# Patient Record
Sex: Female | Born: 1935
Health system: Southern US, Community
[De-identification: ages and names within clinical notes are randomized; demographics above are authoritative.]

## PROBLEM LIST (undated history)

## (undated) DIAGNOSIS — I10 Essential (primary) hypertension: Secondary | ICD-10-CM

## (undated) DIAGNOSIS — E039 Hypothyroidism, unspecified: Secondary | ICD-10-CM

## (undated) DIAGNOSIS — I639 Cerebral infarction, unspecified: Secondary | ICD-10-CM

## (undated) DIAGNOSIS — E785 Hyperlipidemia, unspecified: Secondary | ICD-10-CM

## (undated) DIAGNOSIS — F0390 Unspecified dementia without behavioral disturbance: Secondary | ICD-10-CM

## (undated) DIAGNOSIS — I4891 Unspecified atrial fibrillation: Secondary | ICD-10-CM

## (undated) DIAGNOSIS — H547 Unspecified visual loss: Secondary | ICD-10-CM

## (undated) DIAGNOSIS — S8990XA Unspecified injury of unspecified lower leg, initial encounter: Secondary | ICD-10-CM

## (undated) DIAGNOSIS — I6529 Occlusion and stenosis of unspecified carotid artery: Secondary | ICD-10-CM

## (undated) DIAGNOSIS — C801 Malignant (primary) neoplasm, unspecified: Secondary | ICD-10-CM

## (undated) DIAGNOSIS — K219 Gastro-esophageal reflux disease without esophagitis: Secondary | ICD-10-CM

## (undated) DIAGNOSIS — I4821 Permanent atrial fibrillation: Secondary | ICD-10-CM

## (undated) HISTORY — DX: Malignant (primary) neoplasm, unspecified: C80.1

## (undated) HISTORY — DX: Occlusion and stenosis of unspecified carotid artery: I65.29

## (undated) HISTORY — DX: Essential (primary) hypertension: I10

## (undated) HISTORY — DX: Unspecified visual loss: H54.7

## (undated) HISTORY — PX: ABDOMINAL HYSTERECTOMY: SHX81

## (undated) HISTORY — DX: Unspecified injury of unspecified lower leg, initial encounter: S89.90XA

## (undated) HISTORY — DX: Cerebral infarction, unspecified: I63.9

## (undated) HISTORY — DX: Hypothyroidism, unspecified: E03.9

## (undated) HISTORY — PX: MASTECTOMY, PARTIAL: SHX709

## (undated) HISTORY — DX: Hyperlipidemia, unspecified: E78.5

## (undated) HISTORY — DX: Gastro-esophageal reflux disease without esophagitis: K21.9

---

## 1979-08-16 HISTORY — PX: ROTATOR CUFF REPAIR: SHX139

## 2005-01-28 ENCOUNTER — Ambulatory Visit: Payer: Self-pay | Admitting: Internal Medicine

## 2005-02-04 ENCOUNTER — Ambulatory Visit: Payer: Self-pay | Admitting: Internal Medicine

## 2005-02-04 ENCOUNTER — Ambulatory Visit: Payer: Self-pay | Admitting: Pulmonary Disease

## 2005-02-13 ENCOUNTER — Emergency Department (HOSPITAL_COMMUNITY): Admission: EM | Admit: 2005-02-13 | Discharge: 2005-02-13 | Payer: Self-pay | Admitting: Emergency Medicine

## 2005-02-18 ENCOUNTER — Ambulatory Visit: Payer: Self-pay | Admitting: Cardiology

## 2005-02-18 ENCOUNTER — Ambulatory Visit: Payer: Self-pay | Admitting: Internal Medicine

## 2005-02-23 ENCOUNTER — Ambulatory Visit: Payer: Self-pay | Admitting: Cardiology

## 2005-02-23 ENCOUNTER — Ambulatory Visit: Payer: Self-pay

## 2005-03-02 ENCOUNTER — Ambulatory Visit: Payer: Self-pay | Admitting: Pulmonary Disease

## 2005-03-08 ENCOUNTER — Ambulatory Visit: Payer: Self-pay | Admitting: Pulmonary Disease

## 2005-04-07 ENCOUNTER — Ambulatory Visit: Payer: Self-pay | Admitting: Cardiology

## 2005-04-08 ENCOUNTER — Ambulatory Visit: Payer: Self-pay | Admitting: Cardiology

## 2005-04-08 ENCOUNTER — Ambulatory Visit: Payer: Self-pay | Admitting: Internal Medicine

## 2005-05-20 ENCOUNTER — Ambulatory Visit: Payer: Self-pay | Admitting: Internal Medicine

## 2005-06-03 ENCOUNTER — Ambulatory Visit: Payer: Self-pay | Admitting: Internal Medicine

## 2005-08-09 ENCOUNTER — Ambulatory Visit: Payer: Self-pay | Admitting: Internal Medicine

## 2005-09-02 ENCOUNTER — Ambulatory Visit: Payer: Self-pay | Admitting: Internal Medicine

## 2005-12-14 ENCOUNTER — Ambulatory Visit: Payer: Self-pay | Admitting: Internal Medicine

## 2006-03-01 ENCOUNTER — Ambulatory Visit: Payer: Self-pay | Admitting: Internal Medicine

## 2006-03-24 ENCOUNTER — Ambulatory Visit: Payer: Self-pay | Admitting: Cardiology

## 2006-03-24 ENCOUNTER — Ambulatory Visit: Payer: Self-pay

## 2007-09-25 DIAGNOSIS — E785 Hyperlipidemia, unspecified: Secondary | ICD-10-CM | POA: Insufficient documentation

## 2007-09-25 DIAGNOSIS — I10 Essential (primary) hypertension: Secondary | ICD-10-CM

## 2007-09-25 DIAGNOSIS — I2510 Atherosclerotic heart disease of native coronary artery without angina pectoris: Secondary | ICD-10-CM | POA: Insufficient documentation

## 2008-08-01 DIAGNOSIS — R4182 Altered mental status, unspecified: Secondary | ICD-10-CM

## 2008-08-04 ENCOUNTER — Telehealth: Payer: Self-pay | Admitting: Gastroenterology

## 2008-08-05 ENCOUNTER — Ambulatory Visit: Payer: Self-pay | Admitting: Gastroenterology

## 2008-08-05 ENCOUNTER — Inpatient Hospital Stay (HOSPITAL_COMMUNITY): Admission: EM | Admit: 2008-08-05 | Discharge: 2008-08-09 | Payer: Self-pay | Admitting: Emergency Medicine

## 2008-08-05 DIAGNOSIS — I959 Hypotension, unspecified: Secondary | ICD-10-CM | POA: Insufficient documentation

## 2008-08-05 DIAGNOSIS — R1319 Other dysphagia: Secondary | ICD-10-CM

## 2008-08-06 ENCOUNTER — Ambulatory Visit: Payer: Self-pay | Admitting: Gastroenterology

## 2008-08-09 ENCOUNTER — Encounter (INDEPENDENT_AMBULATORY_CARE_PROVIDER_SITE_OTHER): Payer: Self-pay | Admitting: *Deleted

## 2008-08-11 ENCOUNTER — Telehealth: Payer: Self-pay | Admitting: Gastroenterology

## 2008-08-20 ENCOUNTER — Ambulatory Visit: Payer: Self-pay | Admitting: Gastroenterology

## 2008-10-29 ENCOUNTER — Encounter: Payer: Self-pay | Admitting: Adult Health

## 2009-09-28 ENCOUNTER — Encounter: Admission: RE | Admit: 2009-09-28 | Discharge: 2009-09-28 | Payer: Self-pay | Admitting: Endocrinology

## 2009-10-23 ENCOUNTER — Encounter: Admission: RE | Admit: 2009-10-23 | Discharge: 2009-10-23 | Payer: Self-pay | Admitting: Endocrinology

## 2010-03-01 ENCOUNTER — Emergency Department (HOSPITAL_COMMUNITY): Admission: EM | Admit: 2010-03-01 | Discharge: 2010-03-01 | Payer: Self-pay | Admitting: Emergency Medicine

## 2010-03-09 ENCOUNTER — Ambulatory Visit: Payer: Self-pay | Admitting: Vascular Surgery

## 2010-03-11 ENCOUNTER — Ambulatory Visit: Payer: Self-pay | Admitting: Vascular Surgery

## 2010-03-11 ENCOUNTER — Encounter: Admission: RE | Admit: 2010-03-11 | Discharge: 2010-03-11 | Payer: Self-pay | Admitting: Vascular Surgery

## 2010-03-15 ENCOUNTER — Ambulatory Visit (HOSPITAL_COMMUNITY): Admission: RE | Admit: 2010-03-15 | Discharge: 2010-03-15 | Payer: Self-pay | Admitting: Vascular Surgery

## 2010-03-15 ENCOUNTER — Ambulatory Visit: Payer: Self-pay | Admitting: Vascular Surgery

## 2010-03-15 HISTORY — PX: OTHER SURGICAL HISTORY: SHX169

## 2010-04-08 ENCOUNTER — Encounter: Admission: RE | Admit: 2010-04-08 | Discharge: 2010-04-08 | Payer: Self-pay | Admitting: Endocrinology

## 2010-04-14 ENCOUNTER — Ambulatory Visit: Payer: Self-pay | Admitting: Vascular Surgery

## 2010-09-05 ENCOUNTER — Encounter: Payer: Self-pay | Admitting: Endocrinology

## 2010-09-05 ENCOUNTER — Encounter: Payer: Self-pay | Admitting: Vascular Surgery

## 2010-09-13 ENCOUNTER — Other Ambulatory Visit: Payer: Self-pay | Admitting: Endocrinology

## 2010-09-13 DIAGNOSIS — Z1239 Encounter for other screening for malignant neoplasm of breast: Secondary | ICD-10-CM

## 2010-09-22 ENCOUNTER — Other Ambulatory Visit: Payer: Self-pay

## 2010-09-22 ENCOUNTER — Ambulatory Visit (INDEPENDENT_AMBULATORY_CARE_PROVIDER_SITE_OTHER): Payer: Medicaid Other | Admitting: Vascular Surgery

## 2010-09-22 DIAGNOSIS — I6529 Occlusion and stenosis of unspecified carotid artery: Secondary | ICD-10-CM

## 2010-09-24 NOTE — Assessment & Plan Note (Signed)
OFFICE VISIT  Hill, Joanna Hill DOB:  08/18/1931                                       09/22/2010 [id number]#:[id number]  I saw the patient in the office today for continued follow-up of her carotid disease.  This is a pleasant 77 year old woman who I had seen with a moderate right carotid stenosis in July 2011.  She had a previous left hemispheric stroke in 2009 associated with right-sided weakness. She underwent a cerebral arteriogram on 03/15/2010 which showed only 30% fairly smooth right proximal internal carotid artery stenosis overlying a length of approximately 2-3 cm.  There was no significant left internal carotid artery disease and both vertebral arteries were patent. I did not think carotid endarterectomy was recommended given that this was fairly mild stenosis.  Since I saw her last she has had no new neurologic symptoms.  She has persistent expressive aphasia which was her presenting symptom in 02/2010.  She is ambulatory with assistance. She had no new weakness or paresthesias.  She does complain of some occasional headaches.  She had tried what sounds like acupuncture for this which helped temporarily.  SOCIAL HISTORY:  She is widowed.  She has three children.  She does not use tobacco.  REVIEW OF SYSTEMS:  CARDIOVASCULAR:  She had no chest pain, chest pressure, palpitations or arrhythmias. PULMONARY:  She had no productive cough bronchitis, asthma or wheezing.  PHYSICAL EXAMINATION:  This is a pleasant 77 year old woman who appears her stated age.  Heart rate is 78.  Blood pressure 102/66, saturation 95%.  Lungs:  Clear bilaterally to auscultation without rales, rhonchi or wheezing.  Cardiovascular:  I do not detect any carotid bruits.  She has a regular rate and rhythm.  Both feet are warm and well-perfused. She has mild bilateral lower extremity swelling.  Abdomen:  Soft and nontender with normal pitched bowel sounds.  Neurologic:  She has  mild right upper extremity and right lower extremity weakness which has been stable.  She has persistent expressive aphasia.  Skin:  There are no ulcers or rashes.  I have independently interpreted her carotid duplex scan which shows a 40%-59% right carotid stenosis with no significant stenosis on the left. Both vertebral arteries are patent with normally directed flow.  I have compared her velocities today to the velocities from 02/2010 on duplex and there has been no significant change on the right.  I have explained I would not consider carotid endarterectomy unless the stenosis progressed to greater than 80% and will simply continue to follow her with routine duplex scans.  I will plan on seeing her back in 1 year.  She knows to call sooner if she has problems.    Di Kindle. Edilia Bo, M.D. Electronically Signed  CSD/MEDQ  D:  09/22/2010  T:  09/23/2010  Job:  [id number]  cc:   Melvyn Novas, M.D. Tera Mater. Evlyn Kanner, M.D.

## 2010-09-29 ENCOUNTER — Ambulatory Visit
Admission: RE | Admit: 2010-09-29 | Discharge: 2010-09-29 | Disposition: A | Discharge status: Home / Self Care | Payer: Medicare Other | Source: Ambulatory Visit | Attending: Endocrinology | Admitting: Endocrinology

## 2010-09-29 DIAGNOSIS — Z1239 Encounter for other screening for malignant neoplasm of breast: Secondary | ICD-10-CM

## 2010-09-29 NOTE — Procedures (Unsigned)
CAROTID DUPLEX EXAM  INDICATION:  Carotid stenosis.  HISTORY: Diabetes:  Yes. Cardiac:  Murmur, atrial fibrillation. Hypertension:  Yes. Smoking:  No. Previous Surgery:  No. CV History:  CVA 02/2008, possible CVA 02/2010. Amaurosis Fugax No, Paresthesias No, Hemiparesis No.                                      RIGHT             LEFT Brachial systolic pressure:         Not taken, per patient              107 Brachial Doppler waveforms:                           Normal Vertebral direction of flow:        Antegrade         Antegrade DUPLEX VELOCITIES (cm/sec) CCA peak systolic                   43                50 ECA peak systolic                   66                47 ICA peak systolic                   180               45 ICA end diastolic                   47                20 PLAQUE MORPHOLOGY:                  Heterogenous      Heterogenous PLAQUE AMOUNT:                      Moderate          Mild PLAQUE LOCATION:                    ICA               Bulb  IMPRESSION: 1. Right internal carotid artery velocities suggest 40% to 59%     stenosis. 2. Left internal carotid artery velocities suggest 1% to 39% stenosis. 3. Antegrade flow in bilateral vertebral arteries.  ___________________________________________ Di Kindle. Edilia Bo, M.D.  EM/MEDQ  D:  09/22/2010  T:  09/22/2010  Job:  [id number]

## 2010-10-13 ENCOUNTER — Other Ambulatory Visit: Payer: Self-pay

## 2010-10-13 ENCOUNTER — Ambulatory Visit: Payer: Self-pay | Admitting: Vascular Surgery

## 2010-10-29 LAB — POCT I-STAT, CHEM 8
Calcium, Ion: 1.19 mmol/L (ref 1.12–1.32)
HCT: 40 % (ref 36.0–46.0)
Hemoglobin: 13.6 g/dL (ref 12.0–15.0)
Sodium: 143 mEq/L (ref 135–145)
TCO2: 26 mmol/L (ref 0–100)

## 2010-10-29 LAB — GLUCOSE, CAPILLARY
Glucose-Capillary: 145 mg/dL — ABNORMAL HIGH (ref 70–99)
Glucose-Capillary: 94 mg/dL (ref 70–99)

## 2010-10-29 LAB — PROTIME-INR
INR: 1.43 (ref 0.00–1.49)
Prothrombin Time: 17.3 seconds — ABNORMAL HIGH (ref 11.6–15.2)

## 2010-10-29 LAB — APTT: aPTT: 29 seconds (ref 24–37)

## 2010-10-30 LAB — COMPREHENSIVE METABOLIC PANEL
AST: 19 U/L (ref 0–37)
Albumin: 4.2 g/dL (ref 3.5–5.2)
BUN: 21 mg/dL (ref 6–23)
CO2: 28 mEq/L (ref 19–32)
Calcium: 9.5 mg/dL (ref 8.4–10.5)
Creatinine, Ser: 0.94 mg/dL (ref 0.4–1.2)
GFR calc Af Amer: >60 mL/min (ref >60)
GFR calc non Af Amer: 58 mL/min — ABNORMAL LOW (ref >60)
Total Bilirubin: 0.8 mg/dL (ref 0.3–1.2)

## 2010-10-30 LAB — DIFFERENTIAL
Basophils Absolute: 0 10*3/uL (ref 0.0–0.1)
Eosinophils Relative: 3 % (ref 0–5)
Lymphocytes Relative: 19 % (ref 12–46)
Lymphs Abs: 1.4 10*3/uL (ref 0.7–4.0)
Neutro Abs: 5 10*3/uL (ref 1.7–7.7)

## 2010-10-30 LAB — APTT: aPTT: 32 seconds (ref 24–37)

## 2010-10-30 LAB — URINALYSIS, ROUTINE W REFLEX MICROSCOPIC
Bilirubin Urine: NEGATIVE
Ketones, ur: NEGATIVE mg/dL
Leukocytes, UA: NEGATIVE
Nitrite: NEGATIVE
Protein, ur: NEGATIVE mg/dL
Urobilinogen, UA: 0.2 mg/dL (ref 0.0–1.0)
pH: 6 (ref 5.0–8.0)

## 2010-10-30 LAB — URINE CULTURE
Colony Count: NO GROWTH
Culture: NO GROWTH

## 2010-10-30 LAB — CBC
Platelets: 183 10*3/uL (ref 150–400)
RBC: 4.3 MIL/uL (ref 3.87–5.11)
RDW: 16.7 % — ABNORMAL HIGH (ref 11.5–15.5)
WBC: 7.2 10*3/uL (ref 4.0–10.5)

## 2010-10-30 LAB — TROPONIN I: Troponin I: 0.02 ng/mL (ref 0.00–0.06)

## 2010-10-30 LAB — PROTIME-INR
INR: 1.87 — ABNORMAL HIGH (ref 0.00–1.49)
Prothrombin Time: 21.4 seconds — ABNORMAL HIGH (ref 11.6–15.2)

## 2010-10-30 LAB — URINE MICROSCOPIC-ADD ON

## 2010-10-30 LAB — CK TOTAL AND CKMB (NOT AT ARMC)
CK, MB: 0.8 ng/mL (ref 0.3–4.0)
Relative Index: INVALID (ref 0.0–2.5)

## 2010-12-28 NOTE — Procedures (Signed)
CAROTID DUPLEX EXAM   INDICATION:  Droop in left face, slurred speech.   HISTORY:  Diabetes:  Yes.  Cardiac:  Murmur, A fib.  Hypertension:  Yes.  Smoking:  No.  Previous Surgery:  No.  CV History:  CVA in July 2009, questionable CVA present.  Amaurosis Fugax No, Paresthesias No, Hemiparesis Yes.                                       RIGHT             LEFT  Brachial systolic pressure:         Not taken, per patient              112  Brachial Doppler waveforms:                           WNL  Vertebral direction of flow:        Antegrade         Antegrade  DUPLEX VELOCITIES (cm/sec)  CCA peak systolic                   57                60  ECA peak systolic                   71                91  ICA peak systolic                   168               54  ICA end diastolic                   56                18  PLAQUE MORPHOLOGY:                  Heterogenous      Heterogenous  PLAQUE AMOUNT:                      Moderate          Mild  PLAQUE LOCATION:                    ICA               Bulb   IMPRESSION:  1. Right internal carotid artery suggests 40% to 59% stenosis.  2. Left internal carotid artery suggests 1% to 39% stenosis.  3. Antegrade flow in bilateral vertebrals.   ___________________________________________  Di Kindle. Edilia Bo, M.D.   CB/MEDQ  D:  03/09/2010  T:  03/09/2010  Job:  [id number]

## 2010-12-28 NOTE — Assessment & Plan Note (Signed)
OFFICE VISIT   Hill, Joanna N  DOB:  1931/10/04                                       04/14/2010  EAVWU#:[id number]   I saw the patient in the office today for followup after her recent  cerebral arteriogram.  This is a pleasant 77 year old woman who I had  seen in consultation on 03/11/2010 with a moderate right carotid  stenosis.  She had had a previous left hemispheric stroke 2 years ago  associated with right-sided weakness.  She was having problems with  expressive aphasia and some left facial droop.  She had been at [facility] in July and was evaluated by Dr. Vickey Huger at that time.  CT  scan at that time showed an old left middle cerebral artery stroke but  no acute changes.  Carotid duplex scan suggested a moderate 40%-59%  right carotid stenosis.  I had recommended cerebral arteriography to  further evaluate the right carotid stenosis and to consider right  carotid endarterectomy.   She had her cerebral arteriogram on 03/15/2010.  I found a mild 30%  right proximal internal carotid artery stenosis over a length of 2-3 cm.  There was no left internal carotid artery stenosis and the vertebral  arteries were both patent.  I did not think that the stenosis on the  right warranted consideration for carotid endarterectomy.  She comes in  for a followup visit.  She states that she has been having persistent  left-sided headaches now for several weeks.  She has had no associated  nausea and vomiting.  The headache symptoms have not changed in  character over the last 2 weeks.   REVIEW OF SYSTEMS:  She has had no chest pain, chest pressure,  palpitations or arrhythmias.   PHYSICAL EXAMINATION:  General:  This is a pleasant 77 year old woman  who appears her stated age.  Vital signs:  Blood pressure is 94/63,  heart rate is 96, temperature is 98.4.  Lungs:  Are clear bilaterally to  auscultation.  Cardiovascular:  I do not detect any carotid bruits.   She  has an irregular rhythm.  Neurologic:  She has some expressive aphasia.  She is nonambulatory.  Her cath site on the right looks fine with no  hematoma.   I have reviewed results of her cerebral arteriogram with her.  I  explained that I did not think the mild stenosis on the right is  significant at this point.  I have recommended a followup carotid duplex  scan in 6 months and I have ordered this test.  Given her persistent  headaches we will try to get her an appointment to see Dr. Vickey Huger to  consider further workup for this.  I will plan on seeing her back in 6  months.     Di Kindle. Edilia Bo, M.D.  Electronically Signed   CSD/MEDQ  D:  04/14/2010  T:  04/15/2010  Job:  [id number]   cc:   Melvyn Novas, M.D.  Tera Mater. Evlyn Kanner, M.D.

## 2010-12-28 NOTE — H&P (Signed)
NAME:  Joanna Hill, Joanna Hill NO.:  [id number]   MEDICAL RECORD NO.:  [id number]          PATIENT TYPE:  INP   LOCATION:  1402                         FACILITY:  [facility]   PHYSICIAN:  Geoffry Paradise, M.D.  DATE OF BIRTH:  [birth year]   DATE OF ADMISSION:  08/05/2008  DATE OF DISCHARGE:                              HISTORY & PHYSICAL   CHIEF COMPLAINT:  Sent over from [facility]; with low blood pressure and  mental status changes.   HISTORY OF PRESENT ILLNESS:  Joanna Hill is a 77 year old white female who  has been a resident at [facility] since  the summer, after having sustained a left MCA infarction, for rehab.  She has multiple medical illnesses to include: chronic atrial  fibrillation and DVT on chronic Coumadin, hypertension, diabetes  mellitus type 2, hypothyroidism, hyperlipidemia, osteoporosis and  depression.  She has had outstanding progress at [facility], to the  extent that she is semi-ambulatory, quite happy there and very active in  all rehab restorative and activity modalities.  She evidently has been  in baseline excellent health, seen daily by her 3 children.  Some time 7-  10 days ago she began having more pain, particularly her mid back; which  has been present since her stroke and fall, as well as some left neck  and head -- resulting in the initiation of some Duragesic 25 mcg.  Her  children said she tolerated this quite well, but this was discontinued 1  day prior because of questionable mental status changes.  Additionally,  over the weekend she began having progressive dysphagia and odynophagia,  both with solids and liquids; but able to take in liquids and some fluid  well and control her own saliva.  She was expeditiously seen by   GI today, but upon presenting to see Dr. Christella Hartigan she was found to have a  systolic pressure in the 80s transiently; and with this in conjunction  with the history of possible mental  status changes was sent to the  emergency room.  Upon arrival in the emergency room her blood pressures  ranged at 105/54 to 140s/90s with no hypotension.  Upon arrival in the  emergency room as well she has mentally totally clear; but the GI  symptoms continue with some questionable poor p.o. intake.  She is  admitted at this time for further GI evaluation, IV fluids and further  evaluation of possible hypotension/mental status changes.   PAST MEDICAL HISTORY:   ALLERGIES:  NO KNOWN DRUG ALLERGIES.   MEDICATIONS:  1. NovoLog sliding scale a.c. only at the facility; 2 units for 120-      150, 4 units anything over 151.  2. Levemir 20 units subcutaneous at bedtime.  3. Tylenol 650 p.o. q.4 h. p.r.n. pain.  4. Duragesic she was on has been discontinued.  She had only received      2 patches total.  5. Fosamax 70 mg weekly on Sunday.  6. Norvasc 10 mg daily.  7. Atacand 32 mg daily.  8. Lasix 20 p.o. daily.  9. Lexapro 20 p.o. daily.  10.Lopressor 25 mg three p.o. in the morning and four p.o. at night.  11.Os-Cal D one daily.  12.Provigil 100 mg daily.  This was discontinued by Dr. Evlyn Kanner several      weeks ago.  13.Synthroid 50 mcg daily.  14.Coumadin per pharmacy protocol, most recently 2.5 mg daily.  15.Peridex oral rinse twice daily.  16.Lipitor 20 mg daily.   DIET:  Is a regular.  Thin liquids; no concentrated sweets.   MEDICAL ILLNESSES:  Include:  1. Hypothyroidism.  2. Essential hypertension.  3. Diabetes mellitus type 2.  4. Atrial fibrillation, on chronic Coumadin.  5. Hyperlipidemia.  6. Osteoporosis.  7. Chronic depression.  8. DVT.  9. She additionally has carotid disease; right 60-79% , left was      clear.   She has had an echocardiogram revealing pulmonary hypertension with EF  50%.   PHYSICAL EXAMINATION:  Temperature 98, blood pressure at this time is  143/96, pulse 80 irregular, respiratory rate 20, O2 saturation 98% on  room air.  The patient is  delightful, awake, alert, no distress,  smiling.  HEENT:  Anicteric.  Mild facial asymmetry right droop, lower face only.  Oropharynx benign.  NECK:  No JVD or bruits.  LUNGS:  Clear to auscultation and percussion.  CARDIOVASCULAR:  Irregular rate and rhythm.  No murmur, gallop, rub or  heave.  ABDOMEN:  Soft, nontender.  No hepatosplenomegaly.  EXTREMITIES:  No cyanosis, clubbing.  Trace edema of right upper  extremity and right lower extremity.  Intact distal pulses.  Joints  normal.  NEUROLOGICAL:  She is awake, alert, oriented x4 tonight; bright  and interactive.  She does have a mild right hemiparesis, 4/5; left  intact.  Gait not assessed.   DATA:  Chest x-ray, no acute disease.  CT brain:  Atrophy, no bleed.  Chemistries, CBC and urine baseline.   ASSESSMENT:  1. Subacute/acute GI symptoms, dysphagia and odynophagia:  Primary      considerations would be a candidal esophagitis or acid peptic      esophagitis/gastritis.  Remote possibility of malignancy exists  2. Transient hypotension/mental status changes:  No evidence of      infection or new CNS event.  I believe this may indeed be more      vasovagal and/or narcotic related.  3. Chronic atrial fibrillation:  Controlled response on chronic      Coumadin.  4. Hypertension.  5. Diabetes mellitus type 2.  6. Chronic depression.   PLAN:  The patient be admitted and hydrated.  GI will be consulted in  the morning.  I will order a barium swallow with upper GI series, and  will empirically treat with Magic mouthwash and proton pump inhibitors.  I am transiently holding Coumadin, pending the need for endoscopy and or  dilatation further pending her course.           ______________________________  Geoffry Paradise, M.D.     RA/MEDQ  D:  08/05/2008  T:  08/05/2008  Job:  [id number]   cc:   Rachael Fee, MD  [address]  [place], [place] [address]

## 2010-12-28 NOTE — Discharge Summary (Signed)
NAME:  Joanna Hill, Joanna Hill NO.:  [id number]   MEDICAL RECORD NO.:  [id number]          PATIENT TYPE:  INP   LOCATION:  1402                         FACILITY:  [facility]   PHYSICIAN:  Geoffry Paradise, M.D.  DATE OF BIRTH:  1932-01-17   DATE OF ADMISSION:  08/05/2008  DATE OF DISCHARGE:  08/09/2008                               DISCHARGE SUMMARY   DISCHARGE DIAGNOSES:  1. Esophagitis distal esophagus, acid peptic versus Candida.  2. Poor p.o. intake secondary to #1.  3. Transient hypotension secondary to vasovagal spell with      transportation.  4. Mental status changes secondary to narcotic, cleared.  5. Diabetes mellitus type 2, insulin-dependent.  6. Chronic atrial fibrillation on Coumadin.  7. Primary hypothyroidism.  8. Hyperlipidemia.  9. Osteoporosis with lumbar compression fractures, most likely dating      back to her stroke this past summer.   HISTORY OF PRESENT ILLNESS:  Joanna Hill is a 77 year old white female,  resident of [facility], who has chronic A  fib on Coumadin, left MCA infarct with right hemiparesis dating back to  summer, chronic back pain since summer, diabetes mellitus type 2,  hypothyroidism, hyperlipidemia presenting at this time sent over from  [facility] with low blood pressure and mental status changes.  Her main  acute event was several days of dysphasia leading up to poor p.o. intake  and a resultant [facility] consultation which was really fairly timely  after only several days of symptoms.  Concomitant with this was some  mental status changes which seemed to correspond to the institution of a  low dose Duragesic patch because of some left neck and facial pain, as  well as back pain which dates back several months.  Patch had already  been removed and upon presentation to the  GI, she had some  transient hypotension with systolic blood pressure in the 80s.  Blood  pressure at the facility prior to  transportation was in the low 100  range, which is her baseline and blood pressure in the emergency room  was normal.  Upon my evaluation in the emergency room she was clear and  appropriate.  She clearly has significant odynophagia and dysphagia with  poor p.o. intake and is admitted for further management.  For details,  see the dictated summary in the chart.   DATA:  Lumbar spine series reveals, as well as thoracic spine series  reveals compression fractures L1-L3.  CT scan of the brain, posterior  left frontal, left parietal encephalomalacia.  No acute features.  Atrophy and chronic ischemic changes.  Chest x-ray:  No acute  cardiopulmonary disease.  Her INR was 1.9 prior to discharge.  CBCs have  been ranging mid to upper 100s.  Chemistry:  Sodium 141, potassium 4.2,  chloride 103, CO2 of 31, glucose 137, BUN 24, creatinine 0.97.  Liver  function testing was completely normal.  Ammonia was normal at 16.  Urinalysis was negative.  Brushings of the esophagus revealed no yeast  or fungus.  CBC:  Hemoglobin 12.7, hematocrit 39,  white blood cell count  8.6, platelet count 184,000.   PLAN:  The patient was admitted, hydrated.  From a mental status  standpoint and cardiopulmonary standpoint, all hemodynamics remained  stable and were stable upon presentation.  Duragesic patch was obviously  not continued and Tylenol used only for pain.  GI was consulted and she  did undergo upper GI endoscopy which was performed without difficulty on  August 06, 2008.  This revealed esophagitis, distal esophagus.  Brushings and washes were negative.  She was treated empirically with  proton pump inhibitors, Magic mouth wash and Diflucan and has been  gradually improving, thus she has been eating 50-70% of all p.o. intake.  She is bright, alert, stable blood pressure, INR therapeutic.  She and  family wish transportation back to the facility.   DIET:  Regular no concentrated sweet diet.    MEDICATIONS:  1. NovoLog insulin sliding scale as before at the facility, 2 units      a.c. 120-150, four units anything over 151.  Levemir has been      lowered to 15 units nightly.  2. Tylenol 650 p.o. q.4 h., p.r.n. pain.  3. Norvasc 10 mg daily.  4. Atacand 32 mg daily.  5. Lasix 20 mg daily.  6. Lexapro 20 mg daily.  7. Os-Cal D 1 daily.  8. Synthroid 50 mcg daily.  9. Coumadin per pharmacy protocol 2.5 daily.  10.Lipitor 20 mg daily.  11.Bystolic 10 mg daily is new.  This has been substituted for the      Lopressor because of some drowsiness in hopes of avoiding further      Provigil.  She is not on Provigil as well and the Fosamax has been      held and discontinued as well.  12.Magic mouth wash 10 mL q.i.d. x5 days.  13.Diflucan 100 daily x7 days.  14.Omeprazole 20 mg b.i.d. x1 month and daily thereafter.   She will require OT and PT consults.  She is discharged in improved and  stable condition.  The only acute finding was the esophagitis.  I  believe that the transient drop in blood pressure was related to  transportation and a vasovagal episode.  No underlying cardiac or  infectious disease etiology was found.  Mental status changes stated  above were probably related to the Duragesic patch and family I have  agreed on trying Tylenol alone at this point as she is overly sensitive  to narcotics.  She is not a candidate for vertebroplasty as symptoms are  mild at best.           ______________________________  Geoffry Paradise, M.D.     RA/MEDQ  D:  08/09/2008  T:  08/09/2008  Job:  [id number]

## 2010-12-28 NOTE — Consult Note (Signed)
NEW PATIENT CONSULTATION   Joanna Hill, Joanna Hill  DOB:  10-Aug-1932                                       03/11/2010  [id number]#:[id number]   I saw the patient in the office today in consultation concerning a  moderate right carotid stenosis.  She was referred by Dr. Evlyn Kanner.  This  is a pleasant 76 year old right-handed woman who sustained a left  hemispheric stroke 2 years ago which was associated with significant  right-sided weakness.  This weakness has gradually improved over the  last 2 years but has not completely resolved.  She states that she has  been nonambulatory since her stroke 2 years ago because of poor  coordination with her right leg.  She had been doing well otherwise  until approximately 2 weeks ago when she noted the gradual onset of a  left occipital headache.  The pain is of moderate severity and comes and  goes.  There are no aggravating factors and no alleviating factors.  Over the last 2 weeks the pain has not changed significantly in  character.  She has no associated symptoms.  Specifically she has had no  nausea or vomiting or change in vision.  She denies any history of  amaurosis fugax.  The patient had actually been seen at [facility] on  July 18 and was evaluated by Dr. Vickey Huger at that time.  The CT scan of  the head at that time showed an old left middle cerebral artery stroke  but no acute changes.  There was possibly, however, a ring enhancing  lesion noted by Dr. Vickey Huger in the central occipital lobe and on the  right posterior temporal lobe.  This prompted an MRI of the head which  showed no acute abnormality.  There was some atrophy and chronic  microvascular ischemia.  She also underwent an EEG.  This study showed  asymmetry in the occipital dominant rhythm, left versus right as noted  by Dr. Vickey Huger.  This asymmetry would be considered abnormal and could  result from an occipital hematoma or stroke.   She was sent to our office 2  days ago and had a carotid duplex scan  which showed a moderate right carotid stenosis and we were asked to  evaluate her concerning these results.   This patient in addition to the headache on the left has noted slurred  speech which began 2 weeks ago at the same time.  This has been  constant.  In addition she has noted some left facial drooping which has  been constant.   Her past medical history is significant for previous stroke as described  above.  In addition she has a history of diabetes, hypertension,  hypercholesterolemia and chronic atrial fibrillation.  She denies any  history of previous myocardial infarction or history of congestive heart  failure or COPD.   SOCIAL HISTORY:  She is widowed.  She has three children.  She does not  use tobacco.   REVIEW OF SYSTEMS:  CARDIOVASCULAR:  She has had no chest pain or chest  pressure.  She does admit to orthopnea and dyspnea on exertion.  She has  atrial fibrillation.  She is nonambulatory.  She has had no history of  nonhealing wounds in her feet.  GI:  She has had problems swallowing since her stroke 2 years ago.  NEUROLOGIC:  She has had headaches in the left occipital region as  described above.  She has had no dizziness, blackouts or seizures.  General, pulmonary, hematologic, urinary, ENT, musculoskeletal,  psychiatric, integumentary review of systems is unremarkable and is  documented on the medical history form in the chart.   CURRENT MEDICATIONS:  Include Norvasc, Bystolic, Diovan, Lasix,  Synthroid, Lexapro, Lidoderm patch, Prilosec, Singulair, K-Dur, Os-Cal  and Coumadin.   PHYSICAL EXAMINATION:  General:  This is a pleasant 77 year old woman  who appears her stated age.  Vital signs:  Her blood pressure is 116/68,  heart rate is 86, respiratory rate 16.  HEENT:  Pupils are equal, round  and reactive to light and accommodation.  Extraocular motions are  intact.  She has some facial drooping on the left side.   Conjunctivae  are normal.  Lungs:  Clear bilaterally to auscultation without rales,  rhonchi or wheezing.  Cardiovascular:  I do not detect any carotid  bruits.  She has had an irregular rhythm.  She has palpable femoral  pulses although the right femoral pulse is slightly more difficult to  palpate.  She has a palpable right dorsalis pedis pulse.  I cannot  palpate pedal pulses on the left.  However, both feet are warm and well-  perfused without ischemic ulcers.  She has a left popliteal pulse.  She  has mild bilateral lower extremity swelling and moderate swelling in the  right arm.  She has had a previous right mastectomy.  Abdomen:  Soft and  nontender with normal pitched bowel sounds.  No masses are appreciated.  I cannot appreciate an aneurysm although it is difficult to assess  because of her size.  Musculoskeletal:  There are no major deformities  or cyanosis.  Neurological:  She has some mild weakness to plantar and  dorsiflexion on the right foot and mild weakness to the quadriceps and  hamstrings on the right.  She has mild grip weakness on the right.  She  has some expressive aphasia and some facial drooping on the left side.  Skin:  There are no ulcers or rashes.  Lymphatic:  She has no  significant cervical, axillary or inguinal lymphadenopathy.  She has  moderate lymphedema of the right upper extremity.   I have independently interpreted her carotid duplex scan which shows a  moderate right carotid stenosis in the 40% to 59% range.  There is no  significant left carotid stenosis.  Both vertebral arteries are patent  with normally directed flow.  I have also reviewed her CT scan and MRI  as described above.   This patient presents with a moderate 40% to 59% right carotid stenosis  and given the left facial droop what sounds like potentially a right  hemispheric stroke although this would not necessarily explain her  expressive aphasia.  She has had persistent headaches  which have not  improved over the last 2 weeks.  I have recommended that she have a  followup CT scan today to see if there has been any change compared to  her previous scan, especially given the EEG findings.  I also  recommended we proceed with cerebral arteriography to further evaluate  this moderate right carotid stenosis.  We will have to hold her Coumadin  prior to the procedure.  I have explained that if we found a smooth 40%  right carotid stenosis then I do not think there would be any role for  carotid endarterectomy.  If she had  a tight stenosis on the right or  significant irregularity she could be considered for right carotid  endarterectomy although her symptoms are somewhat puzzling given that  she has this expressive aphasia and left facial droop.  Pending the  results of her cerebral arteriogram I will likely have her see Dr.  Vickey Huger before making a final decision about the need for a right  carotid endarterectomy.  Will hold her Coumadin prior to the procedure  and start it after.  We will try to get her CT scan done today if  possible.     Di Kindle. Edilia Bo, M.D.  Electronically Signed   CSD/MEDQ  D:  03/11/2010  T:  03/11/2010  Job:  4   cc:   Jeannett Senior A. Evlyn Kanner, M.D.  Melvyn Novas, M.D.

## 2010-12-31 NOTE — Assessment & Plan Note (Signed)
[facility]                              CARDIOLOGY OFFICE NOTE   NAME:DAVISRobie, Oats                         MRN:          [id number]  DATE:03/24/2006                            DOB:          03/24/1932    Ms. Brinker comes in today for stress Myoview.  Preliminary results show an EF  of 73% with good perfusion.   She has had some palpitations and she had a severe episode of palpitations  recently with some chest tightness.   She is on an excellent medical program.  We know she has had palpitations in  the past and PACs, PVCs were even noted on her stress test today.   Her blood pressure is 118/68.  Her pulse is 68 and regular.  She is in no  acute distress.  She says she has lost about 10 pounds but we have no weight  today.  The rest of her exam is unchanged.   ASSESSMENT/PLAN:  1. Continue with her current medications.  Hopefully the Beta-blocker will      alleviate as much as the palpitations as possible.  If she begins to      have more problems in the future we need to re-investigate whether or      not she has atrial fibrillation.                               Thomas C. Daleen Squibb, MD, [facility]    TCW/MedQ  DD:  03/24/2006  DT:  03/24/2006  Job #:  [id number]   cc:   Barbette Hair. Artist Pais, DO

## 2011-01-05 ENCOUNTER — Other Ambulatory Visit: Payer: Self-pay

## 2011-01-05 ENCOUNTER — Ambulatory Visit: Payer: Self-pay | Admitting: Vascular Surgery

## 2011-05-20 LAB — GLUCOSE, CAPILLARY
Glucose-Capillary: 138 mg/dL — ABNORMAL HIGH (ref 70–99)
Glucose-Capillary: 148 mg/dL — ABNORMAL HIGH (ref 70–99)
Glucose-Capillary: 158 mg/dL — ABNORMAL HIGH (ref 70–99)
Glucose-Capillary: 168 mg/dL — ABNORMAL HIGH (ref 70–99)
Glucose-Capillary: 191 mg/dL — ABNORMAL HIGH (ref 70–99)
Glucose-Capillary: 191 mg/dL — ABNORMAL HIGH (ref 70–99)
Glucose-Capillary: 262 mg/dL — ABNORMAL HIGH (ref 70–99)

## 2011-05-20 LAB — BASIC METABOLIC PANEL
CO2: 31 mEq/L (ref 19–32)
Calcium: 10.1 mg/dL (ref 8.4–10.5)
Creatinine, Ser: 0.97 mg/dL (ref 0.4–1.2)
GFR calc Af Amer: >60 mL/min (ref >60)
GFR calc non Af Amer: 56 mL/min — ABNORMAL LOW (ref >60)
Sodium: 141 mEq/L (ref 135–145)

## 2011-05-20 LAB — PROTIME-INR
INR: 1.5 (ref 0.00–1.49)
INR: 1.7 — ABNORMAL HIGH (ref 0.00–1.49)
INR: 1.9 — ABNORMAL HIGH (ref 0.00–1.49)
Prothrombin Time: 18.8 seconds — ABNORMAL HIGH (ref 11.6–15.2)

## 2011-05-20 LAB — DIFFERENTIAL
Basophils Absolute: 0 10*3/uL (ref 0.0–0.1)
Basophils Relative: 0 % (ref 0–1)
Lymphocytes Relative: 23 % (ref 12–46)
Monocytes Absolute: 0.8 10*3/uL (ref 0.1–1.0)
Monocytes Relative: 9 % (ref 3–12)
Neutro Abs: 5.6 10*3/uL (ref 1.7–7.7)
Neutrophils Relative %: 65 % (ref 43–77)

## 2011-05-20 LAB — CBC
Hemoglobin: 12.7 g/dL (ref 12.0–15.0)
MCHC: 32.5 g/dL (ref 30.0–36.0)
RBC: 4.35 MIL/uL (ref 3.87–5.11)

## 2011-05-20 LAB — HEPATIC FUNCTION PANEL
AST: 15 U/L (ref 0–37)
Albumin: 3.5 g/dL (ref 3.5–5.2)
Total Protein: 6.1 g/dL (ref 6.0–8.3)

## 2011-05-20 LAB — URINALYSIS, ROUTINE W REFLEX MICROSCOPIC
Hgb urine dipstick: NEGATIVE
Nitrite: NEGATIVE
Specific Gravity, Urine: 1.012 (ref 1.005–1.030)
Urobilinogen, UA: 0.2 mg/dL (ref 0.0–1.0)
pH: 5.5 (ref 5.0–8.0)

## 2011-05-20 LAB — AMMONIA: Ammonia: 16 umol/L (ref 11–35)

## 2011-05-20 LAB — FUNGUS CULTURE W SMEAR

## 2011-09-05 ENCOUNTER — Other Ambulatory Visit: Payer: Self-pay | Admitting: Endocrinology

## 2011-09-05 DIAGNOSIS — Z1231 Encounter for screening mammogram for malignant neoplasm of breast: Secondary | ICD-10-CM

## 2011-09-05 DIAGNOSIS — Z9011 Acquired absence of right breast and nipple: Secondary | ICD-10-CM

## 2011-09-23 ENCOUNTER — Encounter: Payer: Self-pay | Admitting: Vascular Surgery

## 2011-09-27 ENCOUNTER — Encounter: Payer: Self-pay | Admitting: Vascular Surgery

## 2011-09-28 ENCOUNTER — Ambulatory Visit (INDEPENDENT_AMBULATORY_CARE_PROVIDER_SITE_OTHER): Payer: Medicare Other | Admitting: Vascular Surgery

## 2011-09-28 ENCOUNTER — Other Ambulatory Visit (INDEPENDENT_AMBULATORY_CARE_PROVIDER_SITE_OTHER): Payer: Medicare Other | Admitting: *Deleted

## 2011-09-28 ENCOUNTER — Encounter: Payer: Self-pay | Admitting: Vascular Surgery

## 2011-09-28 VITALS — BP 94/58 | HR 73 | Resp 16 | Ht 64.0 in | Wt 178.0 lb

## 2011-09-28 DIAGNOSIS — I6529 Occlusion and stenosis of unspecified carotid artery: Secondary | ICD-10-CM

## 2011-09-28 NOTE — Assessment & Plan Note (Signed)
This patient has a moderate 40-59% right carotid stenosis. She has no stenosis on the left. We would not consider right carotid endarterectomy unless the stenosis progressed to greater than 80% or she became symptomatic. She has been on Coumadin since her stroke in 2009 and is not on aspirin. I've ordered a follow up carotid duplex scan in 1 year and I'll see her back at that time. She knows to call sooner if she has problems.

## 2011-09-28 NOTE — Progress Notes (Signed)
[facility]  Patient name: [name] MRN: [id number] DOB: [birth year] Sex: female  REASON FOR VISIT: follow up of right carotid stenosis  HPI: Joanna Hill is a 78 y.o. female who I been following with a moderate right carotid stenosis. She had a left hemispheric stroke 3 years ago. She has no significant carotid stenosis on the left. I last saw her in February 2012. Since I saw her last she's had no new onset weakness or paresthesias. She's had some persistent expressive aphasia. She's had no amaurosis fugax.  Of note she did undergo a cerebral arteriogram 2011 which showed a smooth 30% right proximal internal carotid artery stenosis with no significant stenosis on the left which is the side she had a stroke on.  Past Medical History  Diagnosis Date  . Carotid artery occlusion   . Diabetes mellitus   . Hypertension   . Hyperlipidemia   . Atrial fibrillation   . Blindness   . Stroke 2009    difficulty with swallowing  . Cancer     breast    History reviewed. No pertinent family history.  SOCIAL HISTORY: History  Substance Use Topics  . Smoking status: Never Smoker   . Smokeless tobacco: Not on file  . Alcohol Use: No    No Known Allergies  Current Outpatient Prescriptions  Medication Sig Dispense Refill  . acetaminophen (TYLENOL) 325 MG tablet Take 650 mg by mouth every 6 (six) hours as needed.      Marland Kitchen albuterol (PROVENTIL) (2.5 MG/3ML) 0.083% nebulizer solution Take 2.5 mg by nebulization every 6 (six) hours as needed.      Marland Kitchen amLODipine (NORVASC) 10 MG tablet Take 10 mg by mouth daily.      . calcium carbonate (TUMS - DOSED IN MG ELEMENTAL CALCIUM) 500 MG chewable tablet Chew 1 tablet by mouth daily.      . citalopram (CELEXA) 40 MG tablet Take 40 mg by mouth daily.      Marland Kitchen docusate sodium (COLACE) 100 MG capsule Take 100 mg by mouth 2 (two) times daily.      . fluticasone (FLONASE) 50 MCG/ACT nasal spray Place 2 sprays into the nose daily.       . furosemide (LASIX) 40 MG tablet Take 40 mg by mouth daily.      Marland Kitchen gabapentin (NEURONTIN) 100 MG capsule Take 300 mg by mouth 3 (three) times daily.      . insulin glargine (LANTUS) 100 UNIT/ML injection Inject into the skin at bedtime.      . insulin lispro (HUMALOG) 100 UNIT/ML injection Inject into the skin 3 (three) times daily before meals.      . latanoprost (XALATAN) 0.005 % ophthalmic solution 1 drop at bedtime.      Marland Kitchen levothyroxine (SYNTHROID, LEVOTHROID) 50 MCG tablet Take 50 mcg by mouth daily.      . montelukast (SINGULAIR) 10 MG tablet Take 10 mg by mouth at bedtime.      . nebivolol (BYSTOLIC) 10 MG tablet Take 10 mg by mouth daily.      . potassium chloride SA (K-DUR,KLOR-CON) 20 MEQ tablet Take 20 mEq by mouth 2 (two) times daily.      Marland Kitchen Propylene Glycol (SYSTANE BALANCE) 0.6 % SOLN Apply to eye.      . pyridoxine (B-6) 250 MG tablet Take 250 mg by mouth daily.      . valsartan (DIOVAN) 160 MG tablet Take 160 mg by mouth daily.      Marland Kitchen  Vitamin D, Ergocalciferol, (DRISDOL) 50000 UNITS CAPS Take 50,000 Units by mouth.      . Warfarin Sodium (COUMADIN PO) Take by mouth.      . Calcium Carbonate (OS-CAL PO) Take by mouth.      . escitalopram (LEXAPRO) 20 MG tablet Take 20 mg by mouth daily.      Marland Kitchen lidocaine (LIDODERM) 5 % Place 1 patch onto the skin daily. Remove & Discard patch within 12 hours or as directed by MD      . omeprazole (PRILOSEC) 20 MG capsule Take 20 mg by mouth daily.        REVIEW OF SYSTEMS: Arly.Keller ] denotes positive finding; [  ] denotes negative finding  CARDIOVASCULAR:  [ ]  chest pain   [ ]  chest pressure   [ ]  palpitations   Arly.Keller ] orthopnea   Arly.Keller ] dyspnea on exertion   [ ]  claudication   [ ]  rest pain   [ ]  DVT   [ ]  phlebitis PULMONARY:   [ ]  productive cough   [ ]  asthma   [ ]  wheezing NEUROLOGIC:   [ ]  weakness  [ ]  paresthesias  [ ]  aphasia  [ ]  amaurosis  [ ]  dizziness HEMATOLOGIC:   [ ]  bleeding problems   [ ]  clotting disorders MUSCULOSKELETAL:  [ ]   joint pain   [ ]  joint swelling [ ]  leg swelling GASTROINTESTINAL: [ ]   blood in stool  [ ]   hematemesis GENITOURINARY:  [ ]   dysuria  [ ]   hematuria PSYCHIATRIC:  [ ]  history of major depression INTEGUMENTARY:  [ ]  rashes  [ ]  ulcers CONSTITUTIONAL:  [ ]  fever   [ ]  chills  PHYSICAL EXAM: Filed Vitals:   09/28/11 1004 09/28/11 1007  BP: 98/79 94/58  Pulse: 75 73  Resp: 16   Height: 5\' 4"  (1.626 m)   Weight: 178 lb (80.74 kg)   SpO2: 96% 99%   Body mass index is 30.55 kg/(m^2). GENERAL: The patient is a well-nourished female, in no acute distress. The vital signs are documented above. CARDIOVASCULAR: There is a regular rate and rhythm without significant murmur appreciated. I do not detect carotid bruits. Both feet are warm and well-perfused. She has generalized edema with more significant edema in her right arm. (She said previous right mastectomy). PULMONARY: There is good air exchange bilaterally. She does have crackles in both bases. ABDOMEN: Soft and non-tender with normal pitched bowel sounds.  MUSCULOSKELETAL: There are no major deformities or cyanosis. NEUROLOGIC: No focal weakness or paresthesias are detected. She has some expressive aphasia. SKIN: There are no ulcers or rashes noted. PSYCHIATRIC: The patient has a normal affect.  DATA:  I. Independently interpreted her carotid duplex scan which shows a 40-59% right carotid stenosis with no significant stenosis on the left. Both vertebral arteries are patent with normally directed flow. In the perineum this study to her study year ago the velocities on the right and actually improved slightly.  MEDICAL ISSUES:  Occlusion and stenosis of carotid artery without mention of cerebral infarction This patient has a moderate 40-59% right carotid stenosis. She has no stenosis on the left. We would not consider right carotid endarterectomy unless the stenosis progressed to greater than 80% or she became symptomatic. She has been on  Coumadin since her stroke in 2009 and is not on aspirin. I've ordered a follow up carotid duplex scan in 1 year and I'll see her back at that time. She knows to call  sooner if she has problems.    Crystel Demarco S [facility]: [phone]

## 2011-10-04 ENCOUNTER — Ambulatory Visit
Admission: RE | Admit: 2011-10-04 | Discharge: 2011-10-04 | Disposition: A | Discharge status: Home / Self Care | Payer: Medicare Other | Source: Ambulatory Visit | Attending: Endocrinology | Admitting: Endocrinology

## 2011-10-04 DIAGNOSIS — Z9011 Acquired absence of right breast and nipple: Secondary | ICD-10-CM

## 2011-10-04 DIAGNOSIS — Z1231 Encounter for screening mammogram for malignant neoplasm of breast: Secondary | ICD-10-CM

## 2011-10-05 NOTE — Procedures (Unsigned)
CAROTID DUPLEX EXAM  INDICATION:  Carotid stenosis.  HISTORY: Diabetes:  Yes. Cardiac:  Atrial fibrillation. Hypertension:  Yes. Smoking:  No. Previous Surgery:  No. CV History:  CVA in 2009, possible CVA in 2011. Amaurosis Fugax No, Paresthesias No, Hemiparesis No.                                      RIGHT             LEFT Brachial systolic pressure: Brachial Doppler waveforms: Vertebral direction of flow:        Antegrade         Antegrade DUPLEX VELOCITIES (cm/sec) CCA peak systolic                   49                54 ECA peak systolic                   79                73 ICA peak systolic                   168               48 ICA end diastolic                   44                15 PLAQUE MORPHOLOGY:                  Heterogeneous     Heterogeneous PLAQUE AMOUNT:                      Moderate          Mild PLAQUE LOCATION:                    ICA               ICA/ECA.  IMPRESSION:  Doppler velocities suggest 40% to 59% stenosis of the right proximal internal carotid artery and no hemodynamically significant stenosis of the left internal carotid artery.  Plaque formations are noted in the bilateral carotid arteries as described above.  No significant change in the velocities when compared to the previous exam on 09/22/2010.  ___________________________________________ Di Kindle. Edilia Bo, M.D.  CH/MEDQ  D:  09/28/2011  T:  09/28/2011  Job:  [id number]

## 2012-09-12 ENCOUNTER — Other Ambulatory Visit: Payer: Self-pay | Admitting: Endocrinology

## 2012-09-12 DIAGNOSIS — Z1231 Encounter for screening mammogram for malignant neoplasm of breast: Secondary | ICD-10-CM

## 2012-09-26 ENCOUNTER — Ambulatory Visit: Payer: Medicare Other | Admitting: Vascular Surgery

## 2012-10-08 ENCOUNTER — Ambulatory Visit
Admission: RE | Admit: 2012-10-08 | Discharge: 2012-10-08 | Disposition: A | Discharge status: Home / Self Care | Payer: Medicare Other | Source: Ambulatory Visit | Attending: Endocrinology | Admitting: Endocrinology

## 2012-10-08 ENCOUNTER — Other Ambulatory Visit: Payer: Self-pay | Admitting: Endocrinology

## 2012-10-08 DIAGNOSIS — Z1231 Encounter for screening mammogram for malignant neoplasm of breast: Secondary | ICD-10-CM

## 2012-10-09 ENCOUNTER — Encounter: Payer: Self-pay | Admitting: Vascular Surgery

## 2012-10-10 ENCOUNTER — Ambulatory Visit (INDEPENDENT_AMBULATORY_CARE_PROVIDER_SITE_OTHER): Payer: Medicare Other | Admitting: Neurosurgery

## 2012-10-10 ENCOUNTER — Encounter: Payer: Self-pay | Admitting: Neurosurgery

## 2012-10-10 ENCOUNTER — Other Ambulatory Visit (INDEPENDENT_AMBULATORY_CARE_PROVIDER_SITE_OTHER): Payer: Medicare Other | Admitting: *Deleted

## 2012-10-10 DIAGNOSIS — I6529 Occlusion and stenosis of unspecified carotid artery: Secondary | ICD-10-CM

## 2012-10-10 NOTE — Progress Notes (Signed)
[facility] [place] Carotid Office Note  CC: Carotid surveillance Referring Physician: Edilia Bo  History of Present Illness: 79 year old female patient of Dr. Edilia Bo followed for known carotid stenosis. The patient does reside [employer] home but has no acute signs or symptoms of CVA, TIA, amaurosis fugax. The patient does have multiple medical comorbidities.  Past Medical History  Diagnosis Date  . Carotid artery occlusion   . Diabetes mellitus   . Hypertension   . Hyperlipidemia   . Atrial fibrillation   . Blindness   . Cancer     breast  . Stroke 2009 and  2011    difficulty with swallowing    ROS: [x]  Positive   [ ]  Denies    General: [ ]  Weight loss, [ ]  Fever, [ ]  chills Neurologic: [ ]  Dizziness, [ ]  Blackouts, [ ]  Seizure [ ]  Stroke, [ ]  "Mini stroke", [ ]  Slurred speech, [ ]  Temporary blindness; [ ]  weakness in arms or legs, [ ]  Hoarseness Cardiac: [ ]  Chest pain/pressure, [ ]  Shortness of breath at rest [ ]  Shortness of breath with exertion, [ ]  Atrial fibrillation or irregular heartbeat Vascular: [ ]  Pain in legs with walking, [ ]  Pain in legs at rest, [ ]  Pain in legs at night,  [ ]  Non-healing ulcer, [ ]  Blood clot in vein/DVT,   Pulmonary: [ ]  Home oxygen, [ ]  Productive cough, [ ]  Coughing up blood, [ ]  Asthma,  [ ]  Wheezing Musculoskeletal:  [ ]  Arthritis, [ ]  Low back pain, [ ]  Joint pain Hematologic: [ ]  Easy Bruising, [ ]  Anemia; [ ]  Hepatitis Gastrointestinal: [ ]  Blood in stool, [ ]  Gastroesophageal Reflux/heartburn, [ ]  Trouble swallowing Urinary: [ ]  chronic Kidney disease, [ ]  on HD - [ ]  MWF or [ ]  TTHS, [ ]  Burning with urination, [ ]  Difficulty urinating Skin: [ ]  Rashes, [ ]  Wounds Psychological: [ ]  Anxiety, [ ]  Depression   Social History History  Substance Use Topics  . Smoking status: Never Smoker   . Smokeless tobacco: Never Used  . Alcohol Use: No    Family History Family History  Problem Relation Age of  Onset  . Cancer Mother   . Heart disease Father     Heart disease before age 43    No Known Allergies  Current Outpatient Prescriptions  Medication Sig Dispense Refill  . acetaminophen (TYLENOL) 325 MG tablet Take 650 mg by mouth every 6 (six) hours as needed.      Marland Kitchen albuterol (PROVENTIL) (2.5 MG/3ML) 0.083% nebulizer solution Take 2.5 mg by nebulization every 6 (six) hours as needed.      Marland Kitchen amLODipine (NORVASC) 10 MG tablet Take 10 mg by mouth daily.      . Calcium Carbonate (OS-CAL PO) Take by mouth.      . calcium carbonate (TUMS - DOSED IN MG ELEMENTAL CALCIUM) 500 MG chewable tablet Chew 1 tablet by mouth daily.      . citalopram (CELEXA) 40 MG tablet Take 40 mg by mouth daily.      Marland Kitchen docusate sodium (COLACE) 100 MG capsule Take 100 mg by mouth 2 (two) times daily.      Marland Kitchen escitalopram (LEXAPRO) 20 MG tablet Take 20 mg by mouth daily.      . fluticasone (FLONASE) 50 MCG/ACT nasal spray Place 2 sprays into the nose daily.      . furosemide (LASIX) 40 MG tablet Take 40 mg by mouth daily.      Marland Kitchen  gabapentin (NEURONTIN) 100 MG capsule Take 300 mg by mouth 3 (three) times daily.      . insulin glargine (LANTUS) 100 UNIT/ML injection Inject into the skin at bedtime.      . insulin lispro (HUMALOG) 100 UNIT/ML injection Inject into the skin 3 (three) times daily before meals.      . latanoprost (XALATAN) 0.005 % ophthalmic solution 1 drop at bedtime.      Marland Kitchen levothyroxine (SYNTHROID, LEVOTHROID) 50 MCG tablet Take 50 mcg by mouth daily.      Marland Kitchen lidocaine (LIDODERM) 5 % Place 1 patch onto the skin daily. Remove & Discard patch within 12 hours or as directed by MD      . montelukast (SINGULAIR) 10 MG tablet Take 10 mg by mouth at bedtime.      . nebivolol (BYSTOLIC) 10 MG tablet Take 10 mg by mouth daily.      Marland Kitchen omeprazole (PRILOSEC) 20 MG capsule Take 20 mg by mouth daily.      . potassium chloride SA (K-DUR,KLOR-CON) 20 MEQ tablet Take 20 mEq by mouth 2 (two) times daily.      Marland Kitchen Propylene  Glycol (SYSTANE BALANCE) 0.6 % SOLN Apply to eye.      . pyridoxine (B-6) 250 MG tablet Take 250 mg by mouth daily.      . valsartan (DIOVAN) 160 MG tablet Take 160 mg by mouth daily.      . Vitamin D, Ergocalciferol, (DRISDOL) 50000 UNITS CAPS Take 50,000 Units by mouth.      . Warfarin Sodium (COUMADIN PO) Take by mouth.       No current facility-administered medications for this visit.    Physical Examination  Filed Vitals:   10/10/12 1332  BP: 110/70  Pulse: 90  Resp: 16    Body mass index is 35.93 kg/(m^2).  General:  WDWN in NAD Gait: Normal HEENT: WNL Eyes: Pupils equal Pulmonary: normal non-labored breathing , without Rales, rhonchi,  wheezing Cardiac: RRR, without  Murmurs, rubs or gallops; Abdomen: soft, NT, no masses Skin: no rashes, ulcers noted  Vascular Exam Pulses: 3+ radial pulses bilaterally Carotid bruits: Dampened carotid pulses bilaterally no bruits are heard Extremities without ischemic changes, no Gangrene , no cellulitis; no open wounds;  Musculoskeletal: no muscle wasting or atrophy   Neurologic: A&O X 3; Appropriate Affect ; SENSATION: normal; MOTOR FUNCTION:  moving all extremities equally. Speech is fluent/normal  Non-Invasive Vascular Imaging CAROTID DUPLEX 10/10/2012  Right ICA 40 - 59 % stenosis Left ICA 20 - 39 % stenosis   ASSESSMENT/PLAN: Asymptomatic patient with stable carotid duplex from previous exam with slight increase in right ICA velocities, the patient will followup in one year with repeat carotid duplex. The patient's questions were encouraged and answered, she is in agreement with this plan.  Lauree Chandler ANP   Clinic MD: Edilia Bo

## 2012-10-11 NOTE — Addendum Note (Signed)
Addended by: Sharee Pimple on: 10/11/2012 08:34 AM   Modules accepted: Orders

## 2012-10-17 ENCOUNTER — Ambulatory Visit: Payer: Medicare Other | Admitting: Vascular Surgery

## 2012-10-17 ENCOUNTER — Other Ambulatory Visit: Payer: Medicare Other

## 2013-07-11 ENCOUNTER — Encounter (HOSPITAL_COMMUNITY): Payer: Self-pay | Admitting: Emergency Medicine

## 2013-07-11 ENCOUNTER — Emergency Department (HOSPITAL_COMMUNITY): Payer: Medicare Other

## 2013-07-11 ENCOUNTER — Emergency Department (HOSPITAL_COMMUNITY)
Admission: EM | Admit: 2013-07-11 | Discharge: 2013-07-11 | Disposition: A | Discharge status: Home / Self Care | Payer: Medicare Other | Attending: Emergency Medicine | Admitting: Emergency Medicine

## 2013-07-11 DIAGNOSIS — W1809XA Striking against other object with subsequent fall, initial encounter: Secondary | ICD-10-CM | POA: Insufficient documentation

## 2013-07-11 DIAGNOSIS — I4891 Unspecified atrial fibrillation: Secondary | ICD-10-CM | POA: Insufficient documentation

## 2013-07-11 DIAGNOSIS — Z853 Personal history of malignant neoplasm of breast: Secondary | ICD-10-CM | POA: Insufficient documentation

## 2013-07-11 DIAGNOSIS — S0003XA Contusion of scalp, initial encounter: Secondary | ICD-10-CM | POA: Insufficient documentation

## 2013-07-11 DIAGNOSIS — E785 Hyperlipidemia, unspecified: Secondary | ICD-10-CM | POA: Insufficient documentation

## 2013-07-11 DIAGNOSIS — E119 Type 2 diabetes mellitus without complications: Secondary | ICD-10-CM | POA: Insufficient documentation

## 2013-07-11 DIAGNOSIS — S0093XA Contusion of unspecified part of head, initial encounter: Secondary | ICD-10-CM

## 2013-07-11 DIAGNOSIS — N390 Urinary tract infection, site not specified: Secondary | ICD-10-CM

## 2013-07-11 DIAGNOSIS — I69992 Facial weakness following unspecified cerebrovascular disease: Secondary | ICD-10-CM | POA: Insufficient documentation

## 2013-07-11 DIAGNOSIS — R739 Hyperglycemia, unspecified: Secondary | ICD-10-CM

## 2013-07-11 DIAGNOSIS — M6281 Muscle weakness (generalized): Secondary | ICD-10-CM | POA: Insufficient documentation

## 2013-07-11 DIAGNOSIS — Z7901 Long term (current) use of anticoagulants: Secondary | ICD-10-CM | POA: Insufficient documentation

## 2013-07-11 DIAGNOSIS — Y9389 Activity, other specified: Secondary | ICD-10-CM | POA: Insufficient documentation

## 2013-07-11 DIAGNOSIS — S8000XA Contusion of unspecified knee, initial encounter: Secondary | ICD-10-CM | POA: Insufficient documentation

## 2013-07-11 DIAGNOSIS — W19XXXA Unspecified fall, initial encounter: Secondary | ICD-10-CM

## 2013-07-11 DIAGNOSIS — Z794 Long term (current) use of insulin: Secondary | ICD-10-CM | POA: Insufficient documentation

## 2013-07-11 DIAGNOSIS — Y9210 Unspecified residential institution as the place of occurrence of the external cause: Secondary | ICD-10-CM | POA: Insufficient documentation

## 2013-07-11 DIAGNOSIS — Z79899 Other long term (current) drug therapy: Secondary | ICD-10-CM | POA: Insufficient documentation

## 2013-07-11 DIAGNOSIS — I69998 Other sequelae following unspecified cerebrovascular disease: Secondary | ICD-10-CM | POA: Insufficient documentation

## 2013-07-11 DIAGNOSIS — I10 Essential (primary) hypertension: Secondary | ICD-10-CM | POA: Insufficient documentation

## 2013-07-11 DIAGNOSIS — S8002XA Contusion of left knee, initial encounter: Secondary | ICD-10-CM

## 2013-07-11 LAB — BASIC METABOLIC PANEL
BUN: 27 mg/dL — ABNORMAL HIGH (ref 6–23)
Chloride: 101 mEq/L (ref 96–112)
GFR calc Af Amer: 61 mL/min — ABNORMAL LOW (ref >90)
GFR calc non Af Amer: 53 mL/min — ABNORMAL LOW (ref >90)
Potassium: 4.5 mEq/L (ref 3.5–5.1)
Sodium: 141 mEq/L (ref 135–145)

## 2013-07-11 LAB — URINALYSIS, ROUTINE W REFLEX MICROSCOPIC
Bilirubin Urine: NEGATIVE
Glucose, UA: >1000 mg/dL — AB
Ketones, ur: NEGATIVE mg/dL
Protein, ur: NEGATIVE mg/dL
pH: 5.5 (ref 5.0–8.0)

## 2013-07-11 LAB — URINE MICROSCOPIC-ADD ON

## 2013-07-11 LAB — POCT I-STAT, CHEM 8
BUN: 29 mg/dL — ABNORMAL HIGH (ref 6–23)
Chloride: 104 mEq/L (ref 96–112)
Creatinine, Ser: 1.1 mg/dL (ref 0.50–1.10)
Glucose, Bld: 321 mg/dL — ABNORMAL HIGH (ref 70–99)
Hemoglobin: 13.6 g/dL (ref 12.0–15.0)
Potassium: 5.2 mEq/L — ABNORMAL HIGH (ref 3.5–5.1)
Sodium: 141 mEq/L (ref 135–145)

## 2013-07-11 MED ORDER — CEPHALEXIN 500 MG PO CAPS: 500.0000 mg | ORAL_CAPSULE | Freq: Two times a day (BID) | ORAL | Status: DC

## 2013-07-11 MED ORDER — SODIUM CHLORIDE 0.9 % IV BOLUS (SEPSIS)
500.0000 mL | Freq: Once | INTRAVENOUS | Status: AC
  Administered 2013-07-11: 500 mL via INTRAVENOUS

## 2013-07-11 NOTE — ED Notes (Signed)
Joanna Hill [phone] contact

## 2013-07-11 NOTE — ED Provider Notes (Signed)
CSN: [id number]     Arrival date & time 07/11/13  1136 History   First MD Initiated Contact with Patient 07/11/13 1139     Chief Complaint  Patient presents with  . Hyperglycemia  . Fall   (Consider location/radiation/quality/duration/timing/severity/associated sxs/prior Treatment) Patient is a 79 y.o. female presenting with hyperglycemia and fall. The history is provided by the patient.  Hyperglycemia Associated symptoms: no abdominal pain, no chest pain, no nausea, no shortness of breath and no vomiting   Fall Pertinent negatives include no chest pain, no abdominal pain, no headaches and no shortness of breath.   patient was unsteady getting out of the bathroom and bumped into the door frame. She fell and hit her right knee. No loss of consciousness. No headache. No confusion. She has some right-sided weakness from previous stroke. No nausea vomiting. She reportedly was hypoglycemic earlier today. Her sugar was down to 21. She was treated and she's been above 300 here. No chest pain. Abdominal pain. No confusion. No numbness or new weakness.   Past Medical History  Diagnosis Date  . Carotid artery occlusion   . Diabetes mellitus   . Hypertension   . Hyperlipidemia   . Atrial fibrillation   . Blindness   . Cancer     breast  . Stroke 2009 and  2011    difficulty with swallowing   Past Surgical History  Procedure Laterality Date  . Arch aortogram  03-15-2010   Family History  Problem Relation Age of Onset  . Cancer Mother   . Heart disease Father     Heart disease before age 31   History  Substance Use Topics  . Smoking status: Never Smoker   . Smokeless tobacco: Never Used  . Alcohol Use: No   OB History   Grav Para Term Preterm Abortions TAB SAB Ect Mult Living                 Review of Systems  Constitutional: Negative for activity change and appetite change.  Eyes: Negative for pain.  Respiratory: Negative for chest tightness and shortness of breath.    Cardiovascular: Negative for chest pain and leg swelling.  Gastrointestinal: Negative for nausea, vomiting, abdominal pain and diarrhea.  Genitourinary: Negative for flank pain.  Musculoskeletal: Negative for back pain and neck stiffness.  Skin: Negative for rash.  Neurological: Negative for weakness, numbness and headaches.  Psychiatric/Behavioral: Negative for behavioral problems.    Allergies  Review of patient's allergies indicates no known allergies.  Home Medications   Current Outpatient Rx  Name  Route  Sig  Dispense  Refill  . acetaminophen (TYLENOL) 325 MG tablet   Oral   Take 650 mg by mouth every 6 (six) hours as needed.         Marland Kitchen albuterol (PROVENTIL) (2.5 MG/3ML) 0.083% nebulizer solution   Nebulization   Take 2.5 mg by nebulization every 6 (six) hours as needed.         Marland Kitchen amLODipine (NORVASC) 10 MG tablet   Oral   Take 10 mg by mouth daily.         . calcium-vitamin D (OSCAL WITH D) 500-200 MG-UNIT per tablet   Oral   Take 1 tablet by mouth daily.         . cholecalciferol (VITAMIN D) 1000 UNITS tablet   Oral   Take 1,000 Units by mouth daily.         . citalopram (CELEXA) 40 MG tablet   Oral  Take 40 mg by mouth daily.         Marland Kitchen docusate sodium (COLACE) 100 MG capsule   Oral   Take 100 mg by mouth 2 (two) times daily.         . DULoxetine (CYMBALTA) 30 MG capsule   Oral   Take 30-60 mg by mouth 2 (two) times daily. 30 mg at 9am & 60mg  at 6pm         . escitalopram (LEXAPRO) 20 MG tablet   Oral   Take 20 mg by mouth daily.         . fluticasone (FLONASE) 50 MCG/ACT nasal spray   Nasal   Place 2 sprays into the nose daily.         . furosemide (LASIX) 40 MG tablet   Oral   Take 20 mg by mouth 2 (two) times daily.          Marland Kitchen gabapentin (NEURONTIN) 100 MG capsule   Oral   Take 200 mg by mouth 2 (two) times daily.         . insulin glargine (LANTUS) 100 UNIT/ML injection   Subcutaneous   Inject into the skin at  bedtime.         . insulin lispro (HUMALOG) 100 UNIT/ML injection   Subcutaneous   Inject into the skin 3 (three) times daily before meals.         Marland Kitchen ketotifen (ZADITOR) 0.025 % ophthalmic solution   Both Eyes   Place into both eyes 2 (two) times daily.         Marland Kitchen latanoprost (XALATAN) 0.005 % ophthalmic solution      1 drop at bedtime.         Marland Kitchen levothyroxine (SYNTHROID, LEVOTHROID) 50 MCG tablet   Oral   Take 50 mcg by mouth daily.         Marland Kitchen loratadine (CLARITIN) 10 MG tablet   Oral   Take 10 mg by mouth daily.         Marland Kitchen LORazepam (ATIVAN) 0.5 MG tablet   Oral   Take 0.5 mg by mouth 2 (two) times daily.         . metoprolol tartrate (LOPRESSOR) 25 MG tablet   Oral   Take 25 mg by mouth 2 (two) times daily.         . montelukast (SINGULAIR) 10 MG tablet   Oral   Take 10 mg by mouth daily.          . potassium chloride SA (K-DUR,KLOR-CON) 20 MEQ tablet   Oral   Take 20 mEq by mouth 2 (two) times daily.         . pravastatin (PRAVACHOL) 20 MG tablet   Oral   Take 20 mg by mouth at bedtime.         Marland Kitchen Propylene Glycol (SYSTANE BALANCE) 0.6 % SOLN   Ophthalmic   Apply to eye.         . ranitidine (ZANTAC) 75 MG tablet   Oral   Take 75 mg by mouth 2 (two) times daily.         . Rivaroxaban (XARELTO) 15 MG TABS tablet   Oral   Take 15 mg by mouth daily with supper.         . traMADol (ULTRAM) 50 MG tablet   Oral   Take 50-100 mg by mouth every 6 (six) hours as needed for moderate pain.         Marland Kitchen  valsartan (DIOVAN) 160 MG tablet   Oral   Take 160 mg by mouth daily.         . cephALEXin (KEFLEX) 500 MG capsule   Oral   Take 1 capsule (500 mg total) by mouth 2 (two) times daily.   14 capsule   0    BP 117/61  Pulse 95  Temp(Src) 98.2 F (36.8 C)  Resp 18  SpO2 100% Physical Exam  Constitutional: She is oriented to person, place, and time. She appears well-developed and well-nourished.  HENT:  Head: Normocephalic.   Small hematoma to left parietal occipital area. No crepitance or deformity.  Cardiovascular: Normal rate and regular rhythm.   Pulmonary/Chest: Effort normal.  Abdominal: Soft. There is no tenderness.  Musculoskeletal: She exhibits tenderness.  Mild ecchymosis and tenderness to left proximal tibia medially. No crepitance or deformity. Range of motion intact at knee and at hip.  Neurological: She is alert and oriented to person, place, and time.  Mild left-sided facial droop. Mild weakness on the right compared to left. Both chronic per patient.    ED Course  Procedures (including critical care time) Labs Review Labs Reviewed  GLUCOSE, CAPILLARY - Abnormal; Notable for the following:    Glucose-Capillary 332 (*)    All other components within normal limits  URINALYSIS, ROUTINE W REFLEX MICROSCOPIC - Abnormal; Notable for the following:    APPearance CLOUDY (*)    Glucose, UA >1000 (*)    Hgb urine dipstick TRACE (*)    Leukocytes, UA MODERATE (*)    All other components within normal limits  PROTIME-INR - Abnormal; Notable for the following:    Prothrombin Time 18.6 (*)    INR 1.60 (*)    All other components within normal limits  URINE MICROSCOPIC-ADD ON - Abnormal; Notable for the following:    Bacteria, UA FEW (*)    All other components within normal limits  BASIC METABOLIC PANEL - Abnormal; Notable for the following:    Glucose, Bld 310 (*)    BUN 27 (*)    GFR calc non Af Amer 53 (*)    GFR calc Af Amer 61 (*)    All other components within normal limits  POCT I-STAT, CHEM 8 - Abnormal; Notable for the following:    Potassium 5.2 (*)    BUN 29 (*)    Glucose, Bld 321 (*)    All other components within normal limits  URINE CULTURE   Imaging Review Ct Head Wo Contrast  07/11/2013   CLINICAL DATA:  Status post fall.  EXAM: CT HEAD WITHOUT CONTRAST  TECHNIQUE: Contiguous axial images were obtained from the base of the skull through the vertex without intravenous  contrast.  COMPARISON:  April 08, 2010.  FINDINGS: There is mild diffuse cerebral and cerebellar atrophy with mild compensatory ventriculomegaly. There are stable changes of encephalomalacia in the left posterior parietal lobe. There is decreased density in the deep white matter of both cerebral hemispheres consistent with chronic small vessel ischemic type change. There are no findings to suggest an evolving ischemic infarction. There is no evidence of an acute intracranial hemorrhage. There is no shift of the midline. Calcification of the falx cerebral read is present anteriorly.  At bone window settings the observed portions of the paranasal sinuses and mastoid air cells are clear. There is no evidence of an acute skull fracture. No cephalohematoma is evident.  IMPRESSION: There are chronic changes present as described. There is no evidence of an  acute intracranial hemorrhage nor other acute intracranial abnormality.   Electronically Signed   By: David  [place]   On: 07/11/2013 12:48   Dg Knee Complete 4 Views Left  07/11/2013   CLINICAL DATA:  Hyperglycemia/fall.  EXAM: LEFT KNEE - COMPLETE 4+ VIEW  COMPARISON:  None.  FINDINGS: Mild diffuse decreased bone density. There is no evidence of fracture or dislocation. There are no significant degenerative change is present. There is mild arterial at the atherosclerotic disease.  IMPRESSION: No acute findings.   Electronically Signed   By: Elberta Fortis M.D.   On: 07/11/2013 12:32    EKG Interpretation   None       MDM   1. UTI (lower urinary tract infection)   2. Fall, initial encounter   3. Head contusion, initial encounter   4. Knee contusion, left, initial encounter   5. Hyperglycemia    Patient with fall on Coumadin. Negative head CT. Patient is at her baseline. Also has possible UTI. We'll treat with antibiotics. Patient can have neurologic checks at her facility to watch for delayed bleed.    Juliet Rude. Rubin Payor, MD 07/11/13 1445

## 2013-07-11 NOTE — ED Notes (Signed)
Report given to Lurena Joiner, LPN at [facility]; dischrg instruction read and understood by Lurena Joiner, LPN.  [facility] called and en route

## 2013-07-11 NOTE — ED Notes (Signed)
[facility] presents with a 79 yo female from [facility] with hyperglycemia and an unwitnessed fall.  Pt per nursing staff had a low blood sugar this morning around 8 am of 21 mg/dl.  Meds withheld and appropriate nursing care given to raise blood sugar.  Later on pt wasn't acting her normal self per nursing staff. Blood sugar was checked at 311 mg/dl.  Pt subsequently had an unwitnessed fall.  No obvious deformities, small bump on left side of head and some slight brusing and swelling on left knee.  No LOC per pt.

## 2013-07-12 LAB — URINE CULTURE: Culture: NO GROWTH

## 2013-07-23 ENCOUNTER — Emergency Department (HOSPITAL_COMMUNITY): Payer: Medicare Other

## 2013-07-23 ENCOUNTER — Encounter (HOSPITAL_COMMUNITY): Payer: Self-pay | Admitting: Emergency Medicine

## 2013-07-23 ENCOUNTER — Emergency Department (HOSPITAL_COMMUNITY)
Admission: EM | Admit: 2013-07-23 | Discharge: 2013-07-23 | Disposition: A | Discharge status: Skilled Nursing Facility | Payer: Medicare Other | Attending: Emergency Medicine | Admitting: Emergency Medicine

## 2013-07-23 DIAGNOSIS — R29810 Facial weakness: Secondary | ICD-10-CM | POA: Insufficient documentation

## 2013-07-23 DIAGNOSIS — N289 Disorder of kidney and ureter, unspecified: Secondary | ICD-10-CM

## 2013-07-23 DIAGNOSIS — Z792 Long term (current) use of antibiotics: Secondary | ICD-10-CM | POA: Insufficient documentation

## 2013-07-23 DIAGNOSIS — I4891 Unspecified atrial fibrillation: Secondary | ICD-10-CM | POA: Insufficient documentation

## 2013-07-23 DIAGNOSIS — E785 Hyperlipidemia, unspecified: Secondary | ICD-10-CM | POA: Insufficient documentation

## 2013-07-23 DIAGNOSIS — IMO0002 Reserved for concepts with insufficient information to code with codable children: Secondary | ICD-10-CM | POA: Insufficient documentation

## 2013-07-23 DIAGNOSIS — E119 Type 2 diabetes mellitus without complications: Secondary | ICD-10-CM | POA: Insufficient documentation

## 2013-07-23 DIAGNOSIS — H543 Unqualified visual loss, both eyes: Secondary | ICD-10-CM | POA: Insufficient documentation

## 2013-07-23 DIAGNOSIS — Z853 Personal history of malignant neoplasm of breast: Secondary | ICD-10-CM | POA: Insufficient documentation

## 2013-07-23 DIAGNOSIS — Z79899 Other long term (current) drug therapy: Secondary | ICD-10-CM | POA: Insufficient documentation

## 2013-07-23 DIAGNOSIS — Z794 Long term (current) use of insulin: Secondary | ICD-10-CM | POA: Insufficient documentation

## 2013-07-23 DIAGNOSIS — Z8673 Personal history of transient ischemic attack (TIA), and cerebral infarction without residual deficits: Secondary | ICD-10-CM | POA: Insufficient documentation

## 2013-07-23 DIAGNOSIS — N390 Urinary tract infection, site not specified: Secondary | ICD-10-CM

## 2013-07-23 DIAGNOSIS — E860 Dehydration: Secondary | ICD-10-CM

## 2013-07-23 DIAGNOSIS — I10 Essential (primary) hypertension: Secondary | ICD-10-CM | POA: Insufficient documentation

## 2013-07-23 DIAGNOSIS — Z7901 Long term (current) use of anticoagulants: Secondary | ICD-10-CM | POA: Insufficient documentation

## 2013-07-23 LAB — URINALYSIS, ROUTINE W REFLEX MICROSCOPIC
Bilirubin Urine: NEGATIVE
Glucose, UA: NEGATIVE mg/dL
Ketones, ur: NEGATIVE mg/dL
Nitrite: NEGATIVE
Specific Gravity, Urine: 1.018 (ref 1.005–1.030)
pH: 5 (ref 5.0–8.0)

## 2013-07-23 LAB — CBC
HCT: 38.4 % (ref 36.0–46.0)
Hemoglobin: 12.8 g/dL (ref 12.0–15.0)
MCH: 29.6 pg (ref 26.0–34.0)
MCHC: 33.3 g/dL (ref 30.0–36.0)
MCV: 88.9 fL (ref 78.0–100.0)
RDW: 14.1 % (ref 11.5–15.5)

## 2013-07-23 LAB — URINE MICROSCOPIC-ADD ON

## 2013-07-23 LAB — COMPREHENSIVE METABOLIC PANEL
Albumin: 3.8 g/dL (ref 3.5–5.2)
Alkaline Phosphatase: 89 U/L (ref 39–117)
BUN: 31 mg/dL — ABNORMAL HIGH (ref 6–23)
Calcium: 10 mg/dL (ref 8.4–10.5)
Creatinine, Ser: 1.2 mg/dL — ABNORMAL HIGH (ref 0.50–1.10)
GFR calc Af Amer: 48 mL/min — ABNORMAL LOW (ref >90)
Glucose, Bld: 90 mg/dL (ref 70–99)
Potassium: 4 mEq/L (ref 3.5–5.1)
Sodium: 139 mEq/L (ref 135–145)
Total Protein: 7.5 g/dL (ref 6.0–8.3)

## 2013-07-23 LAB — GLUCOSE, CAPILLARY: Glucose-Capillary: 88 mg/dL (ref 70–99)

## 2013-07-23 MED ORDER — DEXTROSE 5 % IV SOLN
1.0000 g | Freq: Once | INTRAVENOUS | Status: AC
  Administered 2013-07-23: 1 g via INTRAVENOUS
  Filled 2013-07-23: qty 10

## 2013-07-23 NOTE — ED Notes (Signed)
DIL states she is able to answer all questions appropriately for her but was talking to her about people being in her room acting like they were taking pictures but they didn't have a real camera.

## 2013-07-23 NOTE — ED Notes (Signed)
Report called to Kathlene November at [facility].  [facility] called to transport pt back.

## 2013-07-23 NOTE — ED Notes (Signed)
Pt is from [facility] and per staff  Pt has not been her self this am.  Was ok yesterday per staff but today her facial droop seems  More pronounced than usual. Pt is being tx for UTI pts cbg was 190 prior to ems arrival and she was given insulin bp was 120/ 72 64 heart rate pt has aspasia anyway and that also seems more pronounced today

## 2013-07-23 NOTE — ED Provider Notes (Signed)
Medical screening examination/treatment/procedure(s) were conducted as a shared visit with non-physician practitioner(s) and myself.  I personally evaluated the patient during the encounter.  EKG Interpretation    Date/Time:  Tuesday July 23 2013 12:05:03 EST Ventricular Rate:  106 PR Interval:    QRS Duration: 82 QT Interval:  376 QTC Calculation: 499 R Axis:   31 Text Interpretation:  Atrial fibrillation Low voltage, precordial leads Nonspecific T abnormalities, anterior leads Borderline prolonged QT interval Confirmed by Freida Busman  MD, Carrol Hougland ([id number]) on 07/23/2013 12:14:22 PM           Patient seen and examined. She is mentating appropriately at this time. Given Rocephin for her UTI. We'll speak with her physician about doing IV antibiotics at the nursing home versus oral antibiotics. She does not appear septic at this time and is stable for transferred back to the nursing home  Toy Baker, MD 07/23/13 1453

## 2013-07-23 NOTE — ED Provider Notes (Signed)
CSN: [id number]     Arrival date & time 07/23/13  1057 History   First MD Initiated Contact with Patient 07/23/13 1131     Chief Complaint  Patient presents with  . Altered Mental Status   (Consider location/radiation/quality/duration/timing/severity/associated sxs/prior Treatment) HPI  79 year old female with history of atrial fibrillation currently on Xarelto, history of prior stroke, history of diabetes presents for evaluations of altered mental status. Patient lives at [facility] nursing [facility].  According to nursing staff, pt appears different from baseline with more pronounce facial droop (pt has prior stroke affecting L side).  Pt also was diagnosed with a urinary tract infection on November 28 has been on antibiotic. History obtained through patient and through daughter in law who was at bedside. Patient really has no specific complaints except dysuria including burning on urination which has been ongoing for the past 2 weeks. Symptom has not improved. She currently denies headache, difficulty thinking, trouble speaking, having chest pain, shortness of breath, abdominal pain, nausea vomiting or diarrhea. Daughter-in-law did not notice any worsening facial droop. Her daughter does notice that patient has decrease in daily functions since diagnosed with a urinary tract infection. States prior to that she was active, able to perform daily activities and usually moves around in a wheelchair. Now she has decrease in appetite and hasn't been eating and drinking as normal.  Past Medical History  Diagnosis Date  . Carotid artery occlusion   . Diabetes mellitus   . Hypertension   . Hyperlipidemia   . Atrial fibrillation   . Blindness   . Cancer     breast  . Stroke 2009 and  2011    difficulty with swallowing   Past Surgical History  Procedure Laterality Date  . Arch aortogram  03-15-2010  . Mastectomy, partial Right    Family History  Problem Relation Age of Onset  . Cancer Mother   .  Heart disease Father     Heart disease before age 31   History  Substance Use Topics  . Smoking status: Never Smoker   . Smokeless tobacco: Never Used  . Alcohol Use: No   OB History   Grav Para Term Preterm Abortions TAB SAB Ect Mult Living                 Review of Systems  All other systems reviewed and are negative.    Allergies  Review of patient's allergies indicates no known allergies.  Home Medications   Current Outpatient Rx  Name  Route  Sig  Dispense  Refill  . acetaminophen (TYLENOL) 325 MG tablet   Oral   Take 650 mg by mouth every 6 (six) hours as needed.         Marland Kitchen albuterol (PROVENTIL) (2.5 MG/3ML) 0.083% nebulizer solution   Nebulization   Take 2.5 mg by nebulization every 6 (six) hours as needed.         Marland Kitchen amLODipine (NORVASC) 10 MG tablet   Oral   Take 10 mg by mouth daily.         . calcium-vitamin D (OSCAL WITH D) 500-200 MG-UNIT per tablet   Oral   Take 1 tablet by mouth daily.         . cephALEXin (KEFLEX) 500 MG capsule   Oral   Take 1 capsule (500 mg total) by mouth 2 (two) times daily.   14 capsule   0   . cholecalciferol (VITAMIN D) 1000 UNITS tablet   Oral  Take 1,000 Units by mouth daily.         . citalopram (CELEXA) 40 MG tablet   Oral   Take 40 mg by mouth daily.         Marland Kitchen docusate sodium (COLACE) 100 MG capsule   Oral   Take 100 mg by mouth 2 (two) times daily.         . DULoxetine (CYMBALTA) 30 MG capsule   Oral   Take 30-60 mg by mouth 2 (two) times daily. 30 mg at 9am & 60mg  at 6pm         . escitalopram (LEXAPRO) 20 MG tablet   Oral   Take 20 mg by mouth daily.         . fluticasone (FLONASE) 50 MCG/ACT nasal spray   Nasal   Place 2 sprays into the nose daily.         . furosemide (LASIX) 40 MG tablet   Oral   Take 20 mg by mouth 2 (two) times daily.          Marland Kitchen gabapentin (NEURONTIN) 100 MG capsule   Oral   Take 200 mg by mouth 2 (two) times daily.         . insulin glargine  (LANTUS) 100 UNIT/ML injection   Subcutaneous   Inject into the skin at bedtime.         . insulin lispro (HUMALOG) 100 UNIT/ML injection   Subcutaneous   Inject into the skin 3 (three) times daily before meals.         Marland Kitchen ketotifen (ZADITOR) 0.025 % ophthalmic solution   Both Eyes   Place into both eyes 2 (two) times daily.         Marland Kitchen latanoprost (XALATAN) 0.005 % ophthalmic solution      1 drop at bedtime.         Marland Kitchen levothyroxine (SYNTHROID, LEVOTHROID) 50 MCG tablet   Oral   Take 50 mcg by mouth daily.         Marland Kitchen loratadine (CLARITIN) 10 MG tablet   Oral   Take 10 mg by mouth daily.         Marland Kitchen LORazepam (ATIVAN) 0.5 MG tablet   Oral   Take 0.5 mg by mouth 2 (two) times daily.         . metoprolol tartrate (LOPRESSOR) 25 MG tablet   Oral   Take 25 mg by mouth 2 (two) times daily.         . montelukast (SINGULAIR) 10 MG tablet   Oral   Take 10 mg by mouth daily.          . potassium chloride SA (K-DUR,KLOR-CON) 20 MEQ tablet   Oral   Take 20 mEq by mouth 2 (two) times daily.         . pravastatin (PRAVACHOL) 20 MG tablet   Oral   Take 20 mg by mouth at bedtime.         Marland Kitchen Propylene Glycol (SYSTANE BALANCE) 0.6 % SOLN   Ophthalmic   Apply to eye.         . ranitidine (ZANTAC) 75 MG tablet   Oral   Take 75 mg by mouth 2 (two) times daily.         . Rivaroxaban (XARELTO) 15 MG TABS tablet   Oral   Take 15 mg by mouth daily with supper.         . traMADol (ULTRAM) 50 MG tablet  Oral   Take 50-100 mg by mouth every 6 (six) hours as needed for moderate pain.         . valsartan (DIOVAN) 160 MG tablet   Oral   Take 160 mg by mouth daily.          BP 107/70  Pulse 99  Temp(Src) 98.3 F (36.8 C) (Oral)  Resp 18  SpO2 97% Physical Exam  Nursing note and vitals reviewed. Constitutional: She is oriented to person, place, and time. She appears well-developed and well-nourished. No distress.  Awake, alert, nontoxic appearance   HENT:  Head: Atraumatic.  Eyes: Conjunctivae are normal. Right eye exhibits no discharge. Left eye exhibits no discharge.  Neck: Neck supple.  Cardiovascular:  Irregularly irregular rhythm  Pulmonary/Chest: Effort normal. No respiratory distress. She exhibits no tenderness.  Abdominal: Soft. There is no tenderness. There is no rebound.  Musculoskeletal: She exhibits no tenderness.  ROM appears intact, no obvious focal weakness  Neurological: She is alert and oriented to person, place, and time.  Speech is delay with mild dysarthria, pupils equal round reactive to light, difficult to access extra ocular movement as pt is nearly blind L side facial droop (baseline according to daughter) Follows commands, moves all extremities x4, normal strength to bilateral upper and lower extremities at all major muscle groups including grip Sensation normal to light touch  Coordination intact, no limb ataxia Did not assess gait.     Skin: No rash noted.  Psychiatric: She has a normal mood and affect.    ED Course  Procedures (including critical care time)  12:26 PM Patient was sent here from nursing home because she has more pronounced facial droops and appears more lethargic than her baseline according to nursing staff. Patient however is alert and oriented x3. Her only complaint is dysuria but she has no other complaints. Workup initiated. Care discussed with attending.  1:41 PM Patient has fell outpatient treatment for UTI with Cipro. UA shows evidence of urinary tract infection. She does have evidence of mild renal insufficiency as compared to baseline. She is afebrile with normal white blood cell.  Will start Rocephin here in the ER. We'll consider admitting for further management of her condition.  2:52 PM Will consult PCP Dr. Evlyn Kanner to notify pt and request further management of her UTI.   2:57 PM I have consult with Dr. Evlyn Kanner who acknowledge that he knows the patient well and agrees to  treat her urinary tract infection with IV antibiotics and with fluid at the nursing facility. Patient will be discharge with close followup. Patient otherwise does not appear toxic. She is stable for discharge.  Labs Review Labs Reviewed  COMPREHENSIVE METABOLIC PANEL - Abnormal; Notable for the following:    BUN 31 (*)    Creatinine, Ser 1.20 (*)    GFR calc non Af Amer 42 (*)    GFR calc Af Amer 48 (*)    All other components within normal limits  URINALYSIS, ROUTINE W REFLEX MICROSCOPIC - Abnormal; Notable for the following:    APPearance TURBID (*)    Hgb urine dipstick SMALL (*)    Leukocytes, UA LARGE (*)    All other components within normal limits  URINE MICROSCOPIC-ADD ON - Abnormal; Notable for the following:    Squamous Epithelial / LPF MANY (*)    Bacteria, UA FEW (*)    Casts GRANULAR CAST (*)    All other components within normal limits  URINE CULTURE  CBC  GLUCOSE, CAPILLARY  CG4 I-STAT (LACTIC ACID)   Imaging Review Ct Head Wo Contrast  07/23/2013   CLINICAL DATA:  Altered mental status  EXAM: CT HEAD WITHOUT CONTRAST  TECHNIQUE: Contiguous axial images were obtained from the base of the skull through the vertex without intravenous contrast.  COMPARISON:  07/11/2013  FINDINGS: No skull fracture is noted. Paranasal sinuses and mastoid air cells are unremarkable. Atherosclerotic calcifications of carotid siphon. Stable cerebral atrophy and chronic white matter disease. No acute cortical infarction. Stable encephalomalacia in left posterior parietal lobe consistent with prior infarction No mass lesion is noted on this unenhanced scan.  IMPRESSION: No acute intracranial abnormality. Stable atrophy and chronic white matter disease. Stable encephalomalacia in left posterior parietal lobe.   Electronically Signed   By: Natasha Mead M.D.   On: 07/23/2013 13:06    EKG Interpretation    Date/Time:  Tuesday July 23 2013 12:05:03 EST Ventricular Rate:  106 PR Interval:    QRS  Duration: 82 QT Interval:  376 QTC Calculation: 499 R Axis:   31 Text Interpretation:  Atrial fibrillation Low voltage, precordial leads Nonspecific T abnormalities, anterior leads Borderline prolonged QT interval Confirmed by ALLEN  MD, ANTHONY ([id number]) on 07/23/2013 12:14:22 PM            MDM   1. UTI (lower urinary tract infection)   2. Renal insufficiency   3. Dehydration    BP 94/61  Pulse 97  Temp(Src) 97.5 F (36.4 C) (Oral)  Resp 18  SpO2 100%  I have reviewed nursing notes and vital signs. I personally reviewed the imaging tests through PACS system  I reviewed available ER/hospitalization records thought the EMR     Fayrene Helper, [place] 07/23/13 1503

## 2013-07-23 NOTE — ED Notes (Signed)
NOTIFIED DR. A. ALLEN OF PATIENTS LAB RESULTS OF CG4 LACTIC ACID = 1.41 mmoI/L ,@13 :00PM ,07/23/2013.

## 2013-07-23 NOTE — ED Notes (Signed)
DIL [name] Bruun cell phone [phone]

## 2013-07-23 NOTE — ED Notes (Signed)
Transported to CT by CT tech.  

## 2013-07-23 NOTE — ED Notes (Signed)
[place], PA at the bedside 

## 2013-07-23 NOTE — ED Notes (Signed)
Called DIL to inform her she would be going home. Informed pt I spoke with the DIL.

## 2013-07-24 LAB — URINE CULTURE: Culture: NO GROWTH

## 2013-07-24 NOTE — ED Provider Notes (Signed)
Medical screening examination/treatment/procedure(s) were conducted as a shared visit with non-physician practitioner(s) and myself.  I personally evaluated the patient during the encounter.  EKG Interpretation    Date/Time:  Tuesday July 23 2013 12:05:03 EST Ventricular Rate:  106 PR Interval:    QRS Duration: 82 QT Interval:  376 QTC Calculation: 499 R Axis:   31 Text Interpretation:  Atrial fibrillation Low voltage, precordial leads Nonspecific T abnormalities, anterior leads Borderline prolonged QT interval Confirmed by Freida Busman  MD, Taron Mondor ([id number]) on 07/23/2013 12:14:22 PM             Toy Baker, MD 07/24/13 [phone]

## 2013-08-28 ENCOUNTER — Encounter: Payer: Self-pay | Admitting: Neurology

## 2013-08-30 ENCOUNTER — Encounter: Payer: Self-pay | Admitting: Neurology

## 2013-08-30 ENCOUNTER — Ambulatory Visit (INDEPENDENT_AMBULATORY_CARE_PROVIDER_SITE_OTHER): Payer: Medicare Other | Admitting: Neurology

## 2013-08-30 ENCOUNTER — Encounter (INDEPENDENT_AMBULATORY_CARE_PROVIDER_SITE_OTHER): Payer: Self-pay

## 2013-08-30 VITALS — BP 86/63 | HR 102

## 2013-08-30 DIAGNOSIS — I6350 Cerebral infarction due to unspecified occlusion or stenosis of unspecified cerebral artery: Secondary | ICD-10-CM

## 2013-08-30 DIAGNOSIS — I4891 Unspecified atrial fibrillation: Secondary | ICD-10-CM

## 2013-08-30 DIAGNOSIS — I639 Cerebral infarction, unspecified: Secondary | ICD-10-CM

## 2013-08-30 DIAGNOSIS — R4182 Altered mental status, unspecified: Secondary | ICD-10-CM

## 2013-08-30 NOTE — Patient Instructions (Signed)
Instruction given on [facility] paperwork

## 2013-08-30 NOTE — Progress Notes (Signed)
[facility]    Provider:  Dr Janann Colonel Referring Provider: Rosine Abe, DO Primary Care Physician:  Drema Pry, DO  CC:  AMS and weakness  HPI:  Joanna Hill is a 80 y.o. female here as a referral from Dr. Shawna Orleans for evaluation of weakness and AMS  Patient is unable to provide any history as to what has changed and no one is with here at this time. Repeatedly states, "they are kicking me out" but is unable to elaborate furhter. She denies any acute issues at this time. Reports pain when urinating. Reports having a stroke in the past, unsure when stroke was but states she still has weakness on her right leg.   Per ED notes from 12/09 visit: 80y/o with hx of A fib, prior stroke, currently on Xarelto presented to ED on 12/09 with AMS and per NH staff an increased facial droop. At that time it was also noted that she was having a decline in overall function. She was diagnosed with a UTI the week prior, given oral abx. At this ED visit was diagnosed with continued UTI and discharged with plans for IV Abx at her NH. Of note, per her lab notes she has had an increasing BUN/Cr.   Review of head CT from December 9, shows chronic small vessel disease and left-sided encephalomalacia, no acute process  Review of Systems: Out of a complete 14 system review, the patient complains of only the following symptoms, and all other reviewed systems are negative. Positive for headache fatigue  History   Social History  . Marital Status: Widowed    Spouse Name: N/A    Number of Children: 3  . Years of Education: 1st   Occupational History  . Not on file.   Social History Main Topics  . Smoking status: Never Smoker   . Smokeless tobacco: Never Used  . Alcohol Use: No  . Drug Use: No  . Sexual Activity: Not on file   Other Topics Concern  . Not on file   Social History Narrative   Patient is a resident at [facility], has 3 children   Patient is left  handed   Educational level 1st grade   Caffeine consumption is 1-2 daily    Family History  Problem Relation Age of Onset  . Cancer Mother   . Heart disease Father     Heart disease before age 55    Past Medical History  Diagnosis Date  . Carotid artery occlusion   . Diabetes mellitus   . Hypertension   . Hyperlipidemia   . Atrial fibrillation   . Blindness   . Cancer     breast  . Stroke 2009 and  2011    difficulty with swallowing    Past Surgical History  Procedure Laterality Date  . Arch aortogram  03-15-2010  . Mastectomy, partial Right     Current Outpatient Prescriptions  Medication Sig Dispense Refill  . acetaminophen (TYLENOL) 325 MG tablet Take 650 mg by mouth every 6 (six) hours as needed for mild pain, fever or headache.       . albuterol (PROVENTIL) (2.5 MG/3ML) 0.083% nebulizer solution Take 2.5 mg by nebulization every 6 (six) hours as needed for wheezing or shortness of breath.      Marland Kitchen amLODipine (NORVASC) 10 MG tablet Take 10 mg by mouth daily.      . calcium-vitamin D (OSCAL WITH D) 500-200 MG-UNIT per tablet Take 1 tablet by  mouth daily.      . cephALEXin (KEFLEX) 500 MG capsule Take 500 mg by mouth 4 (four) times daily.      . Cholecalciferol (VITAMIN D-3) 1000 UNITS CAPS Take 1,000 Units by mouth daily.      . ciprofloxacin (CIPRO) 500 MG tablet Take 500 mg by mouth 2 (two) times daily.      . citalopram (CELEXA) 40 MG tablet Take 40 mg by mouth daily.      Marland Kitchen docusate sodium (COLACE) 100 MG capsule Take 100 mg by mouth 2 (two) times daily.      . DULoxetine (CYMBALTA) 30 MG capsule Take 30-60 mg by mouth 2 (two) times daily. 30 mg at 9am & 60mg  at 6pm      . escitalopram (LEXAPRO) 20 MG tablet Take 20 mg by mouth daily.      . fluticasone (FLONASE) 50 MCG/ACT nasal spray Place into both nostrils daily.      . furosemide (LASIX) 20 MG tablet Take 20 mg by mouth 2 (two) times daily.      . furosemide (LASIX) 40 MG tablet Take 40 mg by mouth.      .  gabapentin (NEURONTIN) 100 MG capsule Take 200 mg by mouth 2 (two) times daily.      . insulin glargine (LANTUS) 100 UNIT/ML injection Inject 20-22 Units into the skin 2 (two) times daily. 20 units at 8 am in the morning and 22 units at 5pm.      . insulin lispro (HUMALOG) 100 UNIT/ML injection Inject 2-4 Units into the skin 3 (three) times daily before meals. <120=0  120-150=2 units  >151=4 units      . insulin lispro (HUMALOG) 100 UNIT/ML injection Inject 5 Units into the skin daily with breakfast.      . ketotifen (ZADITOR) 0.025 % ophthalmic solution Place 1 drop into both eyes 2 (two) times daily.       Marland Kitchen latanoprost (XALATAN) 0.005 % ophthalmic solution Place 1 drop into both eyes at bedtime.       Marland Kitchen levothyroxine (SYNTHROID, LEVOTHROID) 50 MCG tablet Take 50 mcg by mouth daily.      Marland Kitchen loratadine (CLARITIN) 10 MG tablet Take 10 mg by mouth daily.      Marland Kitchen LORazepam (ATIVAN) 0.5 MG tablet Take 0.5 mg by mouth 2 (two) times daily.      . metoprolol tartrate (LOPRESSOR) 25 MG tablet Take 25 mg by mouth 2 (two) times daily.      . montelukast (SINGULAIR) 10 MG tablet Take 10 mg by mouth daily.       Vladimir Faster Glycol-Propyl Glycol (SYSTANE PRESERVATIVE FREE OP) Place 1 drop into both eyes 4 (four) times daily.      Vladimir Faster Glycol-Propyl Glycol (SYSTANE) 0.4-0.3 % GEL Place 1 application into both eyes at bedtime.      . potassium chloride SA (K-DUR,KLOR-CON) 20 MEQ tablet Take 20 mEq by mouth 2 (two) times daily.      . pravastatin (PRAVACHOL) 20 MG tablet Take 20 mg by mouth at bedtime.      . ranitidine (ZANTAC) 75 MG tablet Take 75 mg by mouth 2 (two) times daily.      . Rivaroxaban (XARELTO) 15 MG TABS tablet Take 15 mg by mouth daily with supper.      . traMADol (ULTRAM) 50 MG tablet Take 50 mg by mouth every 6 (six) hours as needed.      . valsartan (DIOVAN) 160 MG tablet Take  160 mg by mouth daily.       No current facility-administered medications for this visit.    Allergies as  of 08/30/2013  . (No Known Allergies)    Vitals: BP 86/63  Pulse 102 Last Weight:  Wt Readings from Last 1 Encounters:  10/10/12 178 lb (80.74 kg)   Last Height:   Ht Readings from Last 1 Encounters:  10/10/12 4\' 11"  (1.499 m)     Physical exam: Exam: Gen: NAD, conversant Eyes: anicteric sclerae, moist conjunctivae HENT: Atraumatic, oropharynx clear Neck: Trachea midline; supple,  Lungs: CTA, no wheezing, rales, rhonic                          CV: RRR, no MRG Abdomen: Soft, non-tender;  Extremities: No peripheral edema  Skin: Normal temperature, no rash,  Psych: Appropriate affect, pleasant  Neuro: MS: Alert oriented to name, follows simple one commands  CN: PERRL, EOMI no nystagmus, no ptosis, sensation intact to LT V1-V3 bilat, face symmetric, no weakness, hearing grossly intact, palate elevates symmetrically, shoulder shrug 5/5 bilat,  tongue protrudes midline, no fasiculations noted.  Motor: normal bulk and tone Unable to fully assess due to mental status, appears to move right eye left than left but able to move both sides against light resistance  Coord: Unable to assess   Reflexes: Brisk right-sided reflexes   Sens: LT intact in all extremities  Gait: Patient and wheelchair, unsteady did not check day at this time   Assessment:  After physical and neurologic examination, review of laboratory studies, imaging, neurophysiology testing and pre-existing records, assessment will be reviewed on the problem list.  Plan:  Treatment plan and additional workup will be reviewed under Problem List.  1)AMS 2)remote CVA 51)A fib  80 year old woman with history of atrial fibrillation prior stroke presenting for initial evaluation of altered mental status. Patient is currently resident of nursing home, comes today with no family or caregivers present. Difficult to get a full history. Much of information is based on the ED note from 07/23/2013. Suspect patient's altered  mental status is likely multifactorial, due to underlying infectious process, metabolic process and prior ischemic stroke. Suggest checking for metabolic abnormalities, infectious process, ammonia level, thyroid, B12. If mental status continues to fluctuate would check EEG. No indication for repeat head imaging at this time. Continue patient on Xarelto for stroke prevention.  Jim Like, DO  [facility] [address] [place], [place] [address]  Phone [phone] Fax [phone]

## 2013-09-12 ENCOUNTER — Other Ambulatory Visit: Payer: Self-pay | Admitting: Vascular Surgery

## 2013-09-12 DIAGNOSIS — I6529 Occlusion and stenosis of unspecified carotid artery: Secondary | ICD-10-CM

## 2013-10-16 ENCOUNTER — Ambulatory Visit: Payer: Medicare Other | Admitting: Vascular Surgery

## 2013-10-16 ENCOUNTER — Other Ambulatory Visit (HOSPITAL_COMMUNITY): Payer: Medicare Other

## 2013-11-12 ENCOUNTER — Encounter: Payer: Self-pay | Admitting: Vascular Surgery

## 2013-11-13 ENCOUNTER — Ambulatory Visit: Payer: Medicare Other | Admitting: Vascular Surgery

## 2013-11-13 ENCOUNTER — Ambulatory Visit (HOSPITAL_COMMUNITY)
Admission: RE | Admit: 2013-11-13 | Discharge: 2013-11-13 | Disposition: A | Discharge status: Home / Self Care | Payer: Medicare Other | Source: Ambulatory Visit | Attending: Vascular Surgery | Admitting: Vascular Surgery

## 2013-11-13 ENCOUNTER — Ambulatory Visit (INDEPENDENT_AMBULATORY_CARE_PROVIDER_SITE_OTHER): Payer: Medicare Other | Admitting: Vascular Surgery

## 2013-11-13 ENCOUNTER — Other Ambulatory Visit (HOSPITAL_COMMUNITY): Payer: Medicare Other

## 2013-11-13 ENCOUNTER — Encounter: Payer: Self-pay | Admitting: Vascular Surgery

## 2013-11-13 VITALS — BP 115/66 | HR 95 | Ht 59.0 in | Wt 168.0 lb

## 2013-11-13 DIAGNOSIS — I6529 Occlusion and stenosis of unspecified carotid artery: Secondary | ICD-10-CM

## 2013-11-13 NOTE — Progress Notes (Signed)
[facility]  Patient name: Joanna Hill MRN: [id number] DOB: [birth year] Sex: female  REASON FOR VISIT: Follow-up of carotid disease.  HPI: Joanna Hill is a 80 y.o. female who was last seen in our office in February of 2014. At that time she had an asymptomatic 40-59% right carotid stenosis. She comes in for a one year follow up visit. She's had a previous left brain stroke approximately 7 years ago which was associated with some right sided weakness. She has some expressive aphasia.  She's had no new neurologic symptoms since her last visit. She is at [employer] She does complain of some arthritic pain in her knees and right arm.   Past Medical History  Diagnosis Date  . Carotid artery occlusion   . Diabetes mellitus   . Hypertension   . Hyperlipidemia   . Atrial fibrillation   . Blindness   . Cancer     breast  . Stroke 2009 and  2011    difficulty with swallowing   Family History  Problem Relation Age of Onset  . Cancer Mother   . Heart disease Father     Heart disease before age 55   SOCIAL HISTORY: History  Substance Use Topics  . Smoking status: Never Smoker   . Smokeless tobacco: Never Used  . Alcohol Use: No   No Known Allergies Current Outpatient Prescriptions  Medication Sig Dispense Refill  . acetaminophen (TYLENOL) 325 MG tablet Take 650 mg by mouth every 6 (six) hours as needed for mild pain, fever or headache.       . albuterol (PROVENTIL) (2.5 MG/3ML) 0.083% nebulizer solution Take 2.5 mg by nebulization every 6 (six) hours as needed for wheezing or shortness of breath.      Marland Kitchen amLODipine (NORVASC) 10 MG tablet Take 10 mg by mouth daily.      . calcium-vitamin D (OSCAL WITH D) 500-200 MG-UNIT per tablet Take 1 tablet by mouth daily.      . cephALEXin (KEFLEX) 500 MG capsule Take 500 mg by mouth 4 (four) times daily.      . Cholecalciferol (VITAMIN D-3) 1000 UNITS CAPS Take 1,000 Units by mouth daily.      . ciprofloxacin  (CIPRO) 500 MG tablet Take 500 mg by mouth 2 (two) times daily.      . citalopram (CELEXA) 40 MG tablet Take 40 mg by mouth daily.      Marland Kitchen docusate sodium (COLACE) 100 MG capsule Take 100 mg by mouth 2 (two) times daily.      . DULoxetine (CYMBALTA) 30 MG capsule Take 30-60 mg by mouth 2 (two) times daily. 30 mg at 9am & 60mg  at 6pm      . escitalopram (LEXAPRO) 20 MG tablet Take 20 mg by mouth daily.      . fluticasone (FLONASE) 50 MCG/ACT nasal spray Place into both nostrils daily.      . furosemide (LASIX) 20 MG tablet Take 20 mg by mouth 2 (two) times daily.      . furosemide (LASIX) 40 MG tablet Take 40 mg by mouth.      . gabapentin (NEURONTIN) 100 MG capsule Take 200 mg by mouth 2 (two) times daily.      . insulin glargine (LANTUS) 100 UNIT/ML injection Inject 20-22 Units into the skin 2 (two) times daily. 20 units at 8 am in the morning and 22 units at 5pm.      . insulin lispro (HUMALOG) 100  UNIT/ML injection Inject 2-4 Units into the skin 3 (three) times daily before meals. <120=0  120-150=2 units  >151=4 units      . insulin lispro (HUMALOG) 100 UNIT/ML injection Inject 5 Units into the skin daily with breakfast.      . ketotifen (ZADITOR) 0.025 % ophthalmic solution Place 1 drop into both eyes 2 (two) times daily.       Marland Kitchen latanoprost (XALATAN) 0.005 % ophthalmic solution Place 1 drop into both eyes at bedtime.       Marland Kitchen levothyroxine (SYNTHROID, LEVOTHROID) 50 MCG tablet Take 50 mcg by mouth daily.      Marland Kitchen loratadine (CLARITIN) 10 MG tablet Take 10 mg by mouth daily.      Marland Kitchen LORazepam (ATIVAN) 0.5 MG tablet Take 0.5 mg by mouth 2 (two) times daily.      . metoprolol tartrate (LOPRESSOR) 25 MG tablet Take 25 mg by mouth 2 (two) times daily.      . montelukast (SINGULAIR) 10 MG tablet Take 10 mg by mouth daily.       Vladimir Faster Glycol-Propyl Glycol (SYSTANE PRESERVATIVE FREE OP) Place 1 drop into both eyes 4 (four) times daily.      Vladimir Faster Glycol-Propyl Glycol (SYSTANE) 0.4-0.3 % GEL  Place 1 application into both eyes at bedtime.      . potassium chloride SA (K-DUR,KLOR-CON) 20 MEQ tablet Take 20 mEq by mouth 2 (two) times daily.      . pravastatin (PRAVACHOL) 20 MG tablet Take 20 mg by mouth at bedtime.      . ranitidine (ZANTAC) 75 MG tablet Take 75 mg by mouth 2 (two) times daily.      . Rivaroxaban (XARELTO) 15 MG TABS tablet Take 15 mg by mouth daily with supper.      . traMADol (ULTRAM) 50 MG tablet Take 50 mg by mouth every 6 (six) hours as needed.      . valsartan (DIOVAN) 160 MG tablet Take 160 mg by mouth daily.       No current facility-administered medications for this visit.   REVIEW OF SYSTEMS: Valu.Nieves ] denotes positive finding; [  ] denotes negative finding  CARDIOVASCULAR:  [ ]  chest pain   [ ]  chest pressure   [ ]  palpitations   [ ]  orthopnea   Valu.Nieves ] dyspnea on exertion   [ ]  claudication   [ ]  rest pain   [ ]  DVT   [ ]  phlebitis PULMONARY:   [ ]  productive cough   [ ]  asthma   [ ]  wheezing NEUROLOGIC:   [ ]  weakness  [ ]  paresthesias  [ ]  aphasia  [ ]  amaurosis  [ ]  dizziness HEMATOLOGIC:   [ ]  bleeding problems   [ ]  clotting disorders MUSCULOSKELETAL:  Valu.Nieves ] joint pain   [ ]  joint swelling [ ]  leg swelling GASTROINTESTINAL: [ ]   blood in stool  [ ]   hematemesis GENITOURINARY:  [ ]   dysuria  [ ]   hematuria PSYCHIATRIC:  [ ]  history of major depression INTEGUMENTARY:  [ ]  rashes  [ ]  ulcers CONSTITUTIONAL:  [ ]  fever   [ ]  chills  PHYSICAL EXAM: Filed Vitals:   11/13/13 1142  BP: 115/66  Pulse: 95  Height: 4\' 11"  (1.499 m)  Weight: 168 lb (76.204 kg)  SpO2: 100%   Body mass index is 33.91 kg/(m^2). GENERAL: The patient is a well-nourished female, in no acute distress. The vital signs are documented above. CARDIOVASCULAR: There is  a regular rate and rhythm. I do not detect carotid bruits. PULMONARY: There is good air exchange bilaterally without wheezing or rales. ABDOMEN: Soft and non-tender with normal pitched bowel sounds.  MUSCULOSKELETAL:  There are no major deformities or cyanosis. NEUROLOGIC: No focal weakness or paresthesias are detected. She does have some expressive aphasia. SKIN: There are no ulcers or rashes noted. PSYCHIATRIC: The patient has a normal affect.  DATA:  CAROTID DUPLEX SCAN: I have independently interpreted her carotid duplex scan which is a 40-59% right carotid stenosis with no significant stenosis on the left. Both vertebral arteries are patent with normally directed flow.  MEDICAL ISSUES: Occlusion and stenosis of carotid artery without mention of cerebral infarction The patient has an asymptomatic 40-59% right carotid stenosis. She understands we would not consider carotid endarterectomy unless the stenosis progressed to greater than 80%. Ordered a fall carotid duplex scan in 1 year and will see her back at that time. She knows to call sooner she has problems.   Return in about 1 year (around 11/14/2014).   [facility] [place] Beeper: [phone]

## 2013-11-13 NOTE — Assessment & Plan Note (Signed)
The patient has an asymptomatic 40-59% right carotid stenosis. She understands we would not consider carotid endarterectomy unless the stenosis progressed to greater than 80%. Ordered a fall carotid duplex scan in 1 year and will see her back at that time. She knows to call sooner she has problems.

## 2013-11-13 NOTE — Addendum Note (Signed)
Addended by: Dorthula Rue L on: 11/13/2013 05:37 PM   Modules accepted: Orders

## 2014-01-18 ENCOUNTER — Encounter (HOSPITAL_COMMUNITY): Payer: Self-pay | Admitting: Emergency Medicine

## 2014-01-18 ENCOUNTER — Inpatient Hospital Stay (HOSPITAL_COMMUNITY)
Admission: EM | Admit: 2014-01-18 | Discharge: 2014-01-19 | DRG: 641 | Disposition: A | Discharge status: Skilled Nursing Facility | Payer: Medicare Other | Attending: Endocrinology | Admitting: Endocrinology

## 2014-01-18 ENCOUNTER — Emergency Department (HOSPITAL_COMMUNITY): Payer: Medicare Other

## 2014-01-18 DIAGNOSIS — Z8673 Personal history of transient ischemic attack (TIA), and cerebral infarction without residual deficits: Secondary | ICD-10-CM

## 2014-01-18 DIAGNOSIS — I4891 Unspecified atrial fibrillation: Secondary | ICD-10-CM | POA: Diagnosis present

## 2014-01-18 DIAGNOSIS — I10 Essential (primary) hypertension: Secondary | ICD-10-CM | POA: Diagnosis present

## 2014-01-18 DIAGNOSIS — I509 Heart failure, unspecified: Secondary | ICD-10-CM | POA: Diagnosis present

## 2014-01-18 DIAGNOSIS — Z853 Personal history of malignant neoplasm of breast: Secondary | ICD-10-CM

## 2014-01-18 DIAGNOSIS — Z794 Long term (current) use of insulin: Secondary | ICD-10-CM

## 2014-01-18 DIAGNOSIS — N179 Acute kidney failure, unspecified: Secondary | ICD-10-CM | POA: Diagnosis present

## 2014-01-18 DIAGNOSIS — Z7901 Long term (current) use of anticoagulants: Secondary | ICD-10-CM

## 2014-01-18 DIAGNOSIS — R079 Chest pain, unspecified: Secondary | ICD-10-CM

## 2014-01-18 DIAGNOSIS — Z79899 Other long term (current) drug therapy: Secondary | ICD-10-CM

## 2014-01-18 DIAGNOSIS — Z9010 Acquired absence of unspecified breast and nipple: Secondary | ICD-10-CM

## 2014-01-18 DIAGNOSIS — E875 Hyperkalemia: Principal | ICD-10-CM | POA: Diagnosis present

## 2014-01-18 DIAGNOSIS — E785 Hyperlipidemia, unspecified: Secondary | ICD-10-CM | POA: Diagnosis present

## 2014-01-18 DIAGNOSIS — Z8249 Family history of ischemic heart disease and other diseases of the circulatory system: Secondary | ICD-10-CM

## 2014-01-18 DIAGNOSIS — N289 Disorder of kidney and ureter, unspecified: Secondary | ICD-10-CM

## 2014-01-18 DIAGNOSIS — E119 Type 2 diabetes mellitus without complications: Secondary | ICD-10-CM | POA: Diagnosis present

## 2014-01-18 DIAGNOSIS — M542 Cervicalgia: Secondary | ICD-10-CM | POA: Diagnosis present

## 2014-01-18 LAB — BASIC METABOLIC PANEL
BUN: 37 mg/dL — ABNORMAL HIGH (ref 6–23)
CHLORIDE: 97 meq/L (ref 96–112)
CO2: 26 mEq/L (ref 19–32)
CREATININE: 1.45 mg/dL — AB (ref 0.50–1.10)
Calcium: 10.1 mg/dL (ref 8.4–10.5)
GFR calc non Af Amer: 33 mL/min — ABNORMAL LOW (ref >90)
GFR, EST AFRICAN AMERICAN: 39 mL/min — AB (ref >90)
Glucose, Bld: 284 mg/dL — ABNORMAL HIGH (ref 70–99)
Potassium: 6.3 mEq/L — ABNORMAL HIGH (ref 3.7–5.3)
Sodium: 134 mEq/L — ABNORMAL LOW (ref 137–147)

## 2014-01-18 LAB — I-STAT TROPONIN, ED
TROPONIN I, POC: 0 ng/mL (ref 0.00–0.08)
Troponin i, poc: 0 ng/mL (ref 0.00–0.08)

## 2014-01-18 LAB — CBC
HCT: 36.9 % (ref 36.0–46.0)
Hemoglobin: 11.7 g/dL — ABNORMAL LOW (ref 12.0–15.0)
MCH: 28.1 pg (ref 26.0–34.0)
MCHC: 31.7 g/dL (ref 30.0–36.0)
MCV: 88.7 fL (ref 78.0–100.0)
Platelets: 209 10*3/uL (ref 150–400)
RBC: 4.16 MIL/uL (ref 3.87–5.11)
RDW: 14.6 % (ref 11.5–15.5)
WBC: 12.1 10*3/uL — AB (ref 4.0–10.5)

## 2014-01-18 LAB — APTT: aPTT: 33 seconds (ref 24–37)

## 2014-01-18 LAB — D-DIMER, QUANTITATIVE: D-Dimer, Quant: 0.4 ug/mL-FEU (ref 0.00–0.48)

## 2014-01-18 LAB — PROTIME-INR
INR: 1.19 (ref 0.00–1.49)
Prothrombin Time: 14.8 seconds (ref 11.6–15.2)

## 2014-01-18 MED ORDER — ASPIRIN 81 MG PO CHEW
324.0000 mg | CHEWABLE_TABLET | Freq: Once | ORAL | Status: AC
  Administered 2014-01-18: 324 mg via ORAL
  Filled 2014-01-18: qty 4

## 2014-01-18 MED ORDER — SODIUM CHLORIDE 0.9 % IV SOLN
1000.0000 mL | INTRAVENOUS | Status: DC
  Administered 2014-01-18: 1000 mL via INTRAVENOUS

## 2014-01-18 NOTE — ED Notes (Signed)
Pt back from X-ray.  

## 2014-01-18 NOTE — ED Provider Notes (Signed)
CSN: [id number]     Arrival date & time 01/18/14  1910 History   First MD Initiated Contact with Patient 01/18/14 2003     Chief Complaint  Patient presents with  . Chest Pain   Patient is a 80 y.o. female presenting with chest pain.  Chest Pain Pain location:  L chest and R chest Pain quality: aching   Pain severity:  Moderate Onset quality:  Gradual Progression:  Improving (She was given tylenol at her nursing home whcih helped.) Associated symptoms: fever   Associated symptoms: no abdominal pain, no cough, no diaphoresis, no nausea and not vomiting   Risk factors: no coronary artery disease and no prior DVT/PE    Pt started having pain in her chest around lunch time today (noon).  The pain is on both sides of her chest.   THe pain is aching in nature and goes to her neck.  She feels some shortness of breath.  No nausea.  No cough.   Past Medical History  Diagnosis Date  . Carotid artery occlusion   . Diabetes mellitus   . Hypertension   . Hyperlipidemia   . Atrial fibrillation   . Blindness   . Cancer     breast  . Stroke 2009 and  2011    difficulty with swallowing   Past Surgical History  Procedure Laterality Date  . Arch aortogram  03-15-2010  . Mastectomy, partial Right    Family History  Problem Relation Age of Onset  . Cancer Mother   . Heart disease Father     Heart disease before age 5   History  Substance Use Topics  . Smoking status: Never Smoker   . Smokeless tobacco: Never Used  . Alcohol Use: No   OB History   Grav Para Term Preterm Abortions TAB SAB Ect Mult Living                 Review of Systems  Constitutional: Positive for fever. Negative for diaphoresis.  Respiratory: Negative for cough.   Cardiovascular: Positive for chest pain.  Gastrointestinal: Negative for nausea, vomiting and abdominal pain.      Allergies  Review of patient's allergies indicates no known allergies.  Home Medications   Prior to Admission medications    Medication Sig Start Date End Date Taking? Authorizing Provider  acetaminophen (TYLENOL) 325 MG tablet Take 650 mg by mouth every 6 (six) hours as needed for mild pain, fever or headache.    Yes Historical Provider, MD  amLODipine (NORVASC) 10 MG tablet Take 10 mg by mouth daily.   Yes Historical Provider, MD  calcium-vitamin D (OSCAL WITH D) 500-200 MG-UNIT per tablet Take 1 tablet by mouth daily.   Yes Historical Provider, MD  Cholecalciferol (VITAMIN D-3) 1000 UNITS CAPS Take 1,000 Units by mouth daily.   Yes Historical Provider, MD  docusate sodium (COLACE) 100 MG capsule Take 100 mg by mouth 2 (two) times daily.   Yes Historical Provider, MD  DULoxetine (CYMBALTA) 30 MG capsule Take 30-60 mg by mouth 2 (two) times daily. 30 mg at 9am & 60mg  at 6pm   Yes Historical Provider, MD  fluticasone (FLONASE) 50 MCG/ACT nasal spray Place into both nostrils daily.   Yes Historical Provider, MD  furosemide (LASIX) 20 MG tablet Take 20 mg by mouth 2 (two) times daily.   Yes Historical Provider, MD  gabapentin (NEURONTIN) 100 MG capsule Take 200 mg by mouth 2 (two) times daily.   Yes Historical Provider,  MD  insulin glargine (LANTUS) 100 UNIT/ML injection Inject 20-22 Units into the skin 2 (two) times daily. 20 units at 8 am in the morning and 22 units at 5pm.   Yes Historical Provider, MD  insulin lispro (HUMALOG) 100 UNIT/ML injection Inject 2-4 Units into the skin 3 (three) times daily before meals. <120=0  120-150=2 units  >151=4 units   Yes Historical Provider, MD  insulin lispro (HUMALOG) 100 UNIT/ML injection Inject 5 Units into the skin daily with breakfast.   Yes Historical Provider, MD  ketotifen (ZADITOR) 0.025 % ophthalmic solution Place 1 drop into both eyes 2 (two) times daily.    Yes Historical Provider, MD  latanoprost (XALATAN) 0.005 % ophthalmic solution Place 1 drop into both eyes at bedtime.    Yes Historical Provider, MD  levothyroxine (SYNTHROID, LEVOTHROID) 50 MCG tablet Take 50 mcg by  mouth daily.   Yes Historical Provider, MD  loratadine (CLARITIN) 10 MG tablet Take 10 mg by mouth daily.   Yes Historical Provider, MD  metoprolol tartrate (LOPRESSOR) 25 MG tablet Take 25 mg by mouth 2 (two) times daily.   Yes Historical Provider, MD  montelukast (SINGULAIR) 10 MG tablet Take 10 mg by mouth daily.    Yes Historical Provider, MD  Polyethyl Glycol-Propyl Glycol (SYSTANE PRESERVATIVE FREE OP) Place 1 drop into both eyes 4 (four) times daily.   Yes Historical Provider, MD  Polyethyl Glycol-Propyl Glycol (SYSTANE) 0.4-0.3 % GEL Place 1 application into both eyes at bedtime.   Yes Historical Provider, MD  potassium chloride SA (K-DUR,KLOR-CON) 20 MEQ tablet Take 20 mEq by mouth 2 (two) times daily.   Yes Historical Provider, MD  pravastatin (PRAVACHOL) 20 MG tablet Take 20 mg by mouth at bedtime.   Yes Historical Provider, MD  Rivaroxaban (XARELTO) 15 MG TABS tablet Take 15 mg by mouth daily with supper.   Yes Historical Provider, MD  valsartan (DIOVAN) 160 MG tablet Take 160 mg by mouth daily.   Yes Historical Provider, MD   BP 111/62  Pulse 85  Temp(Src) 98.7 F (37.1 C) (Oral)  Resp 21  SpO2 95% Physical Exam  Nursing note and vitals reviewed. Constitutional: She appears well-developed and well-nourished. No distress.  HENT:  Head: Normocephalic and atraumatic.  Right Ear: External ear normal.  Left Ear: External ear normal.  Eyes: Conjunctivae are normal. Right eye exhibits no discharge. Left eye exhibits no discharge. No scleral icterus.  Neck: Neck supple. No tracheal deviation present.  Cardiovascular: Normal rate, regular rhythm and intact distal pulses.   Pulmonary/Chest: Effort normal and breath sounds normal. No stridor. No respiratory distress. She has no wheezes. She has no rales. She exhibits tenderness. She exhibits no crepitus.  Abdominal: Soft. Bowel sounds are normal. She exhibits no distension. There is no tenderness. There is no rebound and no guarding.   Musculoskeletal: She exhibits edema (mild bilateral). She exhibits no tenderness.  Neurological: She is alert. She has normal strength. No cranial nerve deficit (no facial droop, extraocular movements intact, no slurred speech) or sensory deficit. She exhibits normal muscle tone. She displays no seizure activity. Coordination normal.  Skin: Skin is warm and dry. No rash noted.  Psychiatric: She has a normal mood and affect.    ED Course  Procedures (including critical care time) Labs Review Labs Reviewed  CBC - Abnormal; Notable for the following:    WBC 12.1 (*)    Hemoglobin 11.7 (*)    All other components within normal limits  BASIC METABOLIC  PANEL - Abnormal; Notable for the following:    Sodium 134 (*)    Potassium 6.3 (*)    Glucose, Bld 284 (*)    BUN 37 (*)    Creatinine, Ser 1.45 (*)    GFR calc non Af Amer 33 (*)    GFR calc Af Amer 39 (*)    All other components within normal limits  APTT  PROTIME-INR  D-DIMER, QUANTITATIVE  I-STAT TROPOININ, ED  I-STAT TROPOININ, ED  I-STAT TROPOININ, ED    Imaging Review Dg Chest 2 View  01/18/2014   CLINICAL DATA:  Chest pain; history of coronary artery disease and atrial fibrillation  EXAM: CHEST  2 VIEW  COMPARISON:  PA and lateral chest x-ray of March 01, 2010  FINDINGS: The lungs are mildly hypoinflated and exhibit mildly increased interstitial markings especially on the left. The cardiac silhouette is enlarged and the central pulmonary vascularity is mildly prominent. There is no pleural effusion. The bony thorax is unremarkable.  IMPRESSION: The findings suggest low-grade CHF with mild interstitial edema. There is no alveolar edema nor pleural effusion.   Electronically Signed   By: David  Martinique   On: 01/18/2014 22:14     EKG Interpretation   Date/Time:  Saturday January 18 2014 19:20:28 EDT Ventricular Rate:  103 PR Interval:    QRS Duration: 89 QT Interval:  375 QTC Calculation: 491 R Axis:   67 Text Interpretation:   Atrial fibrillation Low voltage, extremity and  precordial leads Nonspecific T abnormalities, lateral leads Borderline  prolonged QT interval No significant change since last tracing Confirmed  by Adelayde Minney  MD-J, Bradey Luzier ([id number]) on 01/18/2014 7:27:48 PM      MDM   Final diagnoses:  Chest pain  Hyperkalemia  Renal insufficiency    Pt has acute renal insufficiency compared to prior labs.  Potassium is elevated as well without mention of hemolysis.  No EKG changes. ? Med related.  Pt does not mention nausea or vomiting.  Will admit, IV fluids, serial cardiac enzymes and monitoring of her potassium.   Dorie Rank, MD 01/18/14 [phone]

## 2014-01-18 NOTE — ED Notes (Signed)
Pt is from [place] with c/o chest and neck pain that started today around lunch time. Pt states she was going to lunch when she began having the pain. Per EMS pt is tender upon palpation. Pt was given tylenol at the facility and it helped some but pain came back.

## 2014-01-18 NOTE — H&P (Addendum)
Physician Admission History and Physical     PCP:   Sheela Stack, MD   Chief Complaint:  Neck pain and some dyspnea   HPI: Joanna Hill is an 80 y.o. female.  Pt w/ PMH of HTN, HLD, DM and Carotid stenosis who presents from [facility] b/c of presentation of neck pain. She has hyperkalemia in the ED (on kdur 20 mEq BID at [facility]), pseudohyponatremia w/ hyperglycemia, a fib w/ HR in 90-100s, mild leukocytosis, mild renal failure w/ increased BUN/Cr ratio and normal vitals. Trop neg. CXR w/ pleural fluid but no effusion. She states her pain is mildly relieved w/ APAP. TTP today on chest. 1+ pitting edema of B/L LE   Review of Systems:  Neg except as noted above  Past Medical History: Past Medical History  Diagnosis Date  . Carotid artery occlusion   . Diabetes mellitus   . Hypertension   . Hyperlipidemia   . Atrial fibrillation   . Blindness   . Cancer     breast  . Stroke 2009 and  2011    difficulty with swallowing   Past Surgical History  Procedure Laterality Date  . Arch aortogram  03-15-2010  . Mastectomy, partial Right     Medications: Prior to Admission medications   Medication Sig Start Date End Date Taking? Authorizing Provider  acetaminophen (TYLENOL) 325 MG tablet Take 650 mg by mouth every 6 (six) hours as needed for mild pain, fever or headache.    Yes Historical Provider, MD  amLODipine (NORVASC) 10 MG tablet Take 10 mg by mouth daily.   Yes Historical Provider, MD  calcium-vitamin D (OSCAL WITH D) 500-200 MG-UNIT per tablet Take 1 tablet by mouth daily.   Yes Historical Provider, MD  Cholecalciferol (VITAMIN D-3) 1000 UNITS CAPS Take 1,000 Units by mouth daily.   Yes Historical Provider, MD  docusate sodium (COLACE) 100 MG capsule Take 100 mg by mouth 2 (two) times daily.   Yes Historical Provider, MD  DULoxetine (CYMBALTA) 30 MG capsule Take 30-60 mg by mouth 2 (two) times daily. 30 mg at 9am & 60mg  at 6pm   Yes Historical Provider, MD  fluticasone  (FLONASE) 50 MCG/ACT nasal spray Place into both nostrils daily.   Yes Historical Provider, MD  furosemide (LASIX) 20 MG tablet Take 20 mg by mouth 2 (two) times daily.   Yes Historical Provider, MD  gabapentin (NEURONTIN) 100 MG capsule Take 200 mg by mouth 2 (two) times daily.   Yes Historical Provider, MD  insulin glargine (LANTUS) 100 UNIT/ML injection Inject 20-22 Units into the skin 2 (two) times daily. 20 units at 8 am in the morning and 22 units at 5pm.   Yes Historical Provider, MD  insulin lispro (HUMALOG) 100 UNIT/ML injection Inject 2-4 Units into the skin 3 (three) times daily before meals. <120=0  120-150=2 units  >151=4 units   Yes Historical Provider, MD  insulin lispro (HUMALOG) 100 UNIT/ML injection Inject 5 Units into the skin daily with breakfast.   Yes Historical Provider, MD  ketotifen (ZADITOR) 0.025 % ophthalmic solution Place 1 drop into both eyes 2 (two) times daily.    Yes Historical Provider, MD  latanoprost (XALATAN) 0.005 % ophthalmic solution Place 1 drop into both eyes at bedtime.    Yes Historical Provider, MD  levothyroxine (SYNTHROID, LEVOTHROID) 50 MCG tablet Take 50 mcg by mouth daily.   Yes Historical Provider, MD  loratadine (CLARITIN) 10 MG tablet Take 10 mg by mouth daily.   Yes  Historical Provider, MD  metoprolol tartrate (LOPRESSOR) 25 MG tablet Take 25 mg by mouth 2 (two) times daily.   Yes Historical Provider, MD  montelukast (SINGULAIR) 10 MG tablet Take 10 mg by mouth daily.    Yes Historical Provider, MD  Polyethyl Glycol-Propyl Glycol (SYSTANE PRESERVATIVE FREE OP) Place 1 drop into both eyes 4 (four) times daily.   Yes Historical Provider, MD  Polyethyl Glycol-Propyl Glycol (SYSTANE) 0.4-0.3 % GEL Place 1 application into both eyes at bedtime.   Yes Historical Provider, MD  potassium chloride SA (K-DUR,KLOR-CON) 20 MEQ tablet Take 20 mEq by mouth 2 (two) times daily.   Yes Historical Provider, MD  pravastatin (PRAVACHOL) 20 MG tablet Take 20 mg by  mouth at bedtime.   Yes Historical Provider, MD  Rivaroxaban (XARELTO) 15 MG TABS tablet Take 15 mg by mouth daily with supper.   Yes Historical Provider, MD  valsartan (DIOVAN) 160 MG tablet Take 160 mg by mouth daily.   Yes Historical Provider, MD    Allergies:  No Known Allergies  Social History:  reports that she has never smoked. She has never used smokeless tobacco. She reports that she does not drink alcohol or use illicit drugs.  Family History: Family History  Problem Relation Age of Onset  . Cancer Mother   . Heart disease Father     Heart disease before age 30    Physical Exam: Filed Vitals:   01/18/14 2000 01/18/14 2015 01/18/14 2030 01/18/14 2045  BP: 120/67 116/70 125/76 118/75  Pulse: 94 113 98 104  Temp:      TempSrc:      Resp: 23 25 26 26   SpO2: 98% 97% 96% 97%   Gen: WF in NAD  HEENT: MMM, anicteric  Lungs: CTAB, min rales in bases  Cardio:  Ir/ir, reg rate, no MRG  Abd:  Soft, NT, ND  MSK:  No focal deficits  Neuro:  No focal deficits  Skin: warm, dry     Labs on Admission:   Recent Labs  01/18/14 1927  NA 134*  K 6.3*  CL 97  CO2 26  GLUCOSE 284*  BUN 37*  CREATININE 1.45*  CALCIUM 10.1   No results found for this basename: AST, ALT, ALKPHOS, BILITOT, PROT, ALBUMIN,  in the last 72 hours No results found for this basename: LIPASE, AMYLASE,  in the last 72 hours  Recent Labs  01/18/14 1927  WBC 12.1*  HGB 11.7*  HCT 36.9  MCV 88.7  PLT 209   No results found for this basename: CKTOTAL, CKMB, CKMBINDEX, TROPONINI,  in the last 72 hours Lab Results  Component Value Date   INR 1.19 01/18/2014   INR 1.60* 07/11/2013   INR 1.43 03/15/2010   No results found for this basename: TSH, T4TOTAL, FREET3, T3FREE, THYROIDAB,  in the last 72 hours No results found for this basename: VITAMINB12, FOLATE, FERRITIN, TIBC, IRON, RETICCTPCT,  in the last 72 hours  Radiological Exams on Admission: Dg Chest 2 View  01/18/2014   CLINICAL DATA:   Chest pain; history of coronary artery disease and atrial fibrillation  EXAM: CHEST  2 VIEW  COMPARISON:  PA and lateral chest x-ray of March 01, 2010  FINDINGS: The lungs are mildly hypoinflated and exhibit mildly increased interstitial markings especially on the left. The cardiac silhouette is enlarged and the central pulmonary vascularity is mildly prominent. There is no pleural effusion. The bony thorax is unremarkable.  IMPRESSION: The findings suggest low-grade CHF with  mild interstitial edema. There is no alveolar edema nor pleural effusion.   Electronically Signed   By: David  Martinique   On: 01/18/2014 22:14   Orders placed during the hospital encounter of 01/18/14  . EKG 12-LEAD  . EKG 12-LEAD  . ED EKG  . ED EKG  . ED EKG  . ED EKG    Assessment/Plan  Hyperkalemia - hold supplements. One dose of IV lasix tonight. Will cont ACE/ARB at this time. Daily Bmet. EKG in AM.   A fib - home meds. Rate controlled . xarelto   HTN - home meds   Neck pain - check trop x 2 . Repeat EKG in AM. TTE to r/o wall motion abnormality   CHF - cont home lasix + one dose of IV lasix tonight   DM - basal + SSI   PPx - on xarelto   Dispo - admit to tele. Likely home after 24 hours if CP r/o, and hyperkalemia resolves    [employer] 01/18/2014, 10:58 PM

## 2014-01-19 LAB — CBC
HCT: 36.4 % (ref 36.0–46.0)
Hemoglobin: 11.5 g/dL — ABNORMAL LOW (ref 12.0–15.0)
MCH: 28.2 pg (ref 26.0–34.0)
MCHC: 31.6 g/dL (ref 30.0–36.0)
MCV: 89.2 fL (ref 78.0–100.0)
PLATELETS: 194 10*3/uL (ref 150–400)
RBC: 4.08 MIL/uL (ref 3.87–5.11)
RDW: 14.5 % (ref 11.5–15.5)
WBC: 7.5 10*3/uL (ref 4.0–10.5)

## 2014-01-19 LAB — BASIC METABOLIC PANEL
BUN: 28 mg/dL — ABNORMAL HIGH (ref 6–23)
CALCIUM: 9.5 mg/dL (ref 8.4–10.5)
CO2: 25 mEq/L (ref 19–32)
Chloride: 102 mEq/L (ref 96–112)
Creatinine, Ser: 1.12 mg/dL — ABNORMAL HIGH (ref 0.50–1.10)
GFR calc Af Amer: 53 mL/min — ABNORMAL LOW (ref >90)
GFR, EST NON AFRICAN AMERICAN: 45 mL/min — AB (ref >90)
Glucose, Bld: 188 mg/dL — ABNORMAL HIGH (ref 70–99)
Potassium: 4.6 mEq/L (ref 3.7–5.3)
Sodium: 140 mEq/L (ref 137–147)

## 2014-01-19 LAB — TROPONIN I: Troponin I: <0.3 ng/mL (ref <0.30)

## 2014-01-19 LAB — GLUCOSE, CAPILLARY
GLUCOSE-CAPILLARY: 243 mg/dL — AB (ref 70–99)
Glucose-Capillary: 178 mg/dL — ABNORMAL HIGH (ref 70–99)
Glucose-Capillary: 141 mg/dL — ABNORMAL HIGH (ref 70–99)

## 2014-01-19 MED ORDER — POLYETHYL GLYCOL-PROPYL GLYCOL 0.4-0.3 % OP GEL: 1.0000 "application " | Freq: Every day | OPHTHALMIC | Status: DC

## 2014-01-19 MED ORDER — FUROSEMIDE 20 MG PO TABS
20.0000 mg | ORAL_TABLET | Freq: Two times a day (BID) | ORAL | Status: DC
  Administered 2014-01-19: 20 mg via ORAL
  Filled 2014-01-19 (×4): qty 1

## 2014-01-19 MED ORDER — LATANOPROST 0.005 % OP SOLN
1.0000 [drp] | Freq: Every day | OPHTHALMIC | Status: DC
  Administered 2014-01-19: 1 [drp] via OPHTHALMIC
  Filled 2014-01-19: qty 2.5

## 2014-01-19 MED ORDER — ALUM & MAG HYDROXIDE-SIMETH 200-200-20 MG/5ML PO SUSP: 30.0000 mL | Freq: Four times a day (QID) | ORAL | Status: DC | PRN

## 2014-01-19 MED ORDER — LORATADINE 10 MG PO TABS
10.0000 mg | ORAL_TABLET | Freq: Every day | ORAL | Status: DC
  Administered 2014-01-19: 10 mg via ORAL
  Filled 2014-01-19: qty 1

## 2014-01-19 MED ORDER — ONDANSETRON HCL 4 MG/2ML IJ SOLN: 4.0000 mg | Freq: Four times a day (QID) | INTRAMUSCULAR | Status: DC | PRN

## 2014-01-19 MED ORDER — FUROSEMIDE 10 MG/ML IJ SOLN
20.0000 mg | Freq: Once | INTRAMUSCULAR | Status: AC
  Administered 2014-01-19: 20 mg via INTRAVENOUS
  Filled 2014-01-19: qty 2

## 2014-01-19 MED ORDER — INSULIN ASPART 100 UNIT/ML ~~LOC~~ SOLN
0.0000 [IU] | Freq: Every day | SUBCUTANEOUS | Status: DC
  Administered 2014-01-19: 2 [IU] via SUBCUTANEOUS

## 2014-01-19 MED ORDER — IRBESARTAN 150 MG PO TABS
150.0000 mg | ORAL_TABLET | Freq: Every day | ORAL | Status: DC
  Filled 2014-01-19: qty 1

## 2014-01-19 MED ORDER — INSULIN GLARGINE 100 UNIT/ML ~~LOC~~ SOLN
20.0000 [IU] | Freq: Every day | SUBCUTANEOUS | Status: DC
  Administered 2014-01-19: 20 [IU] via SUBCUTANEOUS

## 2014-01-19 MED ORDER — SODIUM CHLORIDE 0.9 % IJ SOLN: 3.0000 mL | INTRAMUSCULAR | Status: DC | PRN

## 2014-01-19 MED ORDER — INSULIN ASPART 100 UNIT/ML ~~LOC~~ SOLN
0.0000 [IU] | Freq: Three times a day (TID) | SUBCUTANEOUS | Status: DC
  Administered 2014-01-19: 3 [IU] via SUBCUTANEOUS
  Administered 2014-01-19: 2 [IU] via SUBCUTANEOUS

## 2014-01-19 MED ORDER — RIVAROXABAN 15 MG PO TABS
15.0000 mg | ORAL_TABLET | Freq: Every day | ORAL | Status: DC
  Filled 2014-01-19 (×2): qty 1

## 2014-01-19 MED ORDER — INSULIN ASPART 100 UNIT/ML ~~LOC~~ SOLN
5.0000 [IU] | Freq: Every day | SUBCUTANEOUS | Status: DC
  Administered 2014-01-19: 5 [IU] via SUBCUTANEOUS

## 2014-01-19 MED ORDER — POTASSIUM CHLORIDE CRYS ER 20 MEQ PO TBCR: 20.0000 meq | EXTENDED_RELEASE_TABLET | Freq: Every day | ORAL | Status: DC

## 2014-01-19 MED ORDER — DOCUSATE SODIUM 100 MG PO CAPS
100.0000 mg | ORAL_CAPSULE | Freq: Two times a day (BID) | ORAL | Status: DC
  Administered 2014-01-19: 100 mg via ORAL
  Filled 2014-01-19 (×2): qty 1

## 2014-01-19 MED ORDER — INSULIN GLARGINE 100 UNIT/ML ~~LOC~~ SOLN
22.0000 [IU] | SUBCUTANEOUS | Status: DC
  Filled 2014-01-19: qty 0.22

## 2014-01-19 MED ORDER — MONTELUKAST SODIUM 10 MG PO TABS
10.0000 mg | ORAL_TABLET | Freq: Every day | ORAL | Status: DC
  Administered 2014-01-19: 10 mg via ORAL
  Filled 2014-01-19: qty 1

## 2014-01-19 MED ORDER — ACETAMINOPHEN 325 MG PO TABS
650.0000 mg | ORAL_TABLET | Freq: Four times a day (QID) | ORAL | Status: DC | PRN
  Administered 2014-01-19: 650 mg via ORAL
  Filled 2014-01-19: qty 2

## 2014-01-19 MED ORDER — SODIUM CHLORIDE 0.9 % IJ SOLN
3.0000 mL | Freq: Two times a day (BID) | INTRAMUSCULAR | Status: DC
  Administered 2014-01-19: 3 mL via INTRAVENOUS

## 2014-01-19 MED ORDER — METOPROLOL TARTRATE 25 MG PO TABS
25.0000 mg | ORAL_TABLET | Freq: Two times a day (BID) | ORAL | Status: DC
  Administered 2014-01-19 (×2): 25 mg via ORAL
  Filled 2014-01-19 (×3): qty 1

## 2014-01-19 MED ORDER — SIMVASTATIN 10 MG PO TABS
10.0000 mg | ORAL_TABLET | Freq: Every day | ORAL | Status: DC
  Filled 2014-01-19: qty 1

## 2014-01-19 MED ORDER — ONDANSETRON HCL 4 MG PO TABS: 4.0000 mg | ORAL_TABLET | Freq: Four times a day (QID) | ORAL | Status: DC | PRN

## 2014-01-19 MED ORDER — KETOTIFEN FUMARATE 0.025 % OP SOLN
1.0000 [drp] | Freq: Two times a day (BID) | OPHTHALMIC | Status: DC
  Administered 2014-01-19: 1 [drp] via OPHTHALMIC
  Filled 2014-01-19: qty 5

## 2014-01-19 MED ORDER — SODIUM CHLORIDE 0.9 % IV SOLN: 250.0000 mL | INTRAVENOUS | Status: DC | PRN

## 2014-01-19 MED ORDER — CALCIUM CARBONATE-VITAMIN D 500-200 MG-UNIT PO TABS
1.0000 | ORAL_TABLET | Freq: Every day | ORAL | Status: DC
  Administered 2014-01-19: 1 via ORAL
  Filled 2014-01-19: qty 1

## 2014-01-19 MED ORDER — POLYVINYL ALCOHOL 1.4 % OP SOLN
1.0000 [drp] | Freq: Every day | OPHTHALMIC | Status: DC
  Administered 2014-01-19: 1 [drp] via OPHTHALMIC
  Filled 2014-01-19: qty 15

## 2014-01-19 MED ORDER — GABAPENTIN 100 MG PO CAPS
200.0000 mg | ORAL_CAPSULE | Freq: Two times a day (BID) | ORAL | Status: DC
  Administered 2014-01-19 (×2): 200 mg via ORAL
  Filled 2014-01-19 (×3): qty 2

## 2014-01-19 MED ORDER — LEVOTHYROXINE SODIUM 50 MCG PO TABS
50.0000 ug | ORAL_TABLET | Freq: Every day | ORAL | Status: DC
  Administered 2014-01-19: 50 ug via ORAL
  Filled 2014-01-19 (×2): qty 1

## 2014-01-19 MED ORDER — INSULIN GLARGINE 100 UNIT/ML ~~LOC~~ SOLN
20.0000 [IU] | Freq: Every morning | SUBCUTANEOUS | Status: DC
  Filled 2014-01-19 (×2): qty 0.2

## 2014-01-19 NOTE — Discharge Summary (Signed)
Physician Discharge Summary    AMAIRANI SHUEY  MR#: [id number]  DOB:1932-05-17  Date of Admission: 01/18/2014 Date of Discharge: 01/19/2014  Attending Physician:Chala Gul  Patient's NLZ:[id number] Antony Haste, MD  Consults:  none   Discharge Diagnoses: Active Problems:   Hyperkalemia   Discharge Medications:   Medication List         acetaminophen 325 MG tablet  Commonly known as:  TYLENOL  Take 650 mg by mouth every 6 (six) hours as needed for mild pain, fever or headache.     amLODipine 10 MG tablet  Commonly known as:  NORVASC  Take 10 mg by mouth daily.     calcium-vitamin D 500-200 MG-UNIT per tablet  Commonly known as:  OSCAL WITH D  Take 1 tablet by mouth daily.     docusate sodium 100 MG capsule  Commonly known as:  COLACE  Take 100 mg by mouth 2 (two) times daily.     DULoxetine 30 MG capsule  Commonly known as:  CYMBALTA  Take 30-60 mg by mouth 2 (two) times daily. 30 mg at 9am & 60mg  at 6pm     fluticasone 50 MCG/ACT nasal spray  Commonly known as:  FLONASE  Place into both nostrils daily.     furosemide 20 MG tablet  Commonly known as:  LASIX  Take 20 mg by mouth 2 (two) times daily.     gabapentin 100 MG capsule  Commonly known as:  NEURONTIN  Take 200 mg by mouth 2 (two) times daily.     insulin glargine 100 UNIT/ML injection  Commonly known as:  LANTUS  Inject 20-22 Units into the skin 2 (two) times daily. 20 units at 8 am in the morning and 22 units at 5pm.     insulin lispro 100 UNIT/ML injection  Commonly known as:  HUMALOG  Inject 2-4 Units into the skin 3 (three) times daily before meals. <120=0  120-150=2 units  >151=4 units     insulin lispro 100 UNIT/ML injection  Commonly known as:  HUMALOG  Inject 5 Units into the skin daily with breakfast.     ketotifen 0.025 % ophthalmic solution  Commonly known as:  ZADITOR  Place 1 drop into both eyes 2 (two) times daily.     levothyroxine 50 MCG tablet  Commonly known as:  SYNTHROID,  LEVOTHROID  Take 50 mcg by mouth daily.     loratadine 10 MG tablet  Commonly known as:  CLARITIN  Take 10 mg by mouth daily.     metoprolol tartrate 25 MG tablet  Commonly known as:  LOPRESSOR  Take 25 mg by mouth 2 (two) times daily.     montelukast 10 MG tablet  Commonly known as:  SINGULAIR  Take 10 mg by mouth daily.     potassium chloride SA 20 MEQ tablet  Commonly known as:  K-DUR,KLOR-CON  Take 1 tablet (20 mEq total) by mouth daily.     pravastatin 20 MG tablet  Commonly known as:  PRAVACHOL  Take 20 mg by mouth at bedtime.     Rivaroxaban 15 MG Tabs tablet  Commonly known as:  XARELTO  Take 15 mg by mouth daily with supper.     SYSTANE PRESERVATIVE FREE OP  Place 1 drop into both eyes 4 (four) times daily.     SYSTANE 0.4-0.3 % Gel  Generic drug:  Polyethyl Glycol-Propyl Glycol  Place 1 application into both eyes at bedtime.     valsartan 160 MG tablet  Commonly known as:  DIOVAN  Take 160 mg by mouth daily.     Vitamin D-3 1000 UNITS Caps  Take 1,000 Units by mouth daily.     XALATAN 0.005 % ophthalmic solution  Generic drug:  latanoprost  Place 1 drop into both eyes at bedtime.        Hospital Procedures: Dg Chest 2 View  01/18/2014   CLINICAL DATA:  Chest pain; history of coronary artery disease and atrial fibrillation  EXAM: CHEST  2 VIEW  COMPARISON:  PA and lateral chest x-ray of March 01, 2010  FINDINGS: The lungs are mildly hypoinflated and exhibit mildly increased interstitial markings especially on the left. The cardiac silhouette is enlarged and the central pulmonary vascularity is mildly prominent. There is no pleural effusion. The bony thorax is unremarkable.  IMPRESSION: The findings suggest low-grade CHF with mild interstitial edema. There is no alveolar edema nor pleural effusion.   Electronically Signed   By: David  Martinique   On: 01/18/2014 22:14    History of Present Illness: Pt presented w/ atypical chest pain and hyperkalemia w/ mild  renal failure   Hospital Course: Atypical CP - Trop neg x 3, EKG w/o ischemic changes. CP has improved overnight. Unlikely ACS, more likely GERD or MSK pain. F/u further as outpatient at SNF . Cont APAP prn for pain control   Hyperkalemia - resolved w/ one dose of lasix 20mg  IV last night. Will cut her PO KDur 20 mEq BID to QD only from here on. Recheck BMet in 3 days.   HTN - remained normotensive. No changes   AKI - Cr returned to baseline this AM. Recheck BMet in 3 days   A fib - remained rate controlled. Cont xarelto as prescribed     Day of Discharge Exam BP 102/55  Pulse 89  Temp(Src) 98.2 F (36.8 C) (Oral)  Resp 18  Ht 5\' 4"  (1.626 m)  Wt 176 lb 5.9 oz (80 kg)  BMI 30.26 kg/m2  SpO2 98%  Physical Exam: General appearance:  Eyes: no scleral icterus Throat: oropharynx moist without erythema Resp: CTAB, no wheezes, rales  Cardio: ir/ir, reg rate , no MRG  GI: soft, non-tender; bowel sounds normal; no masses,  no organomegaly Extremities: no clubbing, cyanosis or edema  Discharge Labs:  Recent Labs  01/18/14 1927 01/19/14 0717  NA 134* 140  K 6.3* 4.6  CL 97 102  CO2 26 25  GLUCOSE 284* 188*  BUN 37* 28*  CREATININE 1.45* 1.12*  CALCIUM 10.1 9.5   No results found for this basename: AST, ALT, ALKPHOS, BILITOT, PROT, ALBUMIN,  in the last 72 hours  Recent Labs  01/18/14 1927 01/19/14 0717  WBC 12.1* 7.5  HGB 11.7* 11.5*  HCT 36.9 36.4  MCV 88.7 89.2  PLT 209 194   Lab Results  Component Value Date   INR 1.19 01/18/2014   INR 1.60* 07/11/2013   INR 1.43 03/15/2010    Recent Labs  01/19/14 0344 01/19/14 0717  TROPONINI <0.30 <0.30   No results found for this basename: TSH, T4TOTAL, FREET3, T3FREE, THYROIDAB,  in the last 72 hours No results found for this basename: VITAMINB12, FOLATE, FERRITIN, TIBC, IRON, RETICCTPCT,  in the last 72 hours  Discharge instructions:     Discharge Instructions   Diet - low sodium heart healthy    Complete  by:  As directed      Increase activity slowly    Complete by:  As directed  03-Skilled Nursing Facility   Disposition: to SNF today    Condition on Discharge: stable   Tests Needing Follow-up: check Bmet in 3 days   Time spent in discharge (includes decision making & examination of pt): 45 minutes    Signed: Velna Hatchet 01/19/2014, 9:59 AM

## 2014-01-19 NOTE — Progress Notes (Signed)
Patient is medically stable to go back to [facility] today. Per Peter Congo weekend admissions coordinator at [facility] patient has been in the hospital less then 24 hours and an FL2 is not needed. Peter Congo also reported that patient can return today. Clinical Education officer, museum (Hill) prepared D/C packet and faxed Peter Congo D/C summary. Hill arranged EMS for transport. Hill contacted patient's daughter in law [name] and made her aware of above. RN will call [name] when EMS arrives. Please reconsult if further social work needs arise. Hill signing off.   Joanna Hill, Joanna Hill [phone]

## 2014-01-19 NOTE — Progress Notes (Signed)
01/19/2014 12:57 PM Nursing note Copy of AVS form placed in patient's packet to return to [facility] today with medications already given and those due this evening. D/c iv line. D/c tele. D/c SNF via ambulance transport per family request.  Arville Lime Cressie Betzler

## 2014-01-19 NOTE — Progress Notes (Signed)
01/19/2014 1030 Left message with patient's son to call back regarding pt. Ready for d/c back to SNF. Social work also made aware of pt. Ready for d/c.  [place]

## 2014-08-26 ENCOUNTER — Other Ambulatory Visit: Payer: Self-pay | Admitting: Endocrinology

## 2014-08-26 DIAGNOSIS — Z853 Personal history of malignant neoplasm of breast: Secondary | ICD-10-CM

## 2014-08-26 DIAGNOSIS — Z1231 Encounter for screening mammogram for malignant neoplasm of breast: Secondary | ICD-10-CM

## 2014-10-10 ENCOUNTER — Ambulatory Visit
Admission: RE | Admit: 2014-10-10 | Discharge: 2014-10-10 | Disposition: A | Discharge status: Home / Self Care | Payer: Medicare Other | Source: Ambulatory Visit | Attending: Endocrinology | Admitting: Endocrinology

## 2014-10-10 DIAGNOSIS — Z1231 Encounter for screening mammogram for malignant neoplasm of breast: Secondary | ICD-10-CM

## 2014-10-10 DIAGNOSIS — Z853 Personal history of malignant neoplasm of breast: Secondary | ICD-10-CM

## 2014-11-19 ENCOUNTER — Other Ambulatory Visit (HOSPITAL_COMMUNITY): Payer: Medicare Other

## 2014-11-19 ENCOUNTER — Ambulatory Visit: Payer: Medicare Other | Admitting: Family

## 2014-11-25 ENCOUNTER — Encounter: Payer: Self-pay | Admitting: Family

## 2014-11-26 ENCOUNTER — Ambulatory Visit: Payer: Medicare Other | Admitting: Family

## 2014-11-26 ENCOUNTER — Other Ambulatory Visit (HOSPITAL_COMMUNITY): Payer: Medicare Other

## 2015-06-11 ENCOUNTER — Encounter (INDEPENDENT_AMBULATORY_CARE_PROVIDER_SITE_OTHER): Payer: Medicare Other | Admitting: Ophthalmology

## 2015-06-11 DIAGNOSIS — H35371 Puckering of macula, right eye: Secondary | ICD-10-CM | POA: Diagnosis not present

## 2015-06-11 DIAGNOSIS — H353112 Nonexudative age-related macular degeneration, right eye, intermediate dry stage: Secondary | ICD-10-CM | POA: Diagnosis not present

## 2015-06-11 DIAGNOSIS — E103292 Type 1 diabetes mellitus with mild nonproliferative diabetic retinopathy without macular edema, left eye: Secondary | ICD-10-CM | POA: Diagnosis not present

## 2015-06-11 DIAGNOSIS — I10 Essential (primary) hypertension: Secondary | ICD-10-CM | POA: Diagnosis not present

## 2015-06-11 DIAGNOSIS — H35033 Hypertensive retinopathy, bilateral: Secondary | ICD-10-CM | POA: Diagnosis not present

## 2015-06-11 DIAGNOSIS — E10311 Type 1 diabetes mellitus with unspecified diabetic retinopathy with macular edema: Secondary | ICD-10-CM | POA: Diagnosis not present

## 2015-06-11 DIAGNOSIS — H43813 Vitreous degeneration, bilateral: Secondary | ICD-10-CM | POA: Diagnosis not present

## 2015-06-11 DIAGNOSIS — E103211 Type 1 diabetes mellitus with mild nonproliferative diabetic retinopathy with macular edema, right eye: Secondary | ICD-10-CM

## 2015-06-17 ENCOUNTER — Encounter: Payer: Self-pay | Admitting: Gastroenterology

## 2015-06-17 ENCOUNTER — Ambulatory Visit (INDEPENDENT_AMBULATORY_CARE_PROVIDER_SITE_OTHER): Payer: Medicare Other | Admitting: Gastroenterology

## 2015-06-17 VITALS — BP 108/60 | HR 60 | Ht 64.0 in | Wt 175.0 lb

## 2015-06-17 DIAGNOSIS — R1013 Epigastric pain: Secondary | ICD-10-CM

## 2015-06-17 MED ORDER — OMEPRAZOLE 20 MG PO CPDR: 20.0000 mg | DELAYED_RELEASE_CAPSULE | Freq: Two times a day (BID) | ORAL | Status: DC

## 2015-06-17 NOTE — Patient Instructions (Signed)
Increase your prilosec to twice daily (20-30 min before BF and dinner meals). New script written for you. Please start taking citrucel (orange flavored) powder fiber supplement.  This may cause some bloating at first but that usually goes away. Begin with a small spoonful and work your way up to a large, heaping spoonful daily over a week. Please return to see Dr. Ardis Hughs in 6-8 weeks to report on your symptoms.

## 2015-06-17 NOTE — Progress Notes (Signed)
Review of pertinent gastrointestinal problems: 1. Acute odynophasia, dysphasia.Marland KitchenMarland KitchenEGD Dr. Ardis Hughs December 2009 showed whitish exudate in distal esophagus versus ulcer. Brushings showed no fungus. This was likely pill-induced esophagitis, ulceration from her Fosamax.   HPI: This is a  very pleasant 81 year old woman   who was referred to me by Reynold Bowen, MD  to evaluate  dyspepsia, belching, fecal soilage .    Chief complaint is dyspepsia, belching, fecal soilage  She is very weak, she gets around in a wheelchair. She not very clear on her medical history  Having trouble with belching, spitting up a lot of foam at times.  Has bloating.  Feels full early when eating.   This has been going for a long time, but getting worse.  She is weak, gets around in a wheelchair  No dysphagia.  Weight has been stable.   No real pyrosis, no acid taste in mouth.  Nasal congestion.  Takes tums, gas ex.   She believes she is taking prilosec, 20mg  pill every morning.  Having trouble with fecal soilage, no overt bleeding..   Review of systems: Pertinent positive and negative review of systems were noted in the above HPI section. Complete review of systems was performed and was otherwise normal.   Past Medical History  Diagnosis Date  . Carotid artery occlusion   . Diabetes mellitus   . Hypertension   . Hyperlipidemia   . Atrial fibrillation ([place])   . Blindness   . Cancer [facility])     breast  . Stroke ([place]) 2009 and  2011    difficulty with swallowing    Past Surgical History  Procedure Laterality Date  . Arch aortogram  03-15-2010  . Mastectomy, partial Right     Current Outpatient Prescriptions  Medication Sig Dispense Refill  . acetaminophen (TYLENOL) 325 MG tablet Take 650 mg by mouth every 6 (six) hours as needed for mild pain, fever or headache.     Marland Kitchen amLODipine (NORVASC) 10 MG tablet Take 10 mg by mouth daily.    . calcium-vitamin D (OSCAL WITH D) 500-200 MG-UNIT per tablet  Take 1 tablet by mouth daily.    . Cholecalciferol (VITAMIN D-3) 1000 UNITS CAPS Take 1,000 Units by mouth daily.    Marland Kitchen docusate sodium (COLACE) 100 MG capsule Take 100 mg by mouth 2 (two) times daily.    . DULoxetine (CYMBALTA) 30 MG capsule Take 30-60 mg by mouth 2 (two) times daily. 30 mg at 9am & 60mg  at 6pm    . fluticasone (FLONASE) 50 MCG/ACT nasal spray Place into both nostrils daily.    . furosemide (LASIX) 20 MG tablet Take 20 mg by mouth 2 (two) times daily.    Marland Kitchen gabapentin (NEURONTIN) 100 MG capsule Take 200 mg by mouth 2 (two) times daily.    . insulin glargine (LANTUS) 100 UNIT/ML injection Inject 20-22 Units into the skin 2 (two) times daily. 20 units at 8 am in the morning and 22 units at 5pm.    . insulin lispro (HUMALOG) 100 UNIT/ML injection Inject 2-4 Units into the skin 3 (three) times daily before meals. <120=0  120-150=2 units  >151=4 units    . insulin lispro (HUMALOG) 100 UNIT/ML injection Inject 5 Units into the skin daily with breakfast.    . ketotifen (ZADITOR) 0.025 % ophthalmic solution Place 1 drop into both eyes 2 (two) times daily.     Marland Kitchen latanoprost (XALATAN) 0.005 % ophthalmic solution Place 1 drop into both eyes at bedtime.     Marland Kitchen  levothyroxine (SYNTHROID, LEVOTHROID) 50 MCG tablet Take 50 mcg by mouth daily.    Marland Kitchen loratadine (CLARITIN) 10 MG tablet Take 10 mg by mouth daily.    . metoprolol tartrate (LOPRESSOR) 25 MG tablet Take 25 mg by mouth 2 (two) times daily.    . montelukast (SINGULAIR) 10 MG tablet Take 10 mg by mouth daily.     Vladimir Faster Glycol-Propyl Glycol (SYSTANE PRESERVATIVE FREE OP) Place 1 drop into both eyes 4 (four) times daily.    Vladimir Faster Glycol-Propyl Glycol (SYSTANE) 0.4-0.3 % GEL Place 1 application into both eyes at bedtime.    . potassium chloride SA (K-DUR,KLOR-CON) 20 MEQ tablet Take 1 tablet (20 mEq total) by mouth daily.    . pravastatin (PRAVACHOL) 20 MG tablet Take 20 mg by mouth at bedtime.    . Rivaroxaban (XARELTO) 15 MG TABS  tablet Take 15 mg by mouth daily with supper.    . valsartan (DIOVAN) 160 MG tablet Take 160 mg by mouth daily.     No current facility-administered medications for this visit.    Allergies as of 06/17/2015  . (No Known Allergies)    Family History  Problem Relation Age of Onset  . Cancer Mother   . Heart disease Father     Heart disease before age 77    Social History   Social History  . Marital Status: Widowed    Spouse Name: N/A  . Number of Children: 3  . Years of Education: 1st   Occupational History  . Not on file.   Social History Main Topics  . Smoking status: Never Smoker   . Smokeless tobacco: Never Used  . Alcohol Use: No  . Drug Use: No  . Sexual Activity: Not on file   Other Topics Concern  . Not on file   Social History Narrative   Patient is a resident at [facility], has 3 children   Patient is left handed   Educational level 1st grade   Caffeine consumption is 1-2 daily     Physical Exam: BP 108/60 mmHg  Pulse 60  Ht 5\' 4"  (1.626 m)  Wt 175 lb (79.379 kg)  BMI 30.02 kg/m2 Constitutional: Chronically ill-appearing, frail, obese. Gets around in a wheelchair Psychiatric: alert and oriented x3 Eyes: extraocular movements intact Mouth: oral pharynx moist, no lesions Neck: supple no lymphadenopathy Cardiovascular: heart regular rate and rhythm Lungs: clear to auscultation bilaterally Abdomen: soft, nontender, nondistended, no obvious ascites, no peritoneal signs, normal bowel sounds Extremities: no lower extremity edema bilaterally Skin: no lesions on visible extremities   Assessment and plan: 81 y.o. female with  belching, dyspepsia, fecal soilage  She is weak, frail, elderly, gets around wheelchair and I do not think she should have endoscopic testing unless absolutely necessary. Her belching may be GERD related and I'm going to have her double her proton pump inhibitor to twice daily. This is best taken 20-30 mins  before breakfast and dinner meals. She has some fecal soilage and I would like to try her on fiber supplements daily to see if that helps. She'll return to see me in 6-8 weeks and sooner if needed.   Owens Loffler, MD [facility] 06/17/2015, 1:33 PM  Cc: Reynold Bowen, MD

## 2015-07-15 ENCOUNTER — Encounter: Payer: Self-pay | Admitting: Gastroenterology

## 2015-07-15 ENCOUNTER — Ambulatory Visit (INDEPENDENT_AMBULATORY_CARE_PROVIDER_SITE_OTHER): Payer: Medicare Other | Admitting: Gastroenterology

## 2015-07-15 VITALS — BP 92/58 | Wt 175.0 lb

## 2015-07-15 DIAGNOSIS — R142 Eructation: Secondary | ICD-10-CM

## 2015-07-15 NOTE — Patient Instructions (Signed)
Please start ranitidine 150mg  pill, one pill at bedtime every night. Change your gas-ex so that you are taking one pill with every single meal. Please return to see Dr. Ardis Hughs in 2 months.

## 2015-07-15 NOTE — Progress Notes (Signed)
Review of pertinent gastrointestinal problems: 1. Acute odynophasia, dysphasia; EGD December 2009 showed whitish exudate in distal esophagus versus ulcer. Brushings showed no fungus. This was likely pill-induced esophagitis, ulceration from her Fosamax.    HPI: This is a  very pleasant 81 year old woman   who was referred to me by Reynold Bowen, MD  to evaluate  belching, dyspepsia .    Chief complaint is belching, dyspepsia  I last saw Joanna Hill about 7 years ago. That was for some time aphasia dysphagia. She is here today to discuss a new problem. She is accompanied by her daughter who works for [employer]   She is bothered by  A lot of belchcing.  Seems to be constant, daily. She admits to having a very dry mouth as well.  This has been going on for months.  No flatus.  Pyrosis at times.  No significant dietary changes.  Sometimes mild abd pains.  Overall stable weight.  Walks rarely.   Has a film in her mouth.  Foamy feeling in her mouth.  She takes omeprazole 20 mg twice daily  Takes gas ex pill in AM first thing in AM.  Review of systems: Pertinent positive and negative review of systems were noted in the above HPI section. Complete review of systems was performed and was otherwise normal.   Past Medical History  Diagnosis Date  . Carotid artery occlusion   . Diabetes mellitus   . Hypertension   . Hyperlipidemia   . Atrial fibrillation ([place])   . Blindness   . Cancer [facility])     breast  . Stroke ([place]) 2009 and  2011    difficulty with swallowing    Past Surgical History  Procedure Laterality Date  . Arch aortogram  03-15-2010  . Mastectomy, partial Right     Current Outpatient Prescriptions  Medication Sig Dispense Refill  . acetaminophen (TYLENOL) 325 MG tablet Take 650 mg by mouth every 6 (six) hours as needed for mild pain, fever or headache.     Marland Kitchen amLODipine (NORVASC) 10 MG tablet Take 10 mg by mouth daily.    . calcium carbonate (OS-CAL) 1250 (500 CA) MG chewable  tablet Chew 1 tablet by mouth daily.    . calcium carbonate (TUMS - DOSED IN MG ELEMENTAL CALCIUM) 500 MG chewable tablet Chew 1 tablet by mouth daily.    . calcium-vitamin D (OSCAL WITH D) 500-200 MG-UNIT per tablet Take 1 tablet by mouth daily.    . Cholecalciferol (VITAMIN D-3) 1000 UNITS CAPS Take 1,000 Units by mouth daily.    Marland Kitchen docusate sodium (COLACE) 100 MG capsule Take 100 mg by mouth 2 (two) times daily.    Marland Kitchen donepezil (ARICEPT) 10 MG tablet Take 10 mg by mouth at bedtime.    . DULoxetine (CYMBALTA) 30 MG capsule Take 30-60 mg by mouth 2 (two) times daily. 30 mg at 9am & 60mg  at 6pm    . fluticasone (FLONASE) 50 MCG/ACT nasal spray Place into both nostrils daily.    . furosemide (LASIX) 20 MG tablet Take 40 mg by mouth 2 (two) times daily.     Marland Kitchen gabapentin (NEURONTIN) 100 MG capsule Take 200 mg by mouth 2 (two) times daily.    Marland Kitchen guaiFENesin (ROBITUSSIN) 100 MG/5ML liquid Take 200 mg by mouth 3 (three) times daily as needed for cough.    . hydrocortisone (ANUSOL-HC) 2.5 % rectal cream Place 1 application rectally 2 (two) times daily.    . insulin glargine (LANTUS) 100 UNIT/ML  injection Inject 20-22 Units into the skin 2 (two) times daily. 20 units at 8 am in the morning and 22 units at 5pm.    . insulin lispro (HUMALOG) 100 UNIT/ML injection Inject 2-4 Units into the skin 3 (three) times daily before meals. <120=0  120-150=2 units  >151=4 units    . insulin lispro (HUMALOG) 100 UNIT/ML injection Inject 5 Units into the skin daily with breakfast.    . ketotifen (ZADITOR) 0.025 % ophthalmic solution Place 1 drop into both eyes 2 (two) times daily.     Marland Kitchen latanoprost (XALATAN) 0.005 % ophthalmic solution Place 1 drop into both eyes at bedtime.     Marland Kitchen levothyroxine (SYNTHROID, LEVOTHROID) 50 MCG tablet Take 50 mcg by mouth daily.    Marland Kitchen LORazepam (ATIVAN) 0.5 MG tablet Take 0.5 mg by mouth 2 (two) times daily.    . magic mouthwash SOLN Take 5 mLs by mouth 3 (three) times daily.    .  methylcellulose oral powder Take by mouth daily.    . metoprolol tartrate (LOPRESSOR) 25 MG tablet Take 25 mg by mouth 2 (two) times daily.    . montelukast (SINGULAIR) 10 MG tablet Take 10 mg by mouth daily.     . multivitamin-iron-minerals-folic acid (CENTRUM) chewable tablet Chew 1 tablet by mouth daily.    Marland Kitchen omeprazole (PRILOSEC) 20 MG capsule Take 1 capsule (20 mg total) by mouth 2 (two) times daily before a meal. 30 min prior to meals 60 capsule 3  . ondansetron (ZOFRAN) 4 MG tablet Take 4 mg by mouth every 8 (eight) hours as needed for nausea or vomiting.    Vladimir Faster Glycol-Propyl Glycol (SYSTANE PRESERVATIVE FREE OP) Place 1 drop into both eyes 4 (four) times daily.    . potassium chloride SA (K-DUR,KLOR-CON) 20 MEQ tablet Take 1 tablet (20 mEq total) by mouth daily.    . pravastatin (PRAVACHOL) 20 MG tablet Take 20 mg by mouth at bedtime.    . Rivaroxaban (XARELTO) 15 MG TABS tablet Take 15 mg by mouth daily with supper.    . traMADol (ULTRAM) 50 MG tablet Take by mouth 2 (two) times daily as needed.    . valsartan (DIOVAN) 160 MG tablet Take 160 mg by mouth daily.     No current facility-administered medications for this visit.    Allergies as of 07/15/2015  . (No Known Allergies)    Family History  Problem Relation Age of Onset  . Cancer Mother   . Heart disease Father     Heart disease before age 68    Social History   Social History  . Marital Status: Widowed    Spouse Name: N/A  . Number of Children: 3  . Years of Education: 1st   Occupational History  . Not on file.   Social History Main Topics  . Smoking status: Never Smoker   . Smokeless tobacco: Never Used  . Alcohol Use: No  . Drug Use: No  . Sexual Activity: Not on file   Other Topics Concern  . Not on file   Social History Narrative   Patient is a resident at [facility], has 3 children   Patient is left handed   Educational level 1st grade   Caffeine consumption is 1-2  daily     Physical Exam: BP 92/58 mmHg  Wt 175 lb (79.379 kg) Constitutional: Chronically ill-appearing, obese, sitting in a wheelchair Psychiatric: alert and oriented x3 Eyes: extraocular movements intact Mouth: oral pharynx  moist, no lesions Neck: supple no lymphadenopathy Cardiovascular: heart regular rate and rhythm Lungs: clear to auscultation bilaterally Abdomen: soft, nontender, nondistended, no obvious ascites, no peritoneal signs, normal bowel sounds Extremities: no lower extremity edema bilaterally Skin: no lesions on visible extremities   Assessment and plan: 81 y.o. female with  chronic belching, dry mouth  This might be related to GERD however she is on twice daily proton pump inhibitor take at the correct time and relation to meals. I recommended that she start taking ranitidine 150 mg pills at bedtime every night on it scheduled basis. This can help with overnight acid exposure which may cause some nausea, excessive swallowing, leading to belching. She takes a Gas-X pill once daily in the early morning. I think this is best to administer with all of her meals. She'll return to see me in 2 months and sooner if needed. She is frail, quite elderly and i think he should only undergo invasive testing such as upper endoscopy if absolute necessary   Owens Loffler, MD [facility] 07/15/2015, 11:39 AM  Cc: Reynold Bowen, MD

## 2015-08-19 DIAGNOSIS — R41841 Cognitive communication deficit: Secondary | ICD-10-CM | POA: Diagnosis not present

## 2015-08-19 DIAGNOSIS — I6990 Unspecified sequelae of unspecified cerebrovascular disease: Secondary | ICD-10-CM | POA: Diagnosis not present

## 2015-08-19 DIAGNOSIS — I69898 Other sequelae of other cerebrovascular disease: Secondary | ICD-10-CM | POA: Diagnosis not present

## 2015-08-20 DIAGNOSIS — R41841 Cognitive communication deficit: Secondary | ICD-10-CM | POA: Diagnosis not present

## 2015-08-20 DIAGNOSIS — I69898 Other sequelae of other cerebrovascular disease: Secondary | ICD-10-CM | POA: Diagnosis not present

## 2015-08-20 DIAGNOSIS — I6990 Unspecified sequelae of unspecified cerebrovascular disease: Secondary | ICD-10-CM | POA: Diagnosis not present

## 2015-08-21 DIAGNOSIS — M79671 Pain in right foot: Secondary | ICD-10-CM | POA: Diagnosis not present

## 2015-08-21 DIAGNOSIS — E1140 Type 2 diabetes mellitus with diabetic neuropathy, unspecified: Secondary | ICD-10-CM | POA: Diagnosis not present

## 2015-08-21 DIAGNOSIS — I2510 Atherosclerotic heart disease of native coronary artery without angina pectoris: Secondary | ICD-10-CM | POA: Diagnosis not present

## 2015-08-21 DIAGNOSIS — I6990 Unspecified sequelae of unspecified cerebrovascular disease: Secondary | ICD-10-CM | POA: Diagnosis not present

## 2015-08-21 DIAGNOSIS — R41841 Cognitive communication deficit: Secondary | ICD-10-CM | POA: Diagnosis not present

## 2015-08-21 DIAGNOSIS — I69898 Other sequelae of other cerebrovascular disease: Secondary | ICD-10-CM | POA: Diagnosis not present

## 2015-08-21 DIAGNOSIS — B351 Tinea unguium: Secondary | ICD-10-CM | POA: Diagnosis not present

## 2015-08-24 DIAGNOSIS — R41841 Cognitive communication deficit: Secondary | ICD-10-CM | POA: Diagnosis not present

## 2015-08-24 DIAGNOSIS — I69898 Other sequelae of other cerebrovascular disease: Secondary | ICD-10-CM | POA: Diagnosis not present

## 2015-08-24 DIAGNOSIS — I6990 Unspecified sequelae of unspecified cerebrovascular disease: Secondary | ICD-10-CM | POA: Diagnosis not present

## 2015-08-24 DIAGNOSIS — M25562 Pain in left knee: Secondary | ICD-10-CM | POA: Diagnosis not present

## 2015-08-25 DIAGNOSIS — R41841 Cognitive communication deficit: Secondary | ICD-10-CM | POA: Diagnosis not present

## 2015-08-25 DIAGNOSIS — I69898 Other sequelae of other cerebrovascular disease: Secondary | ICD-10-CM | POA: Diagnosis not present

## 2015-08-25 DIAGNOSIS — I6990 Unspecified sequelae of unspecified cerebrovascular disease: Secondary | ICD-10-CM | POA: Diagnosis not present

## 2015-08-26 DIAGNOSIS — I69898 Other sequelae of other cerebrovascular disease: Secondary | ICD-10-CM | POA: Diagnosis not present

## 2015-08-26 DIAGNOSIS — E039 Hypothyroidism, unspecified: Secondary | ICD-10-CM | POA: Diagnosis not present

## 2015-08-26 DIAGNOSIS — E119 Type 2 diabetes mellitus without complications: Secondary | ICD-10-CM | POA: Diagnosis not present

## 2015-08-26 DIAGNOSIS — E559 Vitamin D deficiency, unspecified: Secondary | ICD-10-CM | POA: Diagnosis not present

## 2015-08-26 DIAGNOSIS — M1712 Unilateral primary osteoarthritis, left knee: Secondary | ICD-10-CM | POA: Diagnosis not present

## 2015-08-26 DIAGNOSIS — R41841 Cognitive communication deficit: Secondary | ICD-10-CM | POA: Diagnosis not present

## 2015-08-26 DIAGNOSIS — I6990 Unspecified sequelae of unspecified cerebrovascular disease: Secondary | ICD-10-CM | POA: Diagnosis not present

## 2015-08-26 DIAGNOSIS — D649 Anemia, unspecified: Secondary | ICD-10-CM | POA: Diagnosis not present

## 2015-08-27 DIAGNOSIS — I69898 Other sequelae of other cerebrovascular disease: Secondary | ICD-10-CM | POA: Diagnosis not present

## 2015-08-27 DIAGNOSIS — I6990 Unspecified sequelae of unspecified cerebrovascular disease: Secondary | ICD-10-CM | POA: Diagnosis not present

## 2015-08-27 DIAGNOSIS — R41841 Cognitive communication deficit: Secondary | ICD-10-CM | POA: Diagnosis not present

## 2015-08-30 DIAGNOSIS — I69898 Other sequelae of other cerebrovascular disease: Secondary | ICD-10-CM | POA: Diagnosis not present

## 2015-08-30 DIAGNOSIS — R41841 Cognitive communication deficit: Secondary | ICD-10-CM | POA: Diagnosis not present

## 2015-08-30 DIAGNOSIS — I6990 Unspecified sequelae of unspecified cerebrovascular disease: Secondary | ICD-10-CM | POA: Diagnosis not present

## 2015-08-31 DIAGNOSIS — I69898 Other sequelae of other cerebrovascular disease: Secondary | ICD-10-CM | POA: Diagnosis not present

## 2015-08-31 DIAGNOSIS — I6990 Unspecified sequelae of unspecified cerebrovascular disease: Secondary | ICD-10-CM | POA: Diagnosis not present

## 2015-08-31 DIAGNOSIS — R41841 Cognitive communication deficit: Secondary | ICD-10-CM | POA: Diagnosis not present

## 2015-09-01 DIAGNOSIS — R41841 Cognitive communication deficit: Secondary | ICD-10-CM | POA: Diagnosis not present

## 2015-09-01 DIAGNOSIS — I6990 Unspecified sequelae of unspecified cerebrovascular disease: Secondary | ICD-10-CM | POA: Diagnosis not present

## 2015-09-01 DIAGNOSIS — I69898 Other sequelae of other cerebrovascular disease: Secondary | ICD-10-CM | POA: Diagnosis not present

## 2015-09-02 DIAGNOSIS — I69898 Other sequelae of other cerebrovascular disease: Secondary | ICD-10-CM | POA: Diagnosis not present

## 2015-09-02 DIAGNOSIS — I6990 Unspecified sequelae of unspecified cerebrovascular disease: Secondary | ICD-10-CM | POA: Diagnosis not present

## 2015-09-02 DIAGNOSIS — R41841 Cognitive communication deficit: Secondary | ICD-10-CM | POA: Diagnosis not present

## 2015-09-03 DIAGNOSIS — R41841 Cognitive communication deficit: Secondary | ICD-10-CM | POA: Diagnosis not present

## 2015-09-03 DIAGNOSIS — I69898 Other sequelae of other cerebrovascular disease: Secondary | ICD-10-CM | POA: Diagnosis not present

## 2015-09-03 DIAGNOSIS — I6990 Unspecified sequelae of unspecified cerebrovascular disease: Secondary | ICD-10-CM | POA: Diagnosis not present

## 2015-09-04 DIAGNOSIS — R41841 Cognitive communication deficit: Secondary | ICD-10-CM | POA: Diagnosis not present

## 2015-09-04 DIAGNOSIS — I6990 Unspecified sequelae of unspecified cerebrovascular disease: Secondary | ICD-10-CM | POA: Diagnosis not present

## 2015-09-04 DIAGNOSIS — I69898 Other sequelae of other cerebrovascular disease: Secondary | ICD-10-CM | POA: Diagnosis not present

## 2015-09-07 DIAGNOSIS — I69898 Other sequelae of other cerebrovascular disease: Secondary | ICD-10-CM | POA: Diagnosis not present

## 2015-09-07 DIAGNOSIS — I6990 Unspecified sequelae of unspecified cerebrovascular disease: Secondary | ICD-10-CM | POA: Diagnosis not present

## 2015-09-07 DIAGNOSIS — R41841 Cognitive communication deficit: Secondary | ICD-10-CM | POA: Diagnosis not present

## 2015-09-08 DIAGNOSIS — I69898 Other sequelae of other cerebrovascular disease: Secondary | ICD-10-CM | POA: Diagnosis not present

## 2015-09-08 DIAGNOSIS — I6990 Unspecified sequelae of unspecified cerebrovascular disease: Secondary | ICD-10-CM | POA: Diagnosis not present

## 2015-09-08 DIAGNOSIS — R41841 Cognitive communication deficit: Secondary | ICD-10-CM | POA: Diagnosis not present

## 2015-09-11 DIAGNOSIS — I10 Essential (primary) hypertension: Secondary | ICD-10-CM | POA: Diagnosis not present

## 2015-09-11 DIAGNOSIS — E039 Hypothyroidism, unspecified: Secondary | ICD-10-CM | POA: Diagnosis not present

## 2015-09-11 DIAGNOSIS — E784 Other hyperlipidemia: Secondary | ICD-10-CM | POA: Diagnosis not present

## 2015-09-11 DIAGNOSIS — I6350 Cerebral infarction due to unspecified occlusion or stenosis of unspecified cerebral artery: Secondary | ICD-10-CM | POA: Diagnosis not present

## 2015-09-11 DIAGNOSIS — I6529 Occlusion and stenosis of unspecified carotid artery: Secondary | ICD-10-CM | POA: Diagnosis not present

## 2015-09-11 DIAGNOSIS — E1121 Type 2 diabetes mellitus with diabetic nephropathy: Secondary | ICD-10-CM | POA: Diagnosis not present

## 2015-09-11 DIAGNOSIS — I4891 Unspecified atrial fibrillation: Secondary | ICD-10-CM | POA: Diagnosis not present

## 2015-09-11 DIAGNOSIS — F419 Anxiety disorder, unspecified: Secondary | ICD-10-CM | POA: Diagnosis not present

## 2015-09-11 DIAGNOSIS — N182 Chronic kidney disease, stage 2 (mild): Secondary | ICD-10-CM | POA: Diagnosis not present

## 2015-09-11 DIAGNOSIS — R609 Edema, unspecified: Secondary | ICD-10-CM | POA: Diagnosis not present

## 2015-09-11 DIAGNOSIS — I2510 Atherosclerotic heart disease of native coronary artery without angina pectoris: Secondary | ICD-10-CM | POA: Diagnosis not present

## 2015-09-11 DIAGNOSIS — D649 Anemia, unspecified: Secondary | ICD-10-CM | POA: Diagnosis not present

## 2015-09-14 DIAGNOSIS — E119 Type 2 diabetes mellitus without complications: Secondary | ICD-10-CM | POA: Diagnosis not present

## 2015-09-18 ENCOUNTER — Other Ambulatory Visit: Payer: Self-pay

## 2015-09-18 DIAGNOSIS — Z1231 Encounter for screening mammogram for malignant neoplasm of breast: Secondary | ICD-10-CM

## 2015-09-21 ENCOUNTER — Ambulatory Visit: Payer: Medicare Other | Admitting: Gastroenterology

## 2015-09-21 ENCOUNTER — Ambulatory Visit (INDEPENDENT_AMBULATORY_CARE_PROVIDER_SITE_OTHER): Payer: Medicare Other | Admitting: Gastroenterology

## 2015-09-21 ENCOUNTER — Encounter: Payer: Self-pay | Admitting: Gastroenterology

## 2015-09-21 VITALS — BP 124/68 | HR 92 | Ht 64.0 in | Wt 169.0 lb

## 2015-09-21 DIAGNOSIS — R1013 Epigastric pain: Secondary | ICD-10-CM

## 2015-09-21 NOTE — Progress Notes (Signed)
Review of pertinent gastrointestinal problems: 1. Acute odynophasia, dysphasia; EGD December 2009 showed whitish exudate in distal esophagus versus ulcer. Brushings showed no fungus. This was likely pill-induced esophagitis, ulceration from her Fosamax.   HPI: This is an 82 year old woman whom I last saw 1 or 2 months ago   Chief complaint is "everything is wrong"  'everything is bothering her'  She wants an MRI of her whole body.  She feels she needs to get out of her cloths and take a bath all the time  Concerned about places on her skin, hands, feet that feel like acid.  Something is "not right"  She had some fluid drained out of her knee and it still feels poorly.  Both knees.  Her urine is bright yellow, it's like acid on her   I tried to direct her towards gastrointestinal symptoms and she does still seem to have some belching at times. She has typical GERD issues.     Past Medical History  Diagnosis Date  . Carotid artery occlusion   . Diabetes mellitus   . Hypertension   . Hyperlipidemia   . Atrial fibrillation ([place])   . Blindness   . Cancer [facility])     breast  . Stroke ([place]) 2009 and  2011    difficulty with swallowing    Past Surgical History  Procedure Laterality Date  . Arch aortogram  03-15-2010  . Mastectomy, partial Right     Current Outpatient Prescriptions  Medication Sig Dispense Refill  . acetaminophen (TYLENOL) 325 MG tablet Take 650 mg by mouth every 6 (six) hours as needed for mild pain, fever or headache.     Marland Kitchen amLODipine (NORVASC) 10 MG tablet Take 10 mg by mouth daily.    . calcium carbonate (OS-CAL) 1250 (500 CA) MG chewable tablet Chew 1 tablet by mouth daily.    . calcium carbonate (TUMS - DOSED IN MG ELEMENTAL CALCIUM) 500 MG chewable tablet Chew 1 tablet by mouth daily.    . calcium-vitamin D (OSCAL WITH D) 500-200 MG-UNIT per tablet Take 1 tablet by mouth daily.    . cetirizine (ZYRTEC) 10 MG tablet Take 10 mg by mouth at bedtime.     . Cholecalciferol (VITAMIN D-3) 1000 UNITS CAPS Take 1,000 Units by mouth daily.    Marland Kitchen docusate sodium (COLACE) 100 MG capsule Take 100 mg by mouth 2 (two) times daily.    Marland Kitchen donepezil (ARICEPT) 10 MG tablet Take 10 mg by mouth at bedtime.    . DULoxetine (CYMBALTA) 30 MG capsule Take 30-60 mg by mouth 2 (two) times daily. 30 mg at 9am & 60mg  at 6pm    . fluticasone (FLONASE) 50 MCG/ACT nasal spray Place into both nostrils daily.    . furosemide (LASIX) 20 MG tablet Take 40 mg by mouth 2 (two) times daily.     Marland Kitchen gabapentin (NEURONTIN) 100 MG capsule Take 200 mg by mouth 2 (two) times daily.    Marland Kitchen guaiFENesin (ROBITUSSIN) 100 MG/5ML liquid Take 200 mg by mouth 3 (three) times daily as needed for cough.    . hydrocortisone (ANUSOL-HC) 2.5 % rectal cream Place 1 application rectally 2 (two) times daily.    . insulin glargine (LANTUS) 100 UNIT/ML injection Inject 20-22 Units into the skin 2 (two) times daily. 20 units at 8 am in the morning and 22 units at 5pm.    . insulin lispro (HUMALOG) 100 UNIT/ML injection Inject 2-4 Units into the skin 3 (three) times daily before meals. <  120=0  120-150=2 units  >151=4 units    . insulin lispro (HUMALOG) 100 UNIT/ML injection Inject 5 Units into the skin daily with breakfast.    . ketotifen (ZADITOR) 0.025 % ophthalmic solution Place 1 drop into both eyes 2 (two) times daily.     Marland Kitchen latanoprost (XALATAN) 0.005 % ophthalmic solution Place 1 drop into both eyes at bedtime.     Marland Kitchen levothyroxine (SYNTHROID, LEVOTHROID) 50 MCG tablet Take 50 mcg by mouth daily.    Marland Kitchen LORazepam (ATIVAN) 0.5 MG tablet Take 0.5 mg by mouth 2 (two) times daily.    . magic mouthwash SOLN Take 5 mLs by mouth 3 (three) times daily.    . methylcellulose oral powder Take by mouth daily.    . metoprolol tartrate (LOPRESSOR) 25 MG tablet Take 25 mg by mouth 2 (two) times daily.    . montelukast (SINGULAIR) 10 MG tablet Take 10 mg by mouth daily.     . multivitamin-iron-minerals-folic acid  (CENTRUM) chewable tablet Chew 1 tablet by mouth daily.    Marland Kitchen omeprazole (PRILOSEC) 20 MG capsule Take 1 capsule (20 mg total) by mouth 2 (two) times daily before a meal. 30 min prior to meals 60 capsule 3  . ondansetron (ZOFRAN) 4 MG tablet Take 4 mg by mouth every 8 (eight) hours as needed for nausea or vomiting.    Vladimir Faster Glycol-Propyl Glycol (SYSTANE PRESERVATIVE FREE OP) Place 1 drop into both eyes 4 (four) times daily.    . potassium chloride SA (K-DUR,KLOR-CON) 20 MEQ tablet Take 1 tablet (20 mEq total) by mouth daily.    . pravastatin (PRAVACHOL) 20 MG tablet Take 20 mg by mouth at bedtime.    . ranitidine (ZANTAC) 150 MG tablet Take 150 mg by mouth 2 (two) times daily.    . Rivaroxaban (XARELTO) 15 MG TABS tablet Take 15 mg by mouth daily with supper.    . simethicone (GAS-X) 80 MG chewable tablet Chew 80 mg by mouth every 6 (six) hours as needed for flatulence. With each meal    . traMADol (ULTRAM) 50 MG tablet Take by mouth 2 (two) times daily as needed.    . valsartan (DIOVAN) 160 MG tablet Take 160 mg by mouth daily.     No current facility-administered medications for this visit.    Allergies as of 09/21/2015  . (No Known Allergies)    Family History  Problem Relation Age of Onset  . Cancer Mother   . Heart disease Father     Heart disease before age 52    Social History   Social History  . Marital Status: Widowed    Spouse Name: N/A  . Number of Children: 3  . Years of Education: 1st   Occupational History  . Not on file.   Social History Main Topics  . Smoking status: Never Smoker   . Smokeless tobacco: Never Used  . Alcohol Use: No  . Drug Use: No  . Sexual Activity: Not on file   Other Topics Concern  . Not on file   Social History Narrative   Patient is a resident at [facility], has 3 children   Patient is left handed   Educational level 1st grade   Caffeine consumption is 1-2 daily     Physical Exam: BP 124/68 mmHg   Pulse 92  Ht 5\' 4"  (1.626 m)  Wt 169 lb (76.658 kg)  BMI 28.99 kg/m2 Constitutional: Chronically ill-appearing, sits in a wheelchair Psychiatric: alert  and oriented x3 Abdomen: soft, nontender, nondistended, no obvious ascites, no peritoneal signs, normal bowel sounds   Assessment and plan: 82 y.o. female with GERD, dyspepsia, no alarm symptoms  She is really all over the place today in terms of symptoms telling me that she wants an MRI of her whole body, "everything is wrong". She has acid sensation on her skin and she points to several areas on her arms and abdomen that appear normal to me. She wants a referral to a doctor who can help her. It does not seem that she has any real specific, concerning GI issues right now. She does have some GERD and dyspepsia but she has no alarm symptoms of dysphagia or vomiting blood and in her general state of health I would not recommend any invasive testing.  I think she should continue her proton pump inhibitor twice daily and H2 blocker at night.   Owens Loffler, MD [facility] 09/21/2015, 3:50 PM

## 2015-09-21 NOTE — Patient Instructions (Signed)
You do not have a gastrointestinal problem.  Please see your primary care doctor about your urine color, acid feelings on your skin, belching.

## 2015-10-08 DIAGNOSIS — N390 Urinary tract infection, site not specified: Secondary | ICD-10-CM | POA: Diagnosis not present

## 2015-10-15 ENCOUNTER — Ambulatory Visit
Admission: RE | Admit: 2015-10-15 | Discharge: 2015-10-15 | Disposition: A | Discharge status: Home / Self Care | Payer: Medicare Other | Source: Ambulatory Visit

## 2015-10-15 DIAGNOSIS — Z1231 Encounter for screening mammogram for malignant neoplasm of breast: Secondary | ICD-10-CM | POA: Diagnosis not present

## 2015-10-20 ENCOUNTER — Other Ambulatory Visit: Payer: Self-pay | Admitting: Endocrinology

## 2015-10-20 DIAGNOSIS — R928 Other abnormal and inconclusive findings on diagnostic imaging of breast: Secondary | ICD-10-CM

## 2015-10-26 DIAGNOSIS — E1140 Type 2 diabetes mellitus with diabetic neuropathy, unspecified: Secondary | ICD-10-CM | POA: Diagnosis not present

## 2015-10-26 DIAGNOSIS — I2510 Atherosclerotic heart disease of native coronary artery without angina pectoris: Secondary | ICD-10-CM | POA: Diagnosis not present

## 2015-10-26 DIAGNOSIS — E784 Other hyperlipidemia: Secondary | ICD-10-CM | POA: Diagnosis not present

## 2015-10-26 DIAGNOSIS — I10 Essential (primary) hypertension: Secondary | ICD-10-CM | POA: Diagnosis not present

## 2015-10-26 DIAGNOSIS — E039 Hypothyroidism, unspecified: Secondary | ICD-10-CM | POA: Diagnosis not present

## 2015-10-26 DIAGNOSIS — F419 Anxiety disorder, unspecified: Secondary | ICD-10-CM | POA: Diagnosis not present

## 2015-10-26 DIAGNOSIS — D649 Anemia, unspecified: Secondary | ICD-10-CM | POA: Diagnosis not present

## 2015-10-26 DIAGNOSIS — I6529 Occlusion and stenosis of unspecified carotid artery: Secondary | ICD-10-CM | POA: Diagnosis not present

## 2015-10-26 DIAGNOSIS — N182 Chronic kidney disease, stage 2 (mild): Secondary | ICD-10-CM | POA: Diagnosis not present

## 2015-10-26 DIAGNOSIS — I6350 Cerebral infarction due to unspecified occlusion or stenosis of unspecified cerebral artery: Secondary | ICD-10-CM | POA: Diagnosis not present

## 2015-10-26 DIAGNOSIS — F329 Major depressive disorder, single episode, unspecified: Secondary | ICD-10-CM | POA: Diagnosis not present

## 2015-10-26 DIAGNOSIS — I4891 Unspecified atrial fibrillation: Secondary | ICD-10-CM | POA: Diagnosis not present

## 2015-10-27 DIAGNOSIS — E039 Hypothyroidism, unspecified: Secondary | ICD-10-CM | POA: Diagnosis not present

## 2015-10-29 ENCOUNTER — Telehealth: Payer: Self-pay | Admitting: Gastroenterology

## 2015-10-29 DIAGNOSIS — R0600 Dyspnea, unspecified: Secondary | ICD-10-CM | POA: Diagnosis not present

## 2015-10-29 DIAGNOSIS — E039 Hypothyroidism, unspecified: Secondary | ICD-10-CM | POA: Diagnosis not present

## 2015-10-29 DIAGNOSIS — K219 Gastro-esophageal reflux disease without esophagitis: Secondary | ICD-10-CM | POA: Diagnosis not present

## 2015-10-29 DIAGNOSIS — I6990 Unspecified sequelae of unspecified cerebrovascular disease: Secondary | ICD-10-CM | POA: Diagnosis not present

## 2015-10-29 DIAGNOSIS — E119 Type 2 diabetes mellitus without complications: Secondary | ICD-10-CM | POA: Diagnosis not present

## 2015-10-29 DIAGNOSIS — E559 Vitamin D deficiency, unspecified: Secondary | ICD-10-CM | POA: Diagnosis not present

## 2015-10-29 DIAGNOSIS — I69898 Other sequelae of other cerebrovascular disease: Secondary | ICD-10-CM | POA: Diagnosis not present

## 2015-10-29 DIAGNOSIS — R079 Chest pain, unspecified: Secondary | ICD-10-CM | POA: Diagnosis not present

## 2015-10-29 DIAGNOSIS — R41841 Cognitive communication deficit: Secondary | ICD-10-CM | POA: Diagnosis not present

## 2015-10-29 DIAGNOSIS — R1314 Dysphagia, pharyngoesophageal phase: Secondary | ICD-10-CM | POA: Diagnosis not present

## 2015-10-29 DIAGNOSIS — D649 Anemia, unspecified: Secondary | ICD-10-CM | POA: Diagnosis not present

## 2015-10-29 DIAGNOSIS — I10 Essential (primary) hypertension: Secondary | ICD-10-CM | POA: Diagnosis not present

## 2015-10-29 NOTE — Telephone Encounter (Signed)
Caller name: Jeani Hawking Relation to pt: [facility]  Call back number: [phone]   Reason for call:  Jeani Hawking is wanting to get patient in to see Dr. Ardis Hughs asap.  States she is having trouble swallowing.  Pt was just seen in February by Dr. Ardis Hughs

## 2015-10-30 DIAGNOSIS — I69898 Other sequelae of other cerebrovascular disease: Secondary | ICD-10-CM | POA: Diagnosis not present

## 2015-10-30 DIAGNOSIS — I6990 Unspecified sequelae of unspecified cerebrovascular disease: Secondary | ICD-10-CM | POA: Diagnosis not present

## 2015-10-30 DIAGNOSIS — R1314 Dysphagia, pharyngoesophageal phase: Secondary | ICD-10-CM | POA: Diagnosis not present

## 2015-10-30 DIAGNOSIS — R41841 Cognitive communication deficit: Secondary | ICD-10-CM | POA: Diagnosis not present

## 2015-10-30 NOTE — Telephone Encounter (Signed)
I spoke with Jeani Hawking and she states that the pt has decided not to come in, she does not want to be seen at this time.  They will call back if she decides she to be seen.

## 2015-11-01 DIAGNOSIS — R41841 Cognitive communication deficit: Secondary | ICD-10-CM | POA: Diagnosis not present

## 2015-11-01 DIAGNOSIS — I6990 Unspecified sequelae of unspecified cerebrovascular disease: Secondary | ICD-10-CM | POA: Diagnosis not present

## 2015-11-01 DIAGNOSIS — R1314 Dysphagia, pharyngoesophageal phase: Secondary | ICD-10-CM | POA: Diagnosis not present

## 2015-11-01 DIAGNOSIS — I69898 Other sequelae of other cerebrovascular disease: Secondary | ICD-10-CM | POA: Diagnosis not present

## 2015-11-02 DIAGNOSIS — R1314 Dysphagia, pharyngoesophageal phase: Secondary | ICD-10-CM | POA: Diagnosis not present

## 2015-11-02 DIAGNOSIS — R41841 Cognitive communication deficit: Secondary | ICD-10-CM | POA: Diagnosis not present

## 2015-11-02 DIAGNOSIS — I69898 Other sequelae of other cerebrovascular disease: Secondary | ICD-10-CM | POA: Diagnosis not present

## 2015-11-02 DIAGNOSIS — I6990 Unspecified sequelae of unspecified cerebrovascular disease: Secondary | ICD-10-CM | POA: Diagnosis not present

## 2015-11-04 DIAGNOSIS — I69898 Other sequelae of other cerebrovascular disease: Secondary | ICD-10-CM | POA: Diagnosis not present

## 2015-11-04 DIAGNOSIS — I6990 Unspecified sequelae of unspecified cerebrovascular disease: Secondary | ICD-10-CM | POA: Diagnosis not present

## 2015-11-04 DIAGNOSIS — R41841 Cognitive communication deficit: Secondary | ICD-10-CM | POA: Diagnosis not present

## 2015-11-04 DIAGNOSIS — R1314 Dysphagia, pharyngoesophageal phase: Secondary | ICD-10-CM | POA: Diagnosis not present

## 2015-11-05 DIAGNOSIS — R1314 Dysphagia, pharyngoesophageal phase: Secondary | ICD-10-CM | POA: Diagnosis not present

## 2015-11-05 DIAGNOSIS — R41841 Cognitive communication deficit: Secondary | ICD-10-CM | POA: Diagnosis not present

## 2015-11-05 DIAGNOSIS — I69898 Other sequelae of other cerebrovascular disease: Secondary | ICD-10-CM | POA: Diagnosis not present

## 2015-11-05 DIAGNOSIS — I6990 Unspecified sequelae of unspecified cerebrovascular disease: Secondary | ICD-10-CM | POA: Diagnosis not present

## 2015-11-06 DIAGNOSIS — R1314 Dysphagia, pharyngoesophageal phase: Secondary | ICD-10-CM | POA: Diagnosis not present

## 2015-11-06 DIAGNOSIS — I69898 Other sequelae of other cerebrovascular disease: Secondary | ICD-10-CM | POA: Diagnosis not present

## 2015-11-06 DIAGNOSIS — R41841 Cognitive communication deficit: Secondary | ICD-10-CM | POA: Diagnosis not present

## 2015-11-06 DIAGNOSIS — I6990 Unspecified sequelae of unspecified cerebrovascular disease: Secondary | ICD-10-CM | POA: Diagnosis not present

## 2015-11-09 DIAGNOSIS — I69898 Other sequelae of other cerebrovascular disease: Secondary | ICD-10-CM | POA: Diagnosis not present

## 2015-11-09 DIAGNOSIS — I6990 Unspecified sequelae of unspecified cerebrovascular disease: Secondary | ICD-10-CM | POA: Diagnosis not present

## 2015-11-09 DIAGNOSIS — R1314 Dysphagia, pharyngoesophageal phase: Secondary | ICD-10-CM | POA: Diagnosis not present

## 2015-11-09 DIAGNOSIS — R41841 Cognitive communication deficit: Secondary | ICD-10-CM | POA: Diagnosis not present

## 2015-11-10 DIAGNOSIS — R41841 Cognitive communication deficit: Secondary | ICD-10-CM | POA: Diagnosis not present

## 2015-11-10 DIAGNOSIS — R1314 Dysphagia, pharyngoesophageal phase: Secondary | ICD-10-CM | POA: Diagnosis not present

## 2015-11-10 DIAGNOSIS — I69898 Other sequelae of other cerebrovascular disease: Secondary | ICD-10-CM | POA: Diagnosis not present

## 2015-11-10 DIAGNOSIS — I6990 Unspecified sequelae of unspecified cerebrovascular disease: Secondary | ICD-10-CM | POA: Diagnosis not present

## 2015-11-11 ENCOUNTER — Ambulatory Visit
Admission: RE | Admit: 2015-11-11 | Discharge: 2015-11-11 | Disposition: A | Discharge status: Home / Self Care | Payer: Medicare Other | Source: Ambulatory Visit | Attending: Endocrinology | Admitting: Endocrinology

## 2015-11-11 DIAGNOSIS — R1314 Dysphagia, pharyngoesophageal phase: Secondary | ICD-10-CM | POA: Diagnosis not present

## 2015-11-11 DIAGNOSIS — I69898 Other sequelae of other cerebrovascular disease: Secondary | ICD-10-CM | POA: Diagnosis not present

## 2015-11-11 DIAGNOSIS — R928 Other abnormal and inconclusive findings on diagnostic imaging of breast: Secondary | ICD-10-CM | POA: Diagnosis not present

## 2015-11-11 DIAGNOSIS — I6990 Unspecified sequelae of unspecified cerebrovascular disease: Secondary | ICD-10-CM | POA: Diagnosis not present

## 2015-11-11 DIAGNOSIS — R41841 Cognitive communication deficit: Secondary | ICD-10-CM | POA: Diagnosis not present

## 2015-11-12 DIAGNOSIS — I69898 Other sequelae of other cerebrovascular disease: Secondary | ICD-10-CM | POA: Diagnosis not present

## 2015-11-12 DIAGNOSIS — R41841 Cognitive communication deficit: Secondary | ICD-10-CM | POA: Diagnosis not present

## 2015-11-12 DIAGNOSIS — I6990 Unspecified sequelae of unspecified cerebrovascular disease: Secondary | ICD-10-CM | POA: Diagnosis not present

## 2015-11-12 DIAGNOSIS — R1314 Dysphagia, pharyngoesophageal phase: Secondary | ICD-10-CM | POA: Diagnosis not present

## 2015-11-18 DIAGNOSIS — Z79899 Other long term (current) drug therapy: Secondary | ICD-10-CM | POA: Diagnosis not present

## 2015-11-18 DIAGNOSIS — E119 Type 2 diabetes mellitus without complications: Secondary | ICD-10-CM | POA: Diagnosis not present

## 2015-11-19 DIAGNOSIS — F22 Delusional disorders: Secondary | ICD-10-CM | POA: Diagnosis not present

## 2015-11-19 DIAGNOSIS — G3184 Mild cognitive impairment, so stated: Secondary | ICD-10-CM | POA: Diagnosis not present

## 2015-11-19 DIAGNOSIS — F419 Anxiety disorder, unspecified: Secondary | ICD-10-CM | POA: Diagnosis not present

## 2015-11-27 DIAGNOSIS — E1140 Type 2 diabetes mellitus with diabetic neuropathy, unspecified: Secondary | ICD-10-CM | POA: Diagnosis not present

## 2015-11-27 DIAGNOSIS — B351 Tinea unguium: Secondary | ICD-10-CM | POA: Diagnosis not present

## 2015-11-27 DIAGNOSIS — M79671 Pain in right foot: Secondary | ICD-10-CM | POA: Diagnosis not present

## 2015-11-27 DIAGNOSIS — I2510 Atherosclerotic heart disease of native coronary artery without angina pectoris: Secondary | ICD-10-CM | POA: Diagnosis not present

## 2015-12-09 DIAGNOSIS — E785 Hyperlipidemia, unspecified: Secondary | ICD-10-CM | POA: Diagnosis not present

## 2015-12-09 DIAGNOSIS — D649 Anemia, unspecified: Secondary | ICD-10-CM | POA: Diagnosis not present

## 2015-12-09 DIAGNOSIS — Z5181 Encounter for therapeutic drug level monitoring: Secondary | ICD-10-CM | POA: Diagnosis not present

## 2015-12-09 DIAGNOSIS — Z79899 Other long term (current) drug therapy: Secondary | ICD-10-CM | POA: Diagnosis not present

## 2015-12-22 DIAGNOSIS — F22 Delusional disorders: Secondary | ICD-10-CM | POA: Diagnosis not present

## 2015-12-22 DIAGNOSIS — F419 Anxiety disorder, unspecified: Secondary | ICD-10-CM | POA: Diagnosis not present

## 2015-12-22 DIAGNOSIS — G3184 Mild cognitive impairment, so stated: Secondary | ICD-10-CM | POA: Diagnosis not present

## 2015-12-24 DIAGNOSIS — H401123 Primary open-angle glaucoma, left eye, severe stage: Secondary | ICD-10-CM | POA: Diagnosis not present

## 2015-12-24 DIAGNOSIS — H35031 Hypertensive retinopathy, right eye: Secondary | ICD-10-CM | POA: Diagnosis not present

## 2015-12-24 DIAGNOSIS — H401114 Primary open-angle glaucoma, right eye, indeterminate stage: Secondary | ICD-10-CM | POA: Diagnosis not present

## 2015-12-24 DIAGNOSIS — H40043 Steroid responder, bilateral: Secondary | ICD-10-CM | POA: Diagnosis not present

## 2015-12-24 DIAGNOSIS — E119 Type 2 diabetes mellitus without complications: Secondary | ICD-10-CM | POA: Diagnosis not present

## 2016-01-02 DIAGNOSIS — M25511 Pain in right shoulder: Secondary | ICD-10-CM | POA: Diagnosis not present

## 2016-01-02 DIAGNOSIS — W19XXXA Unspecified fall, initial encounter: Secondary | ICD-10-CM | POA: Diagnosis not present

## 2016-01-07 DIAGNOSIS — M25559 Pain in unspecified hip: Secondary | ICD-10-CM | POA: Diagnosis not present

## 2016-01-07 DIAGNOSIS — M533 Sacrococcygeal disorders, not elsewhere classified: Secondary | ICD-10-CM | POA: Diagnosis not present

## 2016-01-08 DIAGNOSIS — E784 Other hyperlipidemia: Secondary | ICD-10-CM | POA: Diagnosis not present

## 2016-01-08 DIAGNOSIS — E039 Hypothyroidism, unspecified: Secondary | ICD-10-CM | POA: Diagnosis not present

## 2016-01-08 DIAGNOSIS — F22 Delusional disorders: Secondary | ICD-10-CM | POA: Diagnosis not present

## 2016-01-08 DIAGNOSIS — F419 Anxiety disorder, unspecified: Secondary | ICD-10-CM | POA: Diagnosis not present

## 2016-01-08 DIAGNOSIS — I2510 Atherosclerotic heart disease of native coronary artery without angina pectoris: Secondary | ICD-10-CM | POA: Diagnosis not present

## 2016-01-08 DIAGNOSIS — E1141 Type 2 diabetes mellitus with diabetic mononeuropathy: Secondary | ICD-10-CM | POA: Diagnosis not present

## 2016-01-08 DIAGNOSIS — N182 Chronic kidney disease, stage 2 (mild): Secondary | ICD-10-CM | POA: Diagnosis not present

## 2016-01-08 DIAGNOSIS — I4891 Unspecified atrial fibrillation: Secondary | ICD-10-CM | POA: Diagnosis not present

## 2016-01-08 DIAGNOSIS — D649 Anemia, unspecified: Secondary | ICD-10-CM | POA: Diagnosis not present

## 2016-01-08 DIAGNOSIS — I6350 Cerebral infarction due to unspecified occlusion or stenosis of unspecified cerebral artery: Secondary | ICD-10-CM | POA: Diagnosis not present

## 2016-01-08 DIAGNOSIS — G3184 Mild cognitive impairment, so stated: Secondary | ICD-10-CM | POA: Diagnosis not present

## 2016-01-08 DIAGNOSIS — I10 Essential (primary) hypertension: Secondary | ICD-10-CM | POA: Diagnosis not present

## 2016-01-14 DIAGNOSIS — M6281 Muscle weakness (generalized): Secondary | ICD-10-CM | POA: Diagnosis not present

## 2016-01-14 DIAGNOSIS — R296 Repeated falls: Secondary | ICD-10-CM | POA: Diagnosis not present

## 2016-01-15 DIAGNOSIS — M6281 Muscle weakness (generalized): Secondary | ICD-10-CM | POA: Diagnosis not present

## 2016-01-15 DIAGNOSIS — R296 Repeated falls: Secondary | ICD-10-CM | POA: Diagnosis not present

## 2016-01-16 DIAGNOSIS — R296 Repeated falls: Secondary | ICD-10-CM | POA: Diagnosis not present

## 2016-01-16 DIAGNOSIS — M6281 Muscle weakness (generalized): Secondary | ICD-10-CM | POA: Diagnosis not present

## 2016-01-19 DIAGNOSIS — M6281 Muscle weakness (generalized): Secondary | ICD-10-CM | POA: Diagnosis not present

## 2016-01-19 DIAGNOSIS — R296 Repeated falls: Secondary | ICD-10-CM | POA: Diagnosis not present

## 2016-01-20 DIAGNOSIS — R296 Repeated falls: Secondary | ICD-10-CM | POA: Diagnosis not present

## 2016-01-20 DIAGNOSIS — M6281 Muscle weakness (generalized): Secondary | ICD-10-CM | POA: Diagnosis not present

## 2016-01-21 DIAGNOSIS — M6281 Muscle weakness (generalized): Secondary | ICD-10-CM | POA: Diagnosis not present

## 2016-01-21 DIAGNOSIS — R296 Repeated falls: Secondary | ICD-10-CM | POA: Diagnosis not present

## 2016-01-22 DIAGNOSIS — D649 Anemia, unspecified: Secondary | ICD-10-CM | POA: Diagnosis not present

## 2016-01-22 DIAGNOSIS — R296 Repeated falls: Secondary | ICD-10-CM | POA: Diagnosis not present

## 2016-01-22 DIAGNOSIS — E119 Type 2 diabetes mellitus without complications: Secondary | ICD-10-CM | POA: Diagnosis not present

## 2016-01-22 DIAGNOSIS — E785 Hyperlipidemia, unspecified: Secondary | ICD-10-CM | POA: Diagnosis not present

## 2016-01-22 DIAGNOSIS — E559 Vitamin D deficiency, unspecified: Secondary | ICD-10-CM | POA: Diagnosis not present

## 2016-01-22 DIAGNOSIS — E039 Hypothyroidism, unspecified: Secondary | ICD-10-CM | POA: Diagnosis not present

## 2016-01-22 DIAGNOSIS — M6281 Muscle weakness (generalized): Secondary | ICD-10-CM | POA: Diagnosis not present

## 2016-01-22 DIAGNOSIS — R945 Abnormal results of liver function studies: Secondary | ICD-10-CM | POA: Diagnosis not present

## 2016-01-23 ENCOUNTER — Emergency Department (HOSPITAL_COMMUNITY): Payer: Medicare Other

## 2016-01-23 ENCOUNTER — Emergency Department (HOSPITAL_COMMUNITY)
Admission: EM | Admit: 2016-01-23 | Discharge: 2016-01-23 | Disposition: A | Discharge status: Home / Self Care | Payer: Medicare Other | Attending: Emergency Medicine | Admitting: Emergency Medicine

## 2016-01-23 ENCOUNTER — Encounter (HOSPITAL_COMMUNITY): Payer: Self-pay | Admitting: Emergency Medicine

## 2016-01-23 DIAGNOSIS — F0390 Unspecified dementia without behavioral disturbance: Secondary | ICD-10-CM | POA: Insufficient documentation

## 2016-01-23 DIAGNOSIS — Z8673 Personal history of transient ischemic attack (TIA), and cerebral infarction without residual deficits: Secondary | ICD-10-CM | POA: Insufficient documentation

## 2016-01-23 DIAGNOSIS — R531 Weakness: Secondary | ICD-10-CM | POA: Diagnosis not present

## 2016-01-23 DIAGNOSIS — I10 Essential (primary) hypertension: Secondary | ICD-10-CM | POA: Diagnosis not present

## 2016-01-23 DIAGNOSIS — R5383 Other fatigue: Secondary | ICD-10-CM | POA: Diagnosis not present

## 2016-01-23 DIAGNOSIS — Z794 Long term (current) use of insulin: Secondary | ICD-10-CM | POA: Insufficient documentation

## 2016-01-23 DIAGNOSIS — R591 Generalized enlarged lymph nodes: Secondary | ICD-10-CM | POA: Insufficient documentation

## 2016-01-23 DIAGNOSIS — R404 Transient alteration of awareness: Secondary | ICD-10-CM | POA: Diagnosis not present

## 2016-01-23 DIAGNOSIS — R590 Localized enlarged lymph nodes: Secondary | ICD-10-CM

## 2016-01-23 DIAGNOSIS — E119 Type 2 diabetes mellitus without complications: Secondary | ICD-10-CM | POA: Diagnosis not present

## 2016-01-23 DIAGNOSIS — R9082 White matter disease, unspecified: Secondary | ICD-10-CM | POA: Diagnosis not present

## 2016-01-23 DIAGNOSIS — R4182 Altered mental status, unspecified: Secondary | ICD-10-CM | POA: Diagnosis not present

## 2016-01-23 DIAGNOSIS — Z79899 Other long term (current) drug therapy: Secondary | ICD-10-CM | POA: Insufficient documentation

## 2016-01-23 DIAGNOSIS — Z853 Personal history of malignant neoplasm of breast: Secondary | ICD-10-CM | POA: Diagnosis not present

## 2016-01-23 DIAGNOSIS — R442 Other hallucinations: Secondary | ICD-10-CM | POA: Diagnosis not present

## 2016-01-23 HISTORY — DX: Unspecified dementia, unspecified severity, without behavioral disturbance, psychotic disturbance, mood disturbance, and anxiety: F03.90

## 2016-01-23 LAB — BASIC METABOLIC PANEL
Anion gap: 11 (ref 5–15)
BUN: 30 mg/dL — ABNORMAL HIGH (ref 6–20)
CHLORIDE: 100 mmol/L — AB (ref 101–111)
CO2: 27 mmol/L (ref 22–32)
CREATININE: 1.17 mg/dL — AB (ref 0.44–1.00)
Calcium: 8.8 mg/dL — ABNORMAL LOW (ref 8.9–10.3)
GFR calc non Af Amer: 43 mL/min — ABNORMAL LOW (ref >60)
GFR, EST AFRICAN AMERICAN: 49 mL/min — AB (ref >60)
GLUCOSE: 126 mg/dL — AB (ref 65–99)
Potassium: 3.4 mmol/L — ABNORMAL LOW (ref 3.5–5.1)
Sodium: 138 mmol/L (ref 135–145)

## 2016-01-23 LAB — CBC
HCT: 34.7 % — ABNORMAL LOW (ref 36.0–46.0)
Hemoglobin: 11.6 g/dL — ABNORMAL LOW (ref 12.0–15.0)
MCH: 29.1 pg (ref 26.0–34.0)
MCHC: 33.4 g/dL (ref 30.0–36.0)
MCV: 87 fL (ref 78.0–100.0)
PLATELETS: 209 10*3/uL (ref 150–400)
RBC: 3.99 MIL/uL (ref 3.87–5.11)
RDW: 14.2 % (ref 11.5–15.5)
WBC: 6.5 10*3/uL (ref 4.0–10.5)

## 2016-01-23 LAB — URINALYSIS, ROUTINE W REFLEX MICROSCOPIC
BILIRUBIN URINE: NEGATIVE
Glucose, UA: NEGATIVE mg/dL
Hgb urine dipstick: NEGATIVE
Ketones, ur: NEGATIVE mg/dL
Leukocytes, UA: NEGATIVE
NITRITE: NEGATIVE
PROTEIN: NEGATIVE mg/dL
SPECIFIC GRAVITY, URINE: 1.015 (ref 1.005–1.030)
pH: 5 (ref 5.0–8.0)

## 2016-01-23 LAB — TROPONIN I

## 2016-01-23 LAB — CBG MONITORING, ED: GLUCOSE-CAPILLARY: 127 mg/dL — AB (ref 65–99)

## 2016-01-23 MED ORDER — POTASSIUM CHLORIDE CRYS ER 20 MEQ PO TBCR: 40.0000 meq | EXTENDED_RELEASE_TABLET | Freq: Once | ORAL | Status: DC

## 2016-01-23 MED ORDER — IOPAMIDOL (ISOVUE-370) INJECTION 76%
80.0000 mL | Freq: Once | INTRAVENOUS | Status: AC | PRN
  Administered 2016-01-23: 80 mL via INTRAVENOUS

## 2016-01-23 MED ORDER — SODIUM CHLORIDE 0.9 % IV BOLUS (SEPSIS)
250.0000 mL | Freq: Once | INTRAVENOUS | Status: AC
  Administered 2016-01-23: 250 mL via INTRAVENOUS

## 2016-01-23 MED ORDER — SODIUM CHLORIDE 0.9 % IV SOLN
INTRAVENOUS | Status: DC
Start: 2016-01-23 — End: 2016-01-23
  Administered 2016-01-23: 18:00:00 via INTRAVENOUS

## 2016-01-23 MED ORDER — SODIUM CHLORIDE 0.9 % IV BOLUS (SEPSIS): 500.0000 mL | Freq: Once | INTRAVENOUS | Status: DC

## 2016-01-23 NOTE — ED Notes (Signed)
BIB EMS from [facility], staff their reports pt exhibiting increased fatigue, generalized weakness, low BG readings. Pt has hx of hallucinations, aphasia, and tremors. Pt arrives A+OX4, speaking in complete sentences.

## 2016-01-23 NOTE — ED Provider Notes (Signed)
CSN: [id number]     Arrival date & time 01/23/16  1407 History   First MD Initiated Contact with Patient 01/23/16 1425     Chief Complaint  Patient presents with  . Fatigue      The history is provided by the patient, the EMS personnel and the nursing home. The history is limited by the condition of the patient (Hx dementia).  Pt was seen at 1445. Per EMS, NH report and pt: NH states pt has been having increased fatigue and generalized weakness for an unknown period of time. NH also states pt's CBG readings have been "running low." Pt has significant hx of dementia and currently denies any complaints.   Past Medical History  Diagnosis Date  . Carotid artery occlusion   . Diabetes mellitus   . Hypertension   . Hyperlipidemia   . Atrial fibrillation ([place])   . Blindness   . Cancer [facility])     breast  . Stroke ([place]) 2009 and  2011    difficulty with swallowing  . Dementia    Past Surgical History  Procedure Laterality Date  . Arch aortogram  03-15-2010  . Mastectomy, partial Right    Family History  Problem Relation Age of Onset  . Cancer Mother   . Heart disease Father     Heart disease before age 75   Social History  Substance Use Topics  . Smoking status: Never Smoker   . Smokeless tobacco: Never Used  . Alcohol Use: No    Review of Systems  Unable to perform ROS: Dementia      Allergies  Review of patient's allergies indicates no known allergies.  Home Medications   Prior to Admission medications   Medication Sig Start Date End Date Taking? Authorizing Provider  acetaminophen (TYLENOL) 325 MG tablet Take 650 mg by mouth every 6 (six) hours as needed for mild pain, moderate pain, fever or headache.    Yes Historical Provider, MD  amLODipine (NORVASC) 10 MG tablet Take 10 mg by mouth daily.   Yes Historical Provider, MD  antiseptic oral rinse (BIOTENE) LIQD 5 mLs by Mouth Rinse route 2 (two) times daily.   Yes Historical Provider, MD  calcium carbonate (OS-CAL)  1250 (500 CA) MG chewable tablet Chew 1 tablet by mouth daily.   Yes Historical Provider, MD  cetirizine (ZYRTEC) 10 MG tablet Take 10 mg by mouth at bedtime.   Yes Historical Provider, MD  Cholecalciferol (VITAMIN D-3) 1000 UNITS CAPS Take 1,000 Units by mouth daily.   Yes Historical Provider, MD  divalproex (DEPAKOTE ER) 250 MG 24 hr tablet Take 250 mg by mouth 2 (two) times daily.  12/23/15  Yes Historical Provider, MD  docusate sodium (COLACE) 100 MG capsule Take 100 mg by mouth 2 (two) times daily.   Yes Historical Provider, MD  donepezil (ARICEPT) 10 MG tablet Take 10 mg by mouth at bedtime.   Yes Historical Provider, MD  Eyelid Cleansers (OCUSOFT LID SCRUB) PADS Apply 1 each topically 2 (two) times daily.   Yes Historical Provider, MD  fluticasone (FLONASE) 50 MCG/ACT nasal spray Place 1 spray into both nostrils 2 (two) times daily.    Yes Historical Provider, MD  furosemide (LASIX) 20 MG tablet Take 40 mg by mouth 2 (two) times daily.    Yes Historical Provider, MD  gabapentin (NEURONTIN) 300 MG capsule Take 300 mg by mouth 2 (two) times daily.   Yes Historical Provider, MD  guaiFENesin (ROBITUSSIN) 100 MG/5ML liquid Take 400  mg by mouth 4 (four) times daily as needed for cough.    Yes Historical Provider, MD  hydrocortisone (ANUSOL-HC) 2.5 % rectal cream Place 1 application rectally 3 (three) times daily as needed for hemorrhoids.    Yes Historical Provider, MD  hydrOXYzine (ATARAX/VISTARIL) 10 MG tablet Take 10 mg by mouth daily as needed for anxiety.  01/19/16  Yes Historical Provider, MD  insulin lispro (HUMALOG) 100 UNIT/ML injection Inject 0-4 Units into the skin 3 (three) times daily before meals. <120=0  120-150=2 units  >151=4 units   Yes Historical Provider, MD  insulin lispro (HUMALOG) 100 UNIT/ML injection Inject 7 Units into the skin daily with breakfast.    Yes Historical Provider, MD  ipratropium-albuterol (DUONEB) 0.5-2.5 (3) MG/3ML SOLN Take 3 mLs by nebulization daily as needed  (shortness of breath/wheezing).   Yes Historical Provider, MD  ketotifen (ZADITOR) 0.025 % ophthalmic solution Place 1 drop into both eyes 2 (two) times daily.    Yes Historical Provider, MD  LEVEMIR 100 UNIT/ML injection Inject 20-35 Units into the skin 2 (two) times daily. 35 units every morning and 20 units every evening. 12/07/15  Yes Historical Provider, MD  levothyroxine (SYNTHROID, LEVOTHROID) 75 MCG tablet Take 75 mcg by mouth daily before breakfast.  01/03/16  Yes Historical Provider, MD  metoprolol tartrate (LOPRESSOR) 25 MG tablet Take 25 mg by mouth 2 (two) times daily.   Yes Historical Provider, MD  montelukast (SINGULAIR) 10 MG tablet Take 10 mg by mouth daily.    Yes Historical Provider, MD  nystatin (MYCOSTATIN) 100000 UNIT/ML suspension Take 10 mLs by mouth 4 (four) times daily.  12/28/15  Yes Historical Provider, MD  omeprazole (PRILOSEC) 20 MG capsule Take 1 capsule (20 mg total) by mouth 2 (two) times daily before a meal. 30 min prior to meals 06/17/15  Yes Milus Banister, MD  ondansetron (ZOFRAN) 4 MG tablet Take 4 mg by mouth every 6 (six) hours as needed for nausea or vomiting.    Yes Historical Provider, MD  potassium chloride SA (K-DUR,KLOR-CON) 20 MEQ tablet Take 1 tablet (20 mEq total) by mouth daily. Patient taking differently: Take 20 mEq by mouth 2 (two) times daily.  01/19/14  Yes Velna Hatchet, MD  pravastatin (PRAVACHOL) 20 MG tablet Take 20 mg by mouth at bedtime.   Yes Historical Provider, MD  ranitidine (ZANTAC) 150 MG tablet Take 150 mg by mouth 2 (two) times daily.   Yes Historical Provider, MD  Rivaroxaban (XARELTO) 15 MG TABS tablet Take 15 mg by mouth daily with supper.   Yes Historical Provider, MD  sertraline (ZOLOFT) 100 MG tablet Take 100 mg by mouth daily. 12/23/15  Yes Historical Provider, MD  simethicone (MYLICON) 80 MG chewable tablet Chew 80 mg by mouth 3 (three) times daily.   Yes Historical Provider, MD  sucralfate (CARAFATE) 1 g tablet Take 1 g by mouth  4 (four) times daily.  01/20/16  Yes Historical Provider, MD  traMADol (ULTRAM) 50 MG tablet Take 50 mg by mouth every 12 (twelve) hours as needed for moderate pain.    Yes Historical Provider, MD  valsartan (DIOVAN) 160 MG tablet Take 160 mg by mouth daily.   Yes Historical Provider, MD    BP 117/83 mmHg  Pulse 79  Temp(Src) 97.8 F (36.6 C) (Oral)  Resp 16  SpO2 98%   15:20 Orthostatic Vital Signs HR  Orthostatic Lying  - BP- Lying: 114/59 mmHg ; Pulse- Lying: 70  Orthostatic Sitting - BP- Sitting:  123/71 mmHg ; Pulse- Sitting: 85  Orthostatic Standing at 0 minutes - BP- Standing at 0 minutes: 151/127 mmHg ; Pulse- Standing at 0 minutes: 127       Physical Exam  1450: Physical examination:  Nursing notes reviewed; Vital signs and O2 SAT reviewed;  Constitutional: Well developed, Well nourished, In no acute distress; Head:  Normocephalic, atraumatic; Eyes: EOMI, PERRL, No scleral icterus; ENMT: Mouth and pharynx normal, Mucous membranes dry; Neck: Supple, Full range of motion, No lymphadenopathy; Cardiovascular: Irregular rate and rhythm, No gallop; Respiratory: Breath sounds clear & equal bilaterally, No wheezes.  Speaking sentences, Normal respiratory effort/excursion; Chest: Nontender, Movement normal; Abdomen: Soft, Nontender, Nondistended, Normal bowel sounds; Genitourinary: No CVA tenderness; Extremities: Pulses normal, No tenderness, No edema, No calf edema or asymmetry.; Neuro: Awake, alert, confused re: events. Speech clear. No facial droop. Grips equal. Moves all extremities spontaneously on stretcher without apparent gross focal motor deficits.; Skin: Color normal, Warm, Dry.   ED Course  Procedures (including critical care time) Labs Review  Imaging Review  I have personally reviewed and evaluated these images and lab results as part of my medical decision-making.   EKG Interpretation   Date/Time:  Saturday January 23 2016 14:30:15 EDT Ventricular Rate:  83 PR Interval:     QRS Duration: 90 QT Interval:  433 QTC Calculation: 509 R Axis:   46 Text Interpretation:  Atrial fibrillation Low voltage, precordial leads  Prolonged QT interval Artifact When compared with ECG of 01/19/2014 No  significant change was found Confirmed by [facility]  MD, Nunzio Cory [phone]) on  01/23/2016 3:07:07 PM      MDM  MDM Reviewed: previous chart, nursing note and vitals Reviewed previous: labs and ECG Interpretation: labs, ECG, x-ray and CT scan     Results for orders placed or performed during the hospital encounter of [id number]  Basic metabolic panel  Result Value Ref Range   Sodium 138 135 - 145 mmol/L   Potassium 3.4 (L) 3.5 - 5.1 mmol/L   Chloride 100 (L) 101 - 111 mmol/L   CO2 27 22 - 32 mmol/L   Glucose, Bld 126 (H) 65 - 99 mg/dL   BUN 30 (H) 6 - 20 mg/dL   Creatinine, Ser 1.17 (H) 0.44 - 1.00 mg/dL   Calcium 8.8 (L) 8.9 - 10.3 mg/dL   GFR calc non Af Amer 43 (L) >60 mL/min   GFR calc Af Amer 49 (L) >60 mL/min   Anion gap 11 5 - 15  CBC  Result Value Ref Range   WBC 6.5 4.0 - 10.5 K/uL   RBC 3.99 3.87 - 5.11 MIL/uL   Hemoglobin 11.6 (L) 12.0 - 15.0 g/dL   HCT 34.7 (L) 36.0 - 46.0 %   MCV 87.0 78.0 - 100.0 fL   MCH 29.1 26.0 - 34.0 pg   MCHC 33.4 30.0 - 36.0 g/dL   RDW 14.2 11.5 - 15.5 %   Platelets 209 150 - 400 K/uL  Urinalysis, Routine w reflex microscopic  Result Value Ref Range   Color, Urine YELLOW YELLOW   APPearance CLEAR CLEAR   Specific Gravity, Urine 1.015 1.005 - 1.030   pH 5.0 5.0 - 8.0   Glucose, UA NEGATIVE NEGATIVE mg/dL   Hgb urine dipstick NEGATIVE NEGATIVE   Bilirubin Urine NEGATIVE NEGATIVE   Ketones, ur NEGATIVE NEGATIVE mg/dL   Protein, ur NEGATIVE NEGATIVE mg/dL   Nitrite NEGATIVE NEGATIVE   Leukocytes, UA NEGATIVE NEGATIVE  Troponin I  Result Value Ref Range  Troponin I <0.03 <0.031 ng/mL  CBG monitoring, ED  Result Value Ref Range   Glucose-Capillary 127 (H) 65 - 99 mg/dL    Dg Chest 2 View 01/23/2016  CLINICAL DATA:   Increased fatigue.  Generalized weakness. EXAM: CHEST  2 VIEW COMPARISON:  Previous examinations, the most recent dated 01/18/2014. FINDINGS: Stable enlargement of the cardiac silhouette and prominence of the pulmonary vasculature and interstitial markings. No pleural fluid. Interval possible 6 mm nodule in the right mid lung zone laterally. Interval central peribronchial thickening. Mild thoracic spine degenerative changes. IMPRESSION: 1. Possible new 6 mm right lung nodule. This could be further evaluated with a chest CT with contrast. 2. Stable mild cardiomegaly and mild pulmonary vascular congestion. 3. Stable mild chronic interstitial lung disease with interval mild to moderate bronchitic changes. Electronically Signed   By: Claudie Revering M.D.   On: 01/23/2016 16:13   Ct Head Wo Contrast 01/23/2016  CLINICAL DATA:  Increased fatigue. Generalized weakness. Hypotension. EXAM: CT HEAD WITHOUT CONTRAST TECHNIQUE: Contiguous axial images were obtained from the base of the skull through the vertex without intravenous contrast. COMPARISON:  07/23/2013. FINDINGS: Diffusely enlarged ventricles and subarachnoid spaces. Patchy white matter low density in both cerebral hemispheres. Stable left posterior watershed cerebral mid infarct. Old bilateral alignment and basal ganglia lacunar infarcts. No intracranial hemorrhage, mass lesion or CT evidence of acute infarction. Unremarkable bones and included paranasal sinuses. IMPRESSION: 1. No acute abnormality. 2. Stable atrophy, chronic small vessel white matter ischemic changes and old infarcts, as described above. Electronically Signed   By: Claudie Revering M.D.   On: 01/23/2016 17:15    Results for CANDITA, KOPP (MRN [id number]) as of 01/23/2016 16:54  Ref. Range 07/11/2013 12:50 07/23/2013 12:30 01/18/2014 19:27 01/19/2014 07:17 01/23/2016 14:40  BUN Latest Ref Range: 6-20 mg/dL 27 (H) 31 (H) 37 (H) 28 (H) 30 (H)  Creatinine Latest Ref Range: 0.44-1.00 mg/dL 0.99 1.20 (H) 1.45  (H) 1.12 (H) 1.17 (H)     1815:  Judicious IVF given for mild tachycardia when standing. VS otherwise stable. BUN/Cr and H/H per baseline. Glucose stable. No clear indication for admission at this time based on results above. CT chest to f/u CXR pending. Sign out to Dr. Roderic Palau.   Francine Graven, DO 01/23/16 [phone]

## 2016-01-23 NOTE — ED Notes (Signed)
[facility] here to transport pt back to the NH.  Call to [facility] was unsuccessful.

## 2016-01-23 NOTE — Discharge Instructions (Signed)
Follow up with your family md this week °

## 2016-01-23 NOTE — ED Notes (Signed)
[facility] was called for pt's transportation back to [facility] facility.

## 2016-01-25 DIAGNOSIS — E119 Type 2 diabetes mellitus without complications: Secondary | ICD-10-CM | POA: Diagnosis not present

## 2016-01-25 DIAGNOSIS — M6281 Muscle weakness (generalized): Secondary | ICD-10-CM | POA: Diagnosis not present

## 2016-01-25 DIAGNOSIS — F0390 Unspecified dementia without behavioral disturbance: Secondary | ICD-10-CM | POA: Diagnosis not present

## 2016-01-25 DIAGNOSIS — Z5181 Encounter for therapeutic drug level monitoring: Secondary | ICD-10-CM | POA: Diagnosis not present

## 2016-01-25 DIAGNOSIS — R296 Repeated falls: Secondary | ICD-10-CM | POA: Diagnosis not present

## 2016-01-25 DIAGNOSIS — L03818 Cellulitis of other sites: Secondary | ICD-10-CM | POA: Diagnosis not present

## 2016-01-25 LAB — URINE CULTURE: CULTURE: NO GROWTH

## 2016-01-26 DIAGNOSIS — R296 Repeated falls: Secondary | ICD-10-CM | POA: Diagnosis not present

## 2016-01-26 DIAGNOSIS — M6281 Muscle weakness (generalized): Secondary | ICD-10-CM | POA: Diagnosis not present

## 2016-01-27 DIAGNOSIS — M6281 Muscle weakness (generalized): Secondary | ICD-10-CM | POA: Diagnosis not present

## 2016-01-27 DIAGNOSIS — R296 Repeated falls: Secondary | ICD-10-CM | POA: Diagnosis not present

## 2016-01-28 DIAGNOSIS — M6281 Muscle weakness (generalized): Secondary | ICD-10-CM | POA: Diagnosis not present

## 2016-01-28 DIAGNOSIS — R296 Repeated falls: Secondary | ICD-10-CM | POA: Diagnosis not present

## 2016-01-29 DIAGNOSIS — M6281 Muscle weakness (generalized): Secondary | ICD-10-CM | POA: Diagnosis not present

## 2016-01-29 DIAGNOSIS — R296 Repeated falls: Secondary | ICD-10-CM | POA: Diagnosis not present

## 2016-01-30 DIAGNOSIS — M6281 Muscle weakness (generalized): Secondary | ICD-10-CM | POA: Diagnosis not present

## 2016-01-30 DIAGNOSIS — R296 Repeated falls: Secondary | ICD-10-CM | POA: Diagnosis not present

## 2016-02-02 DIAGNOSIS — R296 Repeated falls: Secondary | ICD-10-CM | POA: Diagnosis not present

## 2016-02-02 DIAGNOSIS — M6281 Muscle weakness (generalized): Secondary | ICD-10-CM | POA: Diagnosis not present

## 2016-02-03 DIAGNOSIS — F419 Anxiety disorder, unspecified: Secondary | ICD-10-CM | POA: Diagnosis not present

## 2016-02-03 DIAGNOSIS — F22 Delusional disorders: Secondary | ICD-10-CM | POA: Diagnosis not present

## 2016-02-03 DIAGNOSIS — G3184 Mild cognitive impairment, so stated: Secondary | ICD-10-CM | POA: Diagnosis not present

## 2016-02-03 DIAGNOSIS — R296 Repeated falls: Secondary | ICD-10-CM | POA: Diagnosis not present

## 2016-02-03 DIAGNOSIS — M6281 Muscle weakness (generalized): Secondary | ICD-10-CM | POA: Diagnosis not present

## 2016-02-04 DIAGNOSIS — R296 Repeated falls: Secondary | ICD-10-CM | POA: Diagnosis not present

## 2016-02-04 DIAGNOSIS — M6281 Muscle weakness (generalized): Secondary | ICD-10-CM | POA: Diagnosis not present

## 2016-02-05 DIAGNOSIS — R296 Repeated falls: Secondary | ICD-10-CM | POA: Diagnosis not present

## 2016-02-05 DIAGNOSIS — M6281 Muscle weakness (generalized): Secondary | ICD-10-CM | POA: Diagnosis not present

## 2016-02-08 DIAGNOSIS — R296 Repeated falls: Secondary | ICD-10-CM | POA: Diagnosis not present

## 2016-02-08 DIAGNOSIS — M6281 Muscle weakness (generalized): Secondary | ICD-10-CM | POA: Diagnosis not present

## 2016-02-09 DIAGNOSIS — R296 Repeated falls: Secondary | ICD-10-CM | POA: Diagnosis not present

## 2016-02-09 DIAGNOSIS — M6281 Muscle weakness (generalized): Secondary | ICD-10-CM | POA: Diagnosis not present

## 2016-02-10 DIAGNOSIS — M6281 Muscle weakness (generalized): Secondary | ICD-10-CM | POA: Diagnosis not present

## 2016-02-10 DIAGNOSIS — R296 Repeated falls: Secondary | ICD-10-CM | POA: Diagnosis not present

## 2016-02-11 DIAGNOSIS — M6281 Muscle weakness (generalized): Secondary | ICD-10-CM | POA: Diagnosis not present

## 2016-02-11 DIAGNOSIS — R296 Repeated falls: Secondary | ICD-10-CM | POA: Diagnosis not present

## 2016-02-12 DIAGNOSIS — M6281 Muscle weakness (generalized): Secondary | ICD-10-CM | POA: Diagnosis not present

## 2016-02-12 DIAGNOSIS — R296 Repeated falls: Secondary | ICD-10-CM | POA: Diagnosis not present

## 2016-02-18 DIAGNOSIS — L309 Dermatitis, unspecified: Secondary | ICD-10-CM | POA: Diagnosis not present

## 2016-02-19 DIAGNOSIS — E119 Type 2 diabetes mellitus without complications: Secondary | ICD-10-CM | POA: Diagnosis not present

## 2016-02-19 DIAGNOSIS — D649 Anemia, unspecified: Secondary | ICD-10-CM | POA: Diagnosis not present

## 2016-02-19 DIAGNOSIS — E559 Vitamin D deficiency, unspecified: Secondary | ICD-10-CM | POA: Diagnosis not present

## 2016-02-19 DIAGNOSIS — E785 Hyperlipidemia, unspecified: Secondary | ICD-10-CM | POA: Diagnosis not present

## 2016-02-19 DIAGNOSIS — I10 Essential (primary) hypertension: Secondary | ICD-10-CM | POA: Diagnosis not present

## 2016-02-19 DIAGNOSIS — E039 Hypothyroidism, unspecified: Secondary | ICD-10-CM | POA: Diagnosis not present

## 2016-03-11 DIAGNOSIS — D649 Anemia, unspecified: Secondary | ICD-10-CM | POA: Diagnosis not present

## 2016-03-14 DIAGNOSIS — E119 Type 2 diabetes mellitus without complications: Secondary | ICD-10-CM | POA: Diagnosis not present

## 2016-03-14 DIAGNOSIS — M79672 Pain in left foot: Secondary | ICD-10-CM | POA: Diagnosis not present

## 2016-03-14 DIAGNOSIS — B351 Tinea unguium: Secondary | ICD-10-CM | POA: Diagnosis not present

## 2016-03-14 DIAGNOSIS — L309 Dermatitis, unspecified: Secondary | ICD-10-CM | POA: Diagnosis not present

## 2016-03-14 DIAGNOSIS — M79671 Pain in right foot: Secondary | ICD-10-CM | POA: Diagnosis not present

## 2016-03-16 DIAGNOSIS — L259 Unspecified contact dermatitis, unspecified cause: Secondary | ICD-10-CM | POA: Diagnosis not present

## 2016-03-17 DIAGNOSIS — E784 Other hyperlipidemia: Secondary | ICD-10-CM | POA: Diagnosis not present

## 2016-03-17 DIAGNOSIS — F0390 Unspecified dementia without behavioral disturbance: Secondary | ICD-10-CM | POA: Diagnosis not present

## 2016-03-17 DIAGNOSIS — I4891 Unspecified atrial fibrillation: Secondary | ICD-10-CM | POA: Diagnosis not present

## 2016-03-17 DIAGNOSIS — I6350 Cerebral infarction due to unspecified occlusion or stenosis of unspecified cerebral artery: Secondary | ICD-10-CM | POA: Diagnosis not present

## 2016-03-17 DIAGNOSIS — L259 Unspecified contact dermatitis, unspecified cause: Secondary | ICD-10-CM | POA: Diagnosis not present

## 2016-03-17 DIAGNOSIS — D649 Anemia, unspecified: Secondary | ICD-10-CM | POA: Diagnosis not present

## 2016-03-17 DIAGNOSIS — Z794 Long term (current) use of insulin: Secondary | ICD-10-CM | POA: Diagnosis not present

## 2016-03-17 DIAGNOSIS — N182 Chronic kidney disease, stage 2 (mild): Secondary | ICD-10-CM | POA: Diagnosis not present

## 2016-03-17 DIAGNOSIS — I10 Essential (primary) hypertension: Secondary | ICD-10-CM | POA: Diagnosis not present

## 2016-03-17 DIAGNOSIS — F329 Major depressive disorder, single episode, unspecified: Secondary | ICD-10-CM | POA: Diagnosis not present

## 2016-03-17 DIAGNOSIS — E1149 Type 2 diabetes mellitus with other diabetic neurological complication: Secondary | ICD-10-CM | POA: Diagnosis not present

## 2016-03-17 DIAGNOSIS — E039 Hypothyroidism, unspecified: Secondary | ICD-10-CM | POA: Diagnosis not present

## 2016-03-17 DIAGNOSIS — I2510 Atherosclerotic heart disease of native coronary artery without angina pectoris: Secondary | ICD-10-CM | POA: Diagnosis not present

## 2016-04-05 DIAGNOSIS — M6281 Muscle weakness (generalized): Secondary | ICD-10-CM | POA: Diagnosis not present

## 2016-04-05 DIAGNOSIS — R296 Repeated falls: Secondary | ICD-10-CM | POA: Diagnosis not present

## 2016-04-05 DIAGNOSIS — R1311 Dysphagia, oral phase: Secondary | ICD-10-CM | POA: Diagnosis not present

## 2016-04-06 DIAGNOSIS — R1311 Dysphagia, oral phase: Secondary | ICD-10-CM | POA: Diagnosis not present

## 2016-04-06 DIAGNOSIS — M6281 Muscle weakness (generalized): Secondary | ICD-10-CM | POA: Diagnosis not present

## 2016-04-06 DIAGNOSIS — R296 Repeated falls: Secondary | ICD-10-CM | POA: Diagnosis not present

## 2016-04-07 DIAGNOSIS — R1311 Dysphagia, oral phase: Secondary | ICD-10-CM | POA: Diagnosis not present

## 2016-04-07 DIAGNOSIS — R296 Repeated falls: Secondary | ICD-10-CM | POA: Diagnosis not present

## 2016-04-07 DIAGNOSIS — M6281 Muscle weakness (generalized): Secondary | ICD-10-CM | POA: Diagnosis not present

## 2016-04-08 DIAGNOSIS — R1311 Dysphagia, oral phase: Secondary | ICD-10-CM | POA: Diagnosis not present

## 2016-04-08 DIAGNOSIS — M6281 Muscle weakness (generalized): Secondary | ICD-10-CM | POA: Diagnosis not present

## 2016-04-08 DIAGNOSIS — R296 Repeated falls: Secondary | ICD-10-CM | POA: Diagnosis not present

## 2016-04-11 DIAGNOSIS — R1311 Dysphagia, oral phase: Secondary | ICD-10-CM | POA: Diagnosis not present

## 2016-04-11 DIAGNOSIS — M6281 Muscle weakness (generalized): Secondary | ICD-10-CM | POA: Diagnosis not present

## 2016-04-11 DIAGNOSIS — R296 Repeated falls: Secondary | ICD-10-CM | POA: Diagnosis not present

## 2016-04-12 DIAGNOSIS — R1311 Dysphagia, oral phase: Secondary | ICD-10-CM | POA: Diagnosis not present

## 2016-04-12 DIAGNOSIS — R296 Repeated falls: Secondary | ICD-10-CM | POA: Diagnosis not present

## 2016-04-12 DIAGNOSIS — M6281 Muscle weakness (generalized): Secondary | ICD-10-CM | POA: Diagnosis not present

## 2016-04-13 DIAGNOSIS — R296 Repeated falls: Secondary | ICD-10-CM | POA: Diagnosis not present

## 2016-04-13 DIAGNOSIS — R1311 Dysphagia, oral phase: Secondary | ICD-10-CM | POA: Diagnosis not present

## 2016-04-13 DIAGNOSIS — M6281 Muscle weakness (generalized): Secondary | ICD-10-CM | POA: Diagnosis not present

## 2016-04-14 DIAGNOSIS — R296 Repeated falls: Secondary | ICD-10-CM | POA: Diagnosis not present

## 2016-04-14 DIAGNOSIS — R1311 Dysphagia, oral phase: Secondary | ICD-10-CM | POA: Diagnosis not present

## 2016-04-14 DIAGNOSIS — M6281 Muscle weakness (generalized): Secondary | ICD-10-CM | POA: Diagnosis not present

## 2016-04-15 DIAGNOSIS — I69898 Other sequelae of other cerebrovascular disease: Secondary | ICD-10-CM | POA: Diagnosis not present

## 2016-04-15 DIAGNOSIS — M6281 Muscle weakness (generalized): Secondary | ICD-10-CM | POA: Diagnosis not present

## 2016-04-15 DIAGNOSIS — R1311 Dysphagia, oral phase: Secondary | ICD-10-CM | POA: Diagnosis not present

## 2016-04-18 DIAGNOSIS — M6281 Muscle weakness (generalized): Secondary | ICD-10-CM | POA: Diagnosis not present

## 2016-04-18 DIAGNOSIS — R1311 Dysphagia, oral phase: Secondary | ICD-10-CM | POA: Diagnosis not present

## 2016-04-18 DIAGNOSIS — I69898 Other sequelae of other cerebrovascular disease: Secondary | ICD-10-CM | POA: Diagnosis not present

## 2016-04-20 DIAGNOSIS — R451 Restlessness and agitation: Secondary | ICD-10-CM | POA: Diagnosis not present

## 2016-04-20 DIAGNOSIS — J069 Acute upper respiratory infection, unspecified: Secondary | ICD-10-CM | POA: Diagnosis not present

## 2016-04-20 DIAGNOSIS — F0390 Unspecified dementia without behavioral disturbance: Secondary | ICD-10-CM | POA: Diagnosis not present

## 2016-04-20 DIAGNOSIS — I10 Essential (primary) hypertension: Secondary | ICD-10-CM | POA: Diagnosis not present

## 2016-04-25 DIAGNOSIS — H401123 Primary open-angle glaucoma, left eye, severe stage: Secondary | ICD-10-CM | POA: Diagnosis not present

## 2016-04-25 DIAGNOSIS — H401114 Primary open-angle glaucoma, right eye, indeterminate stage: Secondary | ICD-10-CM | POA: Diagnosis not present

## 2016-04-25 DIAGNOSIS — H47012 Ischemic optic neuropathy, left eye: Secondary | ICD-10-CM | POA: Diagnosis not present

## 2016-04-28 DIAGNOSIS — F0390 Unspecified dementia without behavioral disturbance: Secondary | ICD-10-CM | POA: Diagnosis not present

## 2016-04-28 DIAGNOSIS — F322 Major depressive disorder, single episode, severe without psychotic features: Secondary | ICD-10-CM | POA: Diagnosis not present

## 2016-04-28 DIAGNOSIS — E119 Type 2 diabetes mellitus without complications: Secondary | ICD-10-CM | POA: Diagnosis not present

## 2016-04-28 DIAGNOSIS — F419 Anxiety disorder, unspecified: Secondary | ICD-10-CM | POA: Diagnosis not present

## 2016-05-02 DIAGNOSIS — F322 Major depressive disorder, single episode, severe without psychotic features: Secondary | ICD-10-CM | POA: Diagnosis not present

## 2016-05-02 DIAGNOSIS — I4891 Unspecified atrial fibrillation: Secondary | ICD-10-CM | POA: Diagnosis not present

## 2016-05-02 DIAGNOSIS — L299 Pruritus, unspecified: Secondary | ICD-10-CM | POA: Diagnosis not present

## 2016-05-02 DIAGNOSIS — F419 Anxiety disorder, unspecified: Secondary | ICD-10-CM | POA: Diagnosis not present

## 2016-05-04 ENCOUNTER — Encounter (HOSPITAL_COMMUNITY): Payer: Self-pay | Admitting: Emergency Medicine

## 2016-05-04 ENCOUNTER — Emergency Department (HOSPITAL_COMMUNITY): Payer: Medicare Other

## 2016-05-04 ENCOUNTER — Emergency Department (HOSPITAL_COMMUNITY)
Admission: EM | Admit: 2016-05-04 | Discharge: 2016-05-05 | Disposition: A | Discharge status: Home / Self Care | Payer: Medicare Other | Attending: Emergency Medicine | Admitting: Emergency Medicine

## 2016-05-04 DIAGNOSIS — S0101XA Laceration without foreign body of scalp, initial encounter: Secondary | ICD-10-CM | POA: Insufficient documentation

## 2016-05-04 DIAGNOSIS — S0990XA Unspecified injury of head, initial encounter: Secondary | ICD-10-CM | POA: Diagnosis not present

## 2016-05-04 DIAGNOSIS — Z79899 Other long term (current) drug therapy: Secondary | ICD-10-CM | POA: Insufficient documentation

## 2016-05-04 DIAGNOSIS — W1830XA Fall on same level, unspecified, initial encounter: Secondary | ICD-10-CM | POA: Diagnosis not present

## 2016-05-04 DIAGNOSIS — Y939 Activity, unspecified: Secondary | ICD-10-CM | POA: Insufficient documentation

## 2016-05-04 DIAGNOSIS — W19XXXA Unspecified fall, initial encounter: Secondary | ICD-10-CM

## 2016-05-04 DIAGNOSIS — Z853 Personal history of malignant neoplasm of breast: Secondary | ICD-10-CM | POA: Diagnosis not present

## 2016-05-04 DIAGNOSIS — S199XXA Unspecified injury of neck, initial encounter: Secondary | ICD-10-CM | POA: Diagnosis not present

## 2016-05-04 DIAGNOSIS — Y9289 Other specified places as the place of occurrence of the external cause: Secondary | ICD-10-CM | POA: Insufficient documentation

## 2016-05-04 DIAGNOSIS — Z7901 Long term (current) use of anticoagulants: Secondary | ICD-10-CM | POA: Insufficient documentation

## 2016-05-04 DIAGNOSIS — Y999 Unspecified external cause status: Secondary | ICD-10-CM | POA: Insufficient documentation

## 2016-05-04 DIAGNOSIS — T148 Other injury of unspecified body region: Secondary | ICD-10-CM | POA: Diagnosis not present

## 2016-05-04 DIAGNOSIS — Z8673 Personal history of transient ischemic attack (TIA), and cerebral infarction without residual deficits: Secondary | ICD-10-CM | POA: Diagnosis not present

## 2016-05-04 DIAGNOSIS — E119 Type 2 diabetes mellitus without complications: Secondary | ICD-10-CM | POA: Diagnosis not present

## 2016-05-04 DIAGNOSIS — Z794 Long term (current) use of insulin: Secondary | ICD-10-CM | POA: Diagnosis not present

## 2016-05-04 DIAGNOSIS — I2510 Atherosclerotic heart disease of native coronary artery without angina pectoris: Secondary | ICD-10-CM | POA: Diagnosis not present

## 2016-05-04 DIAGNOSIS — M542 Cervicalgia: Secondary | ICD-10-CM | POA: Diagnosis not present

## 2016-05-04 DIAGNOSIS — I10 Essential (primary) hypertension: Secondary | ICD-10-CM | POA: Diagnosis not present

## 2016-05-04 DIAGNOSIS — E11649 Type 2 diabetes mellitus with hypoglycemia without coma: Secondary | ICD-10-CM | POA: Diagnosis not present

## 2016-05-04 DIAGNOSIS — R51 Headache: Secondary | ICD-10-CM | POA: Diagnosis not present

## 2016-05-04 DIAGNOSIS — E162 Hypoglycemia, unspecified: Secondary | ICD-10-CM

## 2016-05-04 LAB — CBG MONITORING, ED
Glucose-Capillary: 51 mg/dL — ABNORMAL LOW (ref 65–99)
Glucose-Capillary: 65 mg/dL (ref 65–99)

## 2016-05-04 NOTE — ED Notes (Signed)
Gail Schulz, NP at bedside at this time.  

## 2016-05-04 NOTE — ED Notes (Signed)
Pt CBG assessed at provider request. CBG found to be 51. Provider notified and instructed this tech to give the pt orange juice and peanut butter crackers.

## 2016-05-04 NOTE — ED Notes (Signed)
Patient transported to CT 

## 2016-05-04 NOTE — ED Notes (Signed)
Daughter-in-law, Brieona Mulhall  [phone]

## 2016-05-04 NOTE — ED Provider Notes (Addendum)
[facility] DEPT Provider Note   CSN: [id number] Arrival date & time: 05/04/16  2106     History   Chief Complaint Chief Complaint  Patient presents with  . Fall    HPI Joanna Hill is a 82 y.o. female.  This is a 82 year old female who lives in a facility due to multiple strokes.  She states she was leaving her bathroom pushing her wheelchair when she inadvertently fell striking the back of her head.  She denies any loss of consciousness, weakness.       Past Medical History:  Diagnosis Date  . Atrial fibrillation ([place])   . Blindness   . Cancer ([place])    breast  . Carotid artery occlusion   . Dementia   . Diabetes mellitus   . Hyperlipidemia   . Hypertension   . Stroke [facility]) 2009 and  2011   difficulty with swallowing    Patient Active Problem List   Diagnosis Date Noted  . Hyperkalemia 01/18/2014  . Occlusion and stenosis of carotid artery without mention of cerebral infarction 09/28/2011  . HYPOTENSION 08/05/2008  . DYSPHAGIA 08/05/2008  . ALTERED MENTAL STATUS 08/01/2008  . HYPERLIPIDEMIA 09/25/2007  . HYPERTENSION 09/25/2007  . CORONARY ARTERY DISEASE 09/25/2007    Past Surgical History:  Procedure Laterality Date  . Arch Aortogram  03-15-2010  . MASTECTOMY, PARTIAL Right     OB History    No data available       Home Medications    Prior to Admission medications   Medication Sig Start Date End Date Taking? Authorizing Provider  acetaminophen (TYLENOL) 325 MG tablet Take 650 mg by mouth every 6 (six) hours as needed for mild pain, moderate pain, fever or headache.     Historical Provider, MD  amLODipine (NORVASC) 10 MG tablet Take 10 mg by mouth daily.    Historical Provider, MD  antiseptic oral rinse (BIOTENE) LIQD 5 mLs by Mouth Rinse route 2 (two) times daily.    Historical Provider, MD  calcium carbonate (OS-CAL) 1250 (500 CA) MG chewable tablet Chew 1 tablet by mouth daily.    Historical Provider, MD  cetirizine (ZYRTEC) 10 MG tablet  Take 10 mg by mouth at bedtime.    Historical Provider, MD  Cholecalciferol (VITAMIN D-3) 1000 UNITS CAPS Take 1,000 Units by mouth daily.    Historical Provider, MD  divalproex (DEPAKOTE ER) 250 MG 24 hr tablet Take 250 mg by mouth 2 (two) times daily.  12/23/15   Historical Provider, MD  docusate sodium (COLACE) 100 MG capsule Take 100 mg by mouth 2 (two) times daily.    Historical Provider, MD  donepezil (ARICEPT) 10 MG tablet Take 10 mg by mouth at bedtime.    Historical Provider, MD  Eyelid Cleansers (OCUSOFT LID SCRUB) PADS Apply 1 each topically 2 (two) times daily.    Historical Provider, MD  fluticasone (FLONASE) 50 MCG/ACT nasal spray Place 1 spray into both nostrils 2 (two) times daily.     Historical Provider, MD  furosemide (LASIX) 20 MG tablet Take 40 mg by mouth 2 (two) times daily.     Historical Provider, MD  gabapentin (NEURONTIN) 300 MG capsule Take 300 mg by mouth 2 (two) times daily.    Historical Provider, MD  guaiFENesin (ROBITUSSIN) 100 MG/5ML liquid Take 400 mg by mouth 4 (four) times daily as needed for cough.     Historical Provider, MD  hydrocortisone (ANUSOL-HC) 2.5 % rectal cream Place 1 application rectally 3 (three) times daily  as needed for hemorrhoids.     Historical Provider, MD  hydrOXYzine (ATARAX/VISTARIL) 10 MG tablet Take 10 mg by mouth daily as needed for anxiety.  01/19/16   Historical Provider, MD  insulin lispro (HUMALOG) 100 UNIT/ML injection Inject 0-4 Units into the skin 3 (three) times daily before meals. <120=0  120-150=2 units  >151=4 units    Historical Provider, MD  insulin lispro (HUMALOG) 100 UNIT/ML injection Inject 7 Units into the skin daily with breakfast.     Historical Provider, MD  ipratropium-albuterol (DUONEB) 0.5-2.5 (3) MG/3ML SOLN Take 3 mLs by nebulization daily as needed (shortness of breath/wheezing).    Historical Provider, MD  ketotifen (ZADITOR) 0.025 % ophthalmic solution Place 1 drop into both eyes 2 (two) times daily.      Historical Provider, MD  LEVEMIR 100 UNIT/ML injection Inject 20-35 Units into the skin 2 (two) times daily. 35 units every morning and 20 units every evening. 12/07/15   Historical Provider, MD  levothyroxine (SYNTHROID, LEVOTHROID) 75 MCG tablet Take 75 mcg by mouth daily before breakfast.  01/03/16   Historical Provider, MD  metoprolol tartrate (LOPRESSOR) 25 MG tablet Take 25 mg by mouth 2 (two) times daily.    Historical Provider, MD  montelukast (SINGULAIR) 10 MG tablet Take 10 mg by mouth daily.     Historical Provider, MD  nystatin (MYCOSTATIN) 100000 UNIT/ML suspension Take 10 mLs by mouth 4 (four) times daily.  12/28/15   Historical Provider, MD  omeprazole (PRILOSEC) 20 MG capsule Take 1 capsule (20 mg total) by mouth 2 (two) times daily before a meal. 30 min prior to meals 06/17/15   Milus Banister, MD  ondansetron (ZOFRAN) 4 MG tablet Take 4 mg by mouth every 6 (six) hours as needed for nausea or vomiting.     Historical Provider, MD  potassium chloride SA (K-DUR,KLOR-CON) 20 MEQ tablet Take 1 tablet (20 mEq total) by mouth daily. Patient taking differently: Take 20 mEq by mouth 2 (two) times daily.  01/19/14   Velna Hatchet, MD  pravastatin (PRAVACHOL) 20 MG tablet Take 20 mg by mouth at bedtime.    Historical Provider, MD  ranitidine (ZANTAC) 150 MG tablet Take 150 mg by mouth 2 (two) times daily.    Historical Provider, MD  Rivaroxaban (XARELTO) 15 MG TABS tablet Take 15 mg by mouth daily with supper.    Historical Provider, MD  sertraline (ZOLOFT) 100 MG tablet Take 100 mg by mouth daily. 12/23/15   Historical Provider, MD  simethicone (MYLICON) 80 MG chewable tablet Chew 80 mg by mouth 3 (three) times daily.    Historical Provider, MD  sucralfate (CARAFATE) 1 g tablet Take 1 g by mouth 4 (four) times daily.  01/20/16   Historical Provider, MD  traMADol (ULTRAM) 50 MG tablet Take 50 mg by mouth every 12 (twelve) hours as needed for moderate pain.     Historical Provider, MD  valsartan  (DIOVAN) 160 MG tablet Take 160 mg by mouth daily.    Historical Provider, MD    Family History Family History  Problem Relation Age of Onset  . Cancer Mother   . Heart disease Father     Heart disease before age 36    Social History Social History  Substance Use Topics  . Smoking status: Never Smoker  . Smokeless tobacco: Never Used  . Alcohol use No     Allergies   Review of patient's allergies indicates no known allergies.   Review of Systems Review  of Systems  Constitutional: Negative for activity change.  Skin: Positive for wound.  Neurological: Negative for dizziness and headaches.  All other systems reviewed and are negative.    Physical Exam Updated Vital Signs There were no vitals taken for this visit.  Physical Exam  Constitutional: She appears well-developed and well-nourished.  HENT:  Head: Normocephalic.    Neck:  Left in c-collar until CT scan could be obtained  Cardiovascular: Normal rate.   Pulmonary/Chest: Effort normal.  Abdominal: Soft.  Musculoskeletal: Normal range of motion.  Neurological: She is alert.  Skin: Skin is warm.  Psychiatric: She has a normal mood and affect.  Nursing note and vitals reviewed.    ED Treatments / Results  Labs (all labs ordered are listed, but only abnormal results are displayed) Labs Reviewed - No data to display  EKG  EKG Interpretation None       Radiology No results found.  Procedures .Marland KitchenLaceration Repair Date/Time: 05/04/2016 11:28 PM Performed by: Junius Creamer Authorized by: Junius Creamer   Consent:    Consent obtained:  Verbal   Risks discussed:  Infection Laceration details:    Location:  Scalp   Length (cm):  1   Depth (mm):  2 Repair type:    Repair type:  Simple Pre-procedure details:    Preparation:  Patient was prepped and draped in usual sterile fashion Exploration:    Contaminated: no   Treatment:    Area cleansed with:  Soap and water   Amount of cleaning:   Standard   Visualized foreign bodies/material removed: no   Skin repair:    Repair method:  Staples   Number of staples:  2 Approximation:    Approximation:  Loose   Vermilion border: well-aligned   Post-procedure details:    Dressing:  Open (no dressing)   (including critical care time)  Medications Ordered in ED Medications - No data to display   Initial Impression / Assessment and Plan / ED Course  I have reviewed the triage vital signs and the nursing notes.  Pertinent labs & imaging results that were available during visit CT Scan negative  Staple applied to wound  Tolerated well  Blood sugar noted to be 52 given PO orange juice as well as snack   At time of DC BS was 118   Final Clinical Impressions(s) / ED Diagnoses   Final diagnoses:  None    New Prescriptions New Prescriptions   No medications on file     Junius Creamer, NP 05/04/16 2132    Alfonzo Beers, MD 05/04/16 2326    Junius Creamer, NP 05/05/16 0020    Alfonzo Beers, MD 05/05/16 [phone]

## 2016-05-04 NOTE — ED Triage Notes (Signed)
Patient arrived to ED via [facility]. EMS reports: Patient resident at [facility] and [facility]. Staff reports patient fell from wheelchair striking her head on door frame. Unknown LOC. Laceration posterior head. Patient is on Xarelto for Atrial Fibrillation. C/o neck and head pain. C-collar in place upon arrival.  VSS. BP 116/66, Pulse 94 Irregular, Resp 18, 96% on room air. CBG 82.

## 2016-05-04 NOTE — ED Notes (Signed)
Provided patient with orange juice.

## 2016-05-05 DIAGNOSIS — R58 Hemorrhage, not elsewhere classified: Secondary | ICD-10-CM | POA: Diagnosis not present

## 2016-05-05 DIAGNOSIS — R531 Weakness: Secondary | ICD-10-CM | POA: Diagnosis not present

## 2016-05-05 DIAGNOSIS — R51 Headache: Secondary | ICD-10-CM | POA: Diagnosis not present

## 2016-05-05 DIAGNOSIS — S0101XA Laceration without foreign body of scalp, initial encounter: Secondary | ICD-10-CM | POA: Diagnosis not present

## 2016-05-05 DIAGNOSIS — S0990XA Unspecified injury of head, initial encounter: Secondary | ICD-10-CM | POA: Diagnosis not present

## 2016-05-05 LAB — CBG MONITORING, ED: GLUCOSE-CAPILLARY: 118 mg/dL — AB (ref 65–99)

## 2016-05-05 NOTE — ED Notes (Signed)
[facility] arrived to transport patient to SNF facility.

## 2016-05-05 NOTE — ED Notes (Signed)
Cup of orange juice with 4 packets of sugar added provided to patient.

## 2016-05-05 NOTE — Discharge Instructions (Signed)
Staples should be removed in 10 days  Please check blood sugar on arrival back to unit

## 2016-05-12 DIAGNOSIS — H401123 Primary open-angle glaucoma, left eye, severe stage: Secondary | ICD-10-CM | POA: Diagnosis not present

## 2016-05-18 DIAGNOSIS — F0391 Unspecified dementia with behavioral disturbance: Secondary | ICD-10-CM | POA: Diagnosis not present

## 2016-05-18 DIAGNOSIS — L299 Pruritus, unspecified: Secondary | ICD-10-CM | POA: Diagnosis not present

## 2016-05-18 DIAGNOSIS — R21 Rash and other nonspecific skin eruption: Secondary | ICD-10-CM | POA: Diagnosis not present

## 2016-05-18 DIAGNOSIS — R2689 Other abnormalities of gait and mobility: Secondary | ICD-10-CM | POA: Diagnosis not present

## 2016-05-18 DIAGNOSIS — E119 Type 2 diabetes mellitus without complications: Secondary | ICD-10-CM | POA: Diagnosis not present

## 2016-05-18 DIAGNOSIS — Z5181 Encounter for therapeutic drug level monitoring: Secondary | ICD-10-CM | POA: Diagnosis not present

## 2016-05-18 DIAGNOSIS — R296 Repeated falls: Secondary | ICD-10-CM | POA: Diagnosis not present

## 2016-05-18 DIAGNOSIS — Z79899 Other long term (current) drug therapy: Secondary | ICD-10-CM | POA: Diagnosis not present

## 2016-05-18 DIAGNOSIS — D649 Anemia, unspecified: Secondary | ICD-10-CM | POA: Diagnosis not present

## 2016-05-18 DIAGNOSIS — E785 Hyperlipidemia, unspecified: Secondary | ICD-10-CM | POA: Diagnosis not present

## 2016-05-18 DIAGNOSIS — N183 Chronic kidney disease, stage 3 (moderate): Secondary | ICD-10-CM | POA: Diagnosis not present

## 2016-05-19 DIAGNOSIS — R296 Repeated falls: Secondary | ICD-10-CM | POA: Diagnosis not present

## 2016-05-19 DIAGNOSIS — R2689 Other abnormalities of gait and mobility: Secondary | ICD-10-CM | POA: Diagnosis not present

## 2016-05-20 DIAGNOSIS — R296 Repeated falls: Secondary | ICD-10-CM | POA: Diagnosis not present

## 2016-05-20 DIAGNOSIS — R2689 Other abnormalities of gait and mobility: Secondary | ICD-10-CM | POA: Diagnosis not present

## 2016-05-23 DIAGNOSIS — R2689 Other abnormalities of gait and mobility: Secondary | ICD-10-CM | POA: Diagnosis not present

## 2016-05-23 DIAGNOSIS — R296 Repeated falls: Secondary | ICD-10-CM | POA: Diagnosis not present

## 2016-05-24 DIAGNOSIS — R296 Repeated falls: Secondary | ICD-10-CM | POA: Diagnosis not present

## 2016-05-24 DIAGNOSIS — R2689 Other abnormalities of gait and mobility: Secondary | ICD-10-CM | POA: Diagnosis not present

## 2016-05-25 DIAGNOSIS — R296 Repeated falls: Secondary | ICD-10-CM | POA: Diagnosis not present

## 2016-05-25 DIAGNOSIS — R2689 Other abnormalities of gait and mobility: Secondary | ICD-10-CM | POA: Diagnosis not present

## 2016-05-26 DIAGNOSIS — R296 Repeated falls: Secondary | ICD-10-CM | POA: Diagnosis not present

## 2016-05-26 DIAGNOSIS — R2689 Other abnormalities of gait and mobility: Secondary | ICD-10-CM | POA: Diagnosis not present

## 2016-05-30 DIAGNOSIS — R2689 Other abnormalities of gait and mobility: Secondary | ICD-10-CM | POA: Diagnosis not present

## 2016-05-30 DIAGNOSIS — R296 Repeated falls: Secondary | ICD-10-CM | POA: Diagnosis not present

## 2016-05-31 ENCOUNTER — Encounter: Payer: Self-pay | Admitting: Allergy and Immunology

## 2016-05-31 ENCOUNTER — Ambulatory Visit (INDEPENDENT_AMBULATORY_CARE_PROVIDER_SITE_OTHER): Payer: Medicare Other | Admitting: Allergy and Immunology

## 2016-05-31 VITALS — BP 110/60 | HR 80 | Temp 97.4°F | Ht 64.5 in

## 2016-05-31 DIAGNOSIS — L299 Pruritus, unspecified: Secondary | ICD-10-CM

## 2016-05-31 DIAGNOSIS — R296 Repeated falls: Secondary | ICD-10-CM | POA: Diagnosis not present

## 2016-05-31 DIAGNOSIS — R2689 Other abnormalities of gait and mobility: Secondary | ICD-10-CM | POA: Diagnosis not present

## 2016-05-31 MED ORDER — MUPIROCIN 2 % EX OINT: TOPICAL_OINTMENT | CUTANEOUS | 0 refills | Status: DC

## 2016-05-31 MED ORDER — METHYLPREDNISOLONE ACETATE 80 MG/ML IJ SUSP
80.0000 mg | Freq: Once | INTRAMUSCULAR | Status: AC
  Administered 2016-05-31: 80 mg via INTRAMUSCULAR

## 2016-05-31 MED ORDER — CETIRIZINE HCL 10 MG PO TABS: 10.0000 mg | ORAL_TABLET | Freq: Two times a day (BID) | ORAL | 5 refills | Status: DC

## 2016-05-31 NOTE — Patient Instructions (Addendum)
  1. Stop vitamins and all supplements   2. Blood - CBC w/diff, antimitochondrial antibody   3. Use a combination of the following medications every day:   A. Cetirizine 10mg  one tablet two times per day  B. Ranitidine 150 one tablet two times per day   4. Depo-Medrol 80 IM delivered in clinic today  5. Will need to keep close eye on blood sugars and maybe adjust insulin dose  6. Apply Bactroban ointment to right neck 3 times a day for the next 7-10 days until resolved  7. May may require elimination of medications to determine trigger for itchiness  8. Return to clinic in 2 weeks

## 2016-05-31 NOTE — Progress Notes (Signed)
Dear Dr. Forde Dandy,  Thank you for referring Joanna Hill to the [facility] on 05/31/2016.   Below is a summation of this patient's evaluation and recommendations.  Thank you for your referral. I will keep you informed about this patient's response to treatment.   If you have any questions please to do hesitate to contact me.   Sincerely,  Jiles Prows, MD [place] [facility]   ______________________________________________________________________    NEW PATIENT NOTE  Referring Provider: Reynold Bowen, MD Primary Provider: Sheela Stack, MD Date of office visit: 05/31/2016    Subjective:   Chief Complaint:  Joanna Hill (DOB: Aug 30, 1931) is a 82 y.o. female who presents to the clinic on 05/31/2016 with a chief complaint of Pruritis .     HPI: Joanna Hill presents this clinic in evaluation of itchiness. She's been itchy for one year. She has been 6 Cory 8 and her skin extensively. Apparently she is seen a dermatologist for this issue but no specific diagnosis or treatment was recommended. All therapies to date that have been administered have been ineffective in alleviating her perpetual pruritus and excoriation. She is losing sleep because of this issue. There is no obvious trigger giving rise to this problem. She is not really that cognizant of the medications that she is using and whether or not there is a temporal relationship between starting one of their medications and the development of her pruritus. It does not sound as though she's had elimination of various medications to see if the etiologic factor can be identified assuming that it is a medication administration. Over the course of the past week she is developed a nodule on her right neck from her excoriation.  She apparently uses Flonase and other respiratory medications but she is very vague about the use of these medications and exactly  why they have been prescribed. It does not sound as though she has a tremendous amount of respiratory tract symptoms at this point.  She does appear to have reflux and apparently this is under good control with a large combination of medical therapy and it is not been particularly active.  She is wheelchair-bound and does not have that many complaints about aches of her muscles or aches of her joints.  Past Medical History:  Diagnosis Date  . Atrial fibrillation ([place])   . Blindness   . Cancer ([place])    breast  . Carotid artery occlusion   . Dementia   . Diabetes mellitus   . GERD (gastroesophageal reflux disease)   . Hyperlipidemia   . Hypertension   . Hypothyroidism   . Stroke [facility]) 2009 and  2011   difficulty with swallowing    Past Surgical History:  Procedure Laterality Date  . ABDOMINAL HYSTERECTOMY    . Arch Aortogram  03-15-2010  . MASTECTOMY, PARTIAL Right   . ROTATOR CUFF REPAIR Left 1981      Medication List      acetaminophen 325 MG tablet Commonly known as:  TYLENOL Take 650 mg by mouth every 6 (six) hours as needed for mild pain, moderate pain, fever or headache.   amLODipine 10 MG tablet Commonly known as:  NORVASC Take 10 mg by mouth daily.   antiseptic oral rinse Liqd 5 mLs by Mouth Rinse route 2 (two) times daily.   calcium carbonate 1250 (500 Ca) MG chewable tablet Commonly known as:  OS-CAL Chew 1 tablet  by mouth daily.   Carboxymethylcellulose Sodium 0.25 % Soln Apply 1 drop to eye 4 (four) times daily.   CENTRUM SILVER Chew Chew 1 tablet by mouth daily.   cetirizine 10 MG tablet Commonly known as:  ZYRTEC Take 10 mg by mouth at bedtime.   CITRUCEL oral powder Generic drug:  methylcellulose Take 1 packet by mouth daily.   clobetasol cream 0.05 % Commonly known as:  TEMOVATE Apply 1 application topically daily.   dapsone 25 MG tablet Take 25 mg by mouth daily.   diphenhydrAMINE 25 mg capsule Commonly known as:  BENADRYL Take  25 mg by mouth every 6 (six) hours as needed.   docusate sodium 100 MG capsule Commonly known as:  COLACE Take 100 mg by mouth 2 (two) times daily.   donepezil 10 MG tablet Commonly known as:  ARICEPT Take 10 mg by mouth at bedtime.   fluticasone 50 MCG/ACT nasal spray Commonly known as:  FLONASE Place 1 spray into both nostrils 2 (two) times daily.   furosemide 20 MG tablet Commonly known as:  LASIX Take 40 mg by mouth 2 (two) times daily.   gabapentin 300 MG capsule Commonly known as:  NEURONTIN Take 300 mg by mouth 2 (two) times daily.   guaiFENesin 100 MG/5ML liquid Commonly known as:  ROBITUSSIN Take 400 mg by mouth 4 (four) times daily as needed for cough.   hydrocortisone 2.5 % rectal cream Commonly known as:  ANUSOL-HC Place 1 application rectally 3 (three) times daily as needed for hemorrhoids.   hydroxypropyl methylcellulose / hypromellose 2.5 % ophthalmic solution Commonly known as:  ISOPTO TEARS / GONIOVISC Place 1 drop into both eyes 4 (four) times daily.   hydrOXYzine 10 MG tablet Commonly known as:  ATARAX/VISTARIL Take 10 mg by mouth daily as needed for anxiety.   insulin detemir 100 UNIT/ML injection Commonly known as:  LEVEMIR Inject 25-40 Units into the skin 2 (two) times daily. Use 40 units every morning and use 25 units every evening   insulin lispro 100 UNIT/ML injection Commonly known as:  HUMALOG Inject 7 Units into the skin daily with breakfast.   ipratropium-albuterol 0.5-2.5 (3) MG/3ML Soln Commonly known as:  DUONEB Take 3 mLs by nebulization daily as needed (shortness of breath/wheezing).   ketotifen 0.025 % ophthalmic solution Commonly known as:  ZADITOR Place 1 drop into both eyes 2 (two) times daily.   levothyroxine 75 MCG tablet Commonly known as:  SYNTHROID, LEVOTHROID Take 75 mcg by mouth daily before breakfast.   loperamide 2 MG tablet Commonly known as:  IMODIUM A-D Take 2 mg by mouth 4 (four) times daily as needed for  diarrhea or loose stools.   LUVOX CR 150 MG Cp24 Generic drug:  Fluvoxamine Maleate Take 150 mg by mouth at bedtime.   metoprolol tartrate 25 MG tablet Commonly known as:  LOPRESSOR Take 25 mg by mouth 2 (two) times daily.   mirtazapine 15 MG tablet Commonly known as:  REMERON Take 15 mg by mouth at bedtime.   montelukast 10 MG tablet Commonly known as:  SINGULAIR Take 10 mg by mouth daily.   neomycin-polymyxin-dexameth 0.1 % Oint Commonly known as:  MAXITROL Place 1 application into both eyes at bedtime.   nystatin 100000 UNIT/ML suspension Commonly known as:  MYCOSTATIN Take 10 mLs by mouth 4 (four) times daily.   OCUSOFT LID SCRUB Pads Apply 1 each topically 2 (two) times daily.   omeprazole 20 MG capsule Commonly known as:  PRILOSEC Take  1 capsule (20 mg total) by mouth 2 (two) times daily before a meal. 30 min prior to meals   ondansetron 4 MG tablet Commonly known as:  ZOFRAN Take 4 mg by mouth every 6 (six) hours as needed for nausea or vomiting.   phenol 1.4 % Liqd Commonly known as:  CHLORASEPTIC Use as directed 2 sprays in the mouth or throat every 4 (four) hours as needed for throat irritation / pain.   potassium chloride SA 20 MEQ tablet Commonly known as:  K-DUR,KLOR-CON Take 1 tablet (20 mEq total) by mouth daily.   pravastatin 20 MG tablet Commonly known as:  PRAVACHOL Take 20 mg by mouth at bedtime.   ranitidine 150 MG tablet Commonly known as:  ZANTAC Take 150 mg by mouth 2 (two) times daily.   Rivaroxaban 15 MG Tabs tablet Commonly known as:  XARELTO Take 15 mg by mouth daily with supper.   simethicone 80 MG chewable tablet Commonly known as:  MYLICON Chew 80 mg by mouth 3 (three) times daily.   sodium chloride 0.65 % Soln nasal spray Commonly known as:  OCEAN Place 1 spray into both nostrils as needed for congestion.   sucralfate 1 g tablet Commonly known as:  CARAFATE Take 1 g by mouth 4 (four) times daily.   traMADol 50 MG  tablet Commonly known as:  ULTRAM Take 50 mg by mouth every 12 (twelve) hours as needed for moderate pain.   triamcinolone cream 0.1 % Commonly known as:  KENALOG Apply 1 application topically 3 (three) times daily.   valsartan 160 MG tablet Commonly known as:  DIOVAN Take 160 mg by mouth daily.   Vitamin D-3 1000 units Caps Take 1,000 Units by mouth daily.   white petrolatum Gel Commonly known as:  VASELINE Apply 1 application topically at bedtime. To both eyes for dry eyes       No Known Allergies  Review of systems negative except as noted in HPI / PMHx or noted below:  Review of Systems  Constitutional: Negative.   HENT: Negative.   Eyes: Negative.   Respiratory: Negative.   Cardiovascular: Negative.   Gastrointestinal: Negative.   Genitourinary: Negative.   Musculoskeletal: Negative.   Skin: Negative.   Neurological: Negative.   Endo/Heme/Allergies: Negative.   Psychiatric/Behavioral: Negative.     Family History  Problem Relation Age of Onset  . Cancer Mother   . Heart disease Father     Heart disease before age 59  . Asthma Sister   . Diabetes Sister   . Diabetes Sister   . Diabetes Sister   . Diabetes Sister   . Diabetes Sister   . Diabetes Sister   . Diabetes Sister   . Diabetes Sister   . Diabetes Sister     Social History   Social History  . Marital status: Widowed    Spouse name: N/A  . Number of children: 3  . Years of education: 1st   Occupational History  . Not on file.   Social History Main Topics  . Smoking status: Never Smoker  . Smokeless tobacco: Former Systems developer    Types: Snuff  . Alcohol use No  . Drug use: No  . Sexual activity: Not on file   Other Topics Concern  . Not on file   Social History Narrative   Patient is a resident at [facility], has 3 children   Patient is left handed   Educational level 1st grade   Caffeine  consumption is 1-2 daily    Environmental and Social history  Lives  in a nursing home with a dry environment, no animals located inside the household, Tylenol in the bedroom, plastic on the bed and pillow, and no smoking ongoing with inside the house.  Objective:   Vitals:   05/31/16 1419  BP: 110/60  Pulse: 80  Temp: 97.4 F (36.3 C)   Height: 5' 4.5" (163.8 cm)    Physical Exam  Constitutional: She is well-developed, well-nourished, and in no distress.  Raspy, dysarthric voice, wheelchair  HENT:  Head: Normocephalic.  Right Ear: Tympanic membrane, external ear and ear canal normal.  Left Ear: Tympanic membrane, external ear and ear canal normal.  Nose: Nose normal. No mucosal edema or rhinorrhea.  Mouth/Throat: Uvula is midline, oropharynx is clear and moist and mucous membranes are normal. No oropharyngeal exudate.  Eyes: Conjunctivae are normal.  Neck: Trachea normal. No tracheal tenderness present. No tracheal deviation present. No thyromegaly present.  Cardiovascular: Normal rate, regular rhythm, S1 normal, S2 normal and normal heart sounds.   No murmur heard. Pulmonary/Chest: Breath sounds normal. No stridor. No respiratory distress. She has no wheezes. She has no rales.  Musculoskeletal: She exhibits no edema.  Lymphadenopathy:       Head (right side): No tonsillar adenopathy present.       Head (left side): No tonsillar adenopathy present.    She has no cervical adenopathy.  Neurological: She is alert.  Skin: Rash (Widespread evidence of excoriation with lichenification and slight erythema and areas of denuded skin across her arms. Her right neck had two 1 cm diameter superficial non-firm, nontender nodules with excoriation and unroofing of central part of nodules.) noted. She is not diaphoretic. No erythema. Nails show no clubbing.    Diagnostics: Review blood tests forwarded by Dr. Roque Cash dated 02/23/2016 3 first 2 white blood cell count of 5.2 with a relatively normal differential and hemoglobin 13.1 with a platelet count 234.  Her creatinine was 1.37 and she had normal ALT and AST and alkaline phosphatase of 140 units/L. her sedimentation rate was 38 and her hemoglobin A1c was 7.5%  Review blood tests noted in the Epic system refers to a normal CBC other than a hemoglobin 11.6 without any differential obtained on 01/23/2016  Assessment and Plan:    1. Pruritic disorder     1. Stop all vitamins and all supplements   2. Blood - CBC w/diff, antimitochondrial antibody, TSH, T4, T.P.   3. Use a combination of the following medications every day:   A. Cetirizine 10mg  one tablet two times per day  B. Ranitidine 150 one tablet two times per day   4. Depo-Medrol 80 IM delivered in clinic today  5. Will need to keep close eye on blood sugars and maybe adjust insulin dose  6. Apply Bactroban ointment to right neck 3 times a day for the next 7-10 days until resolved  7. May may require elimination of medications to determine trigger for itchiness  8. Return to clinic in 2 weeks  I do not know the source of Emmaleigh's immunological hyperreactivity manifested as  pruritus but I suspect that this is probably a medication side effect. In review of her blood tests she does not appear to have a significant abnormality identified to date.  I will rule out the possibility of PBC giving rise to her persistent pruritus we'll check an antimitochondrial antibody. I'm going to ask her to use an H1 and H2  receptor blocker on a consistent basis. I've given her a systemic steroid today and she will need to keep a close eye and her blood sugars and maybe adjust her insulin dose while utilizing this form of therapy. I've given her Bactroban ointment to cover the possibility that there is a secondary infection giving rise to the nodules on her right neck. I think we are going to end up tapering down a lot of her medications over the course of the next month or so in an attempt to identify the etiologic agent responsible for her  pruritus.  Jiles Prows, MD [facility]

## 2016-06-01 DIAGNOSIS — R296 Repeated falls: Secondary | ICD-10-CM | POA: Diagnosis not present

## 2016-06-01 DIAGNOSIS — R2689 Other abnormalities of gait and mobility: Secondary | ICD-10-CM | POA: Diagnosis not present

## 2016-06-01 DIAGNOSIS — D649 Anemia, unspecified: Secondary | ICD-10-CM | POA: Diagnosis not present

## 2016-06-02 DIAGNOSIS — N183 Chronic kidney disease, stage 3 (moderate): Secondary | ICD-10-CM | POA: Diagnosis not present

## 2016-06-02 DIAGNOSIS — R296 Repeated falls: Secondary | ICD-10-CM | POA: Diagnosis not present

## 2016-06-02 DIAGNOSIS — I2510 Atherosclerotic heart disease of native coronary artery without angina pectoris: Secondary | ICD-10-CM | POA: Diagnosis not present

## 2016-06-02 DIAGNOSIS — E1129 Type 2 diabetes mellitus with other diabetic kidney complication: Secondary | ICD-10-CM | POA: Diagnosis not present

## 2016-06-02 DIAGNOSIS — R2689 Other abnormalities of gait and mobility: Secondary | ICD-10-CM | POA: Diagnosis not present

## 2016-06-02 DIAGNOSIS — I638 Other cerebral infarction: Secondary | ICD-10-CM | POA: Diagnosis not present

## 2016-06-02 DIAGNOSIS — C50919 Malignant neoplasm of unspecified site of unspecified female breast: Secondary | ICD-10-CM | POA: Diagnosis not present

## 2016-06-02 DIAGNOSIS — I4891 Unspecified atrial fibrillation: Secondary | ICD-10-CM | POA: Diagnosis not present

## 2016-06-02 DIAGNOSIS — F322 Major depressive disorder, single episode, severe without psychotic features: Secondary | ICD-10-CM | POA: Diagnosis not present

## 2016-06-02 DIAGNOSIS — F329 Major depressive disorder, single episode, unspecified: Secondary | ICD-10-CM | POA: Diagnosis not present

## 2016-06-02 DIAGNOSIS — E038 Other specified hypothyroidism: Secondary | ICD-10-CM | POA: Diagnosis not present

## 2016-06-02 DIAGNOSIS — D6489 Other specified anemias: Secondary | ICD-10-CM | POA: Diagnosis not present

## 2016-06-02 DIAGNOSIS — I10 Essential (primary) hypertension: Secondary | ICD-10-CM | POA: Diagnosis not present

## 2016-06-02 DIAGNOSIS — L299 Pruritus, unspecified: Secondary | ICD-10-CM | POA: Diagnosis not present

## 2016-06-02 DIAGNOSIS — N182 Chronic kidney disease, stage 2 (mild): Secondary | ICD-10-CM | POA: Diagnosis not present

## 2016-06-02 DIAGNOSIS — F0390 Unspecified dementia without behavioral disturbance: Secondary | ICD-10-CM | POA: Diagnosis not present

## 2016-06-02 DIAGNOSIS — I480 Paroxysmal atrial fibrillation: Secondary | ICD-10-CM | POA: Diagnosis not present

## 2016-06-02 DIAGNOSIS — R600 Localized edema: Secondary | ICD-10-CM | POA: Diagnosis not present

## 2016-06-04 DIAGNOSIS — N390 Urinary tract infection, site not specified: Secondary | ICD-10-CM | POA: Diagnosis not present

## 2016-06-04 DIAGNOSIS — R319 Hematuria, unspecified: Secondary | ICD-10-CM | POA: Diagnosis not present

## 2016-06-06 DIAGNOSIS — R2689 Other abnormalities of gait and mobility: Secondary | ICD-10-CM | POA: Diagnosis not present

## 2016-06-06 DIAGNOSIS — R296 Repeated falls: Secondary | ICD-10-CM | POA: Diagnosis not present

## 2016-06-07 DIAGNOSIS — R2689 Other abnormalities of gait and mobility: Secondary | ICD-10-CM | POA: Diagnosis not present

## 2016-06-07 DIAGNOSIS — R296 Repeated falls: Secondary | ICD-10-CM | POA: Diagnosis not present

## 2016-06-08 DIAGNOSIS — R2689 Other abnormalities of gait and mobility: Secondary | ICD-10-CM | POA: Diagnosis not present

## 2016-06-08 DIAGNOSIS — R296 Repeated falls: Secondary | ICD-10-CM | POA: Diagnosis not present

## 2016-06-09 DIAGNOSIS — R2689 Other abnormalities of gait and mobility: Secondary | ICD-10-CM | POA: Diagnosis not present

## 2016-06-09 DIAGNOSIS — R296 Repeated falls: Secondary | ICD-10-CM | POA: Diagnosis not present

## 2016-06-10 DIAGNOSIS — R2689 Other abnormalities of gait and mobility: Secondary | ICD-10-CM | POA: Diagnosis not present

## 2016-06-10 DIAGNOSIS — R296 Repeated falls: Secondary | ICD-10-CM | POA: Diagnosis not present

## 2016-06-13 DIAGNOSIS — R2689 Other abnormalities of gait and mobility: Secondary | ICD-10-CM | POA: Diagnosis not present

## 2016-06-13 DIAGNOSIS — R296 Repeated falls: Secondary | ICD-10-CM | POA: Diagnosis not present

## 2016-06-14 ENCOUNTER — Encounter: Payer: Self-pay | Admitting: Allergy and Immunology

## 2016-06-14 ENCOUNTER — Encounter (INDEPENDENT_AMBULATORY_CARE_PROVIDER_SITE_OTHER): Payer: Self-pay

## 2016-06-14 ENCOUNTER — Ambulatory Visit (INDEPENDENT_AMBULATORY_CARE_PROVIDER_SITE_OTHER): Payer: Medicare Other | Admitting: Allergy and Immunology

## 2016-06-14 VITALS — BP 108/70 | HR 76 | Resp 22

## 2016-06-14 DIAGNOSIS — L299 Pruritus, unspecified: Secondary | ICD-10-CM

## 2016-06-14 DIAGNOSIS — R296 Repeated falls: Secondary | ICD-10-CM | POA: Diagnosis not present

## 2016-06-14 DIAGNOSIS — R2689 Other abnormalities of gait and mobility: Secondary | ICD-10-CM | POA: Diagnosis not present

## 2016-06-14 MED ORDER — MOMETASONE FUROATE 0.1 % EX OINT: TOPICAL_OINTMENT | CUTANEOUS | 3 refills | Status: DC

## 2016-06-14 NOTE — Patient Instructions (Addendum)
  1. Obtain new blood test - Celiac screen with IgA  2. Use a combination of the following medications every day:   A. Cetirizine 10mg  one tablet two times per day  B. Ranitidine 150 one tablet two times per day   C. Montelukast 10mg  one tablet one time per day  3. Apply Mometasone ointment 0.1% to skin after bath or shower one time per day  4. Eliminate Citrucel, Vitamin D, Dapsone, calcium, all steroid creams, pravachol, guaifenesin, Gas-x   5. Return to clinic in 4 weeks

## 2016-06-14 NOTE — Progress Notes (Signed)
Follow-up Note  Referring Provider: Reynold Bowen, MD Primary Provider: Sheela Stack, MD Date of Office Visit: 06/14/2016  Subjective:   Joanna Hill (DOB: Oct 14, 1931) is a 82 y.o. female who returns to the [facility] on 06[facility]14/2016 in re-evaluation of the following:  HPI: Joanna Hill returns to this clinic in evaluation of her pruritus. She is not that much better. She still has itchiness that is driving her crazy. As well she has developed a "pasty" taste in her mouth and she thinks that there is an irritation around her eyes. Her skin lesions have improved significantly but she is still itchy.    Medication List      acetaminophen 325 MG tablet Commonly known as:  TYLENOL Take 650 mg by mouth every 6 (six) hours as needed for mild pain, moderate pain, fever or headache.   amLODipine 10 MG tablet Commonly known as:  NORVASC Take 10 mg by mouth daily.   antiseptic oral rinse Liqd 5 mLs by Mouth Rinse route 2 (two) times daily.   ARTIFICIAL TEARS OP Apply to eye.   Carboxymethylcellulose Sodium 0.25 % Soln Apply 1 drop to eye 4 (four) times daily.   CENTRUM SILVER Chew Chew 1 tablet by mouth daily.   cetirizine 10 MG tablet Commonly known as:  ZYRTEC Take 10 mg by mouth at bedtime.   cetirizine 10 MG tablet Commonly known as:  ZYRTEC Take 1 tablet (10 mg total) by mouth 2 (two) times daily.   CITRUCEL oral powder Generic drug:  methylcellulose Take 1 packet by mouth daily.   clobetasol cream 0.05 % Commonly known as:  TEMOVATE Apply 1 application topically daily.   dapsone 25 MG tablet Take 25 mg by mouth daily.   diphenhydrAMINE 25 mg capsule Commonly known as:  BENADRYL Take 25 mg by mouth every 6 (six) hours as needed.   docusate sodium 100 MG capsule Commonly known as:  COLACE Take 100 mg by mouth 2 (two) times daily.   donepezil 10 MG tablet Commonly known as:  ARICEPT Take 10 mg by mouth at bedtime.   fluticasone 50 MCG/ACT  nasal spray Commonly known as:  FLONASE Place 1 spray into both nostrils 2 (two) times daily.   furosemide 20 MG tablet Commonly known as:  LASIX Take 40 mg by mouth 2 (two) times daily.   gabapentin 300 MG capsule Commonly known as:  NEURONTIN Take 300 mg by mouth 2 (two) times daily.   guaiFENesin 100 MG/5ML liquid Commonly known as:  ROBITUSSIN Take 400 mg by mouth 4 (four) times daily as needed for cough.   hydrocortisone 2.5 % rectal cream Commonly known as:  ANUSOL-HC Place 1 application rectally 3 (three) times daily as needed for hemorrhoids.   hydroxypropyl methylcellulose / hypromellose 2.5 % ophthalmic solution Commonly known as:  ISOPTO TEARS / GONIOVISC Place 1 drop into both eyes 4 (four) times daily.   hydrOXYzine 10 MG tablet Commonly known as:  ATARAX/VISTARIL Take 10 mg by mouth daily as needed for anxiety.   insulin detemir 100 UNIT/ML injection Commonly known as:  LEVEMIR Inject 25-40 Units into the skin 2 (two) times daily. Use 40 units every morning and use 25 units every evening   insulin lispro 100 UNIT/ML injection Commonly known as:  HUMALOG Inject 7 Units into the skin daily with breakfast.   ipratropium-albuterol 0.5-2.5 (3) MG/3ML Soln Commonly known as:  DUONEB Take 3 mLs by nebulization daily as needed (shortness of breath/wheezing).  ketotifen 0.025 % ophthalmic solution Commonly known as:  ZADITOR Place 1 drop into both eyes 2 (two) times daily.   levothyroxine 75 MCG tablet Commonly known as:  SYNTHROID, LEVOTHROID Take 75 mcg by mouth daily before breakfast.   loperamide 2 MG tablet Commonly known as:  IMODIUM A-D Take 2 mg by mouth 4 (four) times daily as needed for diarrhea or loose stools.   LORazepam 0.5 MG tablet Commonly known as:  ATIVAN   LUVOX CR 150 MG Cp24 Generic drug:  Fluvoxamine Maleate Take 150 mg by mouth at bedtime.   metoprolol tartrate 25 MG tablet Commonly known as:  LOPRESSOR Take 25 mg by mouth 2  (two) times daily.   mirtazapine 15 MG tablet Commonly known as:  REMERON Take 15 mg by mouth at bedtime.   montelukast 10 MG tablet Commonly known as:  SINGULAIR Take 10 mg by mouth daily.   mupirocin ointment 2 % Commonly known as:  BACTROBAN Apply to right side of neck three times daily for seven to ten days as directed.   neomycin-polymyxin-dexameth 0.1 % Oint Commonly known as:  MAXITROL Place 1 application into both eyes at bedtime.   NOVOLOG FLEXPEN 100 UNIT/ML FlexPen Generic drug:  insulin aspart   nystatin 100000 UNIT/ML suspension Commonly known as:  MYCOSTATIN Take 10 mLs by mouth 4 (four) times daily.   OCUSOFT LID SCRUB Pads Apply 1 each topically 2 (two) times daily.   omeprazole 20 MG capsule Commonly known as:  PRILOSEC Take 1 capsule (20 mg total) by mouth 2 (two) times daily before a meal. 30 min prior to meals   ondansetron 4 MG tablet Commonly known as:  ZOFRAN Take 4 mg by mouth every 6 (six) hours as needed for nausea or vomiting.   OYSTER SHELL CALCIUM PO Take 500 mg by mouth daily.   phenol 1.4 % Liqd Commonly known as:  CHLORASEPTIC Use as directed 2 sprays in the mouth or throat every 4 (four) hours as needed for throat irritation / pain.   potassium chloride SA 20 MEQ tablet Commonly known as:  K-DUR,KLOR-CON Take 1 tablet (20 mEq total) by mouth daily.   pravastatin 20 MG tablet Commonly known as:  PRAVACHOL Take 20 mg by mouth at bedtime.   ranitidine 150 MG tablet Commonly known as:  ZANTAC Take 150 mg by mouth 2 (two) times daily.   Rivaroxaban 15 MG Tabs tablet Commonly known as:  XARELTO Take 15 mg by mouth daily with supper.   simethicone 80 MG chewable tablet Commonly known as:  MYLICON Chew 80 mg by mouth 3 (three) times daily.   sodium chloride 0.65 % Soln nasal spray Commonly known as:  OCEAN Place 1 spray into both nostrils as needed for congestion.   sucralfate 1 g tablet Commonly known as:  CARAFATE Take 1  g by mouth 4 (four) times daily.   traMADol 50 MG tablet Commonly known as:  ULTRAM Take 50 mg by mouth every 12 (twelve) hours as needed for moderate pain.   triamcinolone cream 0.1 % Commonly known as:  KENALOG Apply 1 application topically 3 (three) times daily.   valsartan 160 MG tablet Commonly known as:  DIOVAN Take 160 mg by mouth daily.   Vitamin D-3 1000 units Caps Take 1,000 Units by mouth daily.   white petrolatum Gel Commonly known as:  VASELINE Apply 1 application topically at bedtime. To both eyes for dry eyes       Past Medical History:  Diagnosis Date  . Atrial fibrillation ([place])   . Blindness   . Cancer ([place])    breast  . Carotid artery occlusion   . Dementia   . Diabetes mellitus   . GERD (gastroesophageal reflux disease)   . Hyperlipidemia   . Hypertension   . Hypothyroidism   . Stroke [facility]) 2009 and  2011   difficulty with swallowing    Past Surgical History:  Procedure Laterality Date  . ABDOMINAL HYSTERECTOMY    . Arch Aortogram  03-15-2010  . MASTECTOMY, PARTIAL Right   . ROTATOR CUFF REPAIR Left 1981    No Known Allergies  Review of systems negative except as noted in HPI / PMHx or noted below:  Review of Systems  Constitutional: Negative.   HENT: Negative.   Eyes: Negative.   Respiratory: Negative.   Cardiovascular: Negative.   Gastrointestinal: Negative.   Genitourinary: Negative.   Musculoskeletal: Negative.   Skin: Negative.   Neurological: Negative.   Endo/Heme/Allergies: Negative.   Psychiatric/Behavioral: Negative.      Objective:   Vitals:   06/14/16 1023  BP: 108/70  Pulse: 76  Resp: (!) 22          Physical Exam  Constitutional: She is well-developed, well-nourished, and in no distress.  HENT:  Head: Normocephalic.  Right Ear: Tympanic membrane, external ear and ear canal normal.  Left Ear: Tympanic membrane, external ear and ear canal normal.  Nose: Nose normal. No mucosal edema or rhinorrhea.    Mouth/Throat: Uvula is midline, oropharynx is clear and moist and mucous membranes are normal. No oropharyngeal exudate.  Eyes: Conjunctivae are normal.  Neck: Trachea normal. No tracheal tenderness present. No tracheal deviation present. No thyromegaly present.  Cardiovascular: Normal rate, regular rhythm, S1 normal, S2 normal and normal heart sounds.   No murmur heard. Pulmonary/Chest: Breath sounds normal. No stridor. No respiratory distress. She has no wheezes. She has no rales.  Lymphadenopathy:       Head (right side): No tonsillar adenopathy present.       Head (left side): No tonsillar adenopathy present.    She has no cervical adenopathy.  Neurological: She is alert. Gait normal.  Skin: No rash noted. She is not diaphoretic. No erythema. Nails show no clubbing.  Psychiatric: Mood and affect normal.    Diagnostics: Results of blood tests forwarded by assisted living facility dated 06/01/2016 identified a white blood cell count of 5.9 with a normal differential, hemoglobin 11.5 with an MCV of 91.5 and a platelet count of 133. She had a negative antimitochondrial antibody.  Assessment and Plan:   1. Pruritic disorder     1. Obtain new blood test - Celiac screen with IgA  2. Use a combination of the following medications every day:   A. Cetirizine 10mg  one tablet two times per day  B. Ranitidine 150 one tablet two times per day   C. Montelukast 10mg  one tablet one time per day  3. Apply Mometasone ointment 0.1% to skin after bath or shower one time per day  4. Eliminate Citrucel, Vitamin D, Dapsone, calcium, all steroid creams, pravachol, guaifenesin, Gas-x   5. Return to clinic in 4 weeks  Joanna Hill still has significant problems with pruritus but fortunately her skin looks tremendously better and she appears to have resolved all of her excoriated nodules with her current plan. I am going to keep her on anti-inflammatory medications by having her use topical mometasone just  about every day after her shower or bath  and I'm going to start to eliminate medications as noted above assuming that there is a adverse effect directed against one of these medications giving rise to her pruritus. I do not know why she is on dapsone but certainly the administration of this medication for her dermatitis does not appear to be helped her very much and we will eliminate this medication. To be complete we'll look at dermatitis herpetiformis as a possible cause of her pruritus. I'll see her back in this clinic in 4 weeks or earlier if there is a problem.  Joanna Katz, MD Joanna Hill

## 2016-06-15 DIAGNOSIS — J309 Allergic rhinitis, unspecified: Secondary | ICD-10-CM | POA: Diagnosis not present

## 2016-06-15 DIAGNOSIS — R2689 Other abnormalities of gait and mobility: Secondary | ICD-10-CM | POA: Diagnosis not present

## 2016-06-15 DIAGNOSIS — R1311 Dysphagia, oral phase: Secondary | ICD-10-CM | POA: Diagnosis not present

## 2016-06-15 DIAGNOSIS — R296 Repeated falls: Secondary | ICD-10-CM | POA: Diagnosis not present

## 2016-06-16 DIAGNOSIS — R2689 Other abnormalities of gait and mobility: Secondary | ICD-10-CM | POA: Diagnosis not present

## 2016-06-16 DIAGNOSIS — R1311 Dysphagia, oral phase: Secondary | ICD-10-CM | POA: Diagnosis not present

## 2016-06-16 DIAGNOSIS — R296 Repeated falls: Secondary | ICD-10-CM | POA: Diagnosis not present

## 2016-06-17 DIAGNOSIS — R296 Repeated falls: Secondary | ICD-10-CM | POA: Diagnosis not present

## 2016-06-17 DIAGNOSIS — R2689 Other abnormalities of gait and mobility: Secondary | ICD-10-CM | POA: Diagnosis not present

## 2016-06-17 DIAGNOSIS — R1311 Dysphagia, oral phase: Secondary | ICD-10-CM | POA: Diagnosis not present

## 2016-06-20 DIAGNOSIS — R2689 Other abnormalities of gait and mobility: Secondary | ICD-10-CM | POA: Diagnosis not present

## 2016-06-20 DIAGNOSIS — R296 Repeated falls: Secondary | ICD-10-CM | POA: Diagnosis not present

## 2016-06-20 DIAGNOSIS — E119 Type 2 diabetes mellitus without complications: Secondary | ICD-10-CM | POA: Diagnosis not present

## 2016-06-20 DIAGNOSIS — R1311 Dysphagia, oral phase: Secondary | ICD-10-CM | POA: Diagnosis not present

## 2016-06-21 DIAGNOSIS — R1311 Dysphagia, oral phase: Secondary | ICD-10-CM | POA: Diagnosis not present

## 2016-06-21 DIAGNOSIS — R296 Repeated falls: Secondary | ICD-10-CM | POA: Diagnosis not present

## 2016-06-21 DIAGNOSIS — R2689 Other abnormalities of gait and mobility: Secondary | ICD-10-CM | POA: Diagnosis not present

## 2016-06-22 DIAGNOSIS — R2689 Other abnormalities of gait and mobility: Secondary | ICD-10-CM | POA: Diagnosis not present

## 2016-06-22 DIAGNOSIS — R1311 Dysphagia, oral phase: Secondary | ICD-10-CM | POA: Diagnosis not present

## 2016-06-22 DIAGNOSIS — R296 Repeated falls: Secondary | ICD-10-CM | POA: Diagnosis not present

## 2016-06-23 DIAGNOSIS — I10 Essential (primary) hypertension: Secondary | ICD-10-CM | POA: Diagnosis not present

## 2016-06-23 DIAGNOSIS — R296 Repeated falls: Secondary | ICD-10-CM | POA: Diagnosis not present

## 2016-06-23 DIAGNOSIS — N183 Chronic kidney disease, stage 3 (moderate): Secondary | ICD-10-CM | POA: Diagnosis not present

## 2016-06-23 DIAGNOSIS — F0391 Unspecified dementia with behavioral disturbance: Secondary | ICD-10-CM | POA: Diagnosis not present

## 2016-06-23 DIAGNOSIS — E119 Type 2 diabetes mellitus without complications: Secondary | ICD-10-CM | POA: Diagnosis not present

## 2016-06-23 DIAGNOSIS — R2689 Other abnormalities of gait and mobility: Secondary | ICD-10-CM | POA: Diagnosis not present

## 2016-06-23 DIAGNOSIS — R1311 Dysphagia, oral phase: Secondary | ICD-10-CM | POA: Diagnosis not present

## 2016-06-24 DIAGNOSIS — R1311 Dysphagia, oral phase: Secondary | ICD-10-CM | POA: Diagnosis not present

## 2016-06-24 DIAGNOSIS — R2689 Other abnormalities of gait and mobility: Secondary | ICD-10-CM | POA: Diagnosis not present

## 2016-06-24 DIAGNOSIS — R296 Repeated falls: Secondary | ICD-10-CM | POA: Diagnosis not present

## 2016-06-27 DIAGNOSIS — R2689 Other abnormalities of gait and mobility: Secondary | ICD-10-CM | POA: Diagnosis not present

## 2016-06-27 DIAGNOSIS — R1311 Dysphagia, oral phase: Secondary | ICD-10-CM | POA: Diagnosis not present

## 2016-06-27 DIAGNOSIS — R296 Repeated falls: Secondary | ICD-10-CM | POA: Diagnosis not present

## 2016-06-28 DIAGNOSIS — E119 Type 2 diabetes mellitus without complications: Secondary | ICD-10-CM | POA: Diagnosis not present

## 2016-06-28 DIAGNOSIS — R197 Diarrhea, unspecified: Secondary | ICD-10-CM | POA: Diagnosis not present

## 2016-06-28 DIAGNOSIS — N183 Chronic kidney disease, stage 3 (moderate): Secondary | ICD-10-CM | POA: Diagnosis not present

## 2016-06-28 DIAGNOSIS — R2689 Other abnormalities of gait and mobility: Secondary | ICD-10-CM | POA: Diagnosis not present

## 2016-06-28 DIAGNOSIS — R634 Abnormal weight loss: Secondary | ICD-10-CM | POA: Diagnosis not present

## 2016-06-28 DIAGNOSIS — R1311 Dysphagia, oral phase: Secondary | ICD-10-CM | POA: Diagnosis not present

## 2016-06-28 DIAGNOSIS — R296 Repeated falls: Secondary | ICD-10-CM | POA: Diagnosis not present

## 2016-06-29 DIAGNOSIS — R2689 Other abnormalities of gait and mobility: Secondary | ICD-10-CM | POA: Diagnosis not present

## 2016-06-29 DIAGNOSIS — R1311 Dysphagia, oral phase: Secondary | ICD-10-CM | POA: Diagnosis not present

## 2016-06-29 DIAGNOSIS — R296 Repeated falls: Secondary | ICD-10-CM | POA: Diagnosis not present

## 2016-07-08 DIAGNOSIS — R634 Abnormal weight loss: Secondary | ICD-10-CM | POA: Diagnosis not present

## 2016-07-08 DIAGNOSIS — F0391 Unspecified dementia with behavioral disturbance: Secondary | ICD-10-CM | POA: Diagnosis not present

## 2016-07-08 DIAGNOSIS — R21 Rash and other nonspecific skin eruption: Secondary | ICD-10-CM | POA: Diagnosis not present

## 2016-07-08 DIAGNOSIS — N183 Chronic kidney disease, stage 3 (moderate): Secondary | ICD-10-CM | POA: Diagnosis not present

## 2016-07-12 ENCOUNTER — Ambulatory Visit (INDEPENDENT_AMBULATORY_CARE_PROVIDER_SITE_OTHER): Payer: Medicare Other | Admitting: Allergy and Immunology

## 2016-07-12 ENCOUNTER — Encounter: Payer: Self-pay | Admitting: Allergy and Immunology

## 2016-07-12 VITALS — BP 104/58 | HR 68 | Resp 20

## 2016-07-12 DIAGNOSIS — L299 Pruritus, unspecified: Secondary | ICD-10-CM | POA: Diagnosis not present

## 2016-07-12 MED ORDER — CYPROHEPTADINE HCL 4 MG PO TABS: ORAL_TABLET | ORAL | 5 refills | Status: AC

## 2016-07-12 NOTE — Progress Notes (Signed)
Follow-up Note  Referring Provider: Reynold Bowen, MD Primary Provider: Sheela Stack, MD Date of Office Visit: 07/12/2016  Subjective:   Joanna Hill (DOB: 1931-11-30) is a 82 y.o. female who returns to the [facility] [place] on 07/12/2016 in re-evaluation of the following:  HPI: Joanna Hill returns to this clinic in reevaluation of her pruritic disorder. She still continues to remain somewhat itchy. She may be a little bit better on her most current medical therapy but still has a significant issue with itchiness of her trunk and her arms and her scalp. It does not sound as though she is receiving topical mometasone every day but may be just a few times a week. It does sound as though she's been using her medical therapy prescribed from this clinic during her last visit of October 2017 on a regular basis.    Medication List      acetaminophen 325 MG tablet Commonly known as:  TYLENOL Take 650 mg by mouth every 6 (six) hours as needed for mild pain, moderate pain, fever or headache.   amLODipine 10 MG tablet Commonly known as:  NORVASC Take 10 mg by mouth daily.   antiseptic oral rinse Liqd 5 mLs by Mouth Rinse route 2 (two) times daily.   ARTIFICIAL TEARS OP Apply to eye.   Carboxymethylcellulose Sodium 0.25 % Soln Apply 1 drop to eye 4 (four) times daily.   cetirizine 10 MG tablet Commonly known as:  ZYRTEC Take 1 tablet (10 mg total) by mouth 2 (two) times daily.   diphenhydrAMINE 25 mg capsule Commonly known as:  BENADRYL Take 25 mg by mouth every 6 (six) hours as needed.   docusate sodium 100 MG capsule Commonly known as:  COLACE Take 100 mg by mouth 2 (two) times daily.   donepezil 10 MG tablet Commonly known as:  ARICEPT Take 10 mg by mouth at bedtime.   fluticasone 50 MCG/ACT nasal spray Commonly known as:  FLONASE Place 1 spray into both nostrils 2 (two) times daily.   furosemide 20 MG tablet Commonly known as:  LASIX Take 40 mg by mouth 2  (two) times daily.   gabapentin 300 MG capsule Commonly known as:  NEURONTIN Take 300 mg by mouth 2 (two) times daily.   insulin detemir 100 UNIT/ML injection Commonly known as:  LEVEMIR Inject 25-40 Units into the skin 2 (two) times daily. Use 40 units every morning and use 25 units every evening   insulin lispro 100 UNIT/ML injection Commonly known as:  HUMALOG Inject 7 Units into the skin daily with breakfast.   ipratropium-albuterol 0.5-2.5 (3) MG/3ML Soln Commonly known as:  DUONEB Take 3 mLs by nebulization daily as needed (shortness of breath/wheezing).   ketotifen 0.025 % ophthalmic solution Commonly known as:  ZADITOR Place 1 drop into both eyes 2 (two) times daily.   levothyroxine 75 MCG tablet Commonly known as:  SYNTHROID, LEVOTHROID Take 75 mcg by mouth daily before breakfast.   LORazepam 0.5 MG tablet Commonly known as:  ATIVAN   LUVOX CR 150 MG Cp24 Generic drug:  Fluvoxamine Maleate Take 150 mg by mouth at bedtime.   metoprolol tartrate 25 MG tablet Commonly known as:  LOPRESSOR Take 25 mg by mouth 2 (two) times daily.   mirtazapine 15 MG tablet Commonly known as:  REMERON Take 15 mg by mouth at bedtime.   mometasone 0.1 % ointment Commonly known as:  ELOCON Apply to skin once daily as directed.   montelukast 10  MG tablet Commonly known as:  SINGULAIR Take 10 mg by mouth daily.   neomycin-polymyxin-dexameth 0.1 % Oint Commonly known as:  MAXITROL Place 1 application into both eyes at bedtime.   NOVOLOG FLEXPEN 100 UNIT/ML FlexPen Generic drug:  insulin aspart   nystatin 100000 UNIT/ML suspension Commonly known as:  MYCOSTATIN Take 10 mLs by mouth 4 (four) times daily.   OCUSOFT LID SCRUB Pads Apply 1 each topically 2 (two) times daily.   omeprazole 20 MG capsule Commonly known as:  PRILOSEC Take 1 capsule (20 mg total) by mouth 2 (two) times daily before a meal. 30 min prior to meals   potassium chloride SA 20 MEQ tablet Commonly  known as:  K-DUR,KLOR-CON Take 1 tablet (20 mEq total) by mouth daily.   pravastatin 20 MG tablet Commonly known as:  PRAVACHOL Take 20 mg by mouth at bedtime.   ranitidine 150 MG tablet Commonly known as:  ZANTAC Take 150 mg by mouth 2 (two) times daily.   Rivaroxaban 15 MG Tabs tablet Commonly known as:  XARELTO Take 15 mg by mouth daily with supper.   sucralfate 1 g tablet Commonly known as:  CARAFATE Take 1 g by mouth 4 (four) times daily.   valsartan 160 MG tablet Commonly known as:  DIOVAN Take 160 mg by mouth daily.       Past Medical History:  Diagnosis Date  . Atrial fibrillation ([place])   . Blindness   . Cancer ([place])    breast  . Carotid artery occlusion   . Dementia   . Diabetes mellitus   . GERD (gastroesophageal reflux disease)   . Hyperlipidemia   . Hypertension   . Hypothyroidism   . Stroke [facility]) 2009 and  2011   difficulty with swallowing    Past Surgical History:  Procedure Laterality Date  . ABDOMINAL HYSTERECTOMY    . Arch Aortogram  03-15-2010  . MASTECTOMY, PARTIAL Right   . ROTATOR CUFF REPAIR Left 1981    No Known Allergies  Review of systems negative except as noted in HPI / PMHx or noted below:  Review of Systems  Constitutional: Negative.   HENT: Negative.   Eyes: Negative.   Respiratory: Negative.   Cardiovascular: Negative.   Gastrointestinal: Negative.   Genitourinary: Negative.   Musculoskeletal: Negative.   Skin: Negative.   Neurological: Negative.   Endo/Heme/Allergies: Negative.   Psychiatric/Behavioral: Negative.      Objective:   Vitals:   07/12/16 1550  BP: (!) 104/58  Pulse: 68  Resp: 20          Physical Exam  Constitutional: She is well-developed, well-nourished, and in no distress.  HENT:  Head: Normocephalic.  Right Ear: Tympanic membrane, external ear and ear canal normal.  Left Ear: Tympanic membrane, external ear and ear canal normal.  Nose: Nose normal. No mucosal edema or rhinorrhea.    Mouth/Throat: Uvula is midline, oropharynx is clear and moist and mucous membranes are normal. No oropharyngeal exudate.  Eyes: Conjunctivae are normal.  Neck: Trachea normal. No tracheal tenderness present. No tracheal deviation present. No thyromegaly present.  Cardiovascular: Normal rate, regular rhythm, S1 normal, S2 normal and normal heart sounds.   No murmur heard. Pulmonary/Chest: Breath sounds normal. No stridor. No respiratory distress. She has no wheezes. She has no rales.  Musculoskeletal: She exhibits no edema.  Lymphadenopathy:       Head (right side): No tonsillar adenopathy present.       Head (left side): No tonsillar adenopathy  present.    She has no cervical adenopathy.  Neurological: She is alert. Gait normal.  Skin: Rash (Slight scale involving arms and trunk. No obvious dermatitis otherwise) noted. She is not diaphoretic. No erythema. Nails show no clubbing.  Psychiatric: Mood and affect normal.    Diagnostics:    Assessment and Plan:   1. Pruritic disorder     1. Discontinue omeprazole. Can add OTC Tums if needed  2. Use a combination of the following medications every day:   A. Cetirizine 10mg  one tablet two times per day  B. Ranitidine 150 one tablet two times per day   C. discontinue montelukast  D. start Periactin 4 mg tablet twice a day  3. Apply Mometasone ointment 0.1% to skin after bath or shower one time per day  4. Return to clinic in 4 weeks  For Mazzy's next step in attempting to address her pruritic disorder we'll have her discontinue her omeprazole and add Periactin. I suspect that she'll do relatively well discontinuing her omeprazole given the fact that she is now on ranitidine twice a day. She has the option of adding an over-the-counter Tums if needed. We'll see how this goes over the next 4 weeks with this step and then make a decision about how to proceed pending her response.  Allena Katz, MD [place]

## 2016-07-12 NOTE — Patient Instructions (Addendum)
  1. Discontinue omeprazole. Can add OTC Tums if needed  2. Use a combination of the following medications every day:   A. Cetirizine 10mg  one tablet two times per day  B. Ranitidine 150 one tablet two times per day   C. discontinue montelukast  D. start Periactin 4 mg tablet twice a day  3. Apply Mometasone ointment 0.1% to skin after bath or shower one time per day  4. Return to clinic in 4 weeks

## 2016-07-14 DIAGNOSIS — F429 Obsessive-compulsive disorder, unspecified: Secondary | ICD-10-CM | POA: Diagnosis not present

## 2016-07-14 DIAGNOSIS — F419 Anxiety disorder, unspecified: Secondary | ICD-10-CM | POA: Diagnosis not present

## 2016-07-15 DIAGNOSIS — N183 Chronic kidney disease, stage 3 (moderate): Secondary | ICD-10-CM | POA: Diagnosis not present

## 2016-07-15 DIAGNOSIS — R350 Frequency of micturition: Secondary | ICD-10-CM | POA: Diagnosis not present

## 2016-07-15 DIAGNOSIS — E119 Type 2 diabetes mellitus without complications: Secondary | ICD-10-CM | POA: Diagnosis not present

## 2016-07-15 DIAGNOSIS — F0391 Unspecified dementia with behavioral disturbance: Secondary | ICD-10-CM | POA: Diagnosis not present

## 2016-07-18 DIAGNOSIS — R319 Hematuria, unspecified: Secondary | ICD-10-CM | POA: Diagnosis not present

## 2016-07-18 DIAGNOSIS — N390 Urinary tract infection, site not specified: Secondary | ICD-10-CM | POA: Diagnosis not present

## 2016-07-18 DIAGNOSIS — Z79899 Other long term (current) drug therapy: Secondary | ICD-10-CM | POA: Diagnosis not present

## 2016-07-22 DIAGNOSIS — F322 Major depressive disorder, single episode, severe without psychotic features: Secondary | ICD-10-CM | POA: Diagnosis not present

## 2016-07-22 DIAGNOSIS — N183 Chronic kidney disease, stage 3 (moderate): Secondary | ICD-10-CM | POA: Diagnosis not present

## 2016-07-22 DIAGNOSIS — I4891 Unspecified atrial fibrillation: Secondary | ICD-10-CM | POA: Diagnosis not present

## 2016-07-22 DIAGNOSIS — F0391 Unspecified dementia with behavioral disturbance: Secondary | ICD-10-CM | POA: Diagnosis not present

## 2016-07-29 DIAGNOSIS — E038 Other specified hypothyroidism: Secondary | ICD-10-CM | POA: Diagnosis not present

## 2016-07-29 DIAGNOSIS — I638 Other cerebral infarction: Secondary | ICD-10-CM | POA: Diagnosis not present

## 2016-07-29 DIAGNOSIS — D6489 Other specified anemias: Secondary | ICD-10-CM | POA: Diagnosis not present

## 2016-07-29 DIAGNOSIS — F329 Major depressive disorder, single episode, unspecified: Secondary | ICD-10-CM | POA: Diagnosis not present

## 2016-07-29 DIAGNOSIS — C50919 Malignant neoplasm of unspecified site of unspecified female breast: Secondary | ICD-10-CM | POA: Diagnosis not present

## 2016-07-29 DIAGNOSIS — R600 Localized edema: Secondary | ICD-10-CM | POA: Diagnosis not present

## 2016-07-29 DIAGNOSIS — I10 Essential (primary) hypertension: Secondary | ICD-10-CM | POA: Diagnosis not present

## 2016-07-29 DIAGNOSIS — N182 Chronic kidney disease, stage 2 (mild): Secondary | ICD-10-CM | POA: Diagnosis not present

## 2016-07-29 DIAGNOSIS — E1129 Type 2 diabetes mellitus with other diabetic kidney complication: Secondary | ICD-10-CM | POA: Diagnosis not present

## 2016-07-29 DIAGNOSIS — I480 Paroxysmal atrial fibrillation: Secondary | ICD-10-CM | POA: Diagnosis not present

## 2016-07-29 DIAGNOSIS — E784 Other hyperlipidemia: Secondary | ICD-10-CM | POA: Diagnosis not present

## 2016-07-29 DIAGNOSIS — I2510 Atherosclerotic heart disease of native coronary artery without angina pectoris: Secondary | ICD-10-CM | POA: Diagnosis not present

## 2016-08-05 DIAGNOSIS — I10 Essential (primary) hypertension: Secondary | ICD-10-CM | POA: Diagnosis not present

## 2016-08-05 DIAGNOSIS — E119 Type 2 diabetes mellitus without complications: Secondary | ICD-10-CM | POA: Diagnosis not present

## 2016-08-05 DIAGNOSIS — J069 Acute upper respiratory infection, unspecified: Secondary | ICD-10-CM | POA: Diagnosis not present

## 2016-08-05 DIAGNOSIS — N183 Chronic kidney disease, stage 3 (moderate): Secondary | ICD-10-CM | POA: Diagnosis not present

## 2016-08-15 DIAGNOSIS — S8990XA Unspecified injury of unspecified lower leg, initial encounter: Secondary | ICD-10-CM

## 2016-08-15 HISTORY — DX: Unspecified injury of unspecified lower leg, initial encounter: S89.90XA

## 2016-08-16 DIAGNOSIS — F429 Obsessive-compulsive disorder, unspecified: Secondary | ICD-10-CM | POA: Diagnosis not present

## 2016-08-16 DIAGNOSIS — F419 Anxiety disorder, unspecified: Secondary | ICD-10-CM | POA: Diagnosis not present

## 2016-08-18 DIAGNOSIS — D649 Anemia, unspecified: Secondary | ICD-10-CM | POA: Diagnosis not present

## 2016-08-18 DIAGNOSIS — I10 Essential (primary) hypertension: Secondary | ICD-10-CM | POA: Diagnosis not present

## 2016-08-18 DIAGNOSIS — E119 Type 2 diabetes mellitus without complications: Secondary | ICD-10-CM | POA: Diagnosis not present

## 2016-08-18 DIAGNOSIS — E785 Hyperlipidemia, unspecified: Secondary | ICD-10-CM | POA: Diagnosis not present

## 2016-08-18 DIAGNOSIS — K769 Liver disease, unspecified: Secondary | ICD-10-CM | POA: Diagnosis not present

## 2016-08-24 DIAGNOSIS — H401114 Primary open-angle glaucoma, right eye, indeterminate stage: Secondary | ICD-10-CM | POA: Diagnosis not present

## 2016-08-24 DIAGNOSIS — H401123 Primary open-angle glaucoma, left eye, severe stage: Secondary | ICD-10-CM | POA: Diagnosis not present

## 2016-08-24 DIAGNOSIS — H40043 Steroid responder, bilateral: Secondary | ICD-10-CM | POA: Diagnosis not present

## 2016-08-31 ENCOUNTER — Ambulatory Visit: Payer: Medicare Other | Admitting: Allergy and Immunology

## 2016-09-05 DIAGNOSIS — E119 Type 2 diabetes mellitus without complications: Secondary | ICD-10-CM | POA: Diagnosis not present

## 2016-09-05 DIAGNOSIS — R509 Fever, unspecified: Secondary | ICD-10-CM | POA: Diagnosis not present

## 2016-09-05 DIAGNOSIS — I10 Essential (primary) hypertension: Secondary | ICD-10-CM | POA: Diagnosis not present

## 2016-09-05 DIAGNOSIS — R05 Cough: Secondary | ICD-10-CM | POA: Diagnosis not present

## 2016-09-05 DIAGNOSIS — D649 Anemia, unspecified: Secondary | ICD-10-CM | POA: Diagnosis not present

## 2016-09-06 ENCOUNTER — Ambulatory Visit: Payer: Medicare Other | Admitting: Allergy and Immunology

## 2016-09-07 DIAGNOSIS — J111 Influenza due to unidentified influenza virus with other respiratory manifestations: Secondary | ICD-10-CM | POA: Diagnosis not present

## 2016-09-07 DIAGNOSIS — I10 Essential (primary) hypertension: Secondary | ICD-10-CM | POA: Diagnosis not present

## 2016-09-07 DIAGNOSIS — E119 Type 2 diabetes mellitus without complications: Secondary | ICD-10-CM | POA: Diagnosis not present

## 2016-09-07 DIAGNOSIS — N183 Chronic kidney disease, stage 3 (moderate): Secondary | ICD-10-CM | POA: Diagnosis not present

## 2016-09-16 DIAGNOSIS — W19XXXD Unspecified fall, subsequent encounter: Secondary | ICD-10-CM | POA: Diagnosis not present

## 2016-09-16 DIAGNOSIS — N183 Chronic kidney disease, stage 3 (moderate): Secondary | ICD-10-CM | POA: Diagnosis not present

## 2016-09-16 DIAGNOSIS — E119 Type 2 diabetes mellitus without complications: Secondary | ICD-10-CM | POA: Diagnosis not present

## 2016-09-16 DIAGNOSIS — I4891 Unspecified atrial fibrillation: Secondary | ICD-10-CM | POA: Diagnosis not present

## 2016-09-19 DIAGNOSIS — F419 Anxiety disorder, unspecified: Secondary | ICD-10-CM | POA: Diagnosis not present

## 2016-09-19 DIAGNOSIS — F429 Obsessive-compulsive disorder, unspecified: Secondary | ICD-10-CM | POA: Diagnosis not present

## 2016-09-21 ENCOUNTER — Ambulatory Visit (INDEPENDENT_AMBULATORY_CARE_PROVIDER_SITE_OTHER): Payer: Medicare Other | Admitting: Allergy and Immunology

## 2016-09-21 ENCOUNTER — Encounter: Payer: Self-pay | Admitting: Allergy and Immunology

## 2016-09-21 VITALS — BP 106/62 | HR 92 | Resp 22

## 2016-09-21 DIAGNOSIS — W19XXXA Unspecified fall, initial encounter: Secondary | ICD-10-CM | POA: Diagnosis not present

## 2016-09-21 DIAGNOSIS — B351 Tinea unguium: Secondary | ICD-10-CM | POA: Diagnosis not present

## 2016-09-21 DIAGNOSIS — L299 Pruritus, unspecified: Secondary | ICD-10-CM | POA: Diagnosis not present

## 2016-09-21 DIAGNOSIS — S0993XA Unspecified injury of face, initial encounter: Secondary | ICD-10-CM | POA: Diagnosis not present

## 2016-09-21 DIAGNOSIS — S0993XD Unspecified injury of face, subsequent encounter: Secondary | ICD-10-CM | POA: Diagnosis not present

## 2016-09-21 DIAGNOSIS — L603 Nail dystrophy: Secondary | ICD-10-CM | POA: Diagnosis not present

## 2016-09-21 DIAGNOSIS — E1159 Type 2 diabetes mellitus with other circulatory complications: Secondary | ICD-10-CM | POA: Diagnosis not present

## 2016-09-21 MED ORDER — LORATADINE 10 MG PO TABS: ORAL_TABLET | ORAL | 5 refills | Status: DC

## 2016-09-21 NOTE — Patient Instructions (Addendum)
  1. Use a combination of the following medications every day:   A. Cetirizine 10mg  one tablet two times per day  B. Ranitidine 150 one tablet two times per day   C. Discontinue Periactin    D. Start loratadine 10 mg one tablet 2 times per day  2. Discontinue Mometasone ointment    3. Have medical provider on staff evaluate fall damage  4. Return to clinic in 12 weeks

## 2016-09-21 NOTE — Progress Notes (Signed)
Follow-up Note  Referring Provider: Reynold Bowen, MD Primary Provider: Sheela Stack, MD Date of Office Visit: 09/21/2016  Subjective:   Joanna Hill (DOB: [birth year]) is a 83 y.o. female who returns to the [facility] [place] on 09/21/2016 in re-evaluation of the following:  HPI: Lexa returns to this clinic in reevaluation of her pruritic disorder. I last saw her in this clinic 07/12/2016 at which time I gave her a topical anti-inflammatory agent for her skin and added Periactin and eliminated her omeprazole with consideration that this may have been a trigger for her immunological hyperreactivity.  Unfortunately none of this manipulation has really helped her very much and she still intensely itchy.  As well, one week ago she developed a fall and appears to have created some degree of damage on her face. It does not sound as though a medical provider had evaluated her since that event.  Allergies as of 09/21/2016   No Known Allergies     Medication List      acetaminophen 325 MG tablet Commonly known as:  TYLENOL Take 650 mg by mouth every 6 (six) hours as needed for mild pain, moderate pain, fever or headache.   amLODipine 10 MG tablet Commonly known as:  NORVASC Take 10 mg by mouth daily.   antiseptic oral rinse Liqd 5 mLs by Mouth Rinse route 2 (two) times daily.   ARTIFICIAL TEARS OP Apply to eye.   Carboxymethylcellulose Sodium 0.25 % Soln Apply 1 drop to eye 4 (four) times daily.   cetirizine 10 MG tablet Commonly known as:  ZYRTEC Take 1 tablet (10 mg total) by mouth 2 (two) times daily.   cyproheptadine 4 MG tablet Commonly known as:  PERIACTIN Take one tablet twice daily   diphenhydrAMINE 25 mg capsule Commonly known as:  BENADRYL Take 25 mg by mouth every 6 (six) hours as needed.   docusate sodium 100 MG capsule Commonly known as:  COLACE Take 100 mg by mouth 2 (two) times daily.   donepezil 10 MG tablet Commonly known as:   ARICEPT Take 10 mg by mouth at bedtime.   fluticasone 50 MCG/ACT nasal spray Commonly known as:  FLONASE Place 1 spray into both nostrils 2 (two) times daily.   furosemide 20 MG tablet Commonly known as:  LASIX Take 40 mg by mouth 2 (two) times daily.   gabapentin 300 MG capsule Commonly known as:  NEURONTIN Take 300 mg by mouth 2 (two) times daily.   insulin detemir 100 UNIT/ML injection Commonly known as:  LEVEMIR Inject 25-40 Units into the skin 2 (two) times daily. Use 40 units every morning and use 25 units every evening   insulin lispro 100 UNIT/ML injection Commonly known as:  HUMALOG Inject 7 Units into the skin daily with breakfast.   ipratropium-albuterol 0.5-2.5 (3) MG/3ML Soln Commonly known as:  DUONEB Take 3 mLs by nebulization daily as needed (shortness of breath/wheezing).   ketotifen 0.025 % ophthalmic solution Commonly known as:  ZADITOR Place 1 drop into both eyes 2 (two) times daily.   levothyroxine 75 MCG tablet Commonly known as:  SYNTHROID, LEVOTHROID Take 75 mcg by mouth daily before breakfast.   LORazepam 0.5 MG tablet Commonly known as:  ATIVAN   LUVOX CR 150 MG Cp24 Generic drug:  Fluvoxamine Maleate Take 150 mg by mouth at bedtime.   metoprolol tartrate 25 MG tablet Commonly known as:  LOPRESSOR Take 25 mg by mouth 2 (two) times daily.   mirtazapine  15 MG tablet Commonly known as:  REMERON Take 15 mg by mouth at bedtime.   mometasone 0.1 % ointment Commonly known as:  ELOCON Apply to skin once daily as directed.   montelukast 10 MG tablet Commonly known as:  SINGULAIR Take 10 mg by mouth daily.   neomycin-polymyxin-dexameth 0.1 % Oint Commonly known as:  MAXITROL Place 1 application into both eyes at bedtime.   NOVOLOG FLEXPEN 100 UNIT/ML FlexPen Generic drug:  insulin aspart   nystatin 100000 UNIT/ML suspension Commonly known as:  MYCOSTATIN Take 10 mLs by mouth 4 (four) times daily.   OCUSOFT LID SCRUB Pads Apply 1  each topically 2 (two) times daily.   omeprazole 20 MG capsule Commonly known as:  PRILOSEC Take 1 capsule (20 mg total) by mouth 2 (two) times daily before a meal. 30 min prior to meals   potassium chloride SA 20 MEQ tablet Commonly known as:  K-DUR,KLOR-CON Take 1 tablet (20 mEq total) by mouth daily.   pravastatin 20 MG tablet Commonly known as:  PRAVACHOL Take 20 mg by mouth at bedtime.   ranitidine 150 MG tablet Commonly known as:  ZANTAC Take 150 mg by mouth 2 (two) times daily.   Rivaroxaban 15 MG Tabs tablet Commonly known as:  XARELTO Take 15 mg by mouth daily with supper.   sucralfate 1 g tablet Commonly known as:  CARAFATE Take 1 g by mouth 4 (four) times daily.   valsartan 160 MG tablet Commonly known as:  DIOVAN Take 160 mg by mouth daily.       Past Medical History:  Diagnosis Date  . Atrial fibrillation ([place])   . Blindness   . Cancer ([place])    breast  . Carotid artery occlusion   . Dementia   . Diabetes mellitus   . GERD (gastroesophageal reflux disease)   . Hyperlipidemia   . Hypertension   . Hypothyroidism   . Stroke [facility]) 2009 and  2011   difficulty with swallowing    Past Surgical History:  Procedure Laterality Date  . ABDOMINAL HYSTERECTOMY    . Arch Aortogram  03-15-2010  . MASTECTOMY, PARTIAL Right   . ROTATOR CUFF REPAIR Left 1981    Review of systems negative except as noted in HPI / PMHx or noted below:  Review of Systems  Constitutional: Negative.   HENT: Negative.   Eyes: Negative.   Respiratory: Negative.   Cardiovascular: Negative.   Gastrointestinal: Negative.   Genitourinary: Negative.   Musculoskeletal: Negative.   Skin: Negative.   Neurological: Negative.   Endo/Heme/Allergies: Negative.   Psychiatric/Behavioral: Negative.      Objective:   Vitals:   09/21/16 1130  BP: 106/62  Pulse: 92  Resp: (!) 22          Physical Exam  Constitutional: She is well-developed, well-nourished, and in no distress.    Wheelchair, slightly raspy voice  HENT:  Head: Normocephalic.  Right Ear: Tympanic membrane, external ear and ear canal normal.  Left Ear: Tympanic membrane, external ear and ear canal normal.  Nose: Nose normal. No mucosal edema or rhinorrhea.  Mouth/Throat: Uvula is midline, oropharynx is clear and moist and mucous membranes are normal. No oropharyngeal exudate.  Eyes: Conjunctivae are normal.  Neck: Trachea normal. No tracheal tenderness present. No tracheal deviation present. No thyromegaly present.  Cardiovascular: Normal rate, regular rhythm, S1 normal, S2 normal and normal heart sounds.   No murmur heard. Pulmonary/Chest: Breath sounds normal. No stridor. No respiratory distress. She has no  wheezes. She has no rales.  Lymphadenopathy:       Head (right side): No tonsillar adenopathy present.       Head (left side): No tonsillar adenopathy present.    She has no cervical adenopathy.  Neurological: She is alert.  Skin: Rash noted. She is not diaphoretic. No erythema (very deeply pigmented ecchymotic areas involving bridge of nose, sides of nose, forehead, and a large indurated 5 cm diameter area affecting her central forehead.). Nails show no clubbing.  Psychiatric: Mood and affect normal.    Diagnostics: None   Assessment and Plan:   1. Pruritic disorder   2. Facial injury, subsequent encounter     1. Use a combination of the following medications every day:   A. Cetirizine 10mg  one tablet two times per day  B. Ranitidine 150 one tablet two times per day   C. Discontinue Periactin    D. Start loratadine 10 mg one tablet 2 times per day  2. Discontinue Mometasone ointment    3. Have medical provider on staff evaluate fall damage  4. Return to clinic in 12 weeks  Unfortunately there is no obvious trigger giving rise to the pruritic disorder that Marijo suffers from but I suspect that it is probably from one of her medications. All screening tests looking for a specific  etiologic factor have been negative in the past. I'm not sure that we can start to remove her chronic use medications at this point. I did make an attempt to eliminate her omeprazole but that didn't not make any difference regarding her disorder. I would suspect that this is probably a reflection of Aricept use but once again that medication would be very difficult to remove. I eliminated her topical steroid and eliminated Periactin and we'll start her on a combination of therapy as noted above and see if this makes any difference for her over the course the next several weeks. As well, I think it would be worthwhile to have a medical provider on staff at her home evaluate her damage that occurred as a result of her fall. We will pass on the suggestion to the home today.  Allena Katz, MD Allergy / Immunology [place]

## 2016-09-22 DIAGNOSIS — N183 Chronic kidney disease, stage 3 (moderate): Secondary | ICD-10-CM | POA: Diagnosis not present

## 2016-09-22 DIAGNOSIS — W19XXXD Unspecified fall, subsequent encounter: Secondary | ICD-10-CM | POA: Diagnosis not present

## 2016-09-22 DIAGNOSIS — E119 Type 2 diabetes mellitus without complications: Secondary | ICD-10-CM | POA: Diagnosis not present

## 2016-09-22 DIAGNOSIS — F0391 Unspecified dementia with behavioral disturbance: Secondary | ICD-10-CM | POA: Diagnosis not present

## 2016-09-28 DIAGNOSIS — F419 Anxiety disorder, unspecified: Secondary | ICD-10-CM | POA: Diagnosis not present

## 2016-10-04 DIAGNOSIS — F419 Anxiety disorder, unspecified: Secondary | ICD-10-CM | POA: Diagnosis not present

## 2016-10-05 DIAGNOSIS — I10 Essential (primary) hypertension: Secondary | ICD-10-CM | POA: Diagnosis not present

## 2016-10-05 DIAGNOSIS — F0390 Unspecified dementia without behavioral disturbance: Secondary | ICD-10-CM | POA: Diagnosis not present

## 2016-10-05 DIAGNOSIS — E039 Hypothyroidism, unspecified: Secondary | ICD-10-CM | POA: Diagnosis not present

## 2016-10-05 DIAGNOSIS — N182 Chronic kidney disease, stage 2 (mild): Secondary | ICD-10-CM | POA: Diagnosis not present

## 2016-10-05 DIAGNOSIS — I639 Cerebral infarction, unspecified: Secondary | ICD-10-CM | POA: Diagnosis not present

## 2016-10-07 DIAGNOSIS — J1089 Influenza due to other identified influenza virus with other manifestations: Secondary | ICD-10-CM | POA: Diagnosis not present

## 2016-10-07 DIAGNOSIS — R296 Repeated falls: Secondary | ICD-10-CM | POA: Diagnosis not present

## 2016-10-07 DIAGNOSIS — M6281 Muscle weakness (generalized): Secondary | ICD-10-CM | POA: Diagnosis not present

## 2016-10-07 DIAGNOSIS — R278 Other lack of coordination: Secondary | ICD-10-CM | POA: Diagnosis not present

## 2016-10-07 DIAGNOSIS — R1311 Dysphagia, oral phase: Secondary | ICD-10-CM | POA: Diagnosis not present

## 2016-10-09 DIAGNOSIS — N183 Chronic kidney disease, stage 3 (moderate): Secondary | ICD-10-CM | POA: Diagnosis not present

## 2016-10-09 DIAGNOSIS — F419 Anxiety disorder, unspecified: Secondary | ICD-10-CM | POA: Diagnosis not present

## 2016-10-09 DIAGNOSIS — F322 Major depressive disorder, single episode, severe without psychotic features: Secondary | ICD-10-CM | POA: Diagnosis not present

## 2016-10-09 DIAGNOSIS — F0391 Unspecified dementia with behavioral disturbance: Secondary | ICD-10-CM | POA: Diagnosis not present

## 2016-10-10 DIAGNOSIS — M6281 Muscle weakness (generalized): Secondary | ICD-10-CM | POA: Diagnosis not present

## 2016-10-10 DIAGNOSIS — E119 Type 2 diabetes mellitus without complications: Secondary | ICD-10-CM | POA: Diagnosis not present

## 2016-10-10 DIAGNOSIS — R509 Fever, unspecified: Secondary | ICD-10-CM | POA: Diagnosis not present

## 2016-10-10 DIAGNOSIS — D649 Anemia, unspecified: Secondary | ICD-10-CM | POA: Diagnosis not present

## 2016-10-10 DIAGNOSIS — R296 Repeated falls: Secondary | ICD-10-CM | POA: Diagnosis not present

## 2016-10-10 DIAGNOSIS — E039 Hypothyroidism, unspecified: Secondary | ICD-10-CM | POA: Diagnosis not present

## 2016-10-10 DIAGNOSIS — R319 Hematuria, unspecified: Secondary | ICD-10-CM | POA: Diagnosis not present

## 2016-10-10 DIAGNOSIS — N390 Urinary tract infection, site not specified: Secondary | ICD-10-CM | POA: Diagnosis not present

## 2016-10-10 DIAGNOSIS — R278 Other lack of coordination: Secondary | ICD-10-CM | POA: Diagnosis not present

## 2016-10-10 DIAGNOSIS — R1311 Dysphagia, oral phase: Secondary | ICD-10-CM | POA: Diagnosis not present

## 2016-10-10 DIAGNOSIS — J1089 Influenza due to other identified influenza virus with other manifestations: Secondary | ICD-10-CM | POA: Diagnosis not present

## 2016-10-11 ENCOUNTER — Other Ambulatory Visit: Payer: Self-pay | Admitting: Internal Medicine

## 2016-10-11 DIAGNOSIS — F429 Obsessive-compulsive disorder, unspecified: Secondary | ICD-10-CM | POA: Diagnosis not present

## 2016-10-11 DIAGNOSIS — R296 Repeated falls: Secondary | ICD-10-CM | POA: Diagnosis not present

## 2016-10-11 DIAGNOSIS — Z1231 Encounter for screening mammogram for malignant neoplasm of breast: Secondary | ICD-10-CM

## 2016-10-11 DIAGNOSIS — R278 Other lack of coordination: Secondary | ICD-10-CM | POA: Diagnosis not present

## 2016-10-11 DIAGNOSIS — J1089 Influenza due to other identified influenza virus with other manifestations: Secondary | ICD-10-CM | POA: Diagnosis not present

## 2016-10-11 DIAGNOSIS — R1311 Dysphagia, oral phase: Secondary | ICD-10-CM | POA: Diagnosis not present

## 2016-10-11 DIAGNOSIS — F419 Anxiety disorder, unspecified: Secondary | ICD-10-CM | POA: Diagnosis not present

## 2016-10-11 DIAGNOSIS — M6281 Muscle weakness (generalized): Secondary | ICD-10-CM | POA: Diagnosis not present

## 2016-10-12 DIAGNOSIS — R278 Other lack of coordination: Secondary | ICD-10-CM | POA: Diagnosis not present

## 2016-10-12 DIAGNOSIS — R296 Repeated falls: Secondary | ICD-10-CM | POA: Diagnosis not present

## 2016-10-12 DIAGNOSIS — F419 Anxiety disorder, unspecified: Secondary | ICD-10-CM | POA: Diagnosis not present

## 2016-10-12 DIAGNOSIS — R1311 Dysphagia, oral phase: Secondary | ICD-10-CM | POA: Diagnosis not present

## 2016-10-12 DIAGNOSIS — J1089 Influenza due to other identified influenza virus with other manifestations: Secondary | ICD-10-CM | POA: Diagnosis not present

## 2016-10-12 DIAGNOSIS — M6281 Muscle weakness (generalized): Secondary | ICD-10-CM | POA: Diagnosis not present

## 2016-10-13 DIAGNOSIS — R21 Rash and other nonspecific skin eruption: Secondary | ICD-10-CM | POA: Diagnosis not present

## 2016-10-13 DIAGNOSIS — I10 Essential (primary) hypertension: Secondary | ICD-10-CM | POA: Diagnosis not present

## 2016-10-13 DIAGNOSIS — R609 Edema, unspecified: Secondary | ICD-10-CM | POA: Diagnosis not present

## 2016-10-13 DIAGNOSIS — M6281 Muscle weakness (generalized): Secondary | ICD-10-CM | POA: Diagnosis not present

## 2016-10-13 DIAGNOSIS — R278 Other lack of coordination: Secondary | ICD-10-CM | POA: Diagnosis not present

## 2016-10-13 DIAGNOSIS — R1311 Dysphagia, oral phase: Secondary | ICD-10-CM | POA: Diagnosis not present

## 2016-10-13 DIAGNOSIS — J1089 Influenza due to other identified influenza virus with other manifestations: Secondary | ICD-10-CM | POA: Diagnosis not present

## 2016-10-13 DIAGNOSIS — E119 Type 2 diabetes mellitus without complications: Secondary | ICD-10-CM | POA: Diagnosis not present

## 2016-10-14 DIAGNOSIS — J1089 Influenza due to other identified influenza virus with other manifestations: Secondary | ICD-10-CM | POA: Diagnosis not present

## 2016-10-14 DIAGNOSIS — R278 Other lack of coordination: Secondary | ICD-10-CM | POA: Diagnosis not present

## 2016-10-14 DIAGNOSIS — R1311 Dysphagia, oral phase: Secondary | ICD-10-CM | POA: Diagnosis not present

## 2016-10-14 DIAGNOSIS — M6281 Muscle weakness (generalized): Secondary | ICD-10-CM | POA: Diagnosis not present

## 2016-10-17 DIAGNOSIS — R1311 Dysphagia, oral phase: Secondary | ICD-10-CM | POA: Diagnosis not present

## 2016-10-17 DIAGNOSIS — R278 Other lack of coordination: Secondary | ICD-10-CM | POA: Diagnosis not present

## 2016-10-17 DIAGNOSIS — J1089 Influenza due to other identified influenza virus with other manifestations: Secondary | ICD-10-CM | POA: Diagnosis not present

## 2016-10-17 DIAGNOSIS — M6281 Muscle weakness (generalized): Secondary | ICD-10-CM | POA: Diagnosis not present

## 2016-10-18 DIAGNOSIS — R1311 Dysphagia, oral phase: Secondary | ICD-10-CM | POA: Diagnosis not present

## 2016-10-18 DIAGNOSIS — R278 Other lack of coordination: Secondary | ICD-10-CM | POA: Diagnosis not present

## 2016-10-18 DIAGNOSIS — J1089 Influenza due to other identified influenza virus with other manifestations: Secondary | ICD-10-CM | POA: Diagnosis not present

## 2016-10-18 DIAGNOSIS — M6281 Muscle weakness (generalized): Secondary | ICD-10-CM | POA: Diagnosis not present

## 2016-10-19 DIAGNOSIS — J1089 Influenza due to other identified influenza virus with other manifestations: Secondary | ICD-10-CM | POA: Diagnosis not present

## 2016-10-19 DIAGNOSIS — R1311 Dysphagia, oral phase: Secondary | ICD-10-CM | POA: Diagnosis not present

## 2016-10-19 DIAGNOSIS — M6281 Muscle weakness (generalized): Secondary | ICD-10-CM | POA: Diagnosis not present

## 2016-10-19 DIAGNOSIS — R278 Other lack of coordination: Secondary | ICD-10-CM | POA: Diagnosis not present

## 2016-10-20 DIAGNOSIS — R1311 Dysphagia, oral phase: Secondary | ICD-10-CM | POA: Diagnosis not present

## 2016-10-20 DIAGNOSIS — R278 Other lack of coordination: Secondary | ICD-10-CM | POA: Diagnosis not present

## 2016-10-20 DIAGNOSIS — J1089 Influenza due to other identified influenza virus with other manifestations: Secondary | ICD-10-CM | POA: Diagnosis not present

## 2016-10-20 DIAGNOSIS — M6281 Muscle weakness (generalized): Secondary | ICD-10-CM | POA: Diagnosis not present

## 2016-10-21 DIAGNOSIS — J1089 Influenza due to other identified influenza virus with other manifestations: Secondary | ICD-10-CM | POA: Diagnosis not present

## 2016-10-21 DIAGNOSIS — R1311 Dysphagia, oral phase: Secondary | ICD-10-CM | POA: Diagnosis not present

## 2016-10-21 DIAGNOSIS — R278 Other lack of coordination: Secondary | ICD-10-CM | POA: Diagnosis not present

## 2016-10-21 DIAGNOSIS — M6281 Muscle weakness (generalized): Secondary | ICD-10-CM | POA: Diagnosis not present

## 2016-10-24 DIAGNOSIS — J1089 Influenza due to other identified influenza virus with other manifestations: Secondary | ICD-10-CM | POA: Diagnosis not present

## 2016-10-24 DIAGNOSIS — R278 Other lack of coordination: Secondary | ICD-10-CM | POA: Diagnosis not present

## 2016-10-24 DIAGNOSIS — M6281 Muscle weakness (generalized): Secondary | ICD-10-CM | POA: Diagnosis not present

## 2016-10-24 DIAGNOSIS — R1311 Dysphagia, oral phase: Secondary | ICD-10-CM | POA: Diagnosis not present

## 2016-10-25 DIAGNOSIS — F429 Obsessive-compulsive disorder, unspecified: Secondary | ICD-10-CM | POA: Diagnosis not present

## 2016-10-25 DIAGNOSIS — F419 Anxiety disorder, unspecified: Secondary | ICD-10-CM | POA: Diagnosis not present

## 2016-10-25 DIAGNOSIS — M6281 Muscle weakness (generalized): Secondary | ICD-10-CM | POA: Diagnosis not present

## 2016-10-25 DIAGNOSIS — R278 Other lack of coordination: Secondary | ICD-10-CM | POA: Diagnosis not present

## 2016-10-25 DIAGNOSIS — J1089 Influenza due to other identified influenza virus with other manifestations: Secondary | ICD-10-CM | POA: Diagnosis not present

## 2016-10-25 DIAGNOSIS — R1311 Dysphagia, oral phase: Secondary | ICD-10-CM | POA: Diagnosis not present

## 2016-10-26 DIAGNOSIS — J1089 Influenza due to other identified influenza virus with other manifestations: Secondary | ICD-10-CM | POA: Diagnosis not present

## 2016-10-26 DIAGNOSIS — R278 Other lack of coordination: Secondary | ICD-10-CM | POA: Diagnosis not present

## 2016-10-26 DIAGNOSIS — M6281 Muscle weakness (generalized): Secondary | ICD-10-CM | POA: Diagnosis not present

## 2016-10-26 DIAGNOSIS — R1311 Dysphagia, oral phase: Secondary | ICD-10-CM | POA: Diagnosis not present

## 2016-10-27 DIAGNOSIS — J1089 Influenza due to other identified influenza virus with other manifestations: Secondary | ICD-10-CM | POA: Diagnosis not present

## 2016-10-27 DIAGNOSIS — M6281 Muscle weakness (generalized): Secondary | ICD-10-CM | POA: Diagnosis not present

## 2016-10-27 DIAGNOSIS — R278 Other lack of coordination: Secondary | ICD-10-CM | POA: Diagnosis not present

## 2016-10-27 DIAGNOSIS — R1311 Dysphagia, oral phase: Secondary | ICD-10-CM | POA: Diagnosis not present

## 2016-10-28 ENCOUNTER — Ambulatory Visit
Admission: RE | Admit: 2016-10-28 | Discharge: 2016-10-28 | Disposition: A | Discharge status: Home / Self Care | Payer: Medicare Other | Source: Ambulatory Visit | Attending: Internal Medicine | Admitting: Internal Medicine

## 2016-10-28 DIAGNOSIS — Z1231 Encounter for screening mammogram for malignant neoplasm of breast: Secondary | ICD-10-CM

## 2016-10-28 DIAGNOSIS — R278 Other lack of coordination: Secondary | ICD-10-CM | POA: Diagnosis not present

## 2016-10-28 DIAGNOSIS — M6281 Muscle weakness (generalized): Secondary | ICD-10-CM | POA: Diagnosis not present

## 2016-10-28 DIAGNOSIS — R1311 Dysphagia, oral phase: Secondary | ICD-10-CM | POA: Diagnosis not present

## 2016-10-28 DIAGNOSIS — J1089 Influenza due to other identified influenza virus with other manifestations: Secondary | ICD-10-CM | POA: Diagnosis not present

## 2016-10-29 DIAGNOSIS — J1089 Influenza due to other identified influenza virus with other manifestations: Secondary | ICD-10-CM | POA: Diagnosis not present

## 2016-10-29 DIAGNOSIS — R1311 Dysphagia, oral phase: Secondary | ICD-10-CM | POA: Diagnosis not present

## 2016-10-29 DIAGNOSIS — M6281 Muscle weakness (generalized): Secondary | ICD-10-CM | POA: Diagnosis not present

## 2016-10-29 DIAGNOSIS — R278 Other lack of coordination: Secondary | ICD-10-CM | POA: Diagnosis not present

## 2016-10-31 DIAGNOSIS — J1089 Influenza due to other identified influenza virus with other manifestations: Secondary | ICD-10-CM | POA: Diagnosis not present

## 2016-10-31 DIAGNOSIS — R278 Other lack of coordination: Secondary | ICD-10-CM | POA: Diagnosis not present

## 2016-10-31 DIAGNOSIS — M6281 Muscle weakness (generalized): Secondary | ICD-10-CM | POA: Diagnosis not present

## 2016-10-31 DIAGNOSIS — R1311 Dysphagia, oral phase: Secondary | ICD-10-CM | POA: Diagnosis not present

## 2016-11-01 DIAGNOSIS — L299 Pruritus, unspecified: Secondary | ICD-10-CM | POA: Diagnosis not present

## 2016-11-01 DIAGNOSIS — F0391 Unspecified dementia with behavioral disturbance: Secondary | ICD-10-CM | POA: Diagnosis not present

## 2016-11-01 DIAGNOSIS — N183 Chronic kidney disease, stage 3 (moderate): Secondary | ICD-10-CM | POA: Diagnosis not present

## 2016-11-01 DIAGNOSIS — R1311 Dysphagia, oral phase: Secondary | ICD-10-CM | POA: Diagnosis not present

## 2016-11-01 DIAGNOSIS — R278 Other lack of coordination: Secondary | ICD-10-CM | POA: Diagnosis not present

## 2016-11-01 DIAGNOSIS — M6281 Muscle weakness (generalized): Secondary | ICD-10-CM | POA: Diagnosis not present

## 2016-11-01 DIAGNOSIS — J1089 Influenza due to other identified influenza virus with other manifestations: Secondary | ICD-10-CM | POA: Diagnosis not present

## 2016-11-01 DIAGNOSIS — I4891 Unspecified atrial fibrillation: Secondary | ICD-10-CM | POA: Diagnosis not present

## 2016-11-02 DIAGNOSIS — F419 Anxiety disorder, unspecified: Secondary | ICD-10-CM | POA: Diagnosis not present

## 2016-11-02 DIAGNOSIS — R278 Other lack of coordination: Secondary | ICD-10-CM | POA: Diagnosis not present

## 2016-11-02 DIAGNOSIS — J1089 Influenza due to other identified influenza virus with other manifestations: Secondary | ICD-10-CM | POA: Diagnosis not present

## 2016-11-02 DIAGNOSIS — M6281 Muscle weakness (generalized): Secondary | ICD-10-CM | POA: Diagnosis not present

## 2016-11-02 DIAGNOSIS — R1311 Dysphagia, oral phase: Secondary | ICD-10-CM | POA: Diagnosis not present

## 2016-11-07 DIAGNOSIS — J3489 Other specified disorders of nose and nasal sinuses: Secondary | ICD-10-CM | POA: Diagnosis not present

## 2016-11-07 DIAGNOSIS — L299 Pruritus, unspecified: Secondary | ICD-10-CM | POA: Diagnosis not present

## 2016-11-07 DIAGNOSIS — E119 Type 2 diabetes mellitus without complications: Secondary | ICD-10-CM | POA: Diagnosis not present

## 2016-11-07 DIAGNOSIS — J302 Other seasonal allergic rhinitis: Secondary | ICD-10-CM | POA: Diagnosis not present

## 2016-11-08 DIAGNOSIS — F429 Obsessive-compulsive disorder, unspecified: Secondary | ICD-10-CM | POA: Diagnosis not present

## 2016-11-08 DIAGNOSIS — F419 Anxiety disorder, unspecified: Secondary | ICD-10-CM | POA: Diagnosis not present

## 2016-11-09 DIAGNOSIS — F419 Anxiety disorder, unspecified: Secondary | ICD-10-CM | POA: Diagnosis not present

## 2016-11-15 DIAGNOSIS — E119 Type 2 diabetes mellitus without complications: Secondary | ICD-10-CM | POA: Diagnosis not present

## 2016-11-16 DIAGNOSIS — F419 Anxiety disorder, unspecified: Secondary | ICD-10-CM | POA: Diagnosis not present

## 2016-11-21 DIAGNOSIS — R079 Chest pain, unspecified: Secondary | ICD-10-CM | POA: Diagnosis not present

## 2016-11-21 DIAGNOSIS — I4891 Unspecified atrial fibrillation: Secondary | ICD-10-CM | POA: Diagnosis not present

## 2016-11-21 DIAGNOSIS — R0789 Other chest pain: Secondary | ICD-10-CM | POA: Diagnosis not present

## 2016-11-21 DIAGNOSIS — I10 Essential (primary) hypertension: Secondary | ICD-10-CM | POA: Diagnosis not present

## 2016-11-21 DIAGNOSIS — N183 Chronic kidney disease, stage 3 (moderate): Secondary | ICD-10-CM | POA: Diagnosis not present

## 2016-11-23 DIAGNOSIS — F419 Anxiety disorder, unspecified: Secondary | ICD-10-CM | POA: Diagnosis not present

## 2016-11-30 DIAGNOSIS — N183 Chronic kidney disease, stage 3 (moderate): Secondary | ICD-10-CM | POA: Diagnosis not present

## 2016-11-30 DIAGNOSIS — F418 Other specified anxiety disorders: Secondary | ICD-10-CM | POA: Diagnosis not present

## 2016-11-30 DIAGNOSIS — I10 Essential (primary) hypertension: Secondary | ICD-10-CM | POA: Diagnosis not present

## 2016-11-30 DIAGNOSIS — E119 Type 2 diabetes mellitus without complications: Secondary | ICD-10-CM | POA: Diagnosis not present

## 2016-11-30 DIAGNOSIS — I639 Cerebral infarction, unspecified: Secondary | ICD-10-CM | POA: Diagnosis not present

## 2016-12-01 DIAGNOSIS — H40043 Steroid responder, bilateral: Secondary | ICD-10-CM | POA: Diagnosis not present

## 2016-12-01 DIAGNOSIS — H401123 Primary open-angle glaucoma, left eye, severe stage: Secondary | ICD-10-CM | POA: Diagnosis not present

## 2016-12-01 DIAGNOSIS — F419 Anxiety disorder, unspecified: Secondary | ICD-10-CM | POA: Diagnosis not present

## 2016-12-01 DIAGNOSIS — Z961 Presence of intraocular lens: Secondary | ICD-10-CM | POA: Diagnosis not present

## 2016-12-01 DIAGNOSIS — H401114 Primary open-angle glaucoma, right eye, indeterminate stage: Secondary | ICD-10-CM | POA: Diagnosis not present

## 2016-12-01 DIAGNOSIS — F429 Obsessive-compulsive disorder, unspecified: Secondary | ICD-10-CM | POA: Diagnosis not present

## 2016-12-07 DIAGNOSIS — F419 Anxiety disorder, unspecified: Secondary | ICD-10-CM | POA: Diagnosis not present

## 2016-12-07 DIAGNOSIS — H9202 Otalgia, left ear: Secondary | ICD-10-CM | POA: Diagnosis not present

## 2016-12-07 DIAGNOSIS — H6122 Impacted cerumen, left ear: Secondary | ICD-10-CM | POA: Diagnosis not present

## 2016-12-07 DIAGNOSIS — N183 Chronic kidney disease, stage 3 (moderate): Secondary | ICD-10-CM | POA: Diagnosis not present

## 2016-12-07 DIAGNOSIS — I4891 Unspecified atrial fibrillation: Secondary | ICD-10-CM | POA: Diagnosis not present

## 2016-12-12 DIAGNOSIS — N183 Chronic kidney disease, stage 3 (moderate): Secondary | ICD-10-CM | POA: Diagnosis not present

## 2016-12-12 DIAGNOSIS — R2232 Localized swelling, mass and lump, left upper limb: Secondary | ICD-10-CM | POA: Diagnosis not present

## 2016-12-12 DIAGNOSIS — E119 Type 2 diabetes mellitus without complications: Secondary | ICD-10-CM | POA: Diagnosis not present

## 2016-12-12 DIAGNOSIS — I4891 Unspecified atrial fibrillation: Secondary | ICD-10-CM | POA: Diagnosis not present

## 2016-12-13 DIAGNOSIS — E785 Hyperlipidemia, unspecified: Secondary | ICD-10-CM | POA: Diagnosis not present

## 2016-12-14 ENCOUNTER — Ambulatory Visit (INDEPENDENT_AMBULATORY_CARE_PROVIDER_SITE_OTHER): Payer: Medicare Other | Admitting: Allergy and Immunology

## 2016-12-14 ENCOUNTER — Encounter: Payer: Self-pay | Admitting: Allergy and Immunology

## 2016-12-14 VITALS — BP 116/70 | HR 93 | Resp 22

## 2016-12-14 DIAGNOSIS — E119 Type 2 diabetes mellitus without complications: Secondary | ICD-10-CM | POA: Diagnosis not present

## 2016-12-14 DIAGNOSIS — F0391 Unspecified dementia with behavioral disturbance: Secondary | ICD-10-CM | POA: Diagnosis not present

## 2016-12-14 DIAGNOSIS — B3731 Acute candidiasis of vulva and vagina: Secondary | ICD-10-CM

## 2016-12-14 DIAGNOSIS — M109 Gout, unspecified: Secondary | ICD-10-CM | POA: Diagnosis not present

## 2016-12-14 DIAGNOSIS — B373 Candidiasis of vulva and vagina: Secondary | ICD-10-CM | POA: Diagnosis not present

## 2016-12-14 DIAGNOSIS — I4891 Unspecified atrial fibrillation: Secondary | ICD-10-CM | POA: Diagnosis not present

## 2016-12-14 DIAGNOSIS — F419 Anxiety disorder, unspecified: Secondary | ICD-10-CM | POA: Diagnosis not present

## 2016-12-14 DIAGNOSIS — L299 Pruritus, unspecified: Secondary | ICD-10-CM | POA: Diagnosis not present

## 2016-12-14 NOTE — Progress Notes (Signed)
Follow-up Note  Referring Provider: Reynold Bowen, MD Primary Provider: Sheela Stack, MD Date of Office Visit: 12/14/2016  Subjective:   Joanna Hill (DOB: [birth year]) is a 83 y.o. female who returns to the [facility] on 12/14/2016 in re-evaluation of the following:  HPI: Joanna Hill returns to this clinic in reevaluation of her pruritic disorder. I last saw her in this clinic February 2018 at which point in time we had her use 2 forms of H1 receptor blockers while continuing to use an H2 receptor blocker.  She still very itchy from the top of her head down to the bottom of her toe. She is actually scratching herself less though. As well, she uses a moisturization cream after she has a bath or shower every day which she does think has helped her skin somewhat. But overall she still is itchy and it is still driving her somewhat crazy. She has not developed any new associated systemic or constitutional symptoms that have helped her determine what is causing this itchiness.  She did mention that she is very irritated around in her vaginal area and she is scratching this area to some degree.  Allergies as of 12/14/2016   No Known Allergies     Medication List      acetaminophen 325 MG tablet Commonly known as:  TYLENOL Take 650 mg by mouth every 6 (six) hours as needed for mild pain, moderate pain, fever or headache.   amLODipine 10 MG tablet Commonly known as:  NORVASC Take 10 mg by mouth daily.   antiseptic oral rinse Liqd 5 mLs by Mouth Rinse route 2 (two) times daily.   ARTIFICIAL TEAR OP Place 1 drop into both eyes 4 (four) times daily.   Carboxymethylcellulose Sodium 0.25 % Soln Apply 1 drop to eye 4 (four) times daily.   cephALEXin 250 MG capsule Commonly known as:  KEFLEX   cetirizine 10 MG tablet Commonly known as:  ZYRTEC Take 1 tablet (10 mg total) by mouth 2 (two) times daily.   cyproheptadine 4 MG tablet Commonly known as:  PERIACTIN Take  one tablet twice daily   diphenhydrAMINE 25 mg capsule Commonly known as:  BENADRYL Take 25 mg by mouth every 6 (six) hours as needed.   docusate sodium 100 MG capsule Commonly known as:  COLACE Take 100 mg by mouth 2 (two) times daily.   donepezil 10 MG tablet Commonly known as:  ARICEPT Take 10 mg by mouth at bedtime.   fluticasone 50 MCG/ACT nasal spray Commonly known as:  FLONASE Place 1 spray into both nostrils 2 (two) times daily.   furosemide 20 MG tablet Commonly known as:  LASIX Take 40 mg by mouth 2 (two) times daily.   gabapentin 300 MG capsule Commonly known as:  NEURONTIN Take 300 mg by mouth 2 (two) times daily.   insulin detemir 100 UNIT/ML injection Commonly known as:  LEVEMIR Inject 25-40 Units into the skin 2 (two) times daily. Use 40 units every morning and use 25 units every evening   LEVEMIR FLEXTOUCH 100 UNIT/ML Pen Generic drug:  Insulin Detemir   HUMALOG KWIKPEN 100 UNIT/ML KiwkPen Generic drug:  insulin lispro   insulin lispro 100 UNIT/ML injection Commonly known as:  HUMALOG Inject 7 Units into the skin daily with breakfast.   ketotifen 0.025 % ophthalmic solution Commonly known as:  ZADITOR Place 1 drop into both eyes 2 (two) times daily.   LANTUS SOLOSTAR 100 UNIT/ML Solostar Pen Generic drug:  Insulin Glargine   levothyroxine 75 MCG tablet Commonly known as:  SYNTHROID, LEVOTHROID Take 75 mcg by mouth daily before breakfast.   loratadine 10 MG tablet Commonly known as:  CLARITIN Take one tablet twice a day.   LUVOX CR 150 MG Cp24 Generic drug:  Fluvoxamine Maleate Take 150 mg by mouth at bedtime.   metoprolol tartrate 25 MG tablet Commonly known as:  LOPRESSOR Take 25 mg by mouth 2 (two) times daily.   mirtazapine 15 MG tablet Commonly known as:  REMERON Take 15 mg by mouth at bedtime.   mometasone 0.1 % ointment Commonly known as:  ELOCON Apply to skin once daily as directed.   MUCINEX D 60-600 MG 12 hr  tablet Generic drug:  pseudoephedrine-guaifenesin Take 1 tablet by mouth every 12 (twelve) hours.   neomycin-polymyxin-dexameth 0.1 % Oint Commonly known as:  MAXITROL Place 1 application into both eyes at bedtime.   NOVOLOG FLEXPEN 100 UNIT/ML FlexPen Generic drug:  insulin aspart   OCUSOFT LID SCRUB Pads Apply 1 each topically 2 (two) times daily.   pantoprazole 40 MG tablet Commonly known as:  PROTONIX   potassium chloride SA 20 MEQ tablet Commonly known as:  K-DUR,KLOR-CON Take 1 tablet (20 mEq total) by mouth daily.   ranitidine 150 MG tablet Commonly known as:  ZANTAC Take 150 mg by mouth 2 (two) times daily.   Rivaroxaban 15 MG Tabs tablet Commonly known as:  XARELTO Take 15 mg by mouth daily with supper.   sucralfate 1 g tablet Commonly known as:  CARAFATE Take 1 g by mouth 4 (four) times daily.   triamcinolone cream 0.5 % Commonly known as:  KENALOG   triamcinolone cream 0.1 % Commonly known as:  KENALOG   valsartan 160 MG tablet Commonly known as:  DIOVAN Take 160 mg by mouth daily.       Past Medical History:  Diagnosis Date  . Atrial fibrillation ([place])   . Blindness   . Cancer ([place])    breast  . Carotid artery occlusion   . Dementia   . Diabetes mellitus   . GERD (gastroesophageal reflux disease)   . Hyperlipidemia   . Hypertension   . Hypothyroidism   . Stroke [facility]) 2009 and  2011   difficulty with swallowing    Past Surgical History:  Procedure Laterality Date  . ABDOMINAL HYSTERECTOMY    . Arch Aortogram  03-15-2010  . MASTECTOMY, PARTIAL Right   . ROTATOR CUFF REPAIR Left 1981    Review of systems negative except as noted in HPI / PMHx or noted below:  Review of Systems  Constitutional: Negative.   HENT: Negative.   Eyes: Negative.   Respiratory: Negative.   Cardiovascular: Negative.   Gastrointestinal: Negative.   Genitourinary: Negative.   Musculoskeletal: Negative.   Skin: Negative.   Neurological: Negative.    Endo/Heme/Allergies: Negative.   Psychiatric/Behavioral: Negative.      Objective:   Vitals:   12/14/16 1114  BP: 116/70  Pulse: 93  Resp: (!) 22          Physical Exam  Skin: Rash (no evidence of excoriation, papules, plaques, or urticaria. Some very slight scale affecting neck.) noted.    Diagnostics: None   Assessment and Plan:   1. Pruritic disorder   2. Vaginal candidiasis     1. Use a combination of the following medications every day:   A. Cetirizine 10mg  one tablet two times per day  B. Ranitidine 150 one tablet two  times per day   C. loratadine 10 mg one tablet 2 times per day  2. Continue moisturization of skin after bath / shower  3. Taper off amlodipine and utilize a different blood pressure medication as directed by primary care doctor.  4. Have primary care doctor administer treatment for possible vaginal candidiasis  5. Return to clinic in 12 weeks    Marzell will now taper off her amlodipine in an attempt to eliminate a possible culprit giving rise to her pruritic disorder. We will pass on the suggestion to her primary care doctor so that he can provide her with a replacement for amlodipine. She will continue to use the therapy described above which actually does appear to be helping her somewhat especially given the appearance of her skin today. We will give her 12 weeks off amlodipine and then make a decision about how to proceed pending her response. As well, I did inform her that we will contact her primary care doctor about administering some therapy for her possible vaginal candidiasis.  Allena Katz, MD Allergy / Immunology [place]

## 2016-12-14 NOTE — Patient Instructions (Addendum)
  1. Use a combination of the following medications every day:   A. Cetirizine 10mg  one tablet two times per day  B. Ranitidine 150 one tablet two times per day   C. loratadine 10 mg one tablet 2 times per day  2. Continue moisturization of skin after bath / shower  3. Taper off amlodipine and utilize a different blood pressure medication as directed by primary care doctor.  4. Have primary care doctor administer treatment for possible vaginal candidiasis  5. Return to clinic in 12 weeks

## 2016-12-15 DIAGNOSIS — M109 Gout, unspecified: Secondary | ICD-10-CM | POA: Diagnosis not present

## 2016-12-15 DIAGNOSIS — L299 Pruritus, unspecified: Secondary | ICD-10-CM | POA: Diagnosis not present

## 2016-12-15 DIAGNOSIS — I10 Essential (primary) hypertension: Secondary | ICD-10-CM | POA: Diagnosis not present

## 2016-12-15 DIAGNOSIS — I4891 Unspecified atrial fibrillation: Secondary | ICD-10-CM | POA: Diagnosis not present

## 2016-12-21 DIAGNOSIS — F419 Anxiety disorder, unspecified: Secondary | ICD-10-CM | POA: Diagnosis not present

## 2017-01-11 DIAGNOSIS — R296 Repeated falls: Secondary | ICD-10-CM | POA: Diagnosis not present

## 2017-01-11 DIAGNOSIS — R278 Other lack of coordination: Secondary | ICD-10-CM | POA: Diagnosis not present

## 2017-01-11 DIAGNOSIS — F419 Anxiety disorder, unspecified: Secondary | ICD-10-CM | POA: Diagnosis not present

## 2017-01-11 DIAGNOSIS — I69898 Other sequelae of other cerebrovascular disease: Secondary | ICD-10-CM | POA: Diagnosis not present

## 2017-01-11 DIAGNOSIS — F429 Obsessive-compulsive disorder, unspecified: Secondary | ICD-10-CM | POA: Diagnosis not present

## 2017-01-11 DIAGNOSIS — M6281 Muscle weakness (generalized): Secondary | ICD-10-CM | POA: Diagnosis not present

## 2017-01-11 DIAGNOSIS — R1311 Dysphagia, oral phase: Secondary | ICD-10-CM | POA: Diagnosis not present

## 2017-01-11 DIAGNOSIS — J45909 Unspecified asthma, uncomplicated: Secondary | ICD-10-CM | POA: Diagnosis not present

## 2017-01-11 DIAGNOSIS — J1089 Influenza due to other identified influenza virus with other manifestations: Secondary | ICD-10-CM | POA: Diagnosis not present

## 2017-01-12 DIAGNOSIS — R278 Other lack of coordination: Secondary | ICD-10-CM | POA: Diagnosis not present

## 2017-01-12 DIAGNOSIS — R1311 Dysphagia, oral phase: Secondary | ICD-10-CM | POA: Diagnosis not present

## 2017-01-12 DIAGNOSIS — I69898 Other sequelae of other cerebrovascular disease: Secondary | ICD-10-CM | POA: Diagnosis not present

## 2017-01-12 DIAGNOSIS — M6281 Muscle weakness (generalized): Secondary | ICD-10-CM | POA: Diagnosis not present

## 2017-01-12 DIAGNOSIS — J1089 Influenza due to other identified influenza virus with other manifestations: Secondary | ICD-10-CM | POA: Diagnosis not present

## 2017-01-12 DIAGNOSIS — R296 Repeated falls: Secondary | ICD-10-CM | POA: Diagnosis not present

## 2017-01-13 DIAGNOSIS — R1311 Dysphagia, oral phase: Secondary | ICD-10-CM | POA: Diagnosis not present

## 2017-01-13 DIAGNOSIS — R296 Repeated falls: Secondary | ICD-10-CM | POA: Diagnosis not present

## 2017-01-13 DIAGNOSIS — J1089 Influenza due to other identified influenza virus with other manifestations: Secondary | ICD-10-CM | POA: Diagnosis not present

## 2017-01-13 DIAGNOSIS — M6281 Muscle weakness (generalized): Secondary | ICD-10-CM | POA: Diagnosis not present

## 2017-01-13 DIAGNOSIS — J45909 Unspecified asthma, uncomplicated: Secondary | ICD-10-CM | POA: Diagnosis not present

## 2017-01-13 DIAGNOSIS — I69898 Other sequelae of other cerebrovascular disease: Secondary | ICD-10-CM | POA: Diagnosis not present

## 2017-01-13 DIAGNOSIS — R278 Other lack of coordination: Secondary | ICD-10-CM | POA: Diagnosis not present

## 2017-01-16 DIAGNOSIS — R278 Other lack of coordination: Secondary | ICD-10-CM | POA: Diagnosis not present

## 2017-01-16 DIAGNOSIS — M6281 Muscle weakness (generalized): Secondary | ICD-10-CM | POA: Diagnosis not present

## 2017-01-16 DIAGNOSIS — R1311 Dysphagia, oral phase: Secondary | ICD-10-CM | POA: Diagnosis not present

## 2017-01-16 DIAGNOSIS — R296 Repeated falls: Secondary | ICD-10-CM | POA: Diagnosis not present

## 2017-01-16 DIAGNOSIS — J1089 Influenza due to other identified influenza virus with other manifestations: Secondary | ICD-10-CM | POA: Diagnosis not present

## 2017-01-16 DIAGNOSIS — I69898 Other sequelae of other cerebrovascular disease: Secondary | ICD-10-CM | POA: Diagnosis not present

## 2017-01-17 DIAGNOSIS — I69898 Other sequelae of other cerebrovascular disease: Secondary | ICD-10-CM | POA: Diagnosis not present

## 2017-01-17 DIAGNOSIS — R296 Repeated falls: Secondary | ICD-10-CM | POA: Diagnosis not present

## 2017-01-17 DIAGNOSIS — R278 Other lack of coordination: Secondary | ICD-10-CM | POA: Diagnosis not present

## 2017-01-17 DIAGNOSIS — M6281 Muscle weakness (generalized): Secondary | ICD-10-CM | POA: Diagnosis not present

## 2017-01-17 DIAGNOSIS — J1089 Influenza due to other identified influenza virus with other manifestations: Secondary | ICD-10-CM | POA: Diagnosis not present

## 2017-01-17 DIAGNOSIS — R1311 Dysphagia, oral phase: Secondary | ICD-10-CM | POA: Diagnosis not present

## 2017-01-18 DIAGNOSIS — R296 Repeated falls: Secondary | ICD-10-CM | POA: Diagnosis not present

## 2017-01-18 DIAGNOSIS — J1089 Influenza due to other identified influenza virus with other manifestations: Secondary | ICD-10-CM | POA: Diagnosis not present

## 2017-01-18 DIAGNOSIS — M6281 Muscle weakness (generalized): Secondary | ICD-10-CM | POA: Diagnosis not present

## 2017-01-18 DIAGNOSIS — R278 Other lack of coordination: Secondary | ICD-10-CM | POA: Diagnosis not present

## 2017-01-18 DIAGNOSIS — R1311 Dysphagia, oral phase: Secondary | ICD-10-CM | POA: Diagnosis not present

## 2017-01-18 DIAGNOSIS — I69898 Other sequelae of other cerebrovascular disease: Secondary | ICD-10-CM | POA: Diagnosis not present

## 2017-01-18 DIAGNOSIS — F419 Anxiety disorder, unspecified: Secondary | ICD-10-CM | POA: Diagnosis not present

## 2017-01-19 DIAGNOSIS — I69898 Other sequelae of other cerebrovascular disease: Secondary | ICD-10-CM | POA: Diagnosis not present

## 2017-01-19 DIAGNOSIS — R1311 Dysphagia, oral phase: Secondary | ICD-10-CM | POA: Diagnosis not present

## 2017-01-19 DIAGNOSIS — M6281 Muscle weakness (generalized): Secondary | ICD-10-CM | POA: Diagnosis not present

## 2017-01-19 DIAGNOSIS — J1089 Influenza due to other identified influenza virus with other manifestations: Secondary | ICD-10-CM | POA: Diagnosis not present

## 2017-01-19 DIAGNOSIS — R278 Other lack of coordination: Secondary | ICD-10-CM | POA: Diagnosis not present

## 2017-01-19 DIAGNOSIS — R296 Repeated falls: Secondary | ICD-10-CM | POA: Diagnosis not present

## 2017-01-20 DIAGNOSIS — M6281 Muscle weakness (generalized): Secondary | ICD-10-CM | POA: Diagnosis not present

## 2017-01-20 DIAGNOSIS — J1089 Influenza due to other identified influenza virus with other manifestations: Secondary | ICD-10-CM | POA: Diagnosis not present

## 2017-01-20 DIAGNOSIS — I69898 Other sequelae of other cerebrovascular disease: Secondary | ICD-10-CM | POA: Diagnosis not present

## 2017-01-20 DIAGNOSIS — R278 Other lack of coordination: Secondary | ICD-10-CM | POA: Diagnosis not present

## 2017-01-20 DIAGNOSIS — R1311 Dysphagia, oral phase: Secondary | ICD-10-CM | POA: Diagnosis not present

## 2017-01-20 DIAGNOSIS — R296 Repeated falls: Secondary | ICD-10-CM | POA: Diagnosis not present

## 2017-01-23 DIAGNOSIS — M6281 Muscle weakness (generalized): Secondary | ICD-10-CM | POA: Diagnosis not present

## 2017-01-23 DIAGNOSIS — J1089 Influenza due to other identified influenza virus with other manifestations: Secondary | ICD-10-CM | POA: Diagnosis not present

## 2017-01-23 DIAGNOSIS — R1311 Dysphagia, oral phase: Secondary | ICD-10-CM | POA: Diagnosis not present

## 2017-01-23 DIAGNOSIS — I69898 Other sequelae of other cerebrovascular disease: Secondary | ICD-10-CM | POA: Diagnosis not present

## 2017-01-23 DIAGNOSIS — R278 Other lack of coordination: Secondary | ICD-10-CM | POA: Diagnosis not present

## 2017-01-23 DIAGNOSIS — R296 Repeated falls: Secondary | ICD-10-CM | POA: Diagnosis not present

## 2017-01-24 DIAGNOSIS — W19XXXD Unspecified fall, subsequent encounter: Secondary | ICD-10-CM | POA: Diagnosis not present

## 2017-01-24 DIAGNOSIS — R1311 Dysphagia, oral phase: Secondary | ICD-10-CM | POA: Diagnosis not present

## 2017-01-24 DIAGNOSIS — R296 Repeated falls: Secondary | ICD-10-CM | POA: Diagnosis not present

## 2017-01-24 DIAGNOSIS — M6281 Muscle weakness (generalized): Secondary | ICD-10-CM | POA: Diagnosis not present

## 2017-01-24 DIAGNOSIS — I10 Essential (primary) hypertension: Secondary | ICD-10-CM | POA: Diagnosis not present

## 2017-01-24 DIAGNOSIS — R278 Other lack of coordination: Secondary | ICD-10-CM | POA: Diagnosis not present

## 2017-01-24 DIAGNOSIS — J1089 Influenza due to other identified influenza virus with other manifestations: Secondary | ICD-10-CM | POA: Diagnosis not present

## 2017-01-24 DIAGNOSIS — I4891 Unspecified atrial fibrillation: Secondary | ICD-10-CM | POA: Diagnosis not present

## 2017-01-24 DIAGNOSIS — I69898 Other sequelae of other cerebrovascular disease: Secondary | ICD-10-CM | POA: Diagnosis not present

## 2017-01-24 DIAGNOSIS — F0391 Unspecified dementia with behavioral disturbance: Secondary | ICD-10-CM | POA: Diagnosis not present

## 2017-01-25 DIAGNOSIS — M25569 Pain in unspecified knee: Secondary | ICD-10-CM | POA: Diagnosis not present

## 2017-01-25 DIAGNOSIS — I69898 Other sequelae of other cerebrovascular disease: Secondary | ICD-10-CM | POA: Diagnosis not present

## 2017-01-25 DIAGNOSIS — R278 Other lack of coordination: Secondary | ICD-10-CM | POA: Diagnosis not present

## 2017-01-25 DIAGNOSIS — M25562 Pain in left knee: Secondary | ICD-10-CM | POA: Diagnosis not present

## 2017-01-25 DIAGNOSIS — I2510 Atherosclerotic heart disease of native coronary artery without angina pectoris: Secondary | ICD-10-CM | POA: Diagnosis not present

## 2017-01-25 DIAGNOSIS — M6281 Muscle weakness (generalized): Secondary | ICD-10-CM | POA: Diagnosis not present

## 2017-01-25 DIAGNOSIS — F0390 Unspecified dementia without behavioral disturbance: Secondary | ICD-10-CM | POA: Diagnosis not present

## 2017-01-25 DIAGNOSIS — J1089 Influenza due to other identified influenza virus with other manifestations: Secondary | ICD-10-CM | POA: Diagnosis not present

## 2017-01-25 DIAGNOSIS — R296 Repeated falls: Secondary | ICD-10-CM | POA: Diagnosis not present

## 2017-01-25 DIAGNOSIS — R1311 Dysphagia, oral phase: Secondary | ICD-10-CM | POA: Diagnosis not present

## 2017-01-25 DIAGNOSIS — F419 Anxiety disorder, unspecified: Secondary | ICD-10-CM | POA: Diagnosis not present

## 2017-01-25 DIAGNOSIS — I509 Heart failure, unspecified: Secondary | ICD-10-CM | POA: Diagnosis not present

## 2017-01-25 DIAGNOSIS — N183 Chronic kidney disease, stage 3 (moderate): Secondary | ICD-10-CM | POA: Diagnosis not present

## 2017-01-25 DIAGNOSIS — E0822 Diabetes mellitus due to underlying condition with diabetic chronic kidney disease: Secondary | ICD-10-CM | POA: Diagnosis not present

## 2017-01-25 DIAGNOSIS — I639 Cerebral infarction, unspecified: Secondary | ICD-10-CM | POA: Diagnosis not present

## 2017-01-26 DIAGNOSIS — I69898 Other sequelae of other cerebrovascular disease: Secondary | ICD-10-CM | POA: Diagnosis not present

## 2017-01-26 DIAGNOSIS — J1089 Influenza due to other identified influenza virus with other manifestations: Secondary | ICD-10-CM | POA: Diagnosis not present

## 2017-01-26 DIAGNOSIS — R296 Repeated falls: Secondary | ICD-10-CM | POA: Diagnosis not present

## 2017-01-26 DIAGNOSIS — R1311 Dysphagia, oral phase: Secondary | ICD-10-CM | POA: Diagnosis not present

## 2017-01-26 DIAGNOSIS — R278 Other lack of coordination: Secondary | ICD-10-CM | POA: Diagnosis not present

## 2017-01-26 DIAGNOSIS — M6281 Muscle weakness (generalized): Secondary | ICD-10-CM | POA: Diagnosis not present

## 2017-01-27 DIAGNOSIS — R296 Repeated falls: Secondary | ICD-10-CM | POA: Diagnosis not present

## 2017-01-27 DIAGNOSIS — J1089 Influenza due to other identified influenza virus with other manifestations: Secondary | ICD-10-CM | POA: Diagnosis not present

## 2017-01-27 DIAGNOSIS — I69898 Other sequelae of other cerebrovascular disease: Secondary | ICD-10-CM | POA: Diagnosis not present

## 2017-01-27 DIAGNOSIS — R278 Other lack of coordination: Secondary | ICD-10-CM | POA: Diagnosis not present

## 2017-01-27 DIAGNOSIS — M6281 Muscle weakness (generalized): Secondary | ICD-10-CM | POA: Diagnosis not present

## 2017-01-27 DIAGNOSIS — R1311 Dysphagia, oral phase: Secondary | ICD-10-CM | POA: Diagnosis not present

## 2017-02-01 DIAGNOSIS — I10 Essential (primary) hypertension: Secondary | ICD-10-CM | POA: Diagnosis not present

## 2017-02-01 DIAGNOSIS — E119 Type 2 diabetes mellitus without complications: Secondary | ICD-10-CM | POA: Diagnosis not present

## 2017-02-01 DIAGNOSIS — I4891 Unspecified atrial fibrillation: Secondary | ICD-10-CM | POA: Diagnosis not present

## 2017-02-01 DIAGNOSIS — W19XXXD Unspecified fall, subsequent encounter: Secondary | ICD-10-CM | POA: Diagnosis not present

## 2017-02-01 DIAGNOSIS — F419 Anxiety disorder, unspecified: Secondary | ICD-10-CM | POA: Diagnosis not present

## 2017-02-06 DIAGNOSIS — M6281 Muscle weakness (generalized): Secondary | ICD-10-CM | POA: Diagnosis not present

## 2017-02-06 DIAGNOSIS — R278 Other lack of coordination: Secondary | ICD-10-CM | POA: Diagnosis not present

## 2017-02-06 DIAGNOSIS — R296 Repeated falls: Secondary | ICD-10-CM | POA: Diagnosis not present

## 2017-02-06 DIAGNOSIS — I69898 Other sequelae of other cerebrovascular disease: Secondary | ICD-10-CM | POA: Diagnosis not present

## 2017-02-06 DIAGNOSIS — J1089 Influenza due to other identified influenza virus with other manifestations: Secondary | ICD-10-CM | POA: Diagnosis not present

## 2017-02-06 DIAGNOSIS — R1311 Dysphagia, oral phase: Secondary | ICD-10-CM | POA: Diagnosis not present

## 2017-02-07 DIAGNOSIS — I4891 Unspecified atrial fibrillation: Secondary | ICD-10-CM | POA: Diagnosis not present

## 2017-02-07 DIAGNOSIS — J1089 Influenza due to other identified influenza virus with other manifestations: Secondary | ICD-10-CM | POA: Diagnosis not present

## 2017-02-07 DIAGNOSIS — M6281 Muscle weakness (generalized): Secondary | ICD-10-CM | POA: Diagnosis not present

## 2017-02-07 DIAGNOSIS — R296 Repeated falls: Secondary | ICD-10-CM | POA: Diagnosis not present

## 2017-02-07 DIAGNOSIS — W19XXXD Unspecified fall, subsequent encounter: Secondary | ICD-10-CM | POA: Diagnosis not present

## 2017-02-07 DIAGNOSIS — R1311 Dysphagia, oral phase: Secondary | ICD-10-CM | POA: Diagnosis not present

## 2017-02-07 DIAGNOSIS — I69898 Other sequelae of other cerebrovascular disease: Secondary | ICD-10-CM | POA: Diagnosis not present

## 2017-02-07 DIAGNOSIS — R278 Other lack of coordination: Secondary | ICD-10-CM | POA: Diagnosis not present

## 2017-02-07 DIAGNOSIS — I10 Essential (primary) hypertension: Secondary | ICD-10-CM | POA: Diagnosis not present

## 2017-02-08 DIAGNOSIS — F419 Anxiety disorder, unspecified: Secondary | ICD-10-CM | POA: Diagnosis not present

## 2017-02-08 DIAGNOSIS — E119 Type 2 diabetes mellitus without complications: Secondary | ICD-10-CM | POA: Diagnosis not present

## 2017-02-08 DIAGNOSIS — R1311 Dysphagia, oral phase: Secondary | ICD-10-CM | POA: Diagnosis not present

## 2017-02-08 DIAGNOSIS — I69898 Other sequelae of other cerebrovascular disease: Secondary | ICD-10-CM | POA: Diagnosis not present

## 2017-02-08 DIAGNOSIS — E039 Hypothyroidism, unspecified: Secondary | ICD-10-CM | POA: Diagnosis not present

## 2017-02-08 DIAGNOSIS — M6281 Muscle weakness (generalized): Secondary | ICD-10-CM | POA: Diagnosis not present

## 2017-02-08 DIAGNOSIS — I4891 Unspecified atrial fibrillation: Secondary | ICD-10-CM | POA: Diagnosis not present

## 2017-02-08 DIAGNOSIS — R278 Other lack of coordination: Secondary | ICD-10-CM | POA: Diagnosis not present

## 2017-02-08 DIAGNOSIS — D649 Anemia, unspecified: Secondary | ICD-10-CM | POA: Diagnosis not present

## 2017-02-08 DIAGNOSIS — M25462 Effusion, left knee: Secondary | ICD-10-CM | POA: Diagnosis not present

## 2017-02-08 DIAGNOSIS — I10 Essential (primary) hypertension: Secondary | ICD-10-CM | POA: Diagnosis not present

## 2017-02-08 DIAGNOSIS — E559 Vitamin D deficiency, unspecified: Secondary | ICD-10-CM | POA: Diagnosis not present

## 2017-02-08 DIAGNOSIS — R296 Repeated falls: Secondary | ICD-10-CM | POA: Diagnosis not present

## 2017-02-08 DIAGNOSIS — J1089 Influenza due to other identified influenza virus with other manifestations: Secondary | ICD-10-CM | POA: Diagnosis not present

## 2017-02-09 DIAGNOSIS — F419 Anxiety disorder, unspecified: Secondary | ICD-10-CM | POA: Diagnosis not present

## 2017-02-09 DIAGNOSIS — M25462 Effusion, left knee: Secondary | ICD-10-CM | POA: Diagnosis not present

## 2017-02-09 DIAGNOSIS — J1089 Influenza due to other identified influenza virus with other manifestations: Secondary | ICD-10-CM | POA: Diagnosis not present

## 2017-02-09 DIAGNOSIS — F429 Obsessive-compulsive disorder, unspecified: Secondary | ICD-10-CM | POA: Diagnosis not present

## 2017-02-09 DIAGNOSIS — I69898 Other sequelae of other cerebrovascular disease: Secondary | ICD-10-CM | POA: Diagnosis not present

## 2017-02-09 DIAGNOSIS — N390 Urinary tract infection, site not specified: Secondary | ICD-10-CM | POA: Diagnosis not present

## 2017-02-09 DIAGNOSIS — M6281 Muscle weakness (generalized): Secondary | ICD-10-CM | POA: Diagnosis not present

## 2017-02-09 DIAGNOSIS — R296 Repeated falls: Secondary | ICD-10-CM | POA: Diagnosis not present

## 2017-02-09 DIAGNOSIS — E119 Type 2 diabetes mellitus without complications: Secondary | ICD-10-CM | POA: Diagnosis not present

## 2017-02-09 DIAGNOSIS — R1311 Dysphagia, oral phase: Secondary | ICD-10-CM | POA: Diagnosis not present

## 2017-02-09 DIAGNOSIS — R278 Other lack of coordination: Secondary | ICD-10-CM | POA: Diagnosis not present

## 2017-02-09 DIAGNOSIS — N183 Chronic kidney disease, stage 3 (moderate): Secondary | ICD-10-CM | POA: Diagnosis not present

## 2017-02-09 DIAGNOSIS — R319 Hematuria, unspecified: Secondary | ICD-10-CM | POA: Diagnosis not present

## 2017-02-10 DIAGNOSIS — I69898 Other sequelae of other cerebrovascular disease: Secondary | ICD-10-CM | POA: Diagnosis not present

## 2017-02-10 DIAGNOSIS — R296 Repeated falls: Secondary | ICD-10-CM | POA: Diagnosis not present

## 2017-02-10 DIAGNOSIS — M6281 Muscle weakness (generalized): Secondary | ICD-10-CM | POA: Diagnosis not present

## 2017-02-10 DIAGNOSIS — J1089 Influenza due to other identified influenza virus with other manifestations: Secondary | ICD-10-CM | POA: Diagnosis not present

## 2017-02-10 DIAGNOSIS — R1311 Dysphagia, oral phase: Secondary | ICD-10-CM | POA: Diagnosis not present

## 2017-02-10 DIAGNOSIS — R278 Other lack of coordination: Secondary | ICD-10-CM | POA: Diagnosis not present

## 2017-02-13 DIAGNOSIS — I10 Essential (primary) hypertension: Secondary | ICD-10-CM | POA: Diagnosis not present

## 2017-02-13 DIAGNOSIS — E1140 Type 2 diabetes mellitus with diabetic neuropathy, unspecified: Secondary | ICD-10-CM | POA: Diagnosis not present

## 2017-02-13 DIAGNOSIS — R262 Difficulty in walking, not elsewhere classified: Secondary | ICD-10-CM | POA: Diagnosis not present

## 2017-02-13 DIAGNOSIS — R1311 Dysphagia, oral phase: Secondary | ICD-10-CM | POA: Diagnosis not present

## 2017-02-13 DIAGNOSIS — I69898 Other sequelae of other cerebrovascular disease: Secondary | ICD-10-CM | POA: Diagnosis not present

## 2017-02-13 DIAGNOSIS — M1712 Unilateral primary osteoarthritis, left knee: Secondary | ICD-10-CM | POA: Diagnosis not present

## 2017-02-13 DIAGNOSIS — J1089 Influenza due to other identified influenza virus with other manifestations: Secondary | ICD-10-CM | POA: Diagnosis not present

## 2017-02-13 DIAGNOSIS — J45909 Unspecified asthma, uncomplicated: Secondary | ICD-10-CM | POA: Diagnosis not present

## 2017-02-13 DIAGNOSIS — R296 Repeated falls: Secondary | ICD-10-CM | POA: Diagnosis not present

## 2017-02-14 DIAGNOSIS — R1311 Dysphagia, oral phase: Secondary | ICD-10-CM | POA: Diagnosis not present

## 2017-02-14 DIAGNOSIS — D649 Anemia, unspecified: Secondary | ICD-10-CM | POA: Diagnosis not present

## 2017-02-14 DIAGNOSIS — R296 Repeated falls: Secondary | ICD-10-CM | POA: Diagnosis not present

## 2017-02-14 DIAGNOSIS — I10 Essential (primary) hypertension: Secondary | ICD-10-CM | POA: Diagnosis not present

## 2017-02-14 DIAGNOSIS — E039 Hypothyroidism, unspecified: Secondary | ICD-10-CM | POA: Diagnosis not present

## 2017-02-14 DIAGNOSIS — J1089 Influenza due to other identified influenza virus with other manifestations: Secondary | ICD-10-CM | POA: Diagnosis not present

## 2017-02-14 DIAGNOSIS — I69898 Other sequelae of other cerebrovascular disease: Secondary | ICD-10-CM | POA: Diagnosis not present

## 2017-02-14 DIAGNOSIS — Z79899 Other long term (current) drug therapy: Secondary | ICD-10-CM | POA: Diagnosis not present

## 2017-02-14 DIAGNOSIS — E785 Hyperlipidemia, unspecified: Secondary | ICD-10-CM | POA: Diagnosis not present

## 2017-02-14 DIAGNOSIS — J45909 Unspecified asthma, uncomplicated: Secondary | ICD-10-CM | POA: Diagnosis not present

## 2017-02-14 DIAGNOSIS — E119 Type 2 diabetes mellitus without complications: Secondary | ICD-10-CM | POA: Diagnosis not present

## 2017-02-14 DIAGNOSIS — E559 Vitamin D deficiency, unspecified: Secondary | ICD-10-CM | POA: Diagnosis not present

## 2017-02-15 DIAGNOSIS — J45909 Unspecified asthma, uncomplicated: Secondary | ICD-10-CM | POA: Diagnosis not present

## 2017-02-15 DIAGNOSIS — F419 Anxiety disorder, unspecified: Secondary | ICD-10-CM | POA: Diagnosis not present

## 2017-02-15 DIAGNOSIS — I69898 Other sequelae of other cerebrovascular disease: Secondary | ICD-10-CM | POA: Diagnosis not present

## 2017-02-15 DIAGNOSIS — R296 Repeated falls: Secondary | ICD-10-CM | POA: Diagnosis not present

## 2017-02-15 DIAGNOSIS — R1311 Dysphagia, oral phase: Secondary | ICD-10-CM | POA: Diagnosis not present

## 2017-02-15 DIAGNOSIS — J1089 Influenza due to other identified influenza virus with other manifestations: Secondary | ICD-10-CM | POA: Diagnosis not present

## 2017-02-15 DIAGNOSIS — I10 Essential (primary) hypertension: Secondary | ICD-10-CM | POA: Diagnosis not present

## 2017-02-16 DIAGNOSIS — R1311 Dysphagia, oral phase: Secondary | ICD-10-CM | POA: Diagnosis not present

## 2017-02-16 DIAGNOSIS — J1089 Influenza due to other identified influenza virus with other manifestations: Secondary | ICD-10-CM | POA: Diagnosis not present

## 2017-02-16 DIAGNOSIS — I10 Essential (primary) hypertension: Secondary | ICD-10-CM | POA: Diagnosis not present

## 2017-02-16 DIAGNOSIS — I69898 Other sequelae of other cerebrovascular disease: Secondary | ICD-10-CM | POA: Diagnosis not present

## 2017-02-16 DIAGNOSIS — J45909 Unspecified asthma, uncomplicated: Secondary | ICD-10-CM | POA: Diagnosis not present

## 2017-02-16 DIAGNOSIS — R296 Repeated falls: Secondary | ICD-10-CM | POA: Diagnosis not present

## 2017-02-17 DIAGNOSIS — R296 Repeated falls: Secondary | ICD-10-CM | POA: Diagnosis not present

## 2017-02-17 DIAGNOSIS — I69898 Other sequelae of other cerebrovascular disease: Secondary | ICD-10-CM | POA: Diagnosis not present

## 2017-02-17 DIAGNOSIS — R1311 Dysphagia, oral phase: Secondary | ICD-10-CM | POA: Diagnosis not present

## 2017-02-17 DIAGNOSIS — J45909 Unspecified asthma, uncomplicated: Secondary | ICD-10-CM | POA: Diagnosis not present

## 2017-02-17 DIAGNOSIS — J1089 Influenza due to other identified influenza virus with other manifestations: Secondary | ICD-10-CM | POA: Diagnosis not present

## 2017-02-17 DIAGNOSIS — I10 Essential (primary) hypertension: Secondary | ICD-10-CM | POA: Diagnosis not present

## 2017-02-20 DIAGNOSIS — R296 Repeated falls: Secondary | ICD-10-CM | POA: Diagnosis not present

## 2017-02-20 DIAGNOSIS — I69898 Other sequelae of other cerebrovascular disease: Secondary | ICD-10-CM | POA: Diagnosis not present

## 2017-02-20 DIAGNOSIS — J1089 Influenza due to other identified influenza virus with other manifestations: Secondary | ICD-10-CM | POA: Diagnosis not present

## 2017-02-20 DIAGNOSIS — R1311 Dysphagia, oral phase: Secondary | ICD-10-CM | POA: Diagnosis not present

## 2017-02-20 DIAGNOSIS — J45909 Unspecified asthma, uncomplicated: Secondary | ICD-10-CM | POA: Diagnosis not present

## 2017-02-20 DIAGNOSIS — I10 Essential (primary) hypertension: Secondary | ICD-10-CM | POA: Diagnosis not present

## 2017-02-21 DIAGNOSIS — R1311 Dysphagia, oral phase: Secondary | ICD-10-CM | POA: Diagnosis not present

## 2017-02-21 DIAGNOSIS — J45909 Unspecified asthma, uncomplicated: Secondary | ICD-10-CM | POA: Diagnosis not present

## 2017-02-21 DIAGNOSIS — R296 Repeated falls: Secondary | ICD-10-CM | POA: Diagnosis not present

## 2017-02-21 DIAGNOSIS — I10 Essential (primary) hypertension: Secondary | ICD-10-CM | POA: Diagnosis not present

## 2017-02-21 DIAGNOSIS — I69898 Other sequelae of other cerebrovascular disease: Secondary | ICD-10-CM | POA: Diagnosis not present

## 2017-02-21 DIAGNOSIS — J1089 Influenza due to other identified influenza virus with other manifestations: Secondary | ICD-10-CM | POA: Diagnosis not present

## 2017-02-22 DIAGNOSIS — J45909 Unspecified asthma, uncomplicated: Secondary | ICD-10-CM | POA: Diagnosis not present

## 2017-02-22 DIAGNOSIS — R1311 Dysphagia, oral phase: Secondary | ICD-10-CM | POA: Diagnosis not present

## 2017-02-22 DIAGNOSIS — J1089 Influenza due to other identified influenza virus with other manifestations: Secondary | ICD-10-CM | POA: Diagnosis not present

## 2017-02-22 DIAGNOSIS — I10 Essential (primary) hypertension: Secondary | ICD-10-CM | POA: Diagnosis not present

## 2017-02-22 DIAGNOSIS — R296 Repeated falls: Secondary | ICD-10-CM | POA: Diagnosis not present

## 2017-02-22 DIAGNOSIS — I69898 Other sequelae of other cerebrovascular disease: Secondary | ICD-10-CM | POA: Diagnosis not present

## 2017-02-23 DIAGNOSIS — J1089 Influenza due to other identified influenza virus with other manifestations: Secondary | ICD-10-CM | POA: Diagnosis not present

## 2017-02-23 DIAGNOSIS — I10 Essential (primary) hypertension: Secondary | ICD-10-CM | POA: Diagnosis not present

## 2017-02-23 DIAGNOSIS — R296 Repeated falls: Secondary | ICD-10-CM | POA: Diagnosis not present

## 2017-02-23 DIAGNOSIS — R1311 Dysphagia, oral phase: Secondary | ICD-10-CM | POA: Diagnosis not present

## 2017-02-23 DIAGNOSIS — J45909 Unspecified asthma, uncomplicated: Secondary | ICD-10-CM | POA: Diagnosis not present

## 2017-02-23 DIAGNOSIS — I69898 Other sequelae of other cerebrovascular disease: Secondary | ICD-10-CM | POA: Diagnosis not present

## 2017-02-24 DIAGNOSIS — I10 Essential (primary) hypertension: Secondary | ICD-10-CM | POA: Diagnosis not present

## 2017-02-24 DIAGNOSIS — I69898 Other sequelae of other cerebrovascular disease: Secondary | ICD-10-CM | POA: Diagnosis not present

## 2017-02-24 DIAGNOSIS — J45909 Unspecified asthma, uncomplicated: Secondary | ICD-10-CM | POA: Diagnosis not present

## 2017-02-24 DIAGNOSIS — R1311 Dysphagia, oral phase: Secondary | ICD-10-CM | POA: Diagnosis not present

## 2017-02-24 DIAGNOSIS — J1089 Influenza due to other identified influenza virus with other manifestations: Secondary | ICD-10-CM | POA: Diagnosis not present

## 2017-02-24 DIAGNOSIS — R296 Repeated falls: Secondary | ICD-10-CM | POA: Diagnosis not present

## 2017-02-27 DIAGNOSIS — R296 Repeated falls: Secondary | ICD-10-CM | POA: Diagnosis not present

## 2017-02-27 DIAGNOSIS — I10 Essential (primary) hypertension: Secondary | ICD-10-CM | POA: Diagnosis not present

## 2017-02-27 DIAGNOSIS — J1089 Influenza due to other identified influenza virus with other manifestations: Secondary | ICD-10-CM | POA: Diagnosis not present

## 2017-02-27 DIAGNOSIS — J45909 Unspecified asthma, uncomplicated: Secondary | ICD-10-CM | POA: Diagnosis not present

## 2017-02-27 DIAGNOSIS — I69898 Other sequelae of other cerebrovascular disease: Secondary | ICD-10-CM | POA: Diagnosis not present

## 2017-02-27 DIAGNOSIS — R1311 Dysphagia, oral phase: Secondary | ICD-10-CM | POA: Diagnosis not present

## 2017-02-28 ENCOUNTER — Emergency Department (HOSPITAL_COMMUNITY): Payer: Medicare Other

## 2017-02-28 ENCOUNTER — Inpatient Hospital Stay (HOSPITAL_COMMUNITY)
Admission: EM | Admit: 2017-02-28 | Discharge: 2017-03-02 | DRG: 309 | Disposition: A | Discharge status: Skilled Nursing Facility | Payer: Medicare Other | Attending: Nephrology | Admitting: Nephrology

## 2017-02-28 ENCOUNTER — Encounter (HOSPITAL_COMMUNITY): Payer: Self-pay | Admitting: Neurology

## 2017-02-28 DIAGNOSIS — I482 Chronic atrial fibrillation: Secondary | ICD-10-CM | POA: Diagnosis not present

## 2017-02-28 DIAGNOSIS — R1030 Lower abdominal pain, unspecified: Secondary | ICD-10-CM | POA: Diagnosis not present

## 2017-02-28 DIAGNOSIS — Z794 Long term (current) use of insulin: Secondary | ICD-10-CM | POA: Diagnosis not present

## 2017-02-28 DIAGNOSIS — E119 Type 2 diabetes mellitus without complications: Secondary | ICD-10-CM | POA: Diagnosis present

## 2017-02-28 DIAGNOSIS — H547 Unspecified visual loss: Secondary | ICD-10-CM | POA: Diagnosis present

## 2017-02-28 DIAGNOSIS — T501X5A Adverse effect of loop [high-ceiling] diuretics, initial encounter: Secondary | ICD-10-CM | POA: Diagnosis present

## 2017-02-28 DIAGNOSIS — K219 Gastro-esophageal reflux disease without esophagitis: Secondary | ICD-10-CM | POA: Diagnosis present

## 2017-02-28 DIAGNOSIS — Z79899 Other long term (current) drug therapy: Secondary | ICD-10-CM

## 2017-02-28 DIAGNOSIS — I517 Cardiomegaly: Secondary | ICD-10-CM | POA: Diagnosis present

## 2017-02-28 DIAGNOSIS — I10 Essential (primary) hypertension: Secondary | ICD-10-CM | POA: Diagnosis not present

## 2017-02-28 DIAGNOSIS — I248 Other forms of acute ischemic heart disease: Secondary | ICD-10-CM | POA: Diagnosis present

## 2017-02-28 DIAGNOSIS — L299 Pruritus, unspecified: Secondary | ICD-10-CM | POA: Diagnosis present

## 2017-02-28 DIAGNOSIS — I360 Nonrheumatic tricuspid (valve) stenosis: Secondary | ICD-10-CM | POA: Diagnosis not present

## 2017-02-28 DIAGNOSIS — I6529 Occlusion and stenosis of unspecified carotid artery: Secondary | ICD-10-CM | POA: Diagnosis present

## 2017-02-28 DIAGNOSIS — E860 Dehydration: Secondary | ICD-10-CM | POA: Diagnosis present

## 2017-02-28 DIAGNOSIS — I4891 Unspecified atrial fibrillation: Secondary | ICD-10-CM | POA: Diagnosis present

## 2017-02-28 DIAGNOSIS — Z8673 Personal history of transient ischemic attack (TIA), and cerebral infarction without residual deficits: Secondary | ICD-10-CM

## 2017-02-28 DIAGNOSIS — F0390 Unspecified dementia without behavioral disturbance: Secondary | ICD-10-CM | POA: Diagnosis present

## 2017-02-28 DIAGNOSIS — M6281 Muscle weakness (generalized): Secondary | ICD-10-CM | POA: Diagnosis not present

## 2017-02-28 DIAGNOSIS — Z7901 Long term (current) use of anticoagulants: Secondary | ICD-10-CM

## 2017-02-28 DIAGNOSIS — R0789 Other chest pain: Secondary | ICD-10-CM | POA: Diagnosis not present

## 2017-02-28 DIAGNOSIS — R627 Adult failure to thrive: Secondary | ICD-10-CM | POA: Diagnosis present

## 2017-02-28 DIAGNOSIS — K5900 Constipation, unspecified: Secondary | ICD-10-CM | POA: Diagnosis not present

## 2017-02-28 DIAGNOSIS — E039 Hypothyroidism, unspecified: Secondary | ICD-10-CM | POA: Diagnosis present

## 2017-02-28 DIAGNOSIS — R000 Tachycardia, unspecified: Secondary | ICD-10-CM | POA: Diagnosis present

## 2017-02-28 HISTORY — DX: Unspecified atrial fibrillation: I48.91

## 2017-02-28 LAB — CBG MONITORING, ED: GLUCOSE-CAPILLARY: 211 mg/dL — AB (ref 65–99)

## 2017-02-28 LAB — URINALYSIS, ROUTINE W REFLEX MICROSCOPIC
BILIRUBIN URINE: NEGATIVE
Glucose, UA: 150 mg/dL — AB
Hgb urine dipstick: NEGATIVE
KETONES UR: NEGATIVE mg/dL
LEUKOCYTES UA: NEGATIVE
NITRITE: NEGATIVE
PH: 5 (ref 5.0–8.0)
Protein, ur: NEGATIVE mg/dL
SPECIFIC GRAVITY, URINE: 1.016 (ref 1.005–1.030)

## 2017-02-28 LAB — CBC
HEMATOCRIT: 31.5 % — AB (ref 36.0–46.0)
HEMOGLOBIN: 10.3 g/dL — AB (ref 12.0–15.0)
MCH: 29.2 pg (ref 26.0–34.0)
MCHC: 32.7 g/dL (ref 30.0–36.0)
MCV: 89.2 fL (ref 78.0–100.0)
Platelets: 190 10*3/uL (ref 150–400)
RBC: 3.53 MIL/uL — ABNORMAL LOW (ref 3.87–5.11)
RDW: 13.2 % (ref 11.5–15.5)
WBC: 11.1 10*3/uL — AB (ref 4.0–10.5)

## 2017-02-28 LAB — BRAIN NATRIURETIC PEPTIDE: B Natriuretic Peptide: 109.6 pg/mL — ABNORMAL HIGH (ref 0.0–100.0)

## 2017-02-28 LAB — BASIC METABOLIC PANEL
ANION GAP: 10 (ref 5–15)
BUN: 34 mg/dL — ABNORMAL HIGH (ref 6–20)
CALCIUM: 9.2 mg/dL (ref 8.9–10.3)
CO2: 26 mmol/L (ref 22–32)
Chloride: 100 mmol/L — ABNORMAL LOW (ref 101–111)
Creatinine, Ser: 1.51 mg/dL — ABNORMAL HIGH (ref 0.44–1.00)
GFR, EST AFRICAN AMERICAN: 36 mL/min — AB (ref >60)
GFR, EST NON AFRICAN AMERICAN: 31 mL/min — AB (ref >60)
Glucose, Bld: 230 mg/dL — ABNORMAL HIGH (ref 65–99)
POTASSIUM: 4.4 mmol/L (ref 3.5–5.1)
SODIUM: 136 mmol/L (ref 135–145)

## 2017-02-28 LAB — HEPATIC FUNCTION PANEL
ALBUMIN: 2.5 g/dL — AB (ref 3.5–5.0)
ALK PHOS: 97 U/L (ref 38–126)
ALT: 17 U/L (ref 14–54)
AST: 16 U/L (ref 15–41)
BILIRUBIN TOTAL: 0.4 mg/dL (ref 0.3–1.2)
Bilirubin, Direct: 0.2 mg/dL (ref 0.1–0.5)
Indirect Bilirubin: 0.2 mg/dL — ABNORMAL LOW (ref 0.3–0.9)
TOTAL PROTEIN: 6.3 g/dL — AB (ref 6.5–8.1)

## 2017-02-28 LAB — TROPONIN I
TROPONIN I: 0.03 ng/mL — AB (ref <0.03)
TROPONIN I: 0.08 ng/mL — AB (ref <0.03)
TROPONIN I: 0.03 ng/mL — AB (ref <0.03)

## 2017-02-28 LAB — MAGNESIUM: MAGNESIUM: 2.1 mg/dL (ref 1.7–2.4)

## 2017-02-28 LAB — LIPASE, BLOOD: LIPASE: 23 U/L (ref 11–51)

## 2017-02-28 LAB — GLUCOSE, CAPILLARY: Glucose-Capillary: 247 mg/dL — ABNORMAL HIGH (ref 65–99)

## 2017-02-28 MED ORDER — DILTIAZEM HCL 100 MG IV SOLR
5.0000 mg/h | INTRAVENOUS | Status: DC
  Administered 2017-03-01 (×2): 7.5 mg/h via INTRAVENOUS
  Filled 2017-02-28 (×2): qty 100

## 2017-02-28 MED ORDER — DM-GUAIFENESIN ER 30-600 MG PO TB12
1.0000 | ORAL_TABLET | Freq: Two times a day (BID) | ORAL | Status: DC
  Administered 2017-02-28 – 2017-03-02 (×4): 1 via ORAL
  Filled 2017-02-28 (×4): qty 1

## 2017-02-28 MED ORDER — FLUVOXAMINE MALEATE ER 150 MG PO CP24: 150.0000 mg | ORAL_CAPSULE | Freq: Every day | ORAL | Status: DC

## 2017-02-28 MED ORDER — SODIUM CHLORIDE 0.9 % IV SOLN
INTRAVENOUS | Status: DC
  Administered 2017-02-28 – 2017-03-01 (×3): via INTRAVENOUS
  Administered 2017-03-01: 100 mL/h via INTRAVENOUS

## 2017-02-28 MED ORDER — KETOTIFEN FUMARATE 0.025 % OP SOLN
1.0000 [drp] | Freq: Two times a day (BID) | OPHTHALMIC | Status: DC
  Administered 2017-02-28 – 2017-03-02 (×4): 1 [drp] via OPHTHALMIC
  Filled 2017-02-28: qty 5

## 2017-02-28 MED ORDER — LORATADINE 10 MG PO TABS
10.0000 mg | ORAL_TABLET | Freq: Every day | ORAL | Status: DC
  Administered 2017-03-01 – 2017-03-02 (×2): 10 mg via ORAL
  Filled 2017-02-28 (×2): qty 1

## 2017-02-28 MED ORDER — POLYVINYL ALCOHOL 1.4 % OP SOLN
1.0000 [drp] | Freq: Four times a day (QID) | OPHTHALMIC | Status: DC
  Administered 2017-02-28 – 2017-03-02 (×8): 1 [drp] via OPHTHALMIC
  Filled 2017-02-28: qty 15

## 2017-02-28 MED ORDER — HYPROMELLOSE 0.3 % OP GEL: 1.0000 "application " | Freq: Every day | OPHTHALMIC | Status: DC

## 2017-02-28 MED ORDER — GABAPENTIN 300 MG PO CAPS
300.0000 mg | ORAL_CAPSULE | Freq: Two times a day (BID) | ORAL | Status: DC
  Administered 2017-02-28 – 2017-03-02 (×4): 300 mg via ORAL
  Filled 2017-02-28 (×4): qty 1

## 2017-02-28 MED ORDER — PSEUDOEPHEDRINE-GUAIFENESIN ER 60-600 MG PO TB12: 1.0000 | ORAL_TABLET | Freq: Two times a day (BID) | ORAL | Status: DC

## 2017-02-28 MED ORDER — INSULIN ASPART 100 UNIT/ML ~~LOC~~ SOLN
0.0000 [IU] | Freq: Every day | SUBCUTANEOUS | Status: DC
  Administered 2017-02-28: 2 [IU] via SUBCUTANEOUS

## 2017-02-28 MED ORDER — PANTOPRAZOLE SODIUM 40 MG PO TBEC
40.0000 mg | DELAYED_RELEASE_TABLET | Freq: Every day | ORAL | Status: DC
Start: 2017-03-01 — End: 2017-03-02
  Administered 2017-03-01 – 2017-03-02 (×2): 40 mg via ORAL
  Filled 2017-02-28 (×2): qty 1

## 2017-02-28 MED ORDER — DIPHENHYDRAMINE HCL 25 MG PO CAPS: 25.0000 mg | ORAL_CAPSULE | Freq: Four times a day (QID) | ORAL | Status: DC | PRN

## 2017-02-28 MED ORDER — TRIAMCINOLONE ACETONIDE 0.5 % EX CREA
TOPICAL_CREAM | Freq: Three times a day (TID) | CUTANEOUS | Status: DC
  Administered 2017-02-28 – 2017-03-02 (×6): via TOPICAL
  Filled 2017-02-28: qty 15

## 2017-02-28 MED ORDER — ARTIFICIAL TEARS OPHTHALMIC OINT
TOPICAL_OINTMENT | Freq: Every day | OPHTHALMIC | Status: DC
  Administered 2017-02-28 – 2017-03-01 (×2): via OPHTHALMIC
  Filled 2017-02-28: qty 3.5

## 2017-02-28 MED ORDER — INSULIN DETEMIR 100 UNIT/ML ~~LOC~~ SOLN
28.0000 [IU] | Freq: Every day | SUBCUTANEOUS | Status: DC
  Administered 2017-02-28 – 2017-03-01 (×2): 28 [IU] via SUBCUTANEOUS
  Filled 2017-02-28 (×3): qty 0.28

## 2017-02-28 MED ORDER — SUCRALFATE 1 G PO TABS
1.0000 g | ORAL_TABLET | Freq: Four times a day (QID) | ORAL | Status: DC
  Administered 2017-03-01 – 2017-03-02 (×7): 1 g via ORAL
  Filled 2017-02-28 (×8): qty 1

## 2017-02-28 MED ORDER — NEOMYCIN-POLYMYXIN-DEXAMETH 0.1 % OP OINT
1.0000 "application " | TOPICAL_OINTMENT | Freq: Every day | OPHTHALMIC | Status: DC
  Administered 2017-02-28 – 2017-03-01 (×2): 1 via OPHTHALMIC
  Filled 2017-02-28: qty 3.5

## 2017-02-28 MED ORDER — FLUTICASONE PROPIONATE 50 MCG/ACT NA SUSP
1.0000 | Freq: Two times a day (BID) | NASAL | Status: DC
  Administered 2017-02-28 – 2017-03-02 (×4): 1 via NASAL
  Filled 2017-02-28: qty 16

## 2017-02-28 MED ORDER — ACETAMINOPHEN 325 MG PO TABS
650.0000 mg | ORAL_TABLET | ORAL | Status: DC | PRN
  Administered 2017-03-01 – 2017-03-02 (×2): 650 mg via ORAL
  Filled 2017-02-28 (×2): qty 2

## 2017-02-28 MED ORDER — INSULIN ASPART 100 UNIT/ML ~~LOC~~ SOLN
0.0000 [IU] | Freq: Three times a day (TID) | SUBCUTANEOUS | Status: DC
  Administered 2017-03-01: 3 [IU] via SUBCUTANEOUS
  Administered 2017-03-01 (×2): 1 [IU] via SUBCUTANEOUS
  Administered 2017-03-02: 3 [IU] via SUBCUTANEOUS

## 2017-02-28 MED ORDER — LEVOTHYROXINE SODIUM 75 MCG PO TABS
75.0000 ug | ORAL_TABLET | Freq: Every day | ORAL | Status: DC
  Administered 2017-03-01: 75 ug via ORAL
  Filled 2017-02-28: qty 1

## 2017-02-28 MED ORDER — DILTIAZEM HCL 100 MG IV SOLR
5.0000 mg/h | Freq: Once | INTRAVENOUS | Status: AC
  Administered 2017-02-28: 5 mg/h via INTRAVENOUS
  Filled 2017-02-28: qty 100

## 2017-02-28 MED ORDER — ACETAMINOPHEN 500 MG PO TABS: 1000.0000 mg | ORAL_TABLET | Freq: Four times a day (QID) | ORAL | Status: DC | PRN

## 2017-02-28 MED ORDER — POLYETHYLENE GLYCOL 3350 17 G PO PACK
17.0000 g | PACK | Freq: Every day | ORAL | Status: DC
  Administered 2017-03-01 – 2017-03-02 (×2): 17 g via ORAL
  Filled 2017-02-28 (×2): qty 1

## 2017-02-28 MED ORDER — ONDANSETRON HCL 4 MG/2ML IJ SOLN: 4.0000 mg | Freq: Four times a day (QID) | INTRAMUSCULAR | Status: DC | PRN

## 2017-02-28 MED ORDER — MIRTAZAPINE 15 MG PO TABS
15.0000 mg | ORAL_TABLET | Freq: Every day | ORAL | Status: DC
  Administered 2017-02-28 – 2017-03-01 (×2): 15 mg via ORAL
  Filled 2017-02-28 (×2): qty 1

## 2017-02-28 MED ORDER — RIVAROXABAN 15 MG PO TABS
15.0000 mg | ORAL_TABLET | Freq: Every day | ORAL | Status: DC
  Administered 2017-02-28 – 2017-03-02 (×3): 15 mg via ORAL
  Filled 2017-02-28 (×3): qty 1

## 2017-02-28 MED ORDER — KETOTIFEN FUMARATE 0.025 % OP SOLN: 1.0000 [drp] | Freq: Two times a day (BID) | OPHTHALMIC | Status: DC

## 2017-02-28 MED ORDER — METOPROLOL TARTRATE 5 MG/5ML IV SOLN
2.5000 mg | INTRAVENOUS | Status: DC | PRN
Start: 2017-02-28 — End: 2017-03-02
  Administered 2017-02-28 (×2): 2.5 mg via INTRAVENOUS
  Filled 2017-02-28: qty 5

## 2017-02-28 MED ORDER — DOCUSATE SODIUM 100 MG PO CAPS
100.0000 mg | ORAL_CAPSULE | Freq: Two times a day (BID) | ORAL | Status: DC
  Administered 2017-02-28 – 2017-03-02 (×4): 100 mg via ORAL
  Filled 2017-02-28 (×4): qty 1

## 2017-02-28 MED ORDER — FAMOTIDINE 20 MG PO TABS
20.0000 mg | ORAL_TABLET | Freq: Every day | ORAL | Status: DC
  Administered 2017-03-01 – 2017-03-02 (×2): 20 mg via ORAL
  Filled 2017-02-28 (×2): qty 1

## 2017-02-28 MED ORDER — FLUVOXAMINE MALEATE 50 MG PO TABS
150.0000 mg | ORAL_TABLET | Freq: Every day | ORAL | Status: DC
  Administered 2017-02-28 – 2017-03-01 (×2): 150 mg via ORAL
  Filled 2017-02-28 (×3): qty 1

## 2017-02-28 MED ORDER — DONEPEZIL HCL 10 MG PO TABS
10.0000 mg | ORAL_TABLET | Freq: Every day | ORAL | Status: DC
  Administered 2017-02-28 – 2017-03-01 (×2): 10 mg via ORAL
  Filled 2017-02-28 (×2): qty 1

## 2017-02-28 MED ORDER — MAGNESIUM HYDROXIDE 400 MG/5ML PO SUSP
30.0000 mL | Freq: Once | ORAL | Status: AC
  Administered 2017-02-28: 30 mL via ORAL
  Filled 2017-02-28: qty 30

## 2017-02-28 MED ORDER — AMLODIPINE BESYLATE 10 MG PO TABS
10.0000 mg | ORAL_TABLET | Freq: Every day | ORAL | Status: DC
  Administered 2017-02-28 – 2017-03-01 (×2): 10 mg via ORAL
  Filled 2017-02-28 (×2): qty 1

## 2017-02-28 MED ORDER — ENSURE ENLIVE PO LIQD: 237.0000 mL | Freq: Two times a day (BID) | ORAL | Status: DC

## 2017-02-28 NOTE — ED Triage Notes (Addendum)
Per ems- pt comes from [facility], c/o lower abd pain, AFIB rate 126-201 with history. Has hx of CVA with left sided deficits and facial droop. Is a x 4. Denies CP. BP 129/74, 95 RA, CBG 142. 20 gauge L. AC. Pt is also c/o lower abd pain, reports being constipated, unsure when last BM is.

## 2017-02-28 NOTE — ED Notes (Signed)
Spoke with RN at [facility], she reports patient is on xarelto, and has been getting her doses.

## 2017-02-28 NOTE — ED Notes (Signed)
Attempted report 

## 2017-02-28 NOTE — ED Notes (Signed)
Pt CBG was 211, notified Elizabeth(RN)

## 2017-02-28 NOTE — H&P (Signed)
History and Physical    Joanna Hill IPJ:[id number] DOB: 10/14/1931 DOA: 02/28/2017   PCP: Reynold Bowen, MD   Attending physician: Marily Memos  Patient coming from/Resides with: SNF/[facility]  Chief Complaint: Abdominal pain, rapid heart rate  HPI: Joanna Hill is a 83 y.o. female with medical history significant for age or fibrillation on eliquis, history of CVA, hypertension, GERD, diabetes on insulin and hypothyroidism. Patient was sent to the ER after complaining to nursing home staff of abdominal pain and found to be tachycardic. Upon arrival to the ER patient's heart rate was 129 bpm on telemetry with EKG reading of 151 bpm but she was normotensive. She was given Lopressor IV without any improvement in heart rate so was started on a low-dose Cardizem infusion. CT abdomen and pelvis only revealed moderate amount of stool in the colon.  ED Course:  Vital Signs: BP (!) 109/56   Pulse (!) 104   Temp 98.8 F (37.1 C) (Oral)   Resp (!) 31   Ht 5\' 4"  (1.626 m)   Wt 59 kg (130 lb)   SpO2 99%   BMI 22.31 kg/m  CT abdomen and pelvis: As above 2 View CXR: No infiltrate or edema Lab data: Sodium 136, potassium 4.4, chloride 100, CO2 26, glucose 2:30, BUN 34, creatinine 1.51, albumin 2.5, anion gap 10, LFTs normal, BNP 110, troponin 0.03, white count 11,100 differential not obtained, hemoglobin 10.3, platelets 190,000, urinalysis unremarkable except for 150 glucose Medications and treatments: Lopressor 2.5 mg IV 2 doses, Cardizem infusion titrated  Review of Systems:  In addition to the HPI above,  No Fever-chills, myalgias or other constitutional symptoms No Headache, changes with Vision or hearing, new weakness, tingling, numbness in any extremity, dizziness, dysarthria or word finding difficulty, gait disturbance or imbalance, tremors or seizure activity No problems swallowing food or Liquids, indigestion/reflux, no reported choking or coughing while eating No Chest pain, Cough or  Shortness of Breath, palpitations, orthopnea or DOE No N/V, melena,hematochezia, dark tarry stools No dysuria, malodorous urine, hematuria or flank pain No new skin rashes, lesions, masses or bruises, No new joint pains, aches, swelling or redness No recent unintentional weight gain or loss No polyuria, polydypsia or polyphagia   Past Medical History:  Diagnosis Date  . Atrial fibrillation ([place])   . Blindness   . Cancer ([place])    breast  . Carotid artery occlusion   . Dementia   . Diabetes mellitus   . GERD (gastroesophageal reflux disease)   . Hyperlipidemia   . Hypertension   . Hypothyroidism   . Stroke [facility]) 2009 and  2011   difficulty with swallowing    Past Surgical History:  Procedure Laterality Date  . ABDOMINAL HYSTERECTOMY    . Arch Aortogram  03-15-2010  . MASTECTOMY, PARTIAL Right   . ROTATOR CUFF REPAIR Left 1981    Social History   Social History  . Marital status: Widowed    Spouse name: N/A  . Number of children: 3  . Years of education: 1st   Occupational History  . Not on file.   Social History Main Topics  . Smoking status: Never Smoker  . Smokeless tobacco: Former Systems developer    Types: Snuff  . Alcohol use No  . Drug use: No  . Sexual activity: Not on file   Other Topics Concern  . Not on file   Social History Narrative   Patient is a resident at [facility], has 3 children   Patient  is left handed   Educational level 1st grade   Caffeine consumption is 1-2 daily    Mobility: Essentially bedbound but is assisted up to the wheelchair periodically Work history: Not obtained   No Known Allergies  Family History  Problem Relation Age of Onset  . Cancer Mother   . Heart disease Father        Heart disease before age 4  . Asthma Sister   . Diabetes Sister   . Breast cancer Sister   . Diabetes Sister   . Diabetes Sister   . Diabetes Sister   . Diabetes Sister   . Diabetes Sister   . Diabetes Sister   .  Diabetes Sister   . Diabetes Sister      Prior to Admission medications   Medication Sig Start Date End Date Taking? Authorizing Provider  acetaminophen (TYLENOL) 325 MG tablet Take 650 mg by mouth every 6 (six) hours as needed for mild pain, moderate pain, fever or headache.    Yes [provider]  antiseptic oral rinse (BIOTENE) LIQD 5 mLs by Mouth Rinse route 2 (two) times daily.   Yes [provider]  cetirizine (ZYRTEC) 10 MG tablet Take 1 tablet (10 mg total) by mouth 2 (two) times daily. 05/31/16  Yes Kozlow, Donnamarie Poag, MD  cetirizine (ZYRTEC) 10 MG tablet Take 10 mg by mouth daily.   Yes [provider]  cyproheptadine (PERIACTIN) 4 MG tablet Take one tablet twice daily 07/12/16  Yes Kozlow, Donnamarie Poag, MD  diphenhydrAMINE (BENADRYL) 25 mg capsule Take 25 mg by mouth every 6 (six) hours as needed.   Yes [provider]  docusate sodium (COLACE) 100 MG capsule Take 100 mg by mouth 2 (two) times daily.   Yes [provider]  Eyelid Cleansers (OCUSOFT LID SCRUB) PADS Apply 1 each topically 2 (two) times daily.   Yes [provider]  fluticasone (FLONASE) 50 MCG/ACT nasal spray Place 1 spray into both nostrils 2 (two) times daily.    Yes [provider]  Fluvoxamine Maleate (LUVOX CR) 150 MG CP24 Take 150 mg by mouth at bedtime.   Yes [provider]  gabapentin (NEURONTIN) 300 MG capsule Take 300 mg by mouth 2 (two) times daily.   Yes [provider]  hypromellose (SYSTANE OVERNIGHT THERAPY) 0.3 % GEL ophthalmic ointment Place 1 application into both eyes at bedtime.   Yes [provider]  insulin detemir (LEVEMIR) 100 UNIT/ML injection Inject 28 Units into the skin at bedtime.    Yes [provider]  ketotifen (ZADITOR) 0.025 % ophthalmic solution Place 1 drop into both eyes 2 (two) times daily.    Yes [provider]  ketotifen (ZADITOR) 0.025 % ophthalmic solution Place 1 drop into both  eyes 2 (two) times daily.   Yes [provider]  LEVEMIR FLEXTOUCH 100 UNIT/ML Pen Inject 28 Units into the skin daily at 10 pm.  09/15/16  Yes [provider]  levothyroxine (SYNTHROID, LEVOTHROID) 75 MCG tablet Take 75 mcg by mouth daily before breakfast.  01/03/16  Yes [provider]  loratadine (CLARITIN) 10 MG tablet Take one tablet twice a day. 09/21/16  Yes Kozlow, Donnamarie Poag, MD  metoprolol succinate (TOPROL-XL) 25 MG 24 hr tablet Take 25 mg by mouth daily.   Yes [provider]  mirtazapine (REMERON) 15 MG tablet Take 15 mg by mouth at bedtime.   Yes [provider]  neomycin-polymyxin-dexameth (MAXITROL) 0.1 % OINT Place 1 application  into both eyes at bedtime.   Yes [provider]  pantoprazole (PROTONIX) 40 MG tablet  11/21/16  Yes [provider]  pseudoephedrine-guaifenesin (MUCINEX D) 60-600 MG 12 hr tablet Take 1 tablet by mouth every 12 (twelve) hours.   Yes [provider]  pseudoephedrine-guaifenesin (MUCINEX D) 60-600 MG 12 hr tablet Take 1 tablet by mouth every 12 (twelve) hours.   Yes [provider]  ranitidine (ZANTAC) 150 MG tablet Take 150 mg by mouth 2 (two) times daily.   Yes [provider]  Rivaroxaban (XARELTO) 15 MG TABS tablet Take 15 mg by mouth daily with supper.   Yes [provider]  amLODipine (NORVASC) 10 MG tablet Take 10 mg by mouth daily.    [provider]  ARTIFICIAL TEAR OP Place 1 drop into both eyes 4 (four) times daily.    [provider]  Carboxymethylcellulose Sodium 0.25 % SOLN Apply 1 drop to eye 4 (four) times daily.    [provider]  cephALEXin (KEFLEX) 250 MG capsule  10/15/16   [provider]  donepezil (ARICEPT) 10 MG tablet Take 10 mg by mouth at bedtime.    [provider]  furosemide (LASIX) 20 MG tablet Take 40 mg by mouth 2 (two) times daily.     [provider]  Cleda Clarks 100 UNIT/ML  Mayer Masker  11/08/16   [provider]  insulin lispro (HUMALOG) 100 UNIT/ML injection Inject 7 Units into the skin daily with breakfast.     [provider]  LANTUS SOLOSTAR 100 UNIT/ML Solostar Pen  11/02/16   [provider]  metoprolol tartrate (LOPRESSOR) 25 MG tablet Take 25 mg by mouth 2 (two) times daily.    [provider]  mometasone (ELOCON) 0.1 % ointment Apply to skin once daily as directed. Patient not taking: Reported on 02/28/2017 06/14/16   Jiles Prows, MD  NOVOLOG FLEXPEN 100 UNIT/ML FlexPen  09/20/16   [provider]  OXYGEN Inhale 2 L into the lungs as needed.    [provider]  potassium chloride SA (K-DUR,KLOR-CON) 20 MEQ tablet Take 1 tablet (20 mEq total) by mouth daily. Patient not taking: Reported on 02/28/2017 01/19/14   Velna Hatchet, MD  sucralfate (CARAFATE) 1 g tablet Take 1 g by mouth 4 (four) times daily.  01/20/16   [provider]  triamcinolone cream (KENALOG) 0.1 %  11/02/16   [provider]  triamcinolone cream (KENALOG) 0.5 %  10/14/16   [provider]  UNABLE TO FIND Place 1 application into both eyes at bedtime. Vaseline Petroleum jelly    [provider]  valsartan (DIOVAN) 160 MG tablet Take 160 mg by mouth daily.    [provider]    Physical Exam: Vitals:   02/28/17 1530 02/28/17 1545 02/28/17 1600 02/28/17 1630  BP: 102/64 100/71 113/68 (!) 109/56  Pulse: (!) 105 97 99 (!) 104  Resp: 19 19 (!) 27 (!) 31  Temp:      TempSrc:      SpO2: 99% 99% 98% 99%  Weight:      Height:          Constitutional: NAD, calm, uncomfortable secondary to sacral/gluteal discomfort reported by patient due to firmness of stretcher mattress Eyes: PERRL, lids and conjunctivae normal ENMT: Mucous membranes are dry. Posterior pharynx clear of any exudate or lesions.Normal dentition.  Neck: normal, supple, no masses, no thyromegaly Respiratory: clear to auscultation  bilaterally, no wheezing, no crackles.  Normal respiratory effort. No accessory muscle use.  Cardiovascular: Regular rate and rhythm, no murmurs / rubs / gallops. No extremity edema. 2+ pedal pulses. No carotid bruits.  Abdomen: Minimal tenderness diffusely without guarding or rebounding, no masses palpated. No hepatosplenomegaly. Bowel sounds positive. Soft and nondistended Musculoskeletal: no clubbing / cyanosis. No joint deformity upper and lower extremities. Good ROM, no contractures. Normal muscle tone.  Skin: no rashes, lesions, ulcers. No induration Neurologic: CN 2-12 grossly intact. Sensation intact, DTR equivocal. Strength 4/5 x all 4 extremities subtly more weak on left. Evidence of bilateral foot drop and disuse  Psychiatric: Alert and oriented x name only. Normal mood.    Labs on Admission: I have personally reviewed following labs and imaging studies  CBC:  Recent Labs Lab 02/28/17 1120  WBC 11.1*  HGB 10.3*  HCT 31.5*  MCV 89.2  PLT 546   Basic Metabolic Panel:  Recent Labs Lab 02/28/17 1120  NA 136  K 4.4  CL 100*  CO2 26  GLUCOSE 230*  BUN 34*  CREATININE 1.51*  CALCIUM 9.2   GFR: Estimated Creatinine Clearance: 24.8 mL/min (A) (by C-G formula based on SCr of 1.51 mg/dL (H)). Liver Function Tests:  Recent Labs Lab 02/28/17 1120  AST 16  ALT 17  ALKPHOS 97  BILITOT 0.4  PROT 6.3*  ALBUMIN 2.5*    Recent Labs Lab 02/28/17 1120  LIPASE 23   No results for input(s): AMMONIA in the last 168 hours. Coagulation Profile: No results for input(s): INR, PROTIME in the last 168 hours. Cardiac Enzymes:  Recent Labs Lab 02/28/17 1120  TROPONINI 0.03*   BNP (last 3 results) No results for input(s): PROBNP in the last 8760 hours. HbA1C: No results for input(s): HGBA1C in the last 72 hours. CBG: No results for input(s): GLUCAP in the last 168 hours. Lipid Profile: No results for input(s): CHOL, HDL, LDLCALC, TRIG, CHOLHDL, LDLDIRECT in the last  72 hours. Thyroid Function Tests: No results for input(s): TSH, T4TOTAL, FREET4, T3FREE, THYROIDAB in the last 72 hours. Anemia Panel: No results for input(s): VITAMINB12, FOLATE, FERRITIN, TIBC, IRON, RETICCTPCT in the last 72 hours. Urine analysis:    Component Value Date/Time   COLORURINE YELLOW 02/28/2017 [place] 02/28/2017 1317   LABSPEC 1.016 02/28/2017 1317   PHURINE 5.0 02/28/2017 1317   GLUCOSEU 150 (A) 02/28/2017 1317   HGBUR NEGATIVE 02/28/2017 1317   [place] 02/28/2017 1317   KETONESUR NEGATIVE 02/28/2017 1317   PROTEINUR NEGATIVE 02/28/2017 1317   UROBILINOGEN 0.2 07/23/2013 1230   NITRITE NEGATIVE 02/28/2017 1317   LEUKOCYTESUR NEGATIVE 02/28/2017 1317   Sepsis Labs: @LABRCNTIP (procalcitonin:4,lacticidven:4) )No results found for this or any previous visit (from the past 240 hour(s)).   Radiological Exams on Admission: Ct Abdomen Pelvis Wo Contrast  Result Date: 02/28/2017 CLINICAL DATA:  Lower abdominal pain and tenderness with constipation for several days. EXAM: CT ABDOMEN AND PELVIS WITHOUT CONTRAST TECHNIQUE: Multidetector CT imaging of the abdomen and pelvis was performed following the standard protocol without IV contrast. COMPARISON:  None. FINDINGS: Lower chest: Minimal dependent pulmonary atelectasis. The heart is enlarged. There is aortic atherosclerosis. There is coronary artery calcification. Hepatobiliary: Normal without contrast.  No calcified gallstones. Pancreas: Normal Spleen: Normal Adrenals/Urinary Tract: Adrenal glands are normal. Renal parenchyma is normal. No hydronephrosis. There are vascular calcifications. Bladder is normal. Stomach/Bowel: No acute bowel finding. The patient does have a moderate amount of fecal matter within the colon, but within the range of considered normal.  No sign of diverticulosis or diverticulitis. No small bowel pathology. Vascular/Lymphatic: Aortic atherosclerosis. No aneurysm. IVC is normal.  No abdominal retroperitoneal lymphadenopathy. There are abnormal enlarged nodes in the right iliac and inguinal region. Inguinal node measures 15 x 25 mm. Index iliac node image 68 measures 2.1 cm in diameter. Reproductive: Previous hysterectomy.  No pelvic mass. Other: No free fluid or air. Musculoskeletal: Old appearing healed compression fractures at L2 and L4. Mild lower lumbar degenerative changes. IMPRESSION: No acute finding or finding to explain the presenting symptoms. Patient has a moderate amount of fecal matter within the colon but this would generally be considered within range of normal. Right iliac and inguinal lymphadenopathy. Inguinal node measures 15 x 25 mm. Index iliac node measures 21 mm in diameter. These are of concern and could be involved by metastatic disease or lymphoma. Electronically Signed   By: Nelson Chimes M.D.   On: 02/28/2017 14:07   Dg Chest 2 View  Result Date: 02/28/2017 CLINICAL DATA:  Atrial fibrillation. EXAM: CHEST  2 VIEW COMPARISON:  CT 01/23/2016 . FINDINGS: Mediastinum and hilar structures normal. Cardiomegaly with normal pulmonary vascularity. No focal infiltrate. No pleural effusion or pneumothorax. Degenerative changes thoracic spine with thoracic spine scoliosis. Carotid vascular calcification. IMPRESSION: 1. Mild cardiomegaly. No pulmonary venous congestion. No acute pulmonary disease. 2. Carotid vascular disease . Electronically Signed   By: Marcello Moores  Register   On: 02/28/2017 12:12    EKG: (Independently reviewed) atrial fibrillation with ventricular rate 151 bpm, QTC 451 ms, normal R-wave rotation, low-voltage R waves, no evidence of acute ischemic changes, nonspecific borderline intraventricular conduction delay  Assessment/Plan Principal Problem:   Atrial fibrillation with RVR  -Chronic atrial fibrillation now presents with rapid ventricle response secondary to abdominal pain and likely dehydration -Treat underlying causes -Continue IV Cardizem for  now -Hold home beta blocker -Continue eliquis -[id number]=6 -ECHO  Active Problems:   Constipation -Suspect is etiology to abdominal pain given CT without other acute findings -I also suspect daily Lasix usage is contributing -IV fluids -Continue Colace twice a day -Add daily MiraLAX -1 time dose MOM 30cc    Hypertension -Currently controlled -Holding Lopressor in favor of IV Cardizem -Hold Diovan -Cont Norvasc    Diabetes mellitus type 2, insulin dependent  -Continue pre-admit dose Levemir -SSI -HgbA1c     Hypothyroidism -Continue Synthroid    History of CVA in adulthood -Nonambulatory -Not on statin    Dementia -Continue Aricept and Remeron    GERD (gastroesophageal reflux disease) -Continue H2 blocker     Chronic pruritus -Followed by allergist/Kozlow in the outpatient setting -Suspect Lasix contributing -Continue preadmission symptom management medications that includes oral medications as well as topical agent as well as eyedrops    DVT prophylaxis: Eliquis Code Status: Full  Family Communication: No family at bedside  Disposition Plan: SNF Consults called: None     Cameran Pettey L. ANP-BC [facility] Pager [phone]   If 7PM-7AM, please contact night-coverage [email or web address] Password [facility]  02/28/2017, 4:44 PM

## 2017-02-28 NOTE — Progress Notes (Signed)
Upon patient admission skin assessment noted patient to have edema in ankles and swelling and redness to left index finger. Upon assessment of sacrum pt has appearance of a hole on sacrum area just below bony prominence of sacrum blanchable redness around area. Bedside Report given to oncoming RN . Mancil Pfenning, Bettina Gavia RN

## 2017-02-28 NOTE — ED Notes (Signed)
Patient transported to CT 

## 2017-02-28 NOTE — ED Provider Notes (Signed)
[facility] DEPT Provider Note   CSN: [id number] Arrival date & time: 02/28/17  1055     History   Chief Complaint Chief Complaint  Patient presents with  . Atrial Fibrillation    HPI Joanna Hill is a 83 y.o. female.  83yo F w/ PMH including A fib, T2DM, CVA, dementia who p/w abdominal pain and tachycardia. Nursing facility stated she has been complaining of intermittent lower abdominal pain this morning. Nursing home discovered that she was in a fib with RVR so they sent her to ED for evaluation. She reports that she has been constipated, she was given laxative 2 days ago with no improvement. No nausea or vomiting. Mild intermittent dysuria. She was unable to get up and walk this morning because of abdominal pain. She denies chest pain. She "sometimes" has shortness of breath.   LEVEL 5 CAVEAT DUE TO DEMENTIA   The history is provided by the patient and the nursing home.  Atrial Fibrillation     Past Medical History:  Diagnosis Date  . Atrial fibrillation ([place])   . Blindness   . Cancer ([place])    breast  . Carotid artery occlusion   . Dementia   . Diabetes mellitus   . GERD (gastroesophageal reflux disease)   . Hyperlipidemia   . Hypertension   . Hypothyroidism   . Stroke [facility]) 2009 and  2011   difficulty with swallowing    Patient Active Problem List   Diagnosis Date Noted  . Atrial fibrillation with RVR ([place]) 02/28/2017  . Hypothyroidism 02/28/2017  . Hypertension 02/28/2017  . History of CVA in adulthood 02/28/2017  . Dementia 02/28/2017  . Constipation 02/28/2017  . GERD (gastroesophageal reflux disease) 02/28/2017  . Diabetes mellitus type 2, insulin dependent ([place]) 02/28/2017  . Hyperkalemia 01/18/2014  . Occlusion and stenosis of carotid artery without mention of cerebral infarction 09/28/2011  . HYPOTENSION 08/05/2008  . DYSPHAGIA 08/05/2008  . ALTERED MENTAL STATUS 08/01/2008  . HYPERLIPIDEMIA 09/25/2007  . HYPERTENSION 09/25/2007  . CORONARY  ARTERY DISEASE 09/25/2007    Past Surgical History:  Procedure Laterality Date  . ABDOMINAL HYSTERECTOMY    . Arch Aortogram  03-15-2010  . MASTECTOMY, PARTIAL Right   . ROTATOR CUFF REPAIR Left 1981    OB History    No data available       Home Medications    Prior to Admission medications   Medication Sig Start Date End Date Taking? Authorizing Provider  acetaminophen (TYLENOL) 325 MG tablet Take 650 mg by mouth every 6 (six) hours as needed for mild pain, moderate pain, fever or headache.    Yes [provider]  antiseptic oral rinse (BIOTENE) LIQD 5 mLs by Mouth Rinse route 2 (two) times daily.   Yes [provider]  cetirizine (ZYRTEC) 10 MG tablet Take 1 tablet (10 mg total) by mouth 2 (two) times daily. 05/31/16  Yes Kozlow, Donnamarie Poag, MD  cetirizine (ZYRTEC) 10 MG tablet Take 10 mg by mouth daily.   Yes [provider]  cyproheptadine (PERIACTIN) 4 MG tablet Take one tablet twice daily 07/12/16  Yes Kozlow, Donnamarie Poag, MD  diphenhydrAMINE (BENADRYL) 25 mg capsule Take 25 mg by mouth every 6 (six) hours as needed.   Yes [provider]  docusate sodium (COLACE) 100 MG capsule Take 100 mg by mouth 2 (two) times daily.   Yes [provider]  Eyelid Cleansers (OCUSOFT LID SCRUB) PADS Apply 1 each topically 2 (two) times daily.  Yes [provider]  fluticasone (FLONASE) 50 MCG/ACT nasal spray Place 1 spray into both nostrils 2 (two) times daily.    Yes [provider]  Fluvoxamine Maleate (LUVOX CR) 150 MG CP24 Take 150 mg by mouth at bedtime.   Yes [provider]  gabapentin (NEURONTIN) 300 MG capsule Take 300 mg by mouth 2 (two) times daily.   Yes [provider]  hypromellose (SYSTANE OVERNIGHT THERAPY) 0.3 % GEL ophthalmic ointment Place 1 application into both eyes at bedtime.   Yes [provider]  insulin detemir (LEVEMIR) 100 UNIT/ML injection Inject 28 Units into the skin at bedtime.     Yes [provider]  ketotifen (ZADITOR) 0.025 % ophthalmic solution Place 1 drop into both eyes 2 (two) times daily.    Yes [provider]  ketotifen (ZADITOR) 0.025 % ophthalmic solution Place 1 drop into both eyes 2 (two) times daily.   Yes [provider]  LEVEMIR FLEXTOUCH 100 UNIT/ML Pen Inject 28 Units into the skin daily at 10 pm.  09/15/16  Yes [provider]  levothyroxine (SYNTHROID, LEVOTHROID) 75 MCG tablet Take 75 mcg by mouth daily before breakfast.  01/03/16  Yes [provider]  loratadine (CLARITIN) 10 MG tablet Take one tablet twice a day. 09/21/16  Yes Kozlow, Donnamarie Poag, MD  metoprolol succinate (TOPROL-XL) 25 MG 24 hr tablet Take 25 mg by mouth daily.   Yes [provider]  mirtazapine (REMERON) 15 MG tablet Take 15 mg by mouth at bedtime.   Yes [provider]  neomycin-polymyxin-dexameth (MAXITROL) 0.1 % OINT Place 1 application into both eyes at bedtime.   Yes [provider]  pantoprazole (PROTONIX) 40 MG tablet  11/21/16  Yes [provider]  pseudoephedrine-guaifenesin (MUCINEX D) 60-600 MG 12 hr tablet Take 1 tablet by mouth every 12 (twelve) hours.   Yes [provider]  pseudoephedrine-guaifenesin (MUCINEX D) 60-600 MG 12 hr tablet Take 1 tablet by mouth every 12 (twelve) hours.   Yes [provider]  ranitidine (ZANTAC) 150 MG tablet Take 150 mg by mouth 2 (two) times daily.   Yes [provider]  Rivaroxaban (XARELTO) 15 MG TABS tablet Take 15 mg by mouth daily with supper.   Yes [provider]  amLODipine (NORVASC) 10 MG tablet Take 10 mg by mouth daily.    [provider]  ARTIFICIAL TEAR OP Place 1 drop into both eyes 4 (four) times daily.    [provider]  Carboxymethylcellulose Sodium 0.25 % SOLN Apply 1 drop to eye 4 (four) times daily.    [provider]  cephALEXin (KEFLEX) 250 MG capsule  10/15/16   [provider]    donepezil (ARICEPT) 10 MG tablet Take 10 mg by mouth at bedtime.    [provider]  furosemide (LASIX) 20 MG tablet Take 40 mg by mouth 2 (two) times daily.     [provider]  Cleda Clarks 100 UNIT/ML Mayer Masker  11/08/16   [provider]  insulin lispro (HUMALOG) 100 UNIT/ML injection Inject 7 Units into the skin daily with breakfast.     [provider]  LANTUS SOLOSTAR 100 UNIT/ML Solostar Pen  11/02/16   [provider]  metoprolol tartrate (LOPRESSOR) 25 MG tablet Take 25 mg by mouth 2 (two) times daily.    [provider]  mometasone (ELOCON) 0.1 % ointment Apply to skin once daily as directed. Patient not taking: Reported on 02/28/2017 06/14/16  Kozlow, Donnamarie Poag, MD  NOVOLOG FLEXPEN 100 UNIT/ML FlexPen  09/20/16   [provider]  OXYGEN Inhale 2 L into the lungs as needed.    [provider]  potassium chloride SA (K-DUR,KLOR-CON) 20 MEQ tablet Take 1 tablet (20 mEq total) by mouth daily. Patient not taking: Reported on 02/28/2017 01/19/14   Velna Hatchet, MD  sucralfate (CARAFATE) 1 g tablet Take 1 g by mouth 4 (four) times daily.  01/20/16   [provider]  triamcinolone cream (KENALOG) 0.1 %  11/02/16   [provider]  triamcinolone cream (KENALOG) 0.5 %  10/14/16   [provider]  UNABLE TO FIND Place 1 application into both eyes at bedtime. Vaseline Petroleum jelly    [provider]  valsartan (DIOVAN) 160 MG tablet Take 160 mg by mouth daily.    [provider]    Family History Family History  Problem Relation Age of Onset  . Cancer Mother   . Heart disease Father        Heart disease before age 67  . Asthma Sister   . Diabetes Sister   . Breast cancer Sister   . Diabetes Sister   . Diabetes Sister   . Diabetes Sister   . Diabetes Sister   . Diabetes Sister   . Diabetes Sister   . Diabetes Sister   . Diabetes Sister     Social History Social History   Substance Use Topics  . Smoking status: Never Smoker  . Smokeless tobacco: Former Systems developer    Types: Snuff  . Alcohol use No     Allergies   Patient has no known allergies.   Review of Systems Review of Systems All other systems reviewed and are negative except that which was mentioned in HPI  Physical Exam Updated Vital Signs BP 100/71   Pulse 97   Temp 98.8 F (37.1 C) (Oral)   Resp 19   Ht 5\' 4"  (1.626 m)   Wt 59 kg (130 lb)   SpO2 99%   BMI 22.31 kg/m   Physical Exam  Constitutional: She appears well-developed. No distress.  Frail elderly woman in NAD  HENT:  Head: Normocephalic and atraumatic.  Moist mucous membranes  Eyes: Pupils are equal, round, and reactive to light. Conjunctivae are normal.  Neck: Neck supple.  Cardiovascular: Normal heart sounds.  An irregularly irregular rhythm present. Tachycardia present.   No murmur heard. Pulmonary/Chest: Effort normal and breath sounds normal.  Abdominal: Soft. Bowel sounds are normal. She exhibits no distension. There is tenderness (suprapubic). There is no rebound and no guarding.  Musculoskeletal:  Mild edema b/l hands  Neurological: She is alert.  Fluent speech, oriented to person and place  Skin: Skin is warm and dry.  Psychiatric: She has a normal mood and affect. Judgment normal.  Nursing note and vitals reviewed.    ED Treatments / Results  Labs (all labs ordered are listed, but only abnormal results are displayed) Labs Reviewed  BASIC METABOLIC PANEL - Abnormal; Notable for the following:       Result Value   Chloride 100 (*)    Glucose, Bld 230 (*)    BUN 34 (*)    Creatinine, Ser 1.51 (*)    GFR calc non Af Amer 31 (*)    GFR calc Af Amer 36 (*)    All other components within normal limits  CBC - Abnormal; Notable for the following:    WBC 11.1 (*)  RBC 3.53 (*)    Hemoglobin 10.3 (*)    HCT 31.5 (*)    All other components within normal limits  HEPATIC FUNCTION PANEL - Abnormal;  Notable for the following:    Total Protein 6.3 (*)    Albumin 2.5 (*)    Indirect Bilirubin 0.2 (*)    All other components within normal limits  TROPONIN I - Abnormal; Notable for the following:    Troponin I 0.03 (*)    All other components within normal limits  BRAIN NATRIURETIC PEPTIDE - Abnormal; Notable for the following:    B Natriuretic Peptide 109.6 (*)    All other components within normal limits  URINALYSIS, ROUTINE W REFLEX MICROSCOPIC - Abnormal; Notable for the following:    Glucose, UA 150 (*)    All other components within normal limits  LIPASE, BLOOD  HEMOGLOBIN A1C  TROPONIN I  TROPONIN I  TROPONIN I    EKG  EKG Interpretation  Date/Time:  Tuesday February 28 2017 11:04:29 EDT Ventricular Rate:  151 PR Interval:    QRS Duration: 93 QT Interval:  286 QTC Calculation: 451 R Axis:   30 Text Interpretation:  Atrial fibrillation with rapid V-rate Low voltage, precordial leads RSR' in V1 or V2, probably normal variant Repolarization abnormality, prob rate related since previous tracing, rate faster Confirmed by Theotis Burrow [phone]) on 02/28/2017 4:12:22 PM       Radiology Ct Abdomen Pelvis Wo Contrast  Result Date: 02/28/2017 CLINICAL DATA:  Lower abdominal pain and tenderness with constipation for several days. EXAM: CT ABDOMEN AND PELVIS WITHOUT CONTRAST TECHNIQUE: Multidetector CT imaging of the abdomen and pelvis was performed following the standard protocol without IV contrast. COMPARISON:  None. FINDINGS: Lower chest: Minimal dependent pulmonary atelectasis. The heart is enlarged. There is aortic atherosclerosis. There is coronary artery calcification. Hepatobiliary: Normal without contrast.  No calcified gallstones. Pancreas: Normal Spleen: Normal Adrenals/Urinary Tract: Adrenal glands are normal. Renal parenchyma is normal. No hydronephrosis. There are vascular calcifications. Bladder is normal. Stomach/Bowel: No acute bowel finding. The patient does have a  moderate amount of fecal matter within the colon, but within the range of considered normal. No sign of diverticulosis or diverticulitis. No small bowel pathology. Vascular/Lymphatic: Aortic atherosclerosis. No aneurysm. IVC is normal. No abdominal retroperitoneal lymphadenopathy. There are abnormal enlarged nodes in the right iliac and inguinal region. Inguinal node measures 15 x 25 mm. Index iliac node image 68 measures 2.1 cm in diameter. Reproductive: Previous hysterectomy.  No pelvic mass. Other: No free fluid or air. Musculoskeletal: Old appearing healed compression fractures at L2 and L4. Mild lower lumbar degenerative changes. IMPRESSION: No acute finding or finding to explain the presenting symptoms. Patient has a moderate amount of fecal matter within the colon but this would generally be considered within range of normal. Right iliac and inguinal lymphadenopathy. Inguinal node measures 15 x 25 mm. Index iliac node measures 21 mm in diameter. These are of concern and could be involved by metastatic disease or lymphoma. Electronically Signed   By: Nelson Chimes M.D.   On: 02/28/2017 14:07   Dg Chest 2 View  Result Date: 02/28/2017 CLINICAL DATA:  Atrial fibrillation. EXAM: CHEST  2 VIEW COMPARISON:  CT 01/23/2016 . FINDINGS: Mediastinum and hilar structures normal. Cardiomegaly with normal pulmonary vascularity. No focal infiltrate. No pleural effusion or pneumothorax. Degenerative changes thoracic spine with thoracic spine scoliosis. Carotid vascular calcification. IMPRESSION: 1. Mild cardiomegaly. No pulmonary venous congestion. No acute pulmonary disease. 2. Carotid vascular  disease . Electronically Signed   By: Marcello Moores  Register   On: 02/28/2017 12:12    Procedures .Critical Care Performed by: Sharlett Iles Authorized by: Sharlett Iles   Critical care provider statement:    Critical care time (minutes):  30   Critical care time was exclusive of:  Separately billable  procedures and treating other patients   Critical care was necessary to treat or prevent imminent or life-threatening deterioration of the following conditions:  Cardiac failure   Critical care was time spent personally by me on the following activities:  Development of treatment plan with patient or surrogate, evaluation of patient's response to treatment, examination of patient, obtaining history from patient or surrogate, ordering and performing treatments and interventions, ordering and review of laboratory studies, ordering and review of radiographic studies and re-evaluation of patient's condition   (including critical care time)  Medications Ordered in ED Medications  metoprolol tartrate (LOPRESSOR) injection 2.5 mg (2.5 mg Intravenous Given 02/28/17 1232)  0.9 %  sodium chloride infusion (not administered)  insulin aspart (novoLOG) injection 0-9 Units (not administered)  insulin aspart (novoLOG) injection 0-5 Units (not administered)  diltiazem (CARDIZEM) 100 mg in dextrose 5 % 100 mL (1 mg/mL) infusion (7.5 mg/hr Intravenous Rate/Dose Change 02/28/17 1511)     Initial Impression / Assessment and Plan / ED Course  I have reviewed the triage vital signs and the nursing notes.  Pertinent labs & imaging results that were available during my care of the patient were reviewed by me and considered in my medical decision making (see chart for details).     Pt brought in w/ lower abdominal pain and A fib w/ RVR, h/o A fib and currently anticoagulated. She was nontoxic on exam with reassuring BP, afebrile. Heart rate was in the 130s, atrial fibrillation with RVR on EKG. No acute ischemic changes. She denies any chest pain. Gave several boluses of metoprolol without effect, initiated a diltiazem drip and titrated to effect. Obtained above labs which show creatinine 1.51, slightly increased from previous, WBC 11.1, hemoglobin 10.3, troponin 0.03, reassuring LFTs and lipase. Because of her abdominal  tenderness and history of dementia, obtained CT to evaluate for acute abdominal process. CT showed mild constipation but no other acute findings. Because of A. fib with RVR requiring diltiazem drip, discussed admission with hospitalist, Ebony Hail. Patient admitted for further care.  Final Clinical Impressions(s) / ED Diagnoses   Final diagnoses:  None    New Prescriptions New Prescriptions   No medications on file     Lesslie Mckeehan, Wenda Overland, MD 02/28/17 1616

## 2017-03-01 ENCOUNTER — Encounter (HOSPITAL_COMMUNITY): Payer: Self-pay | Admitting: Cardiology

## 2017-03-01 ENCOUNTER — Inpatient Hospital Stay (HOSPITAL_COMMUNITY): Payer: Medicare Other

## 2017-03-01 DIAGNOSIS — Z794 Long term (current) use of insulin: Secondary | ICD-10-CM

## 2017-03-01 DIAGNOSIS — E119 Type 2 diabetes mellitus without complications: Secondary | ICD-10-CM

## 2017-03-01 DIAGNOSIS — F0390 Unspecified dementia without behavioral disturbance: Secondary | ICD-10-CM

## 2017-03-01 DIAGNOSIS — K5900 Constipation, unspecified: Secondary | ICD-10-CM

## 2017-03-01 DIAGNOSIS — I4891 Unspecified atrial fibrillation: Secondary | ICD-10-CM

## 2017-03-01 LAB — MRSA PCR SCREENING: MRSA BY PCR: NEGATIVE

## 2017-03-01 LAB — GLUCOSE, CAPILLARY
GLUCOSE-CAPILLARY: 223 mg/dL — AB (ref 65–99)
Glucose-Capillary: 135 mg/dL — ABNORMAL HIGH (ref 65–99)
Glucose-Capillary: 149 mg/dL — ABNORMAL HIGH (ref 65–99)
Glucose-Capillary: 188 mg/dL — ABNORMAL HIGH (ref 65–99)

## 2017-03-01 LAB — HEMOGLOBIN A1C
Hgb A1c MFr Bld: 10.6 % — ABNORMAL HIGH (ref 4.8–5.6)
MEAN PLASMA GLUCOSE: 258 mg/dL

## 2017-03-01 LAB — CBC
HCT: 28.9 % — ABNORMAL LOW (ref 36.0–46.0)
Hemoglobin: 9.2 g/dL — ABNORMAL LOW (ref 12.0–15.0)
MCH: 28.8 pg (ref 26.0–34.0)
MCHC: 31.8 g/dL (ref 30.0–36.0)
MCV: 90.3 fL (ref 78.0–100.0)
PLATELETS: 158 10*3/uL (ref 150–400)
RBC: 3.2 MIL/uL — ABNORMAL LOW (ref 3.87–5.11)
RDW: 13.6 % (ref 11.5–15.5)
WBC: 7.4 10*3/uL (ref 4.0–10.5)

## 2017-03-01 LAB — BASIC METABOLIC PANEL
Anion gap: 7 (ref 5–15)
BUN: 32 mg/dL — ABNORMAL HIGH (ref 6–20)
CO2: 25 mmol/L (ref 22–32)
Calcium: 8.7 mg/dL — ABNORMAL LOW (ref 8.9–10.3)
Chloride: 104 mmol/L (ref 101–111)
Creatinine, Ser: 1.13 mg/dL — ABNORMAL HIGH (ref 0.44–1.00)
GFR calc Af Amer: 51 mL/min — ABNORMAL LOW (ref >60)
GFR calc non Af Amer: 44 mL/min — ABNORMAL LOW (ref >60)
Glucose, Bld: 282 mg/dL — ABNORMAL HIGH (ref 65–99)
Potassium: 4.1 mmol/L (ref 3.5–5.1)
Sodium: 136 mmol/L (ref 135–145)

## 2017-03-01 LAB — TROPONIN I: TROPONIN I: 0.03 ng/mL — AB (ref <0.03)

## 2017-03-01 LAB — TSH: TSH: 14.859 u[IU]/mL — ABNORMAL HIGH (ref 0.350–4.500)

## 2017-03-01 MED ORDER — ENSURE ENLIVE PO LIQD
237.0000 mL | Freq: Three times a day (TID) | ORAL | Status: DC
  Administered 2017-03-02: 237 mL via ORAL

## 2017-03-01 MED ORDER — DILTIAZEM HCL 30 MG PO TABS
30.0000 mg | ORAL_TABLET | Freq: Four times a day (QID) | ORAL | Status: DC
  Administered 2017-03-01 – 2017-03-02 (×3): 30 mg via ORAL
  Filled 2017-03-01 (×3): qty 1

## 2017-03-01 NOTE — Progress Notes (Signed)
Assessed the patient's sacrum. The area appears to be a "hole" however the skin is not broken. Patient had been repositioned and the area had minimum redness and was blanchable. Asked the patient was she aware of the area and she said yes. Stated that it has been there for a "long time" and that it does not hurt. I did ask her if it had been there since she was a child and said it was not. However, it just appeared as she got older.  Bard Herbert RN WTA

## 2017-03-01 NOTE — Consult Note (Signed)
Cardiology Consultation:   Patient ID: Joanna Hill; [id number]; Nov 20, 1931   Admit date: 02/28/2017 Date of Consult: 03/01/2017  Primary Care Provider: System, Pcp Not In Primary Cardiologist: New  (Dr. Jenell Milliner -2007)  Primary Electrophysiologist:  None   Patient Profile:   Joanna Hill is a 83 y.o. female with a hx of atrial fibrillation (xarelto), blindness, breast cancer, carotid stenosis, dementia, diabetes, GERD hyperlipidemia, hypothyroidism, stroke.  The patient is being seen today for the evaluation of atrial fibrillation with RVR at the request of Dr. Carolin Sicks.  History of Present Illness:   Joanna Hill has not been seen by the cardiology group since 2007. She had an admission in 2015 which noted her to be in afib rate controlled and on Xarelto.  She was sent the the Emergency Department from the nursing home with abdominal pain and tachycardia. Rate noted to be 120-130s on arrival. She was started on Cardizem infusion. CT abd/pelv showed constipation, neg urinalysis. No N/V/D.  Chest xray shows cardiomegaly with normal pulmonary vascularity and carotid vascular calcification. BNP is 109.6.   Troponin 0.03, 0.08, 0.03, 0.03.    BUN and creatinine are elevated 32/ 1.13 but this appears to be her baseline. Potassium and magnesium are WNL. She is on Norvasc 10 mg PO,  7.5  Cardizem drip, and has received 2 doses of 2.5 mg Lopressor yesterday.   My interview with the patient is limited because of her dementia. She cannot remember if she has had Afib with a fast rate before. She denies having had any chest pain or shortness of breath to me. She currently is in rate controlled afib and overall reports feeling better. She has been living in [facility] for the last 14 years because "she has nowhere else to go". No source of infection found, she is frail, deconditioned but non toxic appearing.  Past Medical History:  Diagnosis Date  . Atrial fibrillation ([place])   . Atrial  fibrillation with RVR ([place]) 02/28/2017  . Blindness   . Cancer ([place])    breast  . Carotid artery occlusion   . Dementia   . Diabetes mellitus   . GERD (gastroesophageal reflux disease)   . Hyperlipidemia   . Hypertension   . Hypothyroidism   . Stroke [facility]) 2009 and  2011   difficulty with swallowing    Past Surgical History:  Procedure Laterality Date  . ABDOMINAL HYSTERECTOMY    . Arch Aortogram  03-15-2010  . MASTECTOMY, PARTIAL Right   . ROTATOR CUFF REPAIR Left 1981     Inpatient Medications: Scheduled Meds: . amLODipine  10 mg Oral Daily  . artificial tears   Both Eyes QHS  . dextromethorphan-guaiFENesin  1 tablet Oral BID  . docusate sodium  100 mg Oral BID  . donepezil  10 mg Oral QHS  . famotidine  20 mg Oral Daily  . feeding supplement (ENSURE ENLIVE)  237 mL Oral BID BM  . fluticasone  1 spray Each Nare BID  . fluvoxaMINE  150 mg Oral QHS  . gabapentin  300 mg Oral BID  . insulin aspart  0-5 Units Subcutaneous QHS  . insulin aspart  0-9 Units Subcutaneous TID WC  . insulin detemir  28 Units Subcutaneous Q2200  . ketotifen  1 drop Both Eyes BID  . levothyroxine  75 mcg Oral QAC breakfast  . loratadine  10 mg Oral Daily  . mirtazapine  15 mg Oral QHS  . neomycin-polymyxin-dexameth  1 application Both Eyes  QHS  . pantoprazole  40 mg Oral Daily  . polyethylene glycol  17 g Oral Daily  . polyvinyl alcohol  1 drop Both Eyes QID  . Rivaroxaban  15 mg Oral Q supper  . sucralfate  1 g Oral QID  . triamcinolone cream   Topical TID   Continuous Infusions: . sodium chloride 100 mL/hr at 03/01/17 0410  . diltiazem (CARDIZEM) infusion 7.5 mg/hr (03/01/17 0042)   PRN Meds: acetaminophen, diphenhydrAMINE, metoprolol tartrate, ondansetron (ZOFRAN) IV  Allergies:   No Known Allergies  Social History:   Social History   Social History  . Marital status: Widowed    Spouse name: N/A  . Number of children: 3  . Years of education: 1st   Occupational History  .  Not on file.   Social History Main Topics  . Smoking status: Never Smoker  . Smokeless tobacco: Former Systems developer    Types: Snuff  . Alcohol use No  . Drug use: No  . Sexual activity: Not on file   Other Topics Concern  . Not on file   Social History Narrative   Patient is a resident at [facility], has 3 children   Patient is left handed   Educational level 1st grade   Caffeine consumption is 1-2 daily      Family History:  The patient's family history includes Asthma in her sister; Breast cancer in her sister; Cancer in her mother; Diabetes in her sister, sister, sister, sister, sister, sister, sister, sister, and sister; Heart disease in her father.  ROS:  Please see the history of present illness.  All other ROS reviewed and negative.     Physical Exam/Data:   Vitals:   03/01/17 0321 03/01/17 0400 03/01/17 0800 03/01/17 1200  BP:  (!) 120/55 114/66 110/60  Pulse: 91 96 93 86  Resp: (!) 0 (!) 25 19 12   Temp: 97.6 F (36.4 C)  98.5 F (36.9 C) 98.7 F (37.1 C)  TempSrc:   Axillary Oral  SpO2: 95% 94% 96% 100%  Weight:      Height:        Intake/Output Summary (Last 24 hours) at 03/01/17 1249 Last data filed at 03/01/17 1221  Gross per 24 hour  Intake          1434.21 ml  Output              812 ml  Net           622.21 ml   Filed Weights   02/28/17 1102  Weight: 130 lb (59 kg)   Body mass index is 22.31 kg/m.  General: deconditioned and frail Head: Normocephalic, atraumatic, Neck:  . JVD not elevated. Lungs: Clear bilaterally to auscultation without wheezes, rales, or rhonchi. Breathing is unlabored. Heart: irreg/irreg  Abdomen: Soft, non-tender, non-distended . Extremities: No clubbing or cyanosis. Mild le edema Neuro: non focal Psych:  Normal affect  EKG:  The EKG was personally reviewed and demonstrates atrial fibrillations, rates 150s  Relevant CV Studies:  None  Laboratory Data:  Chemistry Recent Labs Lab  02/28/17 1120 03/01/17 0443  NA 136 136  K 4.4 4.1  CL 100* 104  CO2 26 25  GLUCOSE 230* 282*  BUN 34* 32*  CREATININE 1.51* 1.13*  CALCIUM 9.2 8.7*  GFRNONAA 31* 44*  GFRAA 36* 51*  ANIONGAP 10 7     Recent Labs Lab 02/28/17 1120  PROT 6.3*  ALBUMIN 2.5*  AST 16  ALT 17  ALKPHOS 97  BILITOT 0.4   Hematology Recent Labs Lab 02/28/17 1120 03/01/17 0443  WBC 11.1* 7.4  RBC 3.53* 3.20*  HGB 10.3* 9.2*  HCT 31.5* 28.9*  MCV 89.2 90.3  MCH 29.2 28.8  MCHC 32.7 31.8  RDW 13.2 13.6  PLT 190 158   Cardiac Enzymes Recent Labs Lab 02/28/17 1120 02/28/17 1626 02/28/17 2149 03/01/17 0443  TROPONINI 0.03* 0.08* 0.03* 0.03*   No results for input(s): TROPIPOC in the last 168 hours.  BNP Recent Labs Lab 02/28/17 1120  BNP 109.6*    DDimer No results for input(s): DDIMER in the last 168 hours.  Radiology/Studies:  Ct Abdomen Pelvis Wo Contrast  Result Date: 02/28/2017 CLINICAL DATA:  Lower abdominal pain and tenderness with constipation for several days. EXAM: CT ABDOMEN AND PELVIS WITHOUT CONTRAST TECHNIQUE: Multidetector CT imaging of the abdomen and pelvis was performed following the standard protocol without IV contrast. COMPARISON:  None. FINDINGS: Lower chest: Minimal dependent pulmonary atelectasis. The heart is enlarged. There is aortic atherosclerosis. There is coronary artery calcification. Hepatobiliary: Normal without contrast.  No calcified gallstones. Pancreas: Normal Spleen: Normal Adrenals/Urinary Tract: Adrenal glands are normal. Renal parenchyma is normal. No hydronephrosis. There are vascular calcifications. Bladder is normal. Stomach/Bowel: No acute bowel finding. The patient does have a moderate amount of fecal matter within the colon, but within the range of considered normal. No sign of diverticulosis or diverticulitis. No small bowel pathology. Vascular/Lymphatic: Aortic atherosclerosis. No aneurysm. IVC is normal. No abdominal retroperitoneal  lymphadenopathy. There are abnormal enlarged nodes in the right iliac and inguinal region. Inguinal node measures 15 x 25 mm. Index iliac node image 68 measures 2.1 cm in diameter. Reproductive: Previous hysterectomy.  No pelvic mass. Other: No free fluid or air. Musculoskeletal: Old appearing healed compression fractures at L2 and L4. Mild lower lumbar degenerative changes. IMPRESSION: No acute finding or finding to explain the presenting symptoms. Patient has a moderate amount of fecal matter within the colon but this would generally be considered within range of normal. Right iliac and inguinal lymphadenopathy. Inguinal node measures 15 x 25 mm. Index iliac node measures 21 mm in diameter. These are of concern and could be involved by metastatic disease or lymphoma. Electronically Signed   By: Nelson Chimes M.D.   On: 02/28/2017 14:07   Dg Chest 2 View  Result Date: 02/28/2017 CLINICAL DATA:  Atrial fibrillation. EXAM: CHEST  2 VIEW COMPARISON:  CT 01/23/2016 . FINDINGS: Mediastinum and hilar structures normal. Cardiomegaly with normal pulmonary vascularity. No focal infiltrate. No pleural effusion or pneumothorax. Degenerative changes thoracic spine with thoracic spine scoliosis. Carotid vascular calcification. IMPRESSION: 1. Mild cardiomegaly. No pulmonary venous congestion. No acute pulmonary disease. 2. Carotid vascular disease . Electronically Signed   By: [place]   On: 02/28/2017 12:12    Assessment and Plan:   1. Atrial fibrillation with RVR: hx of permanent atrial fibrillation on longer term anticoagulation (Xarelto)  CHADVASC score is 6 (age/2, stroke/2, female/1, diabetes/1).  Her rate has been managed on a Cardizem drip. Her blood pressure is stable at 110/60. Would like to either convert her to PO Cardizem when rate remains stable or schedule low dose lopressor, will discuss rate control medication choice with attending..  -- No source of infection found but she has been having  abdominal pain with constipation as well as urinary retention requiring in and out foley to decompress. -- No previous Echocardiogram found in Epic or Care Everywhere.  2. Elevated Troponin: Troponin  was slightly elevated and has returned back down to baseline. She did not have any chest pain, although she has had some abdominal pain with associated constipation and urinary retention.   Kristopher Glee, PA-C  03/01/2017 12:49 PM   History and all data above reviewed.  The patient has dementia.  However, her granddaughter who is a nurse was in the room and quite helpful. The patient lives in a nursing home and seems to have had a progressive failure to thrive.  She has had weakness and spends most of her time in her wheelchair.  There apparently has not been a clear etiology.  She was admitted with abdominal pain.  We are asked to consult because of chronic atrial fib now with rapid rate.  She takes blood thinner.  She denies any pain and has no SOB.  She denies palpitations.  Her rate has been relatively easily controlled   Patient examined.  I agree with the findings as above.  The patient exam reveals JHE:[id number]  ,  Lungs: Clear  ,  Abd: Positive bowel sounds, no rebound no guarding, Ext No edema  .  All available labs, radiology testing, previous records reviewed. Agree with documented assessment and plan. Atrial fib:  Her BP has been falling at the NH.  I will stop the IV Cardizem and start PO.  I will stop Norvasc.  Continue anticoagulation.  Check TSH.  I suspect we can control HR and BP on Cardizem CD eventually.  Elevated troponin:  I think this is secondary and would not suggest further work up other than the echo ordered as above.  Failure to thrive: Work up per primary team.     Minus Breeding  3:07 PM  03/01/2017

## 2017-03-01 NOTE — Progress Notes (Signed)
Initial Nutrition Assessment  DOCUMENTATION CODES:   Non-severe (moderate) malnutrition in context of chronic illness  INTERVENTION:    Ensure Enlive po TID, each supplement provides 350 kcal and 20 grams of protein  NUTRITION DIAGNOSIS:   Malnutrition (moderate) related to chronic illness (dementia, difficulty swallowing, DM) as evidenced by mild depletion of body fat, mild depletion of muscle mass, mild fluid accumulation.  GOAL:   Patient will meet greater than or equal to 90% of their needs  MONITOR:   PO intake, Supplement acceptance, Labs  REASON FOR ASSESSMENT:   Malnutrition Screening Tool    ASSESSMENT:   83 yo female with PMH of DM, HTN, HLD, breast CA, stroke, difficulty swallowing, dementia, hypothyroidism, GERD, carotid artery occlusion who was admitted pn 7/17 with A fib with RVR, constipation, and overdiuresis.  Patient c/o constipation and poor oral intake. Endorses weight loss. Agreed to receive Ensure Enlive supplements.  Nutrition-Focused physical exam completed. Findings are mild-moderate fat depletion, mild-moderate muscle depletion, and mild edema.   18% weight loss within the past year.  Labs reviewed.  CBG's: 223-135 Medications reviewed and include Colace, Pepcid, Remeron, Miralax,   Diet Order:  Diet heart healthy/carb modified Room service appropriate? Yes; Fluid consistency: Thin PO's: 0-30%  Skin:  Reviewed, no issues  Last BM:  unknown  Height:   Ht Readings from Last 1 Encounters:  02/28/17 5\' 4"  (1.626 m)    Weight:   Wt Readings from Last 1 Encounters:  02/28/17 130 lb (59 kg)    Ideal Body Weight:  54.5 kg  BMI:  Body mass index is 22.31 kg/m.  Estimated Nutritional Needs:   Kcal:  1400-1600  Protein:  70-85 gm  Fluid:  >/= 1.5 L  EDUCATION NEEDS:   No education needs identified at this time  Molli Barrows, [place], [place], [place] Pager [phone] After Hours Pager [phone]

## 2017-03-01 NOTE — Progress Notes (Signed)
Pt noted to have no urine output since beginning of shift. Pt endorses slight abdominal discomfort when pressing on lower quadrants, denies urge to urinate. Bladder scanned and showed 570 mLs. On-call provider notified, verbal order given for in and out cath x1. 860mLs of dark urine returned. Peri care provided and purewick replaced. Will continue to closely monitor pt.  Jaymes Graff, RN

## 2017-03-01 NOTE — Progress Notes (Signed)
   Introduced chaplaincy services.  Will follow, as needed.  

## 2017-03-01 NOTE — Progress Notes (Signed)
PROGRESS NOTE    Joanna Hill  ESP:[id number] DOB: [birth year] DOA: 02/28/2017 PCP: System, Pcp Not In   Brief Narrative: 83 year old female with history of chronic atrial fibrillation on xarelto, stroke, hypertension, GERD, diabetes, hypothyroidism sent from nursing home for abdominal pain and tachycardia. In the ER patient was found to have A. fib with RVR started on Cardizem drip and admitted for further evaluation.  Assessment & Plan:  #  Atrial fibrillation with RVR: Required IV Cardizem with heart rate controlled. Switching to oral diltiazem by cardiologist. Holding beta blocker. Continue xarelto for anticoagulation. Follow-up echocardiogram and cardiologist planned. Patient denied chest pain or shortness of breath. -Follow-up TSH -Mildly elevated troponin likely in the setting of RVR.  #Constipation: Abdomen exam benign. Continue Colace, MiraLAX  #Hypertension: Monitor blood pressure. DC Norvasc by cardiologist. On oral Cardizem. Monitor blood pressure  #Type 2 diabetes, insulin-dependent: Continue current insulin regimen. Monitor blood sugar level  #Hypothyroidism: Follow-up TSH. Continue Synthroid.  #Dementia without behavioral disturbance, exact type unspecified: Continue Aricept, Remeron  #GERD: Continue Protonix  Principal Problem:   Atrial fibrillation with RVR ([facility]) Active Problems:   Hypothyroidism   Hypertension   History of CVA in adulthood   Dementia   Constipation   GERD (gastroesophageal reflux disease)   Diabetes mellitus type 2, insulin dependent ([place])  DVT prophylaxis: xarelto Code Status:full code Family Communication: Disposition Plan: Likely discharge to skilled facility in 1-2 days. Social worker consult    Consultants:   Cardiologist  Procedures: Pending echo Antimicrobials: None  Subjective: Seen and examined at bedside. Denied headache, dizziness, nausea vomiting chest pain or shortness of breath.  Objective: Vitals:   03/01/17  0321 03/01/17 0400 03/01/17 0800 03/01/17 1200  BP:  (!) 120/55 114/66 110/60  Pulse: 91 96 93 86  Resp: (!) 0 (!) 25 19 12   Temp: 97.6 F (36.4 C)  98.5 F (36.9 C) 98.7 F (37.1 C)  TempSrc:   Axillary Oral  SpO2: 95% 94% 96% 100%  Weight:      Height:        Intake/Output Summary (Last 24 hours) at 03/01/17 1612 Last data filed at 03/01/17 1221  Gross per 24 hour  Intake          1434.21 ml  Output              812 ml  Net           622.21 ml   Filed Weights   02/28/17 1102  Weight: 59 kg (130 lb)    Examination:  General exam: Appears calm and comfortable  Respiratory system: Clear to auscultation. Respiratory effort normal. No wheezing or crackle Cardiovascular system: S1 & S2 heard, Irregularly irregular.  No pedal edema. Gastrointestinal system: Abdomen is nondistended, soft and nontender. Normal bowel sounds heard. Central nervous system: Alert awake and following commands. No focal neurological deficits. Extremities: Symmetric 5 x 5 power. Skin: No rashes, lesions or ulcers      Data Reviewed: I have personally reviewed following labs and imaging studies  CBC:  Recent Labs Lab 02/28/17 1120 03/01/17 0443  WBC 11.1* 7.4  HGB 10.3* 9.2*  HCT 31.5* 28.9*  MCV 89.2 90.3  PLT 190 633   Basic Metabolic Panel:  Recent Labs Lab 02/28/17 1120 02/28/17 2149 03/01/17 0443  NA 136  --  136  K 4.4  --  4.1  CL 100*  --  104  CO2 26  --  25  GLUCOSE 230*  --  282*  BUN 34*  --  32*  CREATININE 1.51*  --  1.13*  CALCIUM 9.2  --  8.7*  MG  --  2.1  --    GFR: Estimated Creatinine Clearance: 33.1 mL/min (A) (by C-G formula based on SCr of 1.13 mg/dL (H)). Liver Function Tests:  Recent Labs Lab 02/28/17 1120  AST 16  ALT 17  ALKPHOS 97  BILITOT 0.4  PROT 6.3*  ALBUMIN 2.5*    Recent Labs Lab 02/28/17 1120  LIPASE 23   No results for input(s): AMMONIA in the last 168 hours. Coagulation Profile: No results for input(s): INR, PROTIME in  the last 168 hours. Cardiac Enzymes:  Recent Labs Lab 02/28/17 1120 02/28/17 1626 02/28/17 2149 03/01/17 0443  TROPONINI 0.03* 0.08* 0.03* 0.03*   BNP (last 3 results) No results for input(s): PROBNP in the last 8760 hours. HbA1C:  Recent Labs  02/28/17 1626  HGBA1C 10.6*   CBG:  Recent Labs Lab 02/28/17 1658 02/28/17 2139 03/01/17 0605 03/01/17 1146  GLUCAP 211* 247* 223* 135*   Lipid Profile: No results for input(s): CHOL, HDL, LDLCALC, TRIG, CHOLHDL, LDLDIRECT in the last 72 hours. Thyroid Function Tests: No results for input(s): TSH, T4TOTAL, FREET4, T3FREE, THYROIDAB in the last 72 hours. Anemia Panel: No results for input(s): VITAMINB12, FOLATE, FERRITIN, TIBC, IRON, RETICCTPCT in the last 72 hours. Sepsis Labs: No results for input(s): PROCALCITON, LATICACIDVEN in the last 168 hours.  Recent Results (from the past 240 hour(s))  MRSA PCR Screening     Status: None   Collection Time: 02/28/17 11:44 PM  Result Value Ref Range Status   MRSA by PCR NEGATIVE NEGATIVE Final    Comment:        The GeneXpert MRSA Assay (FDA approved for NASAL specimens only), is one component of a comprehensive MRSA colonization surveillance program. It is not intended to diagnose MRSA infection nor to guide or monitor treatment for MRSA infections.          Radiology Studies: Ct Abdomen Pelvis Wo Contrast  Result Date: 02/28/2017 CLINICAL DATA:  Lower abdominal pain and tenderness with constipation for several days. EXAM: CT ABDOMEN AND PELVIS WITHOUT CONTRAST TECHNIQUE: Multidetector CT imaging of the abdomen and pelvis was performed following the standard protocol without IV contrast. COMPARISON:  None. FINDINGS: Lower chest: Minimal dependent pulmonary atelectasis. The heart is enlarged. There is aortic atherosclerosis. There is coronary artery calcification. Hepatobiliary: Normal without contrast.  No calcified gallstones. Pancreas: Normal Spleen: Normal  Adrenals/Urinary Tract: Adrenal glands are normal. Renal parenchyma is normal. No hydronephrosis. There are vascular calcifications. Bladder is normal. Stomach/Bowel: No acute bowel finding. The patient does have a moderate amount of fecal matter within the colon, but within the range of considered normal. No sign of diverticulosis or diverticulitis. No small bowel pathology. Vascular/Lymphatic: Aortic atherosclerosis. No aneurysm. IVC is normal. No abdominal retroperitoneal lymphadenopathy. There are abnormal enlarged nodes in the right iliac and inguinal region. Inguinal node measures 15 x 25 mm. Index iliac node image 68 measures 2.1 cm in diameter. Reproductive: Previous hysterectomy.  No pelvic mass. Other: No free fluid or air. Musculoskeletal: Old appearing healed compression fractures at L2 and L4. Mild lower lumbar degenerative changes. IMPRESSION: No acute finding or finding to explain the presenting symptoms. Patient has a moderate amount of fecal matter within the colon but this would generally be considered within range of normal. Right iliac and inguinal lymphadenopathy. Inguinal node measures 15 x 25 mm. Index iliac node measures 21  mm in diameter. These are of concern and could be involved by metastatic disease or lymphoma. Electronically Signed   By: Nelson Chimes M.D.   On: 02/28/2017 14:07   Dg Chest 2 View  Result Date: 02/28/2017 CLINICAL DATA:  Atrial fibrillation. EXAM: CHEST  2 VIEW COMPARISON:  CT 01/23/2016 . FINDINGS: Mediastinum and hilar structures normal. Cardiomegaly with normal pulmonary vascularity. No focal infiltrate. No pleural effusion or pneumothorax. Degenerative changes thoracic spine with thoracic spine scoliosis. Carotid vascular calcification. IMPRESSION: 1. Mild cardiomegaly. No pulmonary venous congestion. No acute pulmonary disease. 2. Carotid vascular disease . Electronically Signed   By: [place]   On: 02/28/2017 12:12        Scheduled Meds: .  artificial tears   Both Eyes QHS  . dextromethorphan-guaiFENesin  1 tablet Oral BID  . diltiazem  30 mg Oral Q6H  . docusate sodium  100 mg Oral BID  . donepezil  10 mg Oral QHS  . famotidine  20 mg Oral Daily  . feeding supplement (ENSURE ENLIVE)  237 mL Oral TID BM  . fluticasone  1 spray Each Nare BID  . fluvoxaMINE  150 mg Oral QHS  . gabapentin  300 mg Oral BID  . insulin aspart  0-5 Units Subcutaneous QHS  . insulin aspart  0-9 Units Subcutaneous TID WC  . insulin detemir  28 Units Subcutaneous Q2200  . ketotifen  1 drop Both Eyes BID  . levothyroxine  75 mcg Oral QAC breakfast  . loratadine  10 mg Oral Daily  . mirtazapine  15 mg Oral QHS  . neomycin-polymyxin-dexameth  1 application Both Eyes QHS  . pantoprazole  40 mg Oral Daily  . polyethylene glycol  17 g Oral Daily  . polyvinyl alcohol  1 drop Both Eyes QID  . Rivaroxaban  15 mg Oral Q supper  . sucralfate  1 g Oral QID  . triamcinolone cream   Topical TID   Continuous Infusions: . sodium chloride 100 mL/hr (03/01/17 1335)     LOS: 1 day    Dron Tanna Furry, MD [facility] Pager [phone]  If 7PM-7AM, please contact night-coverage [email or web address] Password [facility] 03/01/2017, 4:12 PM

## 2017-03-02 ENCOUNTER — Inpatient Hospital Stay (HOSPITAL_COMMUNITY): Payer: Medicare Other

## 2017-03-02 DIAGNOSIS — I10 Essential (primary) hypertension: Secondary | ICD-10-CM

## 2017-03-02 DIAGNOSIS — I360 Nonrheumatic tricuspid (valve) stenosis: Secondary | ICD-10-CM

## 2017-03-02 LAB — GLUCOSE, CAPILLARY
GLUCOSE-CAPILLARY: 84 mg/dL (ref 65–99)
GLUCOSE-CAPILLARY: 113 mg/dL — AB (ref 65–99)
GLUCOSE-CAPILLARY: 204 mg/dL — AB (ref 65–99)
Glucose-Capillary: 63 mg/dL — ABNORMAL LOW (ref 65–99)

## 2017-03-02 LAB — ECHOCARDIOGRAM COMPLETE
HEIGHTINCHES: 64 in
Weight: 2518.54 oz

## 2017-03-02 MED ORDER — DILTIAZEM HCL ER COATED BEADS 120 MG PO CP24
120.0000 mg | ORAL_CAPSULE | Freq: Every day | ORAL | Status: DC
  Administered 2017-03-02: 120 mg via ORAL
  Filled 2017-03-02: qty 1

## 2017-03-02 MED ORDER — LEVEMIR FLEXTOUCH 100 UNIT/ML ~~LOC~~ SOPN: 24.0000 [IU] | PEN_INJECTOR | Freq: Every day | SUBCUTANEOUS | 0 refills | Status: AC

## 2017-03-02 MED ORDER — LEVOTHYROXINE SODIUM 100 MCG PO TABS: 100.0000 ug | ORAL_TABLET | Freq: Every day | ORAL | Status: DC

## 2017-03-02 MED ORDER — LEVOTHYROXINE SODIUM 100 MCG PO TABS: 100.0000 ug | ORAL_TABLET | Freq: Every day | ORAL | 0 refills | Status: DC

## 2017-03-02 MED ORDER — POLYETHYLENE GLYCOL 3350 17 G PO PACK: 17.0000 g | PACK | Freq: Every day | ORAL | 0 refills | Status: AC | PRN

## 2017-03-02 MED ORDER — INSULIN DETEMIR 100 UNIT/ML ~~LOC~~ SOLN
24.0000 [IU] | Freq: Every day | SUBCUTANEOUS | Status: DC
  Filled 2017-03-02: qty 0.24

## 2017-03-02 MED ORDER — DILTIAZEM HCL ER COATED BEADS 120 MG PO CP24: 120.0000 mg | ORAL_CAPSULE | Freq: Every day | ORAL | 0 refills | Status: DC

## 2017-03-02 NOTE — Clinical Social Work Placement (Signed)
   CLINICAL SOCIAL WORK PLACEMENT  NOTE  Date:  03/02/2017  Patient Details  Name: ODENA MCQUAID MRN: [id number] Date of Birth: [birth year]  Clinical Social Work is seeking post-discharge placement for this patient at the [place] level of care (*CSW will initial, date and re-position this form in  chart as items are completed):      Patient/family provided with [facility] Work Department's list of facilities offering this level of care within the geographic area requested by the patient (or if unable, by the patient's family).  Yes   Patient/family informed of their freedom to choose among providers that offer the needed level of care, that participate in Medicare, Medicaid or managed care program needed by the patient, have an available bed and are willing to accept the patient.      Patient/family informed of [facility]'s ownership interest in [facility] and [facility], as well as of the fact that they are under no obligation to receive care at these facilities.  PASRR submitted to EDS on       PASRR number received on 03/02/17     Existing PASRR number confirmed on       FL2 transmitted to all facilities in geographic area requested by pt/family on 03/02/17     FL2 transmitted to all facilities within larger geographic area on       Patient informed that his/her managed care company has contracts with or will negotiate with certain facilities, including the following:        Yes   Patient/family informed of bed offers received.  Patient chooses bed at [facility]     Physician recommends and patient chooses bed at      Patient to be transferred to [facility] on  .  Patient to be transferred to facility by PTAR     Patient family notified on   of transfer.  Name of family member notified:        PHYSICIAN       Additional Comment:    _______________________________________________ Eileen Stanford, LCSW 03/02/2017, 12:13 PM

## 2017-03-02 NOTE — Progress Notes (Signed)
CSW received a call from [place] at [facility] stating the patient has been offered a bed and has been accepted and that the pt can arrive on 03/02/17.  The pt's accepting doctor is SNF MD.  The room number will be 603.  The number for report is [phone].  CSW will update RN.  CSW sent [facility] therapy notes, FL-2, D/C summary and SNF transfer report via the hub and selected the facility via the selected tab.  RN stated she will update pt, CSW updated pt's daughter Semaya Vida who is aware and at [facility].    CSW completed med necessity transport form and faxed comepleted form and pt's facesheet to pt's RN at [phone] to provide to [facility].  CSW called [facility].  Alphonse Guild. Jiovani Mccammon, Latanya Presser, LCAS Clinical Social Worker Ph: [phone]

## 2017-03-02 NOTE — Progress Notes (Signed)
  Echocardiogram 2D Echocardiogram has been performed.  Joanna Hill L Androw 03/02/2017, 10:59 AM

## 2017-03-02 NOTE — Progress Notes (Signed)
Inpatient Diabetes Program Recommendations  AACE/ADA: New Consensus Statement on Inpatient Glycemic Control (2015)  Target Ranges:  Prepandial:   less than 140 mg/dL      Peak postprandial:   less than 180 mg/dL (1-2 hours)      Critically ill patients:  140 - 180 mg/dL   Lab Results  Component Value Date   GLUCAP 113 (H) 03/02/2017   HGBA1C 10.6 (H) 02/28/2017    Review of Glycemic Control:Results for GLENORA, MOROCHO (MRN [id number]) as of 03/02/2017 13:09  Ref. Range 03/01/2017 06:05 03/01/2017 11:46 03/01/2017 16:34 03/01/2017 21:01 03/02/2017 06:13 03/02/2017 06:43 03/02/2017 11:11  Glucose-Capillary Latest Ref Range: 65 - 99 mg/dL 223 (H) 135 (H) 149 (H) 188 (H) 63 (L) 84 113 (H)     Diabetes history: Type 2 diabetes Outpatient Diabetes medications: Levemir 28 units q HS, Humalog 7 units with breakfast Current orders for Inpatient glycemic control:  Levemir 28 units q HS, Novolog sensitive tid with meals  Inpatient Diabetes Program Recommendations:   Please consider reducing Levemir to 24 units q HS due to low fasting blood sugar.  Thanks, Adah Perl, RN, BC-ADM Inpatient Diabetes Coordinator Pager [phone] (8a-5p)

## 2017-03-02 NOTE — Progress Notes (Signed)
Progress Note  Patient Name: Joanna Hill Date of Encounter: 03/02/2017  Primary Cardiologist:     New (Dr. Percival Spanish)  Subjective   Confused.  No SOB  Inpatient Medications    Scheduled Meds: . artificial tears   Both Eyes QHS  . dextromethorphan-guaiFENesin  1 tablet Oral BID  . diltiazem  30 mg Oral Q6H  . docusate sodium  100 mg Oral BID  . donepezil  10 mg Oral QHS  . famotidine  20 mg Oral Daily  . feeding supplement (ENSURE ENLIVE)  237 mL Oral TID BM  . fluticasone  1 spray Each Nare BID  . fluvoxaMINE  150 mg Oral QHS  . gabapentin  300 mg Oral BID  . insulin aspart  0-5 Units Subcutaneous QHS  . insulin aspart  0-9 Units Subcutaneous TID WC  . insulin detemir  28 Units Subcutaneous Q2200  . ketotifen  1 drop Both Eyes BID  . [START ON 03/03/2017] levothyroxine  100 mcg Oral QAC breakfast  . loratadine  10 mg Oral Daily  . mirtazapine  15 mg Oral QHS  . neomycin-polymyxin-dexameth  1 application Both Eyes QHS  . pantoprazole  40 mg Oral Daily  . polyethylene glycol  17 g Oral Daily  . polyvinyl alcohol  1 drop Both Eyes QID  . Rivaroxaban  15 mg Oral Q supper  . sucralfate  1 g Oral QID  . triamcinolone cream   Topical TID   Continuous Infusions:  PRN Meds: acetaminophen, diphenhydrAMINE, metoprolol tartrate, ondansetron (ZOFRAN) IV   Vital Signs    Vitals:   03/02/17 0000 03/02/17 0400 03/02/17 0600 03/02/17 0613  BP: 124/81 96/65 108/66   Pulse: (!) 101 88 98   Resp: (!) 36 19 18   Temp: 98.2 F (36.8 C) 98.1 F (36.7 C)    TempSrc: Axillary Oral    SpO2: 94% 92% 94%   Weight:    157 lb 6.5 oz (71.4 kg)  Height:        Intake/Output Summary (Last 24 hours) at 03/02/17 1025 Last data filed at 03/01/17 1900  Gross per 24 hour  Intake          1762.51 ml  Output              300 ml  Net          1462.51 ml   Filed Weights   02/28/17 1102 03/02/17 0613  Weight: 130 lb (59 kg) 157 lb 6.5 oz (71.4 kg)    Telemetry    NA - Personally  Reviewed  ECG    NA - Personally Reviewed  Physical Exam   GEN: No acute distress.  Chronically ill appearing Neck: No  JVD Cardiac: Irregular RR, no murmurs, rubs, or gallops.  Respiratory: Clear  to auscultation bilaterally. GI: Soft, nontender, non-distended  MS: No edema; No deformity. Neuro:  Nonfocal, confused Psych: Normal affect   Labs    Chemistry Recent Labs Lab 02/28/17 1120 03/01/17 0443  NA 136 136  K 4.4 4.1  CL 100* 104  CO2 26 25  GLUCOSE 230* 282*  BUN 34* 32*  CREATININE 1.51* 1.13*  CALCIUM 9.2 8.7*  PROT 6.3*  --   ALBUMIN 2.5*  --   AST 16  --   ALT 17  --   ALKPHOS 97  --   BILITOT 0.4  --   GFRNONAA 31* 44*  GFRAA 36* 51*  ANIONGAP 10 7     Hematology  Recent Labs Lab 02/28/17 1120 03/01/17 0443  WBC 11.1* 7.4  RBC 3.53* 3.20*  HGB 10.3* 9.2*  HCT 31.5* 28.9*  MCV 89.2 90.3  MCH 29.2 28.8  MCHC 32.7 31.8  RDW 13.2 13.6  PLT 190 158    Cardiac Enzymes Recent Labs Lab 02/28/17 1120 02/28/17 1626 02/28/17 2149 03/01/17 0443  TROPONINI 0.03* 0.08* 0.03* 0.03*   No results for input(s): TROPIPOC in the last 168 hours.   BNP Recent Labs Lab 02/28/17 1120  BNP 109.6*     DDimer No results for input(s): DDIMER in the last 168 hours.   Radiology    Ct Abdomen Pelvis Wo Contrast  Result Date: 02/28/2017 CLINICAL DATA:  Lower abdominal pain and tenderness with constipation for several days. EXAM: CT ABDOMEN AND PELVIS WITHOUT CONTRAST TECHNIQUE: Multidetector CT imaging of the abdomen and pelvis was performed following the standard protocol without IV contrast. COMPARISON:  None. FINDINGS: Lower chest: Minimal dependent pulmonary atelectasis. The heart is enlarged. There is aortic atherosclerosis. There is coronary artery calcification. Hepatobiliary: Normal without contrast.  No calcified gallstones. Pancreas: Normal Spleen: Normal Adrenals/Urinary Tract: Adrenal glands are normal. Renal parenchyma is normal. No  hydronephrosis. There are vascular calcifications. Bladder is normal. Stomach/Bowel: No acute bowel finding. The patient does have a moderate amount of fecal matter within the colon, but within the range of considered normal. No sign of diverticulosis or diverticulitis. No small bowel pathology. Vascular/Lymphatic: Aortic atherosclerosis. No aneurysm. IVC is normal. No abdominal retroperitoneal lymphadenopathy. There are abnormal enlarged nodes in the right iliac and inguinal region. Inguinal node measures 15 x 25 mm. Index iliac node image 68 measures 2.1 cm in diameter. Reproductive: Previous hysterectomy.  No pelvic mass. Other: No free fluid or air. Musculoskeletal: Old appearing healed compression fractures at L2 and L4. Mild lower lumbar degenerative changes. IMPRESSION: No acute finding or finding to explain the presenting symptoms. Patient has a moderate amount of fecal matter within the colon but this would generally be considered within range of normal. Right iliac and inguinal lymphadenopathy. Inguinal node measures 15 x 25 mm. Index iliac node measures 21 mm in diameter. These are of concern and could be involved by metastatic disease or lymphoma. Electronically Signed   By: Nelson Chimes M.D.   On: 02/28/2017 14:07   Dg Chest 2 View  Result Date: 02/28/2017 CLINICAL DATA:  Atrial fibrillation. EXAM: CHEST  2 VIEW COMPARISON:  CT 01/23/2016 . FINDINGS: Mediastinum and hilar structures normal. Cardiomegaly with normal pulmonary vascularity. No focal infiltrate. No pleural effusion or pneumothorax. Degenerative changes thoracic spine with thoracic spine scoliosis. Carotid vascular calcification. IMPRESSION: 1. Mild cardiomegaly. No pulmonary venous congestion. No acute pulmonary disease. 2. Carotid vascular disease . Electronically Signed   By: Marcello Moores  Register   On: 02/28/2017 12:12    Cardiac Studies   Echo pending.    Patient Profile     83 y.o. female with a hx of atrial fibrillation  (xarelto), blindness, breast cancer, carotid stenosis, dementia, diabetes, GERD hyperlipidemia, hypothyroidism, stroke.  The patient is being today for the evaluation of atrial fibrillation with RVR at the request of Dr. Carolin Sicks.  Assessment & Plan    ATRIAL FIB:   Rate is well controlled.  I will change to Cardizem CD.    ELEVATED TROPONIN:    Borderline troponin.  Nondiagnostic. No ischemia work up.    Signed, Minus Breeding, MD  03/02/2017, 10:25 AM

## 2017-03-02 NOTE — Progress Notes (Signed)
03/02/2017 7:58 PM Report called to [facility]. [place]

## 2017-03-02 NOTE — Clinical Social Work Note (Signed)
Clinical Social Work Assessment  Patient Details  Name: Joanna Hill MRN: [id number] Date of Birth: [birth year]  Date of referral:  03/02/17               Reason for consult:  Facility Placement                Permission sought to share information with:  Family Supports Permission granted to share information::  Yes, Verbal Permission Granted  Name::     Lennette Bihari and [place]::     Relationship::  Son, daughter in [employer] Information:  [phone]  Housing/Transportation Living arrangements for the past 2 months:  [place] (Long term) Source of Information:  Patient Patient Interpreter Needed:  None Criminal Activity/Legal Involvement Pertinent to Current Situation/Hospitalization:  No - Comment as needed Significant Relationships:  Adult Children Lives with:  Facility Resident Do you feel safe going back to the place where you live?  Yes Need for family participation in patient care:  No (Coment)  Care giving concerns:  No family or friends present at bedside during initial assessment.   Social Worker assessment / plan:  CSW spoke with pt at bedside to complete initial assessment. Pt confirmed she is a long term resident at [facility]. Facility confirmed pt can return at d/c, however no bed hold was done so admission ppwk will need to be completed again. Pt states she has three sons. One son lives in [place] however travels with work a lot. Pt states for ppwk call her daughter in law [name] if cant get her son. CSW will continue to follow and complete placement at d/c.  Employment status:  Retired Forensic scientist:  Medicare PT Recommendations:  Not assessed at this time (Return to SNF) Information / Referral to community resources:  [place]  Patient/Family's Response to care:  Pt verbalized understanding of CSW role and expressed appreciation for support. Pt denies any concern regarding pt care at this  time.   Patient/Family's Understanding of and Emotional Response to Diagnosis, Current Treatment, and Prognosis:  Pt understanding and realistic regarding physical limitations. Pt understands the need for SNF placement at d/c. Pt agreeable to SNF placement at d/c, at this time. Pt's responses emotionally appropriate during conversation with CSW. Pt denies any concern regarding treatment plan at this time. CSW will continue to provide support and facilitate d/c needs.   Emotional Assessment Appearance:  Appears stated age Attitude/Demeanor/Rapport:   (Patient was appropriate.) Affect (typically observed):  Accepting, Appropriate, Calm Orientation:  Oriented to Self, Oriented to Place, Oriented to  Time Alcohol / Substance use:  Not Applicable Psych involvement (Current and /or in the community):  No (Comment)  Discharge Needs  Concerns to be addressed:  Care Coordination Readmission within the last 30 days:  No Current discharge risk:  Dependent with Mobility Barriers to Discharge:  Continued Medical Work up   [employer], LCSW 03/02/2017, 12:11 PM

## 2017-03-02 NOTE — Care Management Note (Signed)
Case Management Note Marvetta Gibbons RN, BSN Unit 4E-Case Manager [phone]  Patient Details  Name: Joanna Hill MRN: [id number] Date of Birth: [birth year]  Subjective/Objective:  Pt admitted with  afib with RVR                Action/Plan: PTA pt was at [facility]- plan to return to SNF- CSW following for SNF.   Expected Discharge Date:  03/02/17               Expected Discharge Plan:  [place]  In-House Referral:  Clinical Social Work  Discharge planning Services  CM Consult  Post Acute Care Choice:  NA Choice offered to:  NA  DME Arranged:    DME Agency:     HH Arranged:    [place] Agency:     Status of Service:  Completed, signed off  If discussed at [employer], dates discussed:    Discharge Disposition: skilled facility   Additional Comments:  03/02/17- 1600- Mareo Portilla RN, CM- pt stable for d/c back to SNF today- CSW following for return to [place], Romeo Rabon, RN 03/02/2017, 4:10 PM

## 2017-03-02 NOTE — NC FL2 (Signed)
[place] MEDICAID FL2 LEVEL OF CARE SCREENING TOOL     IDENTIFICATION  Patient Name: Joanna Hill Birthdate: 10/18/1931 Sex: female Admission Date (Current Location): 02/28/2017  [facility] and [place] Number:  Herbalist and Address:  [facility], [place] [address], [place], [place] [address]      Provider Number: [id number]  Attending Physician Name and Address:  Rosita Fire, MD  Relative Name and Phone Number:       Current Level of Care: Hospital Recommended Level of Care: [place] Prior Approval Number:    Date Approved/Denied:   PASRR Number: [id number]  Discharge Plan: SNF    Current Diagnoses: Patient Active Problem List   Diagnosis Date Noted  . Atrial fibrillation with RVR ([place]) 02/28/2017  . Hypothyroidism 02/28/2017  . Hypertension 02/28/2017  . History of CVA in adulthood 02/28/2017  . Dementia 02/28/2017  . Constipation 02/28/2017  . GERD (gastroesophageal reflux disease) 02/28/2017  . Diabetes mellitus type 2, insulin dependent ([place]) 02/28/2017  . Hyperkalemia 01/18/2014  . Occlusion and stenosis of carotid artery without mention of cerebral infarction 09/28/2011  . HYPOTENSION 08/05/2008  . DYSPHAGIA 08/05/2008  . ALTERED MENTAL STATUS 08/01/2008  . HYPERLIPIDEMIA 09/25/2007  . HYPERTENSION 09/25/2007  . CORONARY ARTERY DISEASE 09/25/2007    Orientation RESPIRATION BLADDER Height & Weight     Self, Place, Time  Normal Incontinent Weight: 157 lb 6.5 oz (71.4 kg) Height:  5\' 4"  (162.6 cm)  BEHAVIORAL SYMPTOMS/MOOD NEUROLOGICAL BOWEL NUTRITION STATUS      Incontinent  (Please see d/c summary)  AMBULATORY STATUS COMMUNICATION OF NEEDS Skin   Limited Assist Verbally Normal                       Personal Care Assistance Level of Assistance  Bathing, Feeding, Dressing Bathing Assistance: Limited assistance Feeding assistance: Independent Dressing Assistance: Limited assistance      Functional Limitations Info  Sight, Hearing, Speech Sight Info: Adequate Hearing Info: Adequate Speech Info: Adequate    SPECIAL CARE FACTORS FREQUENCY  PT (By licensed PT), OT (By licensed OT)     PT Frequency: 3x OT Frequency: 3x            Contractures Contractures Info: Not present    Additional Factors Info  Code Status, Allergies Code Status Info: Full Code Allergies Info: No known allergies           Current Medications (03/02/2017):  This is the current hospital active medication list Current Facility-Administered Medications  Medication Dose Route Frequency Provider Last Rate Last Dose  . acetaminophen (TYLENOL) tablet 650 mg  650 mg Oral Q4H PRN Samella Parr, NP   650 mg at 03/01/17 2120  . artificial tears (LACRILUBE) ophthalmic ointment   Both Eyes QHS Rozann Lesches, [facility]      . dextromethorphan-guaiFENesin (MUCINEX DM) 30-600 MG per 12 hr tablet 1 tablet  1 tablet Oral BID Rozann Lesches, [facility]   1 tablet at 03/01/17 2120  . diltiazem (CARDIZEM CD) 24 hr capsule 120 mg  120 mg Oral Daily Hochrein, Jeneen Rinks, MD      . diphenhydrAMINE (BENADRYL) capsule 25 mg  25 mg Oral Q6H PRN Samella Parr, NP      . docusate sodium (COLACE) capsule 100 mg  100 mg Oral BID Samella Parr, NP   100 mg at 03/01/17 2120  . donepezil (ARICEPT) tablet 10 mg  10 mg Oral QHS  Samella Parr, NP   10 mg at 03/01/17 2120  . famotidine (PEPCID) tablet 20 mg  20 mg Oral Daily Samella Parr, NP   20 mg at 03/01/17 1100  . feeding supplement (ENSURE ENLIVE) (ENSURE ENLIVE) liquid 237 mL  237 mL Oral TID BM Rosita Fire, MD      . fluticasone [facility]) 50 MCG/ACT nasal spray 1 spray  1 spray Each Nare BID Samella Parr, NP   1 spray at 03/01/17 2122  . fluvoxaMINE (LUVOX) tablet 150 mg  150 mg Oral QHS Rozann Lesches, RPH   150 mg at 03/01/17 2120  . gabapentin (NEURONTIN) capsule 300 mg  300 mg Oral BID Samella Parr, NP   300 mg at 03/01/17 2120  . insulin aspart  (novoLOG) injection 0-5 Units  0-5 Units Subcutaneous QHS Samella Parr, NP   2 Units at 02/28/17 2222  . insulin aspart (novoLOG) injection 0-9 Units  0-9 Units Subcutaneous TID WC Samella Parr, NP   1 Units at 03/01/17 1800  . insulin detemir (LEVEMIR) injection 28 Units  28 Units Subcutaneous Q2200 Samella Parr, NP   28 Units at 03/01/17 2121  . ketotifen (ZADITOR) 0.025 % ophthalmic solution 1 drop  1 drop Both Eyes BID Samella Parr, NP   1 drop at 03/01/17 2121  . [START ON 03/03/2017] levothyroxine (SYNTHROID, LEVOTHROID) tablet 100 mcg  100 mcg Oral QAC breakfast Rosita Fire, MD      . loratadine (CLARITIN) tablet 10 mg  10 mg Oral Daily Samella Parr, NP   10 mg at 03/01/17 1100  . metoprolol tartrate (LOPRESSOR) injection 2.5 mg  2.5 mg Intravenous Q5 min PRN Little, Wenda Overland, MD   2.5 mg at 02/28/17 1232  . mirtazapine (REMERON) tablet 15 mg  15 mg Oral QHS Samella Parr, NP   15 mg at 03/01/17 2120  . neomycin-polymyxin-dexameth (MAXITROL) 0.1 % ophth ointment 1 application  1 application Both Eyes QHS Samella Parr, NP   1 application at 38/25/05 2121  . ondansetron (ZOFRAN) injection 4 mg  4 mg Intravenous Q6H PRN Samella Parr, NP      . pantoprazole (PROTONIX) EC tablet 40 mg  40 mg Oral Daily Erin Hearing L, NP   40 mg at 03/01/17 1100  . polyethylene glycol (MIRALAX / GLYCOLAX) packet 17 g  17 g Oral Daily Erin Hearing L, NP   17 g at 03/01/17 1100  . polyvinyl alcohol (LIQUIFILM TEARS) 1.4 % ophthalmic solution 1 drop  1 drop Both Eyes QID Samella Parr, NP   1 drop at 03/01/17 2120  . Rivaroxaban (XARELTO) tablet 15 mg  15 mg Oral Q supper Samella Parr, NP   15 mg at 03/01/17 1900  . sucralfate (CARAFATE) tablet 1 g  1 g Oral QID Samella Parr, NP   1 g at 03/01/17 2121  . triamcinolone cream (KENALOG) 0.5 %   Topical TID Samella Parr, NP         Discharge Medications: Please see discharge summary for a list of  discharge medications.  Relevant Imaging Results:  Relevant Lab Results:   Additional Information SSN: [id number]  Eileen Stanford, LCSW

## 2017-03-02 NOTE — Progress Notes (Signed)
03/02/2017 7:23 PM Pt ready to be discharged back to [facility].  Family is aware.  SW called and PTAR will be here anytime to transport back to facility.  Tele D/C'd and IV.  Will call [facility] for report. [place]

## 2017-03-02 NOTE — Discharge Summary (Addendum)
Physician Discharge Summary  Joanna Hill FKC:[id number] DOB: 22-Nov-1931 DOA: 02/28/2017  PCP: System, Pcp Not In  Admit date: 02/28/2017 Discharge date: 03/02/2017  Admitted From:SNF Disposition:SNF  Recommendations for Outpatient Follow-up:  1. Follow up with PCP in 1-2 weeks 2. Please obtain BMP/CBC in one week 3. Please check Thyroid function test in 4 weeks   Home Health: SNF Equipment/Devices:none Discharge Condition:stable CODE STATUS:full code Diet recommendation:carb modified heart healthy diet  Brief/Interim Summary: 83 year old female with history of chronic atrial fibrillation on xarelto, stroke, hypertension, GERD, diabetes, hypothyroidism sent from nursing home for abdominal pain and tachycardia. In the ER patient was found to have A. fib with RVR started on Cardizem drip and admitted for further evaluation.  #  Atrial fibrillation with RVR: Required IV Cardizem with heart rate controlled. Switched to oral diltiazem by cardiologist. DC beta blocker. Continue xarelto for anticoagulation. No ischemic workup for mild elevation in troponin as per cardiologist. This is probably likely in the setting of RVR/demand ischemia. -Heart rate and blood pressure control in current regimen. Patient denied chest pain, shortness of breath, headache or dizziness.  -Echocardiogram with EF of 50-55%, normal wall motion  #Constipation: Abdomen exam benign. Continue Colace, MiraLAX  #Hypertension: Monitor blood pressure. DC Norvasc by cardiologist. On oral Cardizem. Monitor blood pressure  #Type 2 diabetes, insulin-dependent: The dose of insulin adjusted. Monitor blood sugar level and skilled facility and follow-up with PCP.  #Hypothyroidism: TSH level elevated therefore the dose of Synthroid increased. Recommended to monitor thyroid function test in 4 weeks.  #Dementia without behavioral disturbance, exact type unspecified: Continue Aricept, Remeron.  Recommended to follow-up with  PCP.  #GERD: Continue Protonix  Discharge Diagnoses:  Principal Problem:   Atrial fibrillation with RVR (HCC) Active Problems:   Hypothyroidism   Hypertension   History of CVA in adulthood   Dementia   Constipation   GERD (gastroesophageal reflux disease)   Diabetes mellitus type 2, insulin dependent [facility])    Discharge Instructions  Discharge Instructions    Call MD for:  difficulty breathing, headache or visual disturbances    Complete by:  As directed    Call MD for:  extreme fatigue    Complete by:  As directed    Call MD for:  hives    Complete by:  As directed    Call MD for:  persistant dizziness or light-headedness    Complete by:  As directed    Call MD for:  persistant nausea and vomiting    Complete by:  As directed    Call MD for:  severe uncontrolled pain    Complete by:  As directed    Call MD for:  temperature >100.4    Complete by:  As directed    Diet - low sodium heart healthy    Complete by:  As directed    Diet Carb Modified    Complete by:  As directed    Increase activity slowly    Complete by:  As directed      Allergies as of 03/02/2017   No Known Allergies     Medication List    STOP taking these medications   amLODipine 10 MG tablet Commonly known as:  NORVASC   cephALEXin 250 MG capsule Commonly known as:  KEFLEX   furosemide 20 MG tablet Commonly known as:  LASIX   HUMALOG KWIKPEN 100 UNIT/ML KiwkPen Generic drug:  insulin lispro   insulin lispro 100 UNIT/ML injection Commonly known as:  HUMALOG  LANTUS SOLOSTAR 100 UNIT/ML Solostar Pen Generic drug:  Insulin Glargine   loratadine 10 MG tablet Commonly known as:  CLARITIN   metoprolol succinate 25 MG 24 hr tablet Commonly known as:  TOPROL-XL   metoprolol tartrate 25 MG tablet Commonly known as:  LOPRESSOR   mometasone 0.1 % ointment Commonly known as:  ELOCON   NOVOLOG FLEXPEN 100 UNIT/ML FlexPen Generic drug:  insulin aspart   OXYGEN   potassium  chloride SA 20 MEQ tablet Commonly known as:  K-DUR,KLOR-CON   valsartan 160 MG tablet Commonly known as:  DIOVAN     TAKE these medications   acetaminophen 325 MG tablet Commonly known as:  TYLENOL Take 650 mg by mouth every 6 (six) hours as needed for mild pain, moderate pain, fever or headache.   antiseptic oral rinse Liqd 5 mLs by Mouth Rinse route 2 (two) times daily.   ARTIFICIAL TEAR OP Place 1 drop into both eyes 4 (four) times daily.   Carboxymethylcellulose Sodium 0.25 % Soln Apply 1 drop to eye 4 (four) times daily.   cetirizine 10 MG tablet Commonly known as:  ZYRTEC Take 10 mg by mouth daily. What changed:  Another medication with the same name was removed. Continue taking this medication, and follow the directions you see here.   cyproheptadine 4 MG tablet Commonly known as:  PERIACTIN Take one tablet twice daily   diltiazem 120 MG 24 hr capsule Commonly known as:  CARDIZEM CD Take 1 capsule (120 mg total) by mouth daily.   diphenhydrAMINE 25 mg capsule Commonly known as:  BENADRYL Take 25 mg by mouth every 6 (six) hours as needed.   docusate sodium 100 MG capsule Commonly known as:  COLACE Take 100 mg by mouth 2 (two) times daily.   donepezil 10 MG tablet Commonly known as:  ARICEPT Take 10 mg by mouth at bedtime.   fluticasone 50 MCG/ACT nasal spray Commonly known as:  FLONASE Place 1 spray into both nostrils 2 (two) times daily.   gabapentin 300 MG capsule Commonly known as:  NEURONTIN Take 300 mg by mouth 2 (two) times daily.   ketotifen 0.025 % ophthalmic solution Commonly known as:  ZADITOR Place 1 drop into both eyes 2 (two) times daily. What changed:  Another medication with the same name was removed. Continue taking this medication, and follow the directions you see here.   LEVEMIR FLEXTOUCH 100 UNIT/ML Pen Generic drug:  Insulin Detemir Inject 24 Units into the skin daily at 10 pm. What changed:  how much to take  Another  medication with the same name was removed. Continue taking this medication, and follow the directions you see here.   levothyroxine 100 MCG tablet Commonly known as:  SYNTHROID, LEVOTHROID Take 1 tablet (100 mcg total) by mouth daily before breakfast. What changed:  medication strength  how much to take   LUVOX CR 150 MG Cp24 Generic drug:  Fluvoxamine Maleate Take 150 mg by mouth at bedtime.   mirtazapine 15 MG tablet Commonly known as:  REMERON Take 15 mg by mouth at bedtime.   MUCINEX D 60-600 MG 12 hr tablet Generic drug:  pseudoephedrine-guaifenesin Take 1 tablet by mouth every 12 (twelve) hours. What changed:  Another medication with the same name was removed. Continue taking this medication, and follow the directions you see here.   neomycin-polymyxin-dexameth 0.1 % Oint Commonly known as:  MAXITROL Place 1 application into both eyes at bedtime.   OCUSOFT LID SCRUB Pads Apply 1 each  topically 2 (two) times daily.   pantoprazole 40 MG tablet Commonly known as:  PROTONIX   polyethylene glycol packet Commonly known as:  MIRALAX / GLYCOLAX Take 17 g by mouth daily as needed.   ranitidine 150 MG tablet Commonly known as:  ZANTAC Take 150 mg by mouth 2 (two) times daily.   Rivaroxaban 15 MG Tabs tablet Commonly known as:  XARELTO Take 15 mg by mouth daily with supper.   sucralfate 1 g tablet Commonly known as:  CARAFATE Take 1 g by mouth 4 (four) times daily.   SYSTANE OVERNIGHT THERAPY 0.3 % Gel ophthalmic ointment Generic drug:  hypromellose Place 1 application into both eyes at bedtime.   triamcinolone cream 0.5 % Commonly known as:  KENALOG What changed:  Another medication with the same name was removed. Continue taking this medication, and follow the directions you see here.   UNABLE TO FIND Place 1 application into both eyes at bedtime. Vaseline Petroleum jelly      Follow-up Information    Minus Breeding, MD. Schedule an appointment as soon as  possible for a visit in 2 week(s).   Specialty:  Cardiology Contact information: [address] [place] [address] [phone]          No Known Allergies  Consultations: cardiologist  Procedures/Studies: echo  Subjective: Patient was seen and examined at bedside. Reported feeling good. Denied headache, tinnitus, nausea vomiting chest pain or shortness of breath.  Discharge Exam: Vitals:   03/02/17 0600 03/02/17 1200  BP: 108/66 129/74  Pulse: 98 93  Resp: 18 (!) 0  Temp:  97.8 F (36.6 C)   Vitals:   03/02/17 0400 03/02/17 0600 03/02/17 0613 03/02/17 1200  BP: 96/65 108/66  129/74  Pulse: 88 98  93  Resp: 19 18  (!) 0  Temp: 98.1 F (36.7 C)   97.8 F (36.6 C)  TempSrc: Oral   Oral  SpO2: 92% 94%  98%  Weight:   71.4 kg (157 lb 6.5 oz)   Height:        General: Pt is alert, awake, not in acute distress Cardiovascular: RRR, S1/S2 +, no rubs, no gallops Respiratory: CTA bilaterally, no wheezing, no rhonchi Abdominal: Soft, NT, ND, bowel sounds + Extremities: no edema, no cyanosis Neurology: Alert awake and oriented   The results of significant diagnostics from this hospitalization (including imaging, microbiology, ancillary and laboratory) are listed below for reference.     Microbiology: Recent Results (from the past 240 hour(s))  MRSA PCR Screening     Status: None   Collection Time: 02/28/17 11:44 PM  Result Value Ref Range Status   MRSA by PCR NEGATIVE NEGATIVE Final    Comment:        The GeneXpert MRSA Assay (FDA approved for NASAL specimens only), is one component of a comprehensive MRSA colonization surveillance program. It is not intended to diagnose MRSA infection nor to guide or monitor treatment for MRSA infections.      Labs: BNP (last 3 results)  Recent Labs  02/28/17 1120  BNP 329.5*   Basic Metabolic Panel:  Recent Labs Lab 02/28/17 1120 02/28/17 2149 03/01/17 0443  NA 136  --  136  K 4.4  --   4.1  CL 100*  --  104  CO2 26  --  25  GLUCOSE 230*  --  282*  BUN 34*  --  32*  CREATININE 1.51*  --  1.13*  CALCIUM 9.2  --  8.7*  MG  --  2.1  --    Liver Function Tests:  Recent Labs Lab 02/28/17 1120  AST 16  ALT 17  ALKPHOS 97  BILITOT 0.4  PROT 6.3*  ALBUMIN 2.5*    Recent Labs Lab 02/28/17 1120  LIPASE 23   No results for input(s): AMMONIA in the last 168 hours. CBC:  Recent Labs Lab 02/28/17 1120 03/01/17 0443  WBC 11.1* 7.4  HGB 10.3* 9.2*  HCT 31.5* 28.9*  MCV 89.2 90.3  PLT 190 158   Cardiac Enzymes:  Recent Labs Lab 02/28/17 1120 02/28/17 1626 02/28/17 2149 03/01/17 0443  TROPONINI 0.03* 0.08* 0.03* 0.03*   BNP: Invalid input(s): POCBNP CBG:  Recent Labs Lab 03/01/17 1634 03/01/17 2101 03/02/17 0613 03/02/17 0643 03/02/17 1111  GLUCAP 149* 188* 63* 84 113*   D-Dimer No results for input(s): DDIMER in the last 72 hours. Hgb A1c  Recent Labs  02/28/17 1626  HGBA1C 10.6*   Lipid Profile No results for input(s): CHOL, HDL, LDLCALC, TRIG, CHOLHDL, LDLDIRECT in the last 72 hours. Thyroid function studies  Recent Labs  03/01/17 1534  TSH 14.859*   Anemia work up No results for input(s): VITAMINB12, FOLATE, FERRITIN, TIBC, IRON, RETICCTPCT in the last 72 hours. Urinalysis    Component Value Date/Time   COLORURINE YELLOW 02/28/2017 1317   APPEARANCEUR CLEAR 02/28/2017 1317   LABSPEC 1.016 02/28/2017 1317   PHURINE 5.0 02/28/2017 1317   GLUCOSEU 150 (A) 02/28/2017 1317   HGBUR NEGATIVE 02/28/2017 1317   BILIRUBINUR NEGATIVE 02/28/2017 1317   KETONESUR NEGATIVE 02/28/2017 1317   PROTEINUR NEGATIVE 02/28/2017 1317   UROBILINOGEN 0.2 07/23/2013 1230   NITRITE NEGATIVE 02/28/2017 1317   LEUKOCYTESUR NEGATIVE 02/28/2017 1317   Sepsis Labs Invalid input(s): PROCALCITONIN,  WBC,  LACTICIDVEN Microbiology Recent Results (from the past 240 hour(s))  MRSA PCR Screening     Status: None   Collection Time: 02/28/17 11:44  PM  Result Value Ref Range Status   MRSA by PCR NEGATIVE NEGATIVE Final    Comment:        The GeneXpert MRSA Assay (FDA approved for NASAL specimens only), is one component of a comprehensive MRSA colonization surveillance program. It is not intended to diagnose MRSA infection nor to guide or monitor treatment for MRSA infections.      Time coordinating discharge: 32 minutes  SIGNED:   Rosita Fire, MD  [facility] 03/02/2017, 4:03 PM  If 7PM-7AM, please contact night-coverage [email or web address] Password [email or web address]

## 2017-03-02 NOTE — Progress Notes (Signed)
Pt left via [facility] no compalints

## 2017-03-02 NOTE — Progress Notes (Signed)
CSW completed Med Necessity Transport Form and attempted to fax it to [facility] times at [phone] ([place] provided by Architect) but fax will not go through.  CSW spoke to pt's RN Katharine Look who printed out med necessity transport form and facesheet, added pt's SSN, # for report, room number at [facility], Code:Full and facesheet and will add any hard copies of scripts to provide to [facility].    Please reconsult if future social work needs arise.  CSW signing off, as social work intervention is no longer needed.  Alphonse Guild. Klare Criss, Reed Pandy, CSI Clinical Social Worker Ph: [phone]

## 2017-03-02 NOTE — Progress Notes (Signed)
CSW received a call from Micco at [facility] stating they have accepted the pt.  Number for report is: [phone] Pt's unit/room/bed number will be: 75 Accepting physician: SNF MD   Pt can arrive ASAP on 03/02/17  CSW will update RN.  Alphonse Guild. Hue Steveson, Reed Pandy, CSI Clinical Social Worker Ph: [phone]

## 2017-03-03 DIAGNOSIS — R296 Repeated falls: Secondary | ICD-10-CM | POA: Diagnosis not present

## 2017-03-03 DIAGNOSIS — R262 Difficulty in walking, not elsewhere classified: Secondary | ICD-10-CM | POA: Diagnosis not present

## 2017-03-03 DIAGNOSIS — R1311 Dysphagia, oral phase: Secondary | ICD-10-CM | POA: Diagnosis not present

## 2017-03-03 DIAGNOSIS — E119 Type 2 diabetes mellitus without complications: Secondary | ICD-10-CM | POA: Diagnosis not present

## 2017-03-03 DIAGNOSIS — I10 Essential (primary) hypertension: Secondary | ICD-10-CM | POA: Diagnosis not present

## 2017-03-03 DIAGNOSIS — J1089 Influenza due to other identified influenza virus with other manifestations: Secondary | ICD-10-CM | POA: Diagnosis not present

## 2017-03-03 DIAGNOSIS — R278 Other lack of coordination: Secondary | ICD-10-CM | POA: Diagnosis not present

## 2017-03-03 DIAGNOSIS — I4891 Unspecified atrial fibrillation: Secondary | ICD-10-CM | POA: Diagnosis not present

## 2017-03-03 DIAGNOSIS — E1140 Type 2 diabetes mellitus with diabetic neuropathy, unspecified: Secondary | ICD-10-CM | POA: Diagnosis not present

## 2017-03-03 DIAGNOSIS — I69898 Other sequelae of other cerebrovascular disease: Secondary | ICD-10-CM | POA: Diagnosis not present

## 2017-03-03 DIAGNOSIS — M6281 Muscle weakness (generalized): Secondary | ICD-10-CM | POA: Diagnosis not present

## 2017-03-03 DIAGNOSIS — J45909 Unspecified asthma, uncomplicated: Secondary | ICD-10-CM | POA: Diagnosis not present

## 2017-03-04 DIAGNOSIS — D649 Anemia, unspecified: Secondary | ICD-10-CM | POA: Diagnosis not present

## 2017-03-04 DIAGNOSIS — Z79899 Other long term (current) drug therapy: Secondary | ICD-10-CM | POA: Diagnosis not present

## 2017-03-04 DIAGNOSIS — E559 Vitamin D deficiency, unspecified: Secondary | ICD-10-CM | POA: Diagnosis not present

## 2017-03-04 DIAGNOSIS — E119 Type 2 diabetes mellitus without complications: Secondary | ICD-10-CM | POA: Diagnosis not present

## 2017-03-04 DIAGNOSIS — E039 Hypothyroidism, unspecified: Secondary | ICD-10-CM | POA: Diagnosis not present

## 2017-03-04 DIAGNOSIS — E785 Hyperlipidemia, unspecified: Secondary | ICD-10-CM | POA: Diagnosis not present

## 2017-03-04 DIAGNOSIS — K900 Celiac disease: Secondary | ICD-10-CM | POA: Diagnosis not present

## 2017-03-05 DIAGNOSIS — R278 Other lack of coordination: Secondary | ICD-10-CM | POA: Diagnosis not present

## 2017-03-05 DIAGNOSIS — J1089 Influenza due to other identified influenza virus with other manifestations: Secondary | ICD-10-CM | POA: Diagnosis not present

## 2017-03-05 DIAGNOSIS — M6281 Muscle weakness (generalized): Secondary | ICD-10-CM | POA: Diagnosis not present

## 2017-03-05 DIAGNOSIS — R1311 Dysphagia, oral phase: Secondary | ICD-10-CM | POA: Diagnosis not present

## 2017-03-05 DIAGNOSIS — R296 Repeated falls: Secondary | ICD-10-CM | POA: Diagnosis not present

## 2017-03-05 DIAGNOSIS — I69898 Other sequelae of other cerebrovascular disease: Secondary | ICD-10-CM | POA: Diagnosis not present

## 2017-03-06 DIAGNOSIS — M6281 Muscle weakness (generalized): Secondary | ICD-10-CM | POA: Diagnosis not present

## 2017-03-06 DIAGNOSIS — R1311 Dysphagia, oral phase: Secondary | ICD-10-CM | POA: Diagnosis not present

## 2017-03-06 DIAGNOSIS — R296 Repeated falls: Secondary | ICD-10-CM | POA: Diagnosis not present

## 2017-03-06 DIAGNOSIS — R278 Other lack of coordination: Secondary | ICD-10-CM | POA: Diagnosis not present

## 2017-03-06 DIAGNOSIS — J1089 Influenza due to other identified influenza virus with other manifestations: Secondary | ICD-10-CM | POA: Diagnosis not present

## 2017-03-06 DIAGNOSIS — I69898 Other sequelae of other cerebrovascular disease: Secondary | ICD-10-CM | POA: Diagnosis not present

## 2017-03-07 DIAGNOSIS — M6281 Muscle weakness (generalized): Secondary | ICD-10-CM | POA: Diagnosis not present

## 2017-03-07 DIAGNOSIS — R1311 Dysphagia, oral phase: Secondary | ICD-10-CM | POA: Diagnosis not present

## 2017-03-07 DIAGNOSIS — I69898 Other sequelae of other cerebrovascular disease: Secondary | ICD-10-CM | POA: Diagnosis not present

## 2017-03-07 DIAGNOSIS — R296 Repeated falls: Secondary | ICD-10-CM | POA: Diagnosis not present

## 2017-03-07 DIAGNOSIS — R278 Other lack of coordination: Secondary | ICD-10-CM | POA: Diagnosis not present

## 2017-03-07 DIAGNOSIS — J1089 Influenza due to other identified influenza virus with other manifestations: Secondary | ICD-10-CM | POA: Diagnosis not present

## 2017-03-08 ENCOUNTER — Ambulatory Visit (INDEPENDENT_AMBULATORY_CARE_PROVIDER_SITE_OTHER): Payer: Medicare Other | Admitting: Allergy and Immunology

## 2017-03-08 ENCOUNTER — Encounter: Payer: Self-pay | Admitting: Allergy and Immunology

## 2017-03-08 VITALS — BP 124/68 | HR 109 | Temp 97.9°F

## 2017-03-08 DIAGNOSIS — J3089 Other allergic rhinitis: Secondary | ICD-10-CM | POA: Diagnosis not present

## 2017-03-08 DIAGNOSIS — L299 Pruritus, unspecified: Secondary | ICD-10-CM | POA: Diagnosis not present

## 2017-03-08 DIAGNOSIS — R296 Repeated falls: Secondary | ICD-10-CM | POA: Diagnosis not present

## 2017-03-08 DIAGNOSIS — F0390 Unspecified dementia without behavioral disturbance: Secondary | ICD-10-CM | POA: Diagnosis not present

## 2017-03-08 DIAGNOSIS — J1089 Influenza due to other identified influenza virus with other manifestations: Secondary | ICD-10-CM | POA: Diagnosis not present

## 2017-03-08 DIAGNOSIS — I10 Essential (primary) hypertension: Secondary | ICD-10-CM | POA: Diagnosis not present

## 2017-03-08 DIAGNOSIS — E119 Type 2 diabetes mellitus without complications: Secondary | ICD-10-CM | POA: Diagnosis not present

## 2017-03-08 DIAGNOSIS — I4891 Unspecified atrial fibrillation: Secondary | ICD-10-CM | POA: Diagnosis not present

## 2017-03-08 DIAGNOSIS — M6281 Muscle weakness (generalized): Secondary | ICD-10-CM | POA: Diagnosis not present

## 2017-03-08 DIAGNOSIS — R1311 Dysphagia, oral phase: Secondary | ICD-10-CM | POA: Diagnosis not present

## 2017-03-08 DIAGNOSIS — E039 Hypothyroidism, unspecified: Secondary | ICD-10-CM | POA: Diagnosis not present

## 2017-03-08 DIAGNOSIS — R278 Other lack of coordination: Secondary | ICD-10-CM | POA: Diagnosis not present

## 2017-03-08 DIAGNOSIS — I69898 Other sequelae of other cerebrovascular disease: Secondary | ICD-10-CM | POA: Diagnosis not present

## 2017-03-08 NOTE — Patient Instructions (Addendum)
  1. Use a combination of the following medications every day:   A. Cetirizine 10mg  one tablet two times per day  B. Ranitidine 150 one tablet two times per day   C. loratadine 10 mg one tablet 2 times per day  D. Flonase - one spray each nostril two times per day  2. Continue moisturization of skin after bath / shower  3. Obtain a nocturnal oximetry study OFF oxygen to determine if oxygen is required. If study is normal discontinue oxygen  4. Stop oxygen use during daytime  5. Return to clinic in 12 weeks or earlier if problem

## 2017-03-08 NOTE — Progress Notes (Signed)
Follow-up Note  Referring Provider: Reynold Bowen, MD Primary Provider: System, Pcp Not In Date of Office Visit: 03/08/2017  Subjective:   Joanna Hill (DOB: February 26, 1932) is a 83 y.o. female who returns to the [facility] on 03/08/2017 in re-evaluation of the following:  HPI: Joanna Hill returns to this clinic in reevaluation of her pruritic disorder. His last visit to this clinic was May 2018.  Joanna Hill is actually less itchy. Joanna Hill does not scratch herself as much. Joanna Hill may be using the moisturization cream or possibly a prescription cream after taking a shower. Joanna Hill has terminate use of Amlodipine.  Joanna Hill now complains of having some postnasal drip and throat clearing and a glob stuck in her throat and some intermittent nasal congestion. Joanna Hill can smell food and Joanna Hill does not have any ugly nasal discharge. This may have been an issue since her recent hospitalization for atrial fibrillation with rapid ventricular rate on the 17th of this month. Joanna Hill has been using oxygen since that hospitalization.  Allergies as of 03/08/2017   No Known Allergies     Medication List      acetaminophen 325 MG tablet Commonly known as:  TYLENOL Take 650 mg by mouth every 6 (six) hours as needed for mild pain, moderate pain, fever or headache.   antiseptic oral rinse Liqd 5 mLs by Mouth Rinse route 2 (two) times daily.   ARTIFICIAL TEAR OP Place 1 drop into both eyes 4 (four) times daily.   Carboxymethylcellulose Sodium 0.25 % Soln Apply 1 drop to eye 4 (four) times daily.   cephALEXin 500 MG capsule Commonly known as:  KEFLEX   cetirizine 10 MG tablet Commonly known as:  ZYRTEC Take 10 mg by mouth daily.   Colchicine 0.6 MG Caps   cyproheptadine 4 MG tablet Commonly known as:  PERIACTIN Take one tablet twice daily   diltiazem 120 MG 24 hr capsule Commonly known as:  CARDIZEM CD Take 1 capsule (120 mg total) by mouth daily.   diphenhydrAMINE 25 mg capsule Commonly known as:   BENADRYL Take 25 mg by mouth every 6 (six) hours as needed.   docusate sodium 100 MG capsule Commonly known as:  COLACE Take 100 mg by mouth 2 (two) times daily.   donepezil 10 MG tablet Commonly known as:  ARICEPT Take 10 mg by mouth at bedtime.   fluticasone 50 MCG/ACT nasal spray Commonly known as:  FLONASE Place 1 spray into both nostrils 2 (two) times daily.   furosemide 40 MG tablet Commonly known as:  LASIX   gabapentin 300 MG capsule Commonly known as:  NEURONTIN Take 300 mg by mouth 2 (two) times daily.   HUMALOG KWIKPEN 100 UNIT/ML KiwkPen Generic drug:  insulin lispro   ketotifen 0.025 % ophthalmic solution Commonly known as:  ZADITOR Place 1 drop into both eyes 2 (two) times daily.   LANTUS SOLOSTAR 100 UNIT/ML Solostar Pen Generic drug:  Insulin Glargine   LEVEMIR FLEXTOUCH 100 UNIT/ML Pen Generic drug:  Insulin Detemir Inject 24 Units into the skin daily at 10 pm.   levothyroxine 100 MCG tablet Commonly known as:  SYNTHROID, LEVOTHROID Take 1 tablet (100 mcg total) by mouth daily before breakfast.   LUVOX CR 150 MG Cp24 Generic drug:  Fluvoxamine Maleate Take 150 mg by mouth at bedtime.   metoprolol succinate 25 MG 24 hr tablet Commonly known as:  TOPROL-XL   metoprolol tartrate 50 MG tablet Commonly known as:  LOPRESSOR   mirtazapine  15 MG tablet Commonly known as:  REMERON Take 15 mg by mouth at bedtime.   MUCINEX D 60-600 MG 12 hr tablet Generic drug:  pseudoephedrine-guaifenesin Take 1 tablet by mouth every 12 (twelve) hours.   neomycin-polymyxin-dexameth 0.1 % Oint Commonly known as:  MAXITROL Place 1 application into both eyes at bedtime.   OCUSOFT LID SCRUB Pads Apply 1 each topically 2 (two) times daily.   pantoprazole 40 MG tablet Commonly known as:  PROTONIX   polyethylene glycol packet Commonly known as:  MIRALAX / GLYCOLAX Take 17 g by mouth daily as needed.   potassium chloride SA 20 MEQ tablet Commonly known as:   K-DUR,KLOR-CON   ranitidine 150 MG tablet Commonly known as:  ZANTAC Take 150 mg by mouth 2 (two) times daily.   Rivaroxaban 15 MG Tabs tablet Commonly known as:  XARELTO Take 15 mg by mouth daily with supper.   sucralfate 1 g tablet Commonly known as:  CARAFATE Take 1 g by mouth 4 (four) times daily.   SYSTANE OVERNIGHT THERAPY 0.3 % Gel ophthalmic ointment Generic drug:  hypromellose Place 1 application into both eyes at bedtime.   triamcinolone cream 0.5 % Commonly known as:  KENALOG   UNABLE TO FIND Place 1 application into both eyes at bedtime. Vaseline Petroleum jelly   valsartan 160 MG tablet Commonly known as:  DIOVAN       Past Medical History:  Diagnosis Date  . Atrial fibrillation with RVR ([place]) 02/28/2017  . Blindness   . Cancer ([place])    breast  . Carotid artery occlusion   . Dementia   . Diabetes mellitus   . GERD (gastroesophageal reflux disease)   . Hyperlipidemia   . Hypertension   . Hypothyroidism   . Stroke [facility]) 2009 and  2011   difficulty with swallowing    Past Surgical History:  Procedure Laterality Date  . ABDOMINAL HYSTERECTOMY    . Arch Aortogram  03-15-2010  . MASTECTOMY, PARTIAL Right   . ROTATOR CUFF REPAIR Left 1981    Review of systems negative except as noted in HPI / PMHx or noted below:  Review of Systems  Constitutional: Negative.   HENT: Negative.   Eyes: Negative.   Respiratory: Negative.   Cardiovascular: Negative.   Gastrointestinal: Negative.   Genitourinary: Negative.   Musculoskeletal: Negative.   Skin: Negative.   Neurological: Negative.   Endo/Heme/Allergies: Negative.   Psychiatric/Behavioral: Negative.      Objective:   Vitals:   03/08/17 0934  BP: 124/68  Pulse: (!) 109  Temp: 97.9 F (36.6 C)          Physical Exam  Constitutional: Joanna Hill is well-developed, well-nourished, and in no distress.  Wheelchair, slightly slurred speech and slight difficulty in word finding  HENT:  Head:  Normocephalic.  Right Ear: Tympanic membrane, external ear and ear canal normal.  Left Ear: Tympanic membrane, external ear and ear canal normal.  Nose: Nose normal. No mucosal edema or rhinorrhea.  Mouth/Throat: Uvula is midline, oropharynx is clear and moist and mucous membranes are normal. No oropharyngeal exudate.  Eyes: Conjunctivae are normal.  Neck: Trachea normal. No tracheal tenderness present. No tracheal deviation present. No thyromegaly present.  Cardiovascular: Normal rate, regular rhythm, S1 normal, S2 normal and normal heart sounds.   No murmur heard. Pulmonary/Chest: No stridor. No respiratory distress. Wheezes: slight inspiratory crackles posterior lower lung fields. Joanna Hill has no rales.  Musculoskeletal: Joanna Hill exhibits no edema.  Lymphadenopathy:  Head (right side): No tonsillar adenopathy present.       Head (left side): No tonsillar adenopathy present.    Joanna Hill has no cervical adenopathy.  Neurological: Joanna Hill is alert.  Skin: No rash noted. Joanna Hill is not diaphoretic. No erythema. Nails show no clubbing.    Diagnostics: Oxygen saturation was 98% on nasal cannula oxygen. Oxygen saturation was 96% off nasal cannula oxygen  Assessment and Plan:   1. Pruritic disorder   2. Other allergic rhinitis     1. Use a combination of the following medications every day:   A. Cetirizine 10mg  one tablet two times per day  B. Ranitidine 150 one tablet two times per day   C. loratadine 10 mg one tablet 2 times per day  D. Flonase - one spray each nostril two times per day  2. Continue moisturization of skin after bath / shower  3. Obtain a nocturnal oximetry study OFF oxygen to determine if oxygen is required. If study is normal discontinue oxygen  4. Stop oxygen use during daytime  5. Return to clinic in 12 weeks or earlier if problem   Joanna Hill is better regarding her pruritic disorder and we will keep her on the plan mentioned above for at least another 12 weeks and then see if we  can taper off some of her medications. Her immunologic hyperreactivity may have been precipitated by the use of her amlodipine and now that this has been removed Joanna Hill may be slowly rolling back that overactivity. I think that some of her complaints about her upper airway and throat may be related to the use of her oxygen as these complaints did develop one Joanna Hill was started on oxygen from her recent hospitalization. Based on today's oximetry results it does not look as though Joanna Hill requires oxygen at least during the daytime. We will have her stop her oxygen and obtain a nocturnal oximetry study off oxygen and if that is normal then Joanna Hill does not need to use oxygen either during the day or night. I will see her back in this clinic in 12 weeks or earlier if there is a problem.  Allena Katz, MD Allergy / Immunology [place]

## 2017-03-09 DIAGNOSIS — R296 Repeated falls: Secondary | ICD-10-CM | POA: Diagnosis not present

## 2017-03-09 DIAGNOSIS — R1311 Dysphagia, oral phase: Secondary | ICD-10-CM | POA: Diagnosis not present

## 2017-03-09 DIAGNOSIS — R278 Other lack of coordination: Secondary | ICD-10-CM | POA: Diagnosis not present

## 2017-03-09 DIAGNOSIS — J1089 Influenza due to other identified influenza virus with other manifestations: Secondary | ICD-10-CM | POA: Diagnosis not present

## 2017-03-09 DIAGNOSIS — I69898 Other sequelae of other cerebrovascular disease: Secondary | ICD-10-CM | POA: Diagnosis not present

## 2017-03-09 DIAGNOSIS — M6281 Muscle weakness (generalized): Secondary | ICD-10-CM | POA: Diagnosis not present

## 2017-03-10 DIAGNOSIS — L03012 Cellulitis of left finger: Secondary | ICD-10-CM | POA: Diagnosis not present

## 2017-03-10 DIAGNOSIS — R296 Repeated falls: Secondary | ICD-10-CM | POA: Diagnosis not present

## 2017-03-10 DIAGNOSIS — I4891 Unspecified atrial fibrillation: Secondary | ICD-10-CM | POA: Diagnosis not present

## 2017-03-10 DIAGNOSIS — I10 Essential (primary) hypertension: Secondary | ICD-10-CM | POA: Diagnosis not present

## 2017-03-10 DIAGNOSIS — E119 Type 2 diabetes mellitus without complications: Secondary | ICD-10-CM | POA: Diagnosis not present

## 2017-03-10 DIAGNOSIS — R278 Other lack of coordination: Secondary | ICD-10-CM | POA: Diagnosis not present

## 2017-03-10 DIAGNOSIS — R1311 Dysphagia, oral phase: Secondary | ICD-10-CM | POA: Diagnosis not present

## 2017-03-10 DIAGNOSIS — M6281 Muscle weakness (generalized): Secondary | ICD-10-CM | POA: Diagnosis not present

## 2017-03-10 DIAGNOSIS — I69898 Other sequelae of other cerebrovascular disease: Secondary | ICD-10-CM | POA: Diagnosis not present

## 2017-03-10 DIAGNOSIS — J1089 Influenza due to other identified influenza virus with other manifestations: Secondary | ICD-10-CM | POA: Diagnosis not present

## 2017-03-10 DIAGNOSIS — D649 Anemia, unspecified: Secondary | ICD-10-CM | POA: Diagnosis not present

## 2017-03-13 ENCOUNTER — Telehealth: Payer: Self-pay | Admitting: Cardiology

## 2017-03-13 DIAGNOSIS — E119 Type 2 diabetes mellitus without complications: Secondary | ICD-10-CM | POA: Diagnosis not present

## 2017-03-13 DIAGNOSIS — R296 Repeated falls: Secondary | ICD-10-CM | POA: Diagnosis not present

## 2017-03-13 DIAGNOSIS — R278 Other lack of coordination: Secondary | ICD-10-CM | POA: Diagnosis not present

## 2017-03-13 DIAGNOSIS — F419 Anxiety disorder, unspecified: Secondary | ICD-10-CM | POA: Diagnosis not present

## 2017-03-13 DIAGNOSIS — I4891 Unspecified atrial fibrillation: Secondary | ICD-10-CM | POA: Diagnosis not present

## 2017-03-13 DIAGNOSIS — I69898 Other sequelae of other cerebrovascular disease: Secondary | ICD-10-CM | POA: Diagnosis not present

## 2017-03-13 DIAGNOSIS — I10 Essential (primary) hypertension: Secondary | ICD-10-CM | POA: Diagnosis not present

## 2017-03-13 DIAGNOSIS — F429 Obsessive-compulsive disorder, unspecified: Secondary | ICD-10-CM | POA: Diagnosis not present

## 2017-03-13 DIAGNOSIS — M6281 Muscle weakness (generalized): Secondary | ICD-10-CM | POA: Diagnosis not present

## 2017-03-13 DIAGNOSIS — J1089 Influenza due to other identified influenza virus with other manifestations: Secondary | ICD-10-CM | POA: Diagnosis not present

## 2017-03-13 DIAGNOSIS — R1311 Dysphagia, oral phase: Secondary | ICD-10-CM | POA: Diagnosis not present

## 2017-03-13 NOTE — Telephone Encounter (Signed)
Jeani Hawking ([place]) calling, states that patient's resting heart rate increases at night and this has been going on since last week when she was seen in ED. Lynn leaves at 4:30 pm today but will be back tomorrow at 8am.

## 2017-03-14 DIAGNOSIS — I69898 Other sequelae of other cerebrovascular disease: Secondary | ICD-10-CM | POA: Diagnosis not present

## 2017-03-14 DIAGNOSIS — J1089 Influenza due to other identified influenza virus with other manifestations: Secondary | ICD-10-CM | POA: Diagnosis not present

## 2017-03-14 DIAGNOSIS — R278 Other lack of coordination: Secondary | ICD-10-CM | POA: Diagnosis not present

## 2017-03-14 DIAGNOSIS — M6281 Muscle weakness (generalized): Secondary | ICD-10-CM | POA: Diagnosis not present

## 2017-03-14 DIAGNOSIS — R296 Repeated falls: Secondary | ICD-10-CM | POA: Diagnosis not present

## 2017-03-14 DIAGNOSIS — R1311 Dysphagia, oral phase: Secondary | ICD-10-CM | POA: Diagnosis not present

## 2017-03-14 NOTE — Telephone Encounter (Signed)
Per prior conversation with with Wells Guiles, RN she states tat upon d/c pt was Cardizem CD 120mg  and this am they increased today to 180mg .  S/w scheduling they till scheduled ASAP appt.

## 2017-03-14 NOTE — Telephone Encounter (Signed)
Per scheduling appt with [facility] 03-15-17 3pm. Jeani Hawking [facility])( notified of appt and will have pt here early 230-315pm and will bring last few days of nursing notes for PA review for appt

## 2017-03-14 NOTE — Telephone Encounter (Signed)
Spoke with Wells Guiles, RN she states that pt was admitted to hospital via ER visit 7-17 because to was in afib and tachy  Resting HR had been running high upon admission HR 129,  HR via ECG was 151. Since being discharged back to [facility] per rebecca  Resting HR upon waking today 130 last night while sleeping 140 she states that pt is O2 dependent, Bp is good running 120-130/80's but HR running 140-150's she further states that pt goes to therapy and they have to stop rehab exercise because HR too high 180-190's, she states that  That the PA there wants pt to be seen ASAP by anyone. Will defer to DOD for further insrtuction  Call back with instructions to Jeani Hawking [facility])(per Ramond Dial) is Financial controller or may speak with Ambulance person

## 2017-03-15 ENCOUNTER — Encounter: Payer: Self-pay | Admitting: Student

## 2017-03-15 ENCOUNTER — Ambulatory Visit (INDEPENDENT_AMBULATORY_CARE_PROVIDER_SITE_OTHER): Payer: Medicare Other | Admitting: Student

## 2017-03-15 VITALS — BP 131/80 | HR 98 | Ht 64.0 in | Wt 150.2 lb

## 2017-03-15 DIAGNOSIS — Z7901 Long term (current) use of anticoagulants: Secondary | ICD-10-CM | POA: Insufficient documentation

## 2017-03-15 DIAGNOSIS — I10 Essential (primary) hypertension: Secondary | ICD-10-CM | POA: Diagnosis not present

## 2017-03-15 DIAGNOSIS — E039 Hypothyroidism, unspecified: Secondary | ICD-10-CM

## 2017-03-15 DIAGNOSIS — I480 Paroxysmal atrial fibrillation: Secondary | ICD-10-CM | POA: Diagnosis not present

## 2017-03-15 DIAGNOSIS — F429 Obsessive-compulsive disorder, unspecified: Secondary | ICD-10-CM | POA: Diagnosis not present

## 2017-03-15 DIAGNOSIS — F419 Anxiety disorder, unspecified: Secondary | ICD-10-CM | POA: Diagnosis not present

## 2017-03-15 NOTE — Patient Instructions (Signed)
Take Cardizem CD 240 mg every day   Your physician recommends that you schedule a follow-up appointment with Dr.Hochrein Tuesday 06/20/17 at 1:20 pm

## 2017-03-15 NOTE — Progress Notes (Signed)
Cardiology Office Note    Date:  03/15/2017   ID:  Joanna Hill, DOB 05/13/1932, MRN [id number]  PCP:  System, Pcp Not In  Cardiologist: Previously Dr. Verl Blalock --> Now Dr. Percival Spanish   Chief Complaint  Patient presents with  . Hospitalization Follow-up    History of Present Illness:    Joanna Hill is a 83 y.o. female with past medical history of PAF (on Xarelto), HTN, GERD, IDDM, Hypothyroidism, dementia and prior CVA who presents to the office today for hospital follow-up.   She was recently admitted to [facility] from 7/17 - 03/02/2017 for evaluation of abdominal pain and tachycardia with HR in the 130's - 140's. Electrolytes were within normal limits. Troponin values remained flat and BNP was mildly elevated at 109. TSH was significantly elevated at 14.859, therefore her Synthroid dosing was increased. Echo showed a preserved EF of 50-55%, no WMA, severely dilated LA, and moderate TR. She was initially placed on IV Cardizem with this being switched to Cardizem CD 120mg  daily at the time of discharge. PTA Toprol-XL 25mg  daily was discontinued during her hospitalization.   The office received a call from [facility] on 03/14/2017 that her HR has been in the 130's - 150's at rest and in the 180's with activity. They increased her Cardizem CD from 120mg  daily to 180mg  daily at that time.   In talking with the patient today, she denies any recent chest discomfort or palpitations. She is unaware when her heart rate is significantly elevated. She is wheelchair-bound at baseline but is able to stand and turn for transfers. She denies any recent orthopnea, PND, or lower extremity edema. She has been on 2 L [place] since hospital discharge and wishes to stop this when possible as she experiences nasal dryness.   Past Medical History:  Diagnosis Date  . Atrial fibrillation with RVR ([place]) 02/28/2017  . Blindness   . Cancer ([place])    breast  . Carotid artery occlusion   . Dementia   . Diabetes mellitus     . GERD (gastroesophageal reflux disease)   . Hyperlipidemia   . Hypertension   . Hypothyroidism   . Stroke [facility]) 2009 and  2011   difficulty with swallowing    Past Surgical History:  Procedure Laterality Date  . ABDOMINAL HYSTERECTOMY    . Arch Aortogram  03-15-2010  . MASTECTOMY, PARTIAL Right   . ROTATOR CUFF REPAIR Left 1981    Current Medications: Outpatient Medications Prior to Visit  Medication Sig Dispense Refill  . acetaminophen (TYLENOL) 325 MG tablet Take 650 mg by mouth every 6 (six) hours as needed for mild pain, moderate pain, fever or headache.     Marland Kitchen antiseptic oral rinse (BIOTENE) LIQD 5 mLs by Mouth Rinse route 2 (two) times daily.    . ARTIFICIAL TEAR OP Place 1 drop into both eyes 4 (four) times daily.    . Carboxymethylcellulose Sodium 0.25 % SOLN Apply 1 drop to eye 4 (four) times daily.    . cetirizine (ZYRTEC) 10 MG tablet Take 10 mg by mouth daily.    . Colchicine 0.6 MG CAPS     . cyproheptadine (PERIACTIN) 4 MG tablet Take one tablet twice daily 60 tablet 5  . diphenhydrAMINE (BENADRYL) 25 mg capsule Take 25 mg by mouth every 6 (six) hours as needed.    . docusate sodium (COLACE) 100 MG capsule Take 100 mg by mouth 2 (two) times daily.    Marland Kitchen donepezil (ARICEPT) 10  MG tablet Take 10 mg by mouth at bedtime.    . Eyelid Cleansers (OCUSOFT LID SCRUB) PADS Apply 1 each topically 2 (two) times daily.    . fluticasone (FLONASE) 50 MCG/ACT nasal spray Place 1 spray into both nostrils 2 (two) times daily.     . Fluvoxamine Maleate (LUVOX CR) 150 MG CP24 Take 150 mg by mouth at bedtime.    . gabapentin (NEURONTIN) 300 MG capsule Take 300 mg by mouth 2 (two) times daily.    . hypromellose (SYSTANE OVERNIGHT THERAPY) 0.3 % GEL ophthalmic ointment Place 1 application into both eyes at bedtime.    Marland Kitchen ketotifen (ZADITOR) 0.025 % ophthalmic solution Place 1 drop into both eyes 2 (two) times daily.     Marland Kitchen LEVEMIR FLEXTOUCH 100 UNIT/ML Pen Inject 24 Units into the skin daily  at 10 pm. 15 mL 0  . levothyroxine (SYNTHROID, LEVOTHROID) 100 MCG tablet Take 1 tablet (100 mcg total) by mouth daily before breakfast. 30 tablet 0        . mirtazapine (REMERON) 15 MG tablet Take 15 mg by mouth at bedtime.    Marland Kitchen neomycin-polymyxin-dexameth (MAXITROL) 0.1 % OINT Place 1 application into both eyes at bedtime.    . pantoprazole (PROTONIX) 40 MG tablet     . polyethylene glycol (MIRALAX / GLYCOLAX) packet Take 17 g by mouth daily as needed. 14 each 0  . pseudoephedrine-guaifenesin (MUCINEX D) 60-600 MG 12 hr tablet Take 1 tablet by mouth every 12 (twelve) hours.    . ranitidine (ZANTAC) 150 MG tablet Take 150 mg by mouth 2 (two) times daily.    . Rivaroxaban (XARELTO) 15 MG TABS tablet Take 15 mg by mouth daily with supper.    . sucralfate (CARAFATE) 1 g tablet Take 1 g by mouth 4 (four) times daily.     Marland Kitchen triamcinolone cream (KENALOG) 0.5 %     . UNABLE TO FIND Place 1 application into both eyes at bedtime. Vaseline Petroleum jelly    . diltiazem (CARDIZEM CD) 180 MG 24 hr capsule Take 1 capsule (180 mg total) by mouth daily. 30 capsule 0        . cephALEXin (KEFLEX) 500 MG capsule     . HUMALOG KWIKPEN 100 UNIT/ML KiwkPen     . LANTUS SOLOSTAR 100 UNIT/ML Solostar Pen            No facility-administered medications prior to visit.      Allergies:   Patient has no known allergies.   Social History   Social History  . Marital status: Widowed    Spouse name: N/A  . Number of children: 3  . Years of education: 1st   Social History Main Topics  . Smoking status: Never Smoker  . Smokeless tobacco: Former Systems developer    Types: Snuff  . Alcohol use No  . Drug use: No  . Sexual activity: Not Asked   Other Topics Concern  . None   Social History Narrative   Patient is a resident at [place] [facility], has 3 children   Patient is left handed   Educational level 1st grade   Caffeine consumption is 1-2 daily     Family History:  The patient'sfamily  history includes Asthma in her sister; Breast cancer in her sister; Cancer in her mother; Diabetes in her sister; Heart disease in her father.   Review of Systems:   Please see the history of present illness.     General:  No  chills, fever, night sweats or weight changes. Positive for baseline fatigue.  Cardiovascular:  No chest pain, dyspnea on exertion, edema, orthopnea, palpitations, paroxysmal nocturnal dyspnea. Dermatological: No rash, lesions/masses Respiratory: No cough, dyspnea Urologic: No hematuria, dysuria Abdominal:   No nausea, vomiting, diarrhea, bright red blood per rectum, melena, or hematemesis Neurologic:  No visual changes, changes in mental status. Positive for weakness.   All other systems reviewed and are otherwise negative except as noted above.   Physical Exam:    VS:  BP 131/80   Pulse 98   Ht 5\' 4"  (1.626 m)   Wt 150 lb 3.2 oz (68.1 kg)   BMI 25.78 kg/m    General: Well developed, well nourished Caucasian female appearing in no acute distress. Head: Normocephalic, atraumatic, sclera non-icteric, no xanthomas, nares are without discharge.  Neck: No carotid bruits. JVD not elevated.  Lungs: Respirations regular and unlabored, without wheezes or rales.  Heart: Irregularly irregular. No S3 or S4.  No murmur, no rubs, or gallops appreciated. Abdomen: Soft, non-tender, non-distended with normoactive bowel sounds. No hepatomegaly. No rebound/guarding. No obvious abdominal masses. Msk:  Strength and tone appear normal for age. No joint deformities or effusions. Extremities: No clubbing or cyanosis. No lower extremity edema.  Distal pedal pulses are 2+ bilaterally. Neuro: Alert and oriented X 3. Moves all extremities spontaneously. No focal deficits noted. Psych:  Responds to questions appropriately with a normal affect. Skin: No rashes or lesions noted  Wt Readings from Last 3 Encounters:  03/15/17 150 lb 3.2 oz (68.1 kg)  03/02/17 157 lb 6.5 oz (71.4 kg)    05/04/16 159 lb (72.1 kg)    Studies/Labs Reviewed:   EKG:  EKG is not ordered today.    Recent Labs: 02/28/2017: ALT 17; B Natriuretic Peptide 109.6; Magnesium 2.1 03/01/2017: BUN 32; Creatinine, Ser 1.13; Hemoglobin 9.2; Platelets 158; Potassium 4.1; Sodium 136; TSH 14.859   Lipid Panel No results found for: CHOL, TRIG, HDL, CHOLHDL, VLDL, LDLCALC, LDLDIRECT  Additional studies/ records that were reviewed today include:   Echocardiogram: 03/02/2017 Study Conclusions  - Left ventricle: The cavity size was normal. Wall thickness was   increased in a pattern of mild LVH. Systolic function was normal.   The estimated ejection fraction was in the range of 50% to 55%.   Wall motion was normal; there were no regional wall motion   abnormalities. - Aortic valve: Transvalvular velocity was within the normal range.   There was no stenosis. There was no regurgitation. - Mitral valve: There was no regurgitation. - Left atrium: The atrium was severely dilated. - Right ventricle: The cavity size was normal. Wall thickness was   normal. Systolic function was normal. - Right atrium: The atrium was moderately dilated. - Atrial septum: No defect or patent foramen ovale was identified. - Tricuspid valve: There was moderate regurgitation. - Pulmonary arteries: Systolic pressure was moderately increased.   PA peak pressure: 52 mm Hg (S). - Pericardium, extracardiac: A trivial pericardial effusion was   identified.  Assessment:    1. Paroxysmal atrial fibrillation (HCC)   2. Current use of long term anticoagulation   3. Essential hypertension   4. Hypothyroidism, unspecified type      Plan:   In order of problems listed above:  1. Paroxysmal Atrial Fibrillation/ Long-term Anticoagulation - likely persistent as EKG's dating back to 2014 show atrial fibrillation with no recordings of NSR.  - she denies any palpitations. Unable to assess exertional dyspnea as she is not  active at  baseline. Remains on 2L [place].  - HR has been elevated in the 130's - 150's by review of notes from [facility] with her Cardizem being increased from 120mg  daily to 180mg  daily on 7/31. HR remains elevated in the high-90's to low-100's during today's visit. Will further increase Cardizem CD to 240mg  daily. Wrote on her instruction sheet that if HR remains greater than 100 and SBP > 100, then can further titrate to 360mg  daily.  - This patients CHA2DS2-VASc Score and unadjusted Ischemic Stroke Rate (% per year) is equal to 11.2 % stroke rate/year from a score of 7 (HTN, DM, Female, Age (2), CVA (2)). She denies any evidence of active bleeding. Continue Xarelto for anticoagulation. On 15mg  daily dosing as creatinine clearance is < 50.  2. HTN - BP is well-controlled at 131/80 during today's visit. - continue to monitor BP closely in the setting of medication adjustments as above.   3. Hypothyroidism - TSH was significantly elevated at 14.859 during her recent admission with Synthroid dosing being increased. - further management of her Synthroid dosing by PCP.   Medication Adjustments/Labs and Tests Ordered: Current medicines are reviewed at length with the patient today.  Concerns regarding medicines are outlined above.  Medication changes, Labs and Tests ordered today are listed in the Patient Instructions below. Patient Instructions  Take Cardizem CD 240 mg every day  Your physician recommends that you schedule a follow-up appointment with Dr.Hochrein Tuesday 06/20/17 at 1:20 pm   Signed, Erma Heritage, PA-C  03/15/2017 8:52 PM    [address], [place], [place]  [address] Phone: [phone]; Fax: [phone]  [address], [place], [place] [address] Phone: [phone]

## 2017-03-20 DIAGNOSIS — I10 Essential (primary) hypertension: Secondary | ICD-10-CM | POA: Diagnosis not present

## 2017-03-20 DIAGNOSIS — R293 Abnormal posture: Secondary | ICD-10-CM | POA: Diagnosis not present

## 2017-03-20 DIAGNOSIS — Z5181 Encounter for therapeutic drug level monitoring: Secondary | ICD-10-CM | POA: Diagnosis not present

## 2017-03-20 DIAGNOSIS — R1312 Dysphagia, oropharyngeal phase: Secondary | ICD-10-CM | POA: Diagnosis not present

## 2017-03-20 DIAGNOSIS — J1089 Influenza due to other identified influenza virus with other manifestations: Secondary | ICD-10-CM | POA: Diagnosis not present

## 2017-03-20 DIAGNOSIS — R1311 Dysphagia, oral phase: Secondary | ICD-10-CM | POA: Diagnosis not present

## 2017-03-20 DIAGNOSIS — R296 Repeated falls: Secondary | ICD-10-CM | POA: Diagnosis not present

## 2017-03-20 DIAGNOSIS — R278 Other lack of coordination: Secondary | ICD-10-CM | POA: Diagnosis not present

## 2017-03-20 DIAGNOSIS — E1140 Type 2 diabetes mellitus with diabetic neuropathy, unspecified: Secondary | ICD-10-CM | POA: Diagnosis not present

## 2017-03-20 DIAGNOSIS — I482 Chronic atrial fibrillation: Secondary | ICD-10-CM | POA: Diagnosis not present

## 2017-03-20 DIAGNOSIS — M6281 Muscle weakness (generalized): Secondary | ICD-10-CM | POA: Diagnosis not present

## 2017-03-21 DIAGNOSIS — R278 Other lack of coordination: Secondary | ICD-10-CM | POA: Diagnosis not present

## 2017-03-21 DIAGNOSIS — R1311 Dysphagia, oral phase: Secondary | ICD-10-CM | POA: Diagnosis not present

## 2017-03-21 DIAGNOSIS — Z5181 Encounter for therapeutic drug level monitoring: Secondary | ICD-10-CM | POA: Diagnosis not present

## 2017-03-21 DIAGNOSIS — J1089 Influenza due to other identified influenza virus with other manifestations: Secondary | ICD-10-CM | POA: Diagnosis not present

## 2017-03-21 DIAGNOSIS — R296 Repeated falls: Secondary | ICD-10-CM | POA: Diagnosis not present

## 2017-03-21 DIAGNOSIS — R1312 Dysphagia, oropharyngeal phase: Secondary | ICD-10-CM | POA: Diagnosis not present

## 2017-03-22 DIAGNOSIS — Z5181 Encounter for therapeutic drug level monitoring: Secondary | ICD-10-CM | POA: Diagnosis not present

## 2017-03-22 DIAGNOSIS — I10 Essential (primary) hypertension: Secondary | ICD-10-CM | POA: Diagnosis not present

## 2017-03-22 DIAGNOSIS — E119 Type 2 diabetes mellitus without complications: Secondary | ICD-10-CM | POA: Diagnosis not present

## 2017-03-22 DIAGNOSIS — R278 Other lack of coordination: Secondary | ICD-10-CM | POA: Diagnosis not present

## 2017-03-22 DIAGNOSIS — R1311 Dysphagia, oral phase: Secondary | ICD-10-CM | POA: Diagnosis not present

## 2017-03-22 DIAGNOSIS — M6281 Muscle weakness (generalized): Secondary | ICD-10-CM | POA: Diagnosis not present

## 2017-03-22 DIAGNOSIS — I4891 Unspecified atrial fibrillation: Secondary | ICD-10-CM | POA: Diagnosis not present

## 2017-03-22 DIAGNOSIS — J1089 Influenza due to other identified influenza virus with other manifestations: Secondary | ICD-10-CM | POA: Diagnosis not present

## 2017-03-22 DIAGNOSIS — R296 Repeated falls: Secondary | ICD-10-CM | POA: Diagnosis not present

## 2017-03-22 DIAGNOSIS — R1312 Dysphagia, oropharyngeal phase: Secondary | ICD-10-CM | POA: Diagnosis not present

## 2017-03-22 DIAGNOSIS — F419 Anxiety disorder, unspecified: Secondary | ICD-10-CM | POA: Diagnosis not present

## 2017-03-23 ENCOUNTER — Telehealth: Payer: Self-pay

## 2017-03-23 DIAGNOSIS — R1311 Dysphagia, oral phase: Secondary | ICD-10-CM | POA: Diagnosis not present

## 2017-03-23 DIAGNOSIS — R296 Repeated falls: Secondary | ICD-10-CM | POA: Diagnosis not present

## 2017-03-23 DIAGNOSIS — Z5181 Encounter for therapeutic drug level monitoring: Secondary | ICD-10-CM | POA: Diagnosis not present

## 2017-03-23 DIAGNOSIS — R1312 Dysphagia, oropharyngeal phase: Secondary | ICD-10-CM | POA: Diagnosis not present

## 2017-03-23 DIAGNOSIS — L299 Pruritus, unspecified: Secondary | ICD-10-CM

## 2017-03-23 DIAGNOSIS — R278 Other lack of coordination: Secondary | ICD-10-CM | POA: Diagnosis not present

## 2017-03-23 DIAGNOSIS — J1089 Influenza due to other identified influenza virus with other manifestations: Secondary | ICD-10-CM | POA: Diagnosis not present

## 2017-03-23 NOTE — Telephone Encounter (Signed)
A new order was placed and faxed to the nursing home. Advised Lynn([facility] about the new order to be done with oxygen.

## 2017-03-23 NOTE — Telephone Encounter (Signed)
If she drops down to the 50s without oxygen at nighttime then there is no need to obtain a nocturnal oxymetry test without oxygen. But she needs a nocturnal oxymetry study WITH her current flow of Oxygen to determine if she has adequate amount of delivered oxygen.

## 2017-03-23 NOTE — Telephone Encounter (Signed)
Lynn-from [facility] called to determine if the patient still needed to perform a nocturnal oximetry test. She stated that when the patient comes off of her oxygen, her O2 saturation levels drop to the 50's. She wanted to make sure if it "was really necessary" to go through this test. Please advise.   When returning call, we may ask to speak with Jeani Hawking or Wells Guiles (pt's nurse).

## 2017-03-24 DIAGNOSIS — R278 Other lack of coordination: Secondary | ICD-10-CM | POA: Diagnosis not present

## 2017-03-24 DIAGNOSIS — I4891 Unspecified atrial fibrillation: Secondary | ICD-10-CM | POA: Diagnosis not present

## 2017-03-24 DIAGNOSIS — I10 Essential (primary) hypertension: Secondary | ICD-10-CM | POA: Diagnosis not present

## 2017-03-24 DIAGNOSIS — M6281 Muscle weakness (generalized): Secondary | ICD-10-CM | POA: Diagnosis not present

## 2017-03-24 DIAGNOSIS — R1312 Dysphagia, oropharyngeal phase: Secondary | ICD-10-CM | POA: Diagnosis not present

## 2017-03-24 DIAGNOSIS — Z5181 Encounter for therapeutic drug level monitoring: Secondary | ICD-10-CM | POA: Diagnosis not present

## 2017-03-24 DIAGNOSIS — R05 Cough: Secondary | ICD-10-CM | POA: Diagnosis not present

## 2017-03-24 DIAGNOSIS — R296 Repeated falls: Secondary | ICD-10-CM | POA: Diagnosis not present

## 2017-03-24 DIAGNOSIS — R1311 Dysphagia, oral phase: Secondary | ICD-10-CM | POA: Diagnosis not present

## 2017-03-24 DIAGNOSIS — J1089 Influenza due to other identified influenza virus with other manifestations: Secondary | ICD-10-CM | POA: Diagnosis not present

## 2017-03-27 DIAGNOSIS — Z5181 Encounter for therapeutic drug level monitoring: Secondary | ICD-10-CM | POA: Diagnosis not present

## 2017-03-27 DIAGNOSIS — R0602 Shortness of breath: Secondary | ICD-10-CM | POA: Diagnosis not present

## 2017-03-27 DIAGNOSIS — R278 Other lack of coordination: Secondary | ICD-10-CM | POA: Diagnosis not present

## 2017-03-27 DIAGNOSIS — R1312 Dysphagia, oropharyngeal phase: Secondary | ICD-10-CM | POA: Diagnosis not present

## 2017-03-27 DIAGNOSIS — I10 Essential (primary) hypertension: Secondary | ICD-10-CM | POA: Diagnosis not present

## 2017-03-27 DIAGNOSIS — R1311 Dysphagia, oral phase: Secondary | ICD-10-CM | POA: Diagnosis not present

## 2017-03-27 DIAGNOSIS — J1089 Influenza due to other identified influenza virus with other manifestations: Secondary | ICD-10-CM | POA: Diagnosis not present

## 2017-03-27 DIAGNOSIS — D649 Anemia, unspecified: Secondary | ICD-10-CM | POA: Diagnosis not present

## 2017-03-27 DIAGNOSIS — R296 Repeated falls: Secondary | ICD-10-CM | POA: Diagnosis not present

## 2017-03-28 DIAGNOSIS — R296 Repeated falls: Secondary | ICD-10-CM | POA: Diagnosis not present

## 2017-03-28 DIAGNOSIS — J1089 Influenza due to other identified influenza virus with other manifestations: Secondary | ICD-10-CM | POA: Diagnosis not present

## 2017-03-28 DIAGNOSIS — R278 Other lack of coordination: Secondary | ICD-10-CM | POA: Diagnosis not present

## 2017-03-28 DIAGNOSIS — R1312 Dysphagia, oropharyngeal phase: Secondary | ICD-10-CM | POA: Diagnosis not present

## 2017-03-28 DIAGNOSIS — R1311 Dysphagia, oral phase: Secondary | ICD-10-CM | POA: Diagnosis not present

## 2017-03-28 DIAGNOSIS — Z5181 Encounter for therapeutic drug level monitoring: Secondary | ICD-10-CM | POA: Diagnosis not present

## 2017-03-29 DIAGNOSIS — R0602 Shortness of breath: Secondary | ICD-10-CM | POA: Diagnosis not present

## 2017-03-29 DIAGNOSIS — M79671 Pain in right foot: Secondary | ICD-10-CM | POA: Diagnosis not present

## 2017-03-29 DIAGNOSIS — R062 Wheezing: Secondary | ICD-10-CM | POA: Diagnosis not present

## 2017-03-29 DIAGNOSIS — E039 Hypothyroidism, unspecified: Secondary | ICD-10-CM | POA: Diagnosis not present

## 2017-03-29 DIAGNOSIS — D649 Anemia, unspecified: Secondary | ICD-10-CM | POA: Diagnosis not present

## 2017-03-29 DIAGNOSIS — L988 Other specified disorders of the skin and subcutaneous tissue: Secondary | ICD-10-CM | POA: Diagnosis not present

## 2017-03-29 DIAGNOSIS — M25571 Pain in right ankle and joints of right foot: Secondary | ICD-10-CM | POA: Diagnosis not present

## 2017-03-29 DIAGNOSIS — R1311 Dysphagia, oral phase: Secondary | ICD-10-CM | POA: Diagnosis not present

## 2017-03-29 DIAGNOSIS — R296 Repeated falls: Secondary | ICD-10-CM | POA: Diagnosis not present

## 2017-03-29 DIAGNOSIS — J209 Acute bronchitis, unspecified: Secondary | ICD-10-CM | POA: Diagnosis not present

## 2017-03-29 DIAGNOSIS — I4891 Unspecified atrial fibrillation: Secondary | ICD-10-CM | POA: Diagnosis not present

## 2017-03-29 DIAGNOSIS — E119 Type 2 diabetes mellitus without complications: Secondary | ICD-10-CM | POA: Diagnosis not present

## 2017-03-29 DIAGNOSIS — R278 Other lack of coordination: Secondary | ICD-10-CM | POA: Diagnosis not present

## 2017-03-29 DIAGNOSIS — J1089 Influenza due to other identified influenza virus with other manifestations: Secondary | ICD-10-CM | POA: Diagnosis not present

## 2017-03-29 DIAGNOSIS — R0600 Dyspnea, unspecified: Secondary | ICD-10-CM | POA: Diagnosis not present

## 2017-03-29 DIAGNOSIS — Z5181 Encounter for therapeutic drug level monitoring: Secondary | ICD-10-CM | POA: Diagnosis not present

## 2017-03-29 DIAGNOSIS — R1312 Dysphagia, oropharyngeal phase: Secondary | ICD-10-CM | POA: Diagnosis not present

## 2017-03-30 DIAGNOSIS — Z5181 Encounter for therapeutic drug level monitoring: Secondary | ICD-10-CM | POA: Diagnosis not present

## 2017-03-30 DIAGNOSIS — J1089 Influenza due to other identified influenza virus with other manifestations: Secondary | ICD-10-CM | POA: Diagnosis not present

## 2017-03-30 DIAGNOSIS — R278 Other lack of coordination: Secondary | ICD-10-CM | POA: Diagnosis not present

## 2017-03-30 DIAGNOSIS — R1312 Dysphagia, oropharyngeal phase: Secondary | ICD-10-CM | POA: Diagnosis not present

## 2017-03-30 DIAGNOSIS — R296 Repeated falls: Secondary | ICD-10-CM | POA: Diagnosis not present

## 2017-03-30 DIAGNOSIS — R1311 Dysphagia, oral phase: Secondary | ICD-10-CM | POA: Diagnosis not present

## 2017-03-31 DIAGNOSIS — E039 Hypothyroidism, unspecified: Secondary | ICD-10-CM | POA: Diagnosis not present

## 2017-04-03 DIAGNOSIS — Z5181 Encounter for therapeutic drug level monitoring: Secondary | ICD-10-CM | POA: Diagnosis not present

## 2017-04-03 DIAGNOSIS — J1089 Influenza due to other identified influenza virus with other manifestations: Secondary | ICD-10-CM | POA: Diagnosis not present

## 2017-04-03 DIAGNOSIS — R296 Repeated falls: Secondary | ICD-10-CM | POA: Diagnosis not present

## 2017-04-03 DIAGNOSIS — R1311 Dysphagia, oral phase: Secondary | ICD-10-CM | POA: Diagnosis not present

## 2017-04-03 DIAGNOSIS — R278 Other lack of coordination: Secondary | ICD-10-CM | POA: Diagnosis not present

## 2017-04-03 DIAGNOSIS — R1312 Dysphagia, oropharyngeal phase: Secondary | ICD-10-CM | POA: Diagnosis not present

## 2017-04-04 DIAGNOSIS — J1089 Influenza due to other identified influenza virus with other manifestations: Secondary | ICD-10-CM | POA: Diagnosis not present

## 2017-04-04 DIAGNOSIS — R278 Other lack of coordination: Secondary | ICD-10-CM | POA: Diagnosis not present

## 2017-04-04 DIAGNOSIS — R296 Repeated falls: Secondary | ICD-10-CM | POA: Diagnosis not present

## 2017-04-04 DIAGNOSIS — R1311 Dysphagia, oral phase: Secondary | ICD-10-CM | POA: Diagnosis not present

## 2017-04-04 DIAGNOSIS — R1312 Dysphagia, oropharyngeal phase: Secondary | ICD-10-CM | POA: Diagnosis not present

## 2017-04-04 DIAGNOSIS — Z5181 Encounter for therapeutic drug level monitoring: Secondary | ICD-10-CM | POA: Diagnosis not present

## 2017-04-05 DIAGNOSIS — R296 Repeated falls: Secondary | ICD-10-CM | POA: Diagnosis not present

## 2017-04-05 DIAGNOSIS — R1312 Dysphagia, oropharyngeal phase: Secondary | ICD-10-CM | POA: Diagnosis not present

## 2017-04-05 DIAGNOSIS — R278 Other lack of coordination: Secondary | ICD-10-CM | POA: Diagnosis not present

## 2017-04-05 DIAGNOSIS — J1089 Influenza due to other identified influenza virus with other manifestations: Secondary | ICD-10-CM | POA: Diagnosis not present

## 2017-04-05 DIAGNOSIS — Z5181 Encounter for therapeutic drug level monitoring: Secondary | ICD-10-CM | POA: Diagnosis not present

## 2017-04-05 DIAGNOSIS — L988 Other specified disorders of the skin and subcutaneous tissue: Secondary | ICD-10-CM | POA: Diagnosis not present

## 2017-04-05 DIAGNOSIS — R1311 Dysphagia, oral phase: Secondary | ICD-10-CM | POA: Diagnosis not present

## 2017-04-06 DIAGNOSIS — R296 Repeated falls: Secondary | ICD-10-CM | POA: Diagnosis not present

## 2017-04-06 DIAGNOSIS — R1311 Dysphagia, oral phase: Secondary | ICD-10-CM | POA: Diagnosis not present

## 2017-04-06 DIAGNOSIS — I509 Heart failure, unspecified: Secondary | ICD-10-CM | POA: Diagnosis not present

## 2017-04-06 DIAGNOSIS — W19XXXD Unspecified fall, subsequent encounter: Secondary | ICD-10-CM | POA: Diagnosis not present

## 2017-04-06 DIAGNOSIS — J209 Acute bronchitis, unspecified: Secondary | ICD-10-CM | POA: Diagnosis not present

## 2017-04-06 DIAGNOSIS — R278 Other lack of coordination: Secondary | ICD-10-CM | POA: Diagnosis not present

## 2017-04-06 DIAGNOSIS — I4891 Unspecified atrial fibrillation: Secondary | ICD-10-CM | POA: Diagnosis not present

## 2017-04-06 DIAGNOSIS — J1089 Influenza due to other identified influenza virus with other manifestations: Secondary | ICD-10-CM | POA: Diagnosis not present

## 2017-04-06 DIAGNOSIS — Z5181 Encounter for therapeutic drug level monitoring: Secondary | ICD-10-CM | POA: Diagnosis not present

## 2017-04-06 DIAGNOSIS — R1312 Dysphagia, oropharyngeal phase: Secondary | ICD-10-CM | POA: Diagnosis not present

## 2017-04-07 DIAGNOSIS — R1312 Dysphagia, oropharyngeal phase: Secondary | ICD-10-CM | POA: Diagnosis not present

## 2017-04-07 DIAGNOSIS — Z5181 Encounter for therapeutic drug level monitoring: Secondary | ICD-10-CM | POA: Diagnosis not present

## 2017-04-07 DIAGNOSIS — R296 Repeated falls: Secondary | ICD-10-CM | POA: Diagnosis not present

## 2017-04-07 DIAGNOSIS — J1089 Influenza due to other identified influenza virus with other manifestations: Secondary | ICD-10-CM | POA: Diagnosis not present

## 2017-04-07 DIAGNOSIS — R1311 Dysphagia, oral phase: Secondary | ICD-10-CM | POA: Diagnosis not present

## 2017-04-07 DIAGNOSIS — R278 Other lack of coordination: Secondary | ICD-10-CM | POA: Diagnosis not present

## 2017-04-08 ENCOUNTER — Emergency Department (HOSPITAL_COMMUNITY): Payer: Medicare Other

## 2017-04-08 ENCOUNTER — Encounter (HOSPITAL_COMMUNITY): Payer: Self-pay | Admitting: Emergency Medicine

## 2017-04-08 ENCOUNTER — Emergency Department (HOSPITAL_COMMUNITY)
Admission: EM | Admit: 2017-04-08 | Discharge: 2017-04-09 | Disposition: A | Discharge status: Home / Self Care | Payer: Medicare Other | Attending: Emergency Medicine | Admitting: Emergency Medicine

## 2017-04-08 DIAGNOSIS — E039 Hypothyroidism, unspecified: Secondary | ICD-10-CM | POA: Diagnosis not present

## 2017-04-08 DIAGNOSIS — W0110XA Fall on same level from slipping, tripping and stumbling with subsequent striking against unspecified object, initial encounter: Secondary | ICD-10-CM | POA: Diagnosis not present

## 2017-04-08 DIAGNOSIS — S0990XA Unspecified injury of head, initial encounter: Secondary | ICD-10-CM | POA: Insufficient documentation

## 2017-04-08 DIAGNOSIS — E119 Type 2 diabetes mellitus without complications: Secondary | ICD-10-CM | POA: Insufficient documentation

## 2017-04-08 DIAGNOSIS — I2510 Atherosclerotic heart disease of native coronary artery without angina pectoris: Secondary | ICD-10-CM | POA: Insufficient documentation

## 2017-04-08 DIAGNOSIS — Y939 Activity, unspecified: Secondary | ICD-10-CM | POA: Insufficient documentation

## 2017-04-08 DIAGNOSIS — I10 Essential (primary) hypertension: Secondary | ICD-10-CM | POA: Insufficient documentation

## 2017-04-08 DIAGNOSIS — S82192A Other fracture of upper end of left tibia, initial encounter for closed fracture: Secondary | ICD-10-CM | POA: Insufficient documentation

## 2017-04-08 DIAGNOSIS — Z79899 Other long term (current) drug therapy: Secondary | ICD-10-CM | POA: Insufficient documentation

## 2017-04-08 DIAGNOSIS — Z794 Long term (current) use of insulin: Secondary | ICD-10-CM | POA: Insufficient documentation

## 2017-04-08 DIAGNOSIS — M25462 Effusion, left knee: Secondary | ICD-10-CM | POA: Diagnosis not present

## 2017-04-08 DIAGNOSIS — Z7901 Long term (current) use of anticoagulants: Secondary | ICD-10-CM | POA: Insufficient documentation

## 2017-04-08 DIAGNOSIS — Y999 Unspecified external cause status: Secondary | ICD-10-CM | POA: Diagnosis not present

## 2017-04-08 DIAGNOSIS — S0181XA Laceration without foreign body of other part of head, initial encounter: Secondary | ICD-10-CM | POA: Diagnosis not present

## 2017-04-08 DIAGNOSIS — Y929 Unspecified place or not applicable: Secondary | ICD-10-CM | POA: Insufficient documentation

## 2017-04-08 DIAGNOSIS — Z23 Encounter for immunization: Secondary | ICD-10-CM | POA: Insufficient documentation

## 2017-04-08 DIAGNOSIS — S82142A Displaced bicondylar fracture of left tibia, initial encounter for closed fracture: Secondary | ICD-10-CM

## 2017-04-08 DIAGNOSIS — S8992XA Unspecified injury of left lower leg, initial encounter: Secondary | ICD-10-CM | POA: Diagnosis present

## 2017-04-08 DIAGNOSIS — W19XXXA Unspecified fall, initial encounter: Secondary | ICD-10-CM

## 2017-04-08 DIAGNOSIS — S199XXA Unspecified injury of neck, initial encounter: Secondary | ICD-10-CM | POA: Diagnosis not present

## 2017-04-08 MED ORDER — ACETAMINOPHEN 325 MG PO TABS
650.0000 mg | ORAL_TABLET | Freq: Once | ORAL | Status: AC
  Administered 2017-04-08: 650 mg via ORAL
  Filled 2017-04-08: qty 2

## 2017-04-08 MED ORDER — TETANUS-DIPHTH-ACELL PERTUSSIS 5-2.5-18.5 LF-MCG/0.5 IM SUSP
0.5000 mL | Freq: Once | INTRAMUSCULAR | Status: AC
  Administered 2017-04-08: 0.5 mL via INTRAMUSCULAR
  Filled 2017-04-08: qty 0.5

## 2017-04-08 NOTE — Progress Notes (Signed)
Orthopedic Tech Progress Note Patient Details:  Joanna Hill 1932-01-08 [id number]  Ortho Devices Type of Ortho Device: Knee Immobilizer Ortho Device/Splint Location: lle Ortho Device/Splint Interventions: Ordered, Application, Adjustment   Karolee Stamps 04/08/2017, 9:36 PM

## 2017-04-08 NOTE — ED Provider Notes (Signed)
[place] DEPT Provider Note   CSN: [id number] Arrival date & time: 04/08/17  1747     History   Chief Complaint Chief Complaint  Patient presents with  . Fall    HPI Joanna Hill is a 83 y.o. female.  The history is provided by the patient. No language interpreter was used.  Fall  This is a new problem. The current episode started 1 to 2 hours ago. The problem occurs constantly. The problem has not changed since onset.Pertinent negatives include no chest pain. Nothing aggravates the symptoms. Nothing relieves the symptoms. She has tried nothing for the symptoms. The treatment provided no relief.  Pt reports she fell while bending over and hit her head and her knee.  Pt complains of a cut to her forehead and pain in her knee.   Past Medical History:  Diagnosis Date  . Atrial fibrillation with RVR ([place]) 02/28/2017  . Blindness   . Cancer ([place])    breast  . Carotid artery occlusion   . Dementia   . Diabetes mellitus   . GERD (gastroesophageal reflux disease)   . Hyperlipidemia   . Hypertension   . Hypothyroidism   . Stroke [facility]) 2009 and  2011   difficulty with swallowing    Patient Active Problem List   Diagnosis Date Noted  . Paroxysmal atrial fibrillation ([place]) 03/15/2017  . Current use of long term anticoagulation 03/15/2017  . Atrial fibrillation with RVR ([place]) 02/28/2017  . Hypothyroidism 02/28/2017  . Hypertension 02/28/2017  . History of CVA in adulthood 02/28/2017  . Dementia 02/28/2017  . Constipation 02/28/2017  . GERD (gastroesophageal reflux disease) 02/28/2017  . Diabetes mellitus type 2, insulin dependent ([place]) 02/28/2017  . Hyperkalemia 01/18/2014  . Occlusion and stenosis of carotid artery without mention of cerebral infarction 09/28/2011  . HYPOTENSION 08/05/2008  . DYSPHAGIA 08/05/2008  . ALTERED MENTAL STATUS 08/01/2008  . HYPERLIPIDEMIA 09/25/2007  . Essential hypertension 09/25/2007  . CORONARY ARTERY DISEASE 09/25/2007    Past  Surgical History:  Procedure Laterality Date  . ABDOMINAL HYSTERECTOMY    . Arch Aortogram  03-15-2010  . MASTECTOMY, PARTIAL Right   . ROTATOR CUFF REPAIR Left 1981    OB History    No data available       Home Medications    Prior to Admission medications   Medication Sig Start Date End Date Taking? Authorizing Provider  acetaminophen (TYLENOL) 325 MG tablet Take 650 mg by mouth every 6 (six) hours as needed for mild pain, moderate pain, fever or headache.     [provider]  antiseptic oral rinse (BIOTENE) LIQD 5 mLs by Mouth Rinse route 2 (two) times daily.    [provider]  ARTIFICIAL TEAR OP Place 1 drop into both eyes 4 (four) times daily.    [provider]  Carboxymethylcellulose Sodium 0.25 % SOLN Apply 1 drop to eye 4 (four) times daily.    [provider]  cetirizine (ZYRTEC) 10 MG tablet Take 10 mg by mouth daily.    [provider]  Colchicine 0.6 MG CAPS  12/15/16   [provider]  cyproheptadine (PERIACTIN) 4 MG tablet Take one tablet twice daily 07/12/16   Kozlow, Donnamarie Poag, MD  diltiazem (CARDIZEM CD) 240 MG 24 hr capsule Take 1 capsule (240 mg total) by mouth daily. 03/15/17   Strader, Fransisco Hertz, PA-C  diphenhydrAMINE (BENADRYL) 25 mg capsule Take 25 mg by mouth every 6 (six) hours as needed.  [provider]  docusate sodium (COLACE) 100 MG capsule Take 100 mg by mouth 2 (two) times daily.    [provider]  donepezil (ARICEPT) 10 MG tablet Take 10 mg by mouth at bedtime.    [provider]  Eyelid Cleansers (OCUSOFT LID SCRUB) PADS Apply 1 each topically 2 (two) times daily.    [provider]  fluticasone (FLONASE) 50 MCG/ACT nasal spray Place 1 spray into both nostrils 2 (two) times daily.     [provider]  Fluvoxamine Maleate (LUVOX CR) 150 MG CP24 Take 150 mg by mouth at bedtime.    [provider]  gabapentin (NEURONTIN) 300 MG capsule Take 300 mg  by mouth 2 (two) times daily.    [provider]  hypromellose (SYSTANE OVERNIGHT THERAPY) 0.3 % GEL ophthalmic ointment Place 1 application into both eyes at bedtime.    [provider]  ketotifen (ZADITOR) 0.025 % ophthalmic solution Place 1 drop into both eyes 2 (two) times daily.     [provider]  LEVEMIR FLEXTOUCH 100 UNIT/ML Pen Inject 24 Units into the skin daily at 10 pm. 03/02/17   Rosita Fire, MD  levothyroxine (SYNTHROID, LEVOTHROID) 100 MCG tablet Take 1 tablet (100 mcg total) by mouth daily before breakfast. 03/03/17   Rosita Fire, MD  metoprolol succinate (TOPROL-XL) 25 MG 24 hr tablet  02/08/17   [provider]  mirtazapine (REMERON) 15 MG tablet Take 15 mg by mouth at bedtime.    [provider]  neomycin-polymyxin-dexameth (MAXITROL) 0.1 % OINT Place 1 application into both eyes at bedtime.    [provider]  pantoprazole (PROTONIX) 40 MG tablet  11/21/16   [provider]  polyethylene glycol (MIRALAX / GLYCOLAX) packet Take 17 g by mouth daily as needed. 03/02/17   Rosita Fire, MD  pseudoephedrine-guaifenesin [facility] D) 60-600 MG 12 hr tablet Take 1 tablet by mouth every 12 (twelve) hours.    [provider]  ranitidine (ZANTAC) 150 MG tablet Take 150 mg by mouth 2 (two) times daily.    [provider]  Rivaroxaban (XARELTO) 15 MG TABS tablet Take 15 mg by mouth daily with supper.    [provider]  sucralfate (CARAFATE) 1 g tablet Take 1 g by mouth 4 (four) times daily.  01/20/16   [provider]  triamcinolone cream (KENALOG) 0.5 %  10/14/16   [provider]  UNABLE TO FIND Place 1 application into both eyes at bedtime. Vaseline Petroleum jelly    [provider]    Family History Family History  Problem Relation Age of Onset  . Cancer Mother   . Heart disease Father        Heart disease before age 57  . Asthma Sister   .  Diabetes Sister   . Breast cancer Sister   . Diabetes Sister   . Diabetes Sister   . Diabetes Sister   . Diabetes Sister   . Diabetes Sister   . Diabetes Sister   . Diabetes Sister   . Diabetes Sister     Social History Social History  Substance Use Topics  . Smoking status: Never Smoker  . Smokeless tobacco: Former Systems developer    Types: Snuff  . Alcohol use No     Allergies   Patient has no known allergies.   Review of Systems Review of Systems  Cardiovascular: Negative for chest pain.  All other systems reviewed and are negative.  Physical Exam Updated Vital Signs BP 124/66 (BP Location: Right Arm)   Pulse 87   Temp 98.3 F (36.8 C) (Oral)   Resp 16   SpO2 98%   Physical Exam  Constitutional: She appears well-developed and well-nourished.  HENT:  Head: Normocephalic.  Right Ear: External ear normal.  2cm superficail abrasion/laceration forehead,  No gapping.    Eyes: Pupils are equal, round, and reactive to light. Conjunctivae and EOM are normal.  Neck: Normal range of motion.  Cardiovascular: Normal rate.   Pulmonary/Chest: Effort normal.  Abdominal: Soft.  Musculoskeletal: Normal range of motion. She exhibits tenderness.  Tender left knee,  Pain with range of motion, swollen,  nv and ns intact  Neurological: She is alert.  Skin: Skin is warm.  Psychiatric: She has a normal mood and affect.  Nursing note and vitals reviewed.    ED Treatments / Results  Labs (all labs ordered are listed, but only abnormal results are displayed) Labs Reviewed - No data to display  EKG  EKG Interpretation None       Radiology Ct Head Wo Contrast  Result Date: 04/08/2017 CLINICAL DATA:  Unwitnessed fall while bending over to replaced rash PEG. No loss of consciousness. Laceration of the forehead with small hematoma. EXAM: CT HEAD WITHOUT CONTRAST CT CERVICAL SPINE WITHOUT CONTRAST TECHNIQUE: Multidetector CT imaging of the head and cervical spine was performed  following the standard protocol without intravenous contrast. Multiplanar CT image reconstructions of the cervical spine were also generated. COMPARISON:  05/04/2016 FINDINGS: CT HEAD FINDINGS Brain: Chronic left posterior parietal encephalomalacia with moderate degree of small vessel ischemia involving the periventricular and subcortical white matter. Old lacunar infarct of the left basal ganglia. Tiny chronic thalamic hypodensities are also noted consistent with chronic small lacunar infarcts. No new large vascular territory infarction, intracranial mass, hemorrhage or midline shift. No extra-axial fluid collections. Vascular: Moderate atherosclerosis of the carotid siphons and both vertebral arteries with minimal basilar artery atherosclerosis. No hyperdense vessels. Skull: Negative for acute fracture or focal lesions. Sinuses/Orbits: Bilateral lens replacements of the globes. No acute orbital abnormality. Minimal mucosal thickening of the ethmoid sinus. Other: Forehead soft tissue swelling/contusion with laceration. CT CERVICAL SPINE FINDINGS Alignment: Normal cervical lordosis. Intact craniocervical relationship. Osteoarthritis of the atlantodental interval with joint space narrowing and spurring. Skull base and vertebrae: No acute vertebral body fracture. No primary osseous lesion or focal pathologic process. Soft tissues and spinal canal: No prevertebral fluid or swelling. No visible canal hematoma. Disc levels: Cervical spondylosis with moderate to marked disc space narrowing C3-4, moderate C4-5 and C6-7 with small posterior marginal osteophytes. Slight neural foraminal encroachment at C3-4 and C4-5 bilaterally due to the osteophytes. No jumped or perched facets. Bilateral uncovertebral joint osteoarthritis with spurring is noted from C3 through C7. Upper chest: Atherosclerosis of the included aortic arch. Atelectasis and/or scarring bilaterally at the apices. Other: Bilateral extracranial carotid  arteriosclerosis. IMPRESSION: 1. Small forehead contusion and laceration without underlying fracture. 2. Chronic moderate small vessel ischemic disease of periventricular and subcortical white matter. 3. Chronic left posterior parietal encephalomalacia from remote infarct. Bilateral chronic thalamic and left basal ganglial lacunar infarcts. 4. No acute intracranial abnormality or skull fracture. 5. Cervical spondylosis without acute posttraumatic fracture or subluxation. Electronically Signed   By: Ashley Royalty M.D.   On: 04/08/2017 18:47   Ct Cervical Spine Wo Contrast  Result Date: 04/08/2017 CLINICAL DATA:  Unwitnessed fall while bending over to replaced rash PEG. No loss of consciousness. Laceration of  the forehead with small hematoma. EXAM: CT HEAD WITHOUT CONTRAST CT CERVICAL SPINE WITHOUT CONTRAST TECHNIQUE: Multidetector CT imaging of the head and cervical spine was performed following the standard protocol without intravenous contrast. Multiplanar CT image reconstructions of the cervical spine were also generated. COMPARISON:  05/04/2016 FINDINGS: CT HEAD FINDINGS Brain: Chronic left posterior parietal encephalomalacia with moderate degree of small vessel ischemia involving the periventricular and subcortical white matter. Old lacunar infarct of the left basal ganglia. Tiny chronic thalamic hypodensities are also noted consistent with chronic small lacunar infarcts. No new large vascular territory infarction, intracranial mass, hemorrhage or midline shift. No extra-axial fluid collections. Vascular: Moderate atherosclerosis of the carotid siphons and both vertebral arteries with minimal basilar artery atherosclerosis. No hyperdense vessels. Skull: Negative for acute fracture or focal lesions. Sinuses/Orbits: Bilateral lens replacements of the globes. No acute orbital abnormality. Minimal mucosal thickening of the ethmoid sinus. Other: Forehead soft tissue swelling/contusion with laceration. CT CERVICAL  SPINE FINDINGS Alignment: Normal cervical lordosis. Intact craniocervical relationship. Osteoarthritis of the atlantodental interval with joint space narrowing and spurring. Skull base and vertebrae: No acute vertebral body fracture. No primary osseous lesion or focal pathologic process. Soft tissues and spinal canal: No prevertebral fluid or swelling. No visible canal hematoma. Disc levels: Cervical spondylosis with moderate to marked disc space narrowing C3-4, moderate C4-5 and C6-7 with small posterior marginal osteophytes. Slight neural foraminal encroachment at C3-4 and C4-5 bilaterally due to the osteophytes. No jumped or perched facets. Bilateral uncovertebral joint osteoarthritis with spurring is noted from C3 through C7. Upper chest: Atherosclerosis of the included aortic arch. Atelectasis and/or scarring bilaterally at the apices. Other: Bilateral extracranial carotid arteriosclerosis. IMPRESSION: 1. Small forehead contusion and laceration without underlying fracture. 2. Chronic moderate small vessel ischemic disease of periventricular and subcortical white matter. 3. Chronic left posterior parietal encephalomalacia from remote infarct. Bilateral chronic thalamic and left basal ganglial lacunar infarcts. 4. No acute intracranial abnormality or skull fracture. 5. Cervical spondylosis without acute posttraumatic fracture or subluxation. Electronically Signed   By: Ashley Royalty M.D.   On: 04/08/2017 18:47   Dg Knee Complete 4 Views Left  Result Date: 04/08/2017 CLINICAL DATA:  Unwitnessed fall from wheelchair having anteromedial left knee pain. EXAM: LEFT KNEE - COMPLETE 4+ VIEW COMPARISON:  07/11/2013 FINDINGS: There is a moderate suprapatellar joint effusion with soft tissue swelling along the anterior medial aspect of the knee. Subtle lucency is noted along the medial tibial plateau and the AP image only and a nondisplaced fracture is not excluded. No significant depression is identified. IMPRESSION:  Moderate suprapatellar joint effusion with soft tissue swelling associated with possible nondisplaced fracture of the medial tibial plateau. No significant depression is identified. CT may help for better assessment as deemed clinically necessary. Electronically Signed   By: Ashley Royalty M.D.   On: 04/08/2017 18:56    Procedures Procedures (including critical care time)  Medications Ordered in ED Medications  Tdap (BOOSTRIX) injection 0.5 mL (0.5 mLs Intramuscular Given 04/08/17 1903)  acetaminophen (TYLENOL) tablet 650 mg (650 mg Oral Given 04/08/17 1903)     Initial Impression / Assessment and Plan / ED Course  I have reviewed the triage vital signs and the nursing notes.  Pertinent labs & imaging results that were available during my care of the patient were reviewed by me and considered in my medical decision making (see chart for details).     Ct possible fracture.  Pt placed in a knee imbolizer.  Pt advised to follow  up with Orthopaedist for recheck evaluation next week.  Pt advised she will take tylenol for pain.   Final Clinical Impressions(s) / ED Diagnoses   Final diagnoses:  Fall, initial encounter  Closed fracture of left tibial plateau, initial encounter    New Prescriptions New Prescriptions   No medications on file  An After Visit Summary was printed and given to the patient.   Fransico Meadow, PA-C 04/08/17 2354    Malvin Johns, MD 04/09/17 [phone]

## 2017-04-08 NOTE — Discharge Instructions (Signed)
Follow up with Dr. Stann Mainland for evaluation next week.  Ice to area of swelling.  Tylenol for pain.   You may have a fracture to the large bone in your knee

## 2017-04-08 NOTE — ED Triage Notes (Signed)
Pt from [facility] for unwitnessed fall while bending over to replace trash bag per patient. Pt states she did not lose consciousness. Pt has laceration to forehead with small hematoma. Pt is alert and oriented.

## 2017-04-08 NOTE — ED Notes (Signed)
Family contact - [phone] - Maryann Conners (Daughter)

## 2017-04-09 DIAGNOSIS — R4182 Altered mental status, unspecified: Secondary | ICD-10-CM | POA: Diagnosis not present

## 2017-04-09 DIAGNOSIS — S8990XA Unspecified injury of unspecified lower leg, initial encounter: Secondary | ICD-10-CM | POA: Diagnosis not present

## 2017-04-09 NOTE — ED Notes (Signed)
Report called to facility and PTAR. POA was called and updated on patients condition. NAD Noted. Paperwork from facility and ED given to [employer]

## 2017-04-09 NOTE — ED Notes (Signed)
Report given to Legrand Como, EMT, [facility]

## 2017-04-10 DIAGNOSIS — S82145D Nondisplaced bicondylar fracture of left tibia, subsequent encounter for closed fracture with routine healing: Secondary | ICD-10-CM | POA: Diagnosis not present

## 2017-04-10 DIAGNOSIS — I4891 Unspecified atrial fibrillation: Secondary | ICD-10-CM | POA: Diagnosis not present

## 2017-04-10 DIAGNOSIS — J209 Acute bronchitis, unspecified: Secondary | ICD-10-CM | POA: Diagnosis not present

## 2017-04-10 DIAGNOSIS — W19XXXD Unspecified fall, subsequent encounter: Secondary | ICD-10-CM | POA: Diagnosis not present

## 2017-04-11 DIAGNOSIS — D649 Anemia, unspecified: Secondary | ICD-10-CM | POA: Diagnosis not present

## 2017-04-11 DIAGNOSIS — I10 Essential (primary) hypertension: Secondary | ICD-10-CM | POA: Diagnosis not present

## 2017-04-12 ENCOUNTER — Ambulatory Visit: Payer: Medicare Other | Admitting: Physician Assistant

## 2017-04-12 DIAGNOSIS — F419 Anxiety disorder, unspecified: Secondary | ICD-10-CM | POA: Diagnosis not present

## 2017-04-12 DIAGNOSIS — F429 Obsessive-compulsive disorder, unspecified: Secondary | ICD-10-CM | POA: Diagnosis not present

## 2017-04-12 DIAGNOSIS — R1312 Dysphagia, oropharyngeal phase: Secondary | ICD-10-CM | POA: Diagnosis not present

## 2017-04-12 DIAGNOSIS — R296 Repeated falls: Secondary | ICD-10-CM | POA: Diagnosis not present

## 2017-04-12 DIAGNOSIS — L988 Other specified disorders of the skin and subcutaneous tissue: Secondary | ICD-10-CM | POA: Diagnosis not present

## 2017-04-12 DIAGNOSIS — J1089 Influenza due to other identified influenza virus with other manifestations: Secondary | ICD-10-CM | POA: Diagnosis not present

## 2017-04-12 DIAGNOSIS — Z5181 Encounter for therapeutic drug level monitoring: Secondary | ICD-10-CM | POA: Diagnosis not present

## 2017-04-12 DIAGNOSIS — R278 Other lack of coordination: Secondary | ICD-10-CM | POA: Diagnosis not present

## 2017-04-12 DIAGNOSIS — R1311 Dysphagia, oral phase: Secondary | ICD-10-CM | POA: Diagnosis not present

## 2017-04-12 DIAGNOSIS — F330 Major depressive disorder, recurrent, mild: Secondary | ICD-10-CM | POA: Diagnosis not present

## 2017-04-12 NOTE — Progress Notes (Deleted)
Cardiology Office Note   Date:  04/12/2017   ID:  Joanna Hill, DOB 09/17/1931, MRN [id number]  PCP:  System, Pcp Not In  Cardiologist:  Dr. Percival Hill, 03/01/2017 in the hospital  Joanna Hill, 03/15/2017 Joanna Hill, Joanna Marker, PA-C   No chief complaint on file.   History of Present Illness: Joanna Hill is a 83 y.o. female with a history of  atrial fibrillation (Xarelto), blindness, breast cancer, carotid stenosis, dementia, GERD, DM, HTN, HLD, hypothyroidism, CVA, CHADS2VASC= 7 (female, DM, HTN, age x 2, CVA x 2). Resident at [employer].  ER visit 08/25 for fall>>knee immobilizer placed for tibial fracture 03/15/2017 Cardizem uptitrated to 180 mg then 240 mg, instructions given to increase to 360 mg daily when necessary Admitted from 7/17 - 03/02/2017 for abdominal pain and tachycardia with HR in the 130's - 140's. Electrolytes were within normal limits. Troponin values remained flat and BNP was mildly elevated at 109. TSH was significantly elevated at 14.859, Synthroid increased. Echo w/ EF of 50-55%, no WMA, severely dilated LA, and moderate TR. Toprol-XL 25 mg discontinued, on Cardizem CD 120mg  daily at the time of discharge.    Joanna Hill presents for ***   Past Medical History:  Diagnosis Date  . Atrial fibrillation with RVR ([place]) 02/28/2017  . Blindness   . Cancer ([place])    breast  . Carotid artery occlusion   . Dementia   . Diabetes mellitus   . GERD (gastroesophageal reflux disease)   . Hyperlipidemia   . Hypertension   . Hypothyroidism   . Stroke [facility]) 2009 and  2011   difficulty with swallowing    Past Surgical History:  Procedure Laterality Date  . ABDOMINAL HYSTERECTOMY    . Arch Aortogram  03-15-2010  . MASTECTOMY, PARTIAL Right   . ROTATOR CUFF REPAIR Left 1981    Current Outpatient Prescriptions  Medication Sig Dispense Refill  . acetaminophen (TYLENOL) 325 MG tablet Take 650 mg by mouth every 6 (six) hours as needed for mild pain, moderate  pain, fever or headache.     Marland Kitchen antiseptic oral rinse (BIOTENE) LIQD 5 mLs by Mouth Rinse route 2 (two) times daily.    . ARTIFICIAL TEAR OP Place 1 drop into both eyes 4 (four) times daily.    . Carboxymethylcellulose Sodium 0.25 % SOLN Apply 1 drop to eye 4 (four) times daily.    . cetirizine (ZYRTEC) 10 MG tablet Take 10 mg by mouth daily.    . Colchicine 0.6 MG CAPS     . cyproheptadine (PERIACTIN) 4 MG tablet Take one tablet twice daily 60 tablet 5  . diltiazem (CARDIZEM CD) 240 MG 24 hr capsule Take 1 capsule (240 mg total) by mouth daily. 30 capsule 6  . diphenhydrAMINE (BENADRYL) 25 mg capsule Take 25 mg by mouth every 6 (six) hours as needed.    . docusate sodium (COLACE) 100 MG capsule Take 100 mg by mouth 2 (two) times daily.    Marland Kitchen donepezil (ARICEPT) 10 MG tablet Take 10 mg by mouth at bedtime.    . Eyelid Cleansers (OCUSOFT LID SCRUB) PADS Apply 1 each topically 2 (two) times daily.    . fluticasone (FLONASE) 50 MCG/ACT nasal spray Place 1 spray into both nostrils 2 (two) times daily.     . Fluvoxamine Maleate (LUVOX CR) 150 MG CP24 Take 150 mg by mouth at bedtime.    . furosemide (LASIX) 40 MG tablet Take 40 mg by mouth daily.    Marland Kitchen  gabapentin (NEURONTIN) 300 MG capsule Take 300 mg by mouth 2 (two) times daily.    . hypromellose (SYSTANE OVERNIGHT THERAPY) 0.3 % GEL ophthalmic ointment Place 1 application into both eyes at bedtime.    . insulin aspart (NOVOLOG) 100 UNIT/ML injection Inject into the skin daily. BL. MOD. ACCUCHECK WITH S/S USE NOVOLOG INSULIN  101-150 = 2U 151-200= 3U 201-250 = 5U 251-300 = 7U 310-350 = 9U >350 = 11U CALL MD FOR BS.400 OR < 60 Taken at 6am 11:30 am 14:30 and 21:00    . ketotifen (ZADITOR) 0.025 % ophthalmic solution Place 1 drop into both eyes 2 (two) times daily.     Marland Kitchen LEVEMIR FLEXTOUCH 100 UNIT/ML Pen Inject 24 Units into the skin daily at 10 pm. 15 mL 0  . levothyroxine (SYNTHROID, LEVOTHROID) 100 MCG tablet Take 1 tablet (100 mcg total) by  mouth daily before breakfast. 30 tablet 0  . metoprolol succinate (TOPROL-XL) 25 MG 24 hr tablet     . mirtazapine (REMERON) 15 MG tablet Take 15 mg by mouth at bedtime.    Marland Kitchen neomycin-polymyxin-dexameth (MAXITROL) 0.1 % OINT Place 1 application into both eyes at bedtime.    Marland Kitchen OVER THE COUNTER MEDICATION Place 1 application into both eyes at bedtime. Vaseline white petroleum Jelly    . pantoprazole (PROTONIX) 40 MG tablet Take 40 mg by mouth daily.     Joanna Hill Glycol-Propyl Glycol (SYSTANE) 0.4-0.3 % GEL ophthalmic gel Place 1 application into both eyes at bedtime.    . polyethylene glycol (MIRALAX / GLYCOLAX) packet Take 17 g by mouth daily as needed. 14 each 0  . predniSONE (STERAPRED UNI-PAK 21 TAB) 10 MG (21) TBPK tablet Take 10 mg by mouth once.    . pseudoephedrine-guaifenesin (MUCINEX D) 60-600 MG 12 hr tablet Take 1 tablet by mouth every 12 (twelve) hours.    . ranitidine (ZANTAC) 150 MG tablet Take 150 mg by mouth 2 (two) times daily.    . Rivaroxaban (XARELTO) 15 MG TABS tablet Take 15 mg by mouth daily with supper.    . sucralfate (CARAFATE) 1 g tablet Take 1 g by mouth 4 (four) times daily.     Marland Kitchen triamcinolone cream (KENALOG) 0.5 %     . UNABLE TO FIND Place 1 application into both eyes at bedtime. Vaseline Petroleum jelly     No current facility-administered medications for this visit.     Allergies:   Patient has no known allergies.    Social History:  The patient  reports that she has never smoked. She has quit using smokeless tobacco. Her smokeless tobacco use included Snuff. She reports that she does not drink alcohol or use drugs.   Family History:  The patient's family history includes Asthma in her sister; Breast cancer in her sister; Cancer in her mother; Diabetes in her sister, sister, sister, sister, sister, sister, sister, sister, and sister; Heart disease in her father.    ROS:  Please see the history of present illness. All other systems are reviewed and  negative.    PHYSICAL EXAM: VS:  There were no vitals taken for this visit. , BMI There is no height or weight on file to calculate BMI. GEN: Well nourished, well developed, female in no acute distress  HEENT: normal for age  Neck: no JVD, no carotid bruit, no masses Cardiac: RRR; no murmur, no rubs, or gallops Respiratory:  clear to auscultation bilaterally, normal work of breathing GI: soft, nontender, nondistended, + BS MS:  no deformity or atrophy; no edema; distal pulses are 2+ in all 4 extremities   Skin: warm and dry, no rash Neuro:  Strength and sensation are intact Psych: euthymic mood, full affect   EKG:  EKG {ACTION; IS/IS VAN:19166060} ordered today. The ekg ordered today demonstrates ***   Recent Labs: 02/28/2017: ALT 17; B Natriuretic Peptide 109.6; Magnesium 2.1 03/01/2017: BUN 32; Creatinine, Ser 1.13; Hemoglobin 9.2; Platelets 158; Potassium 4.1; Sodium 136; TSH 14.859    Lipid Panel No results found for: CHOL, TRIG, HDL, CHOLHDL, VLDL, LDLCALC, LDLDIRECT   Wt Readings from Last 3 Encounters:  03/15/17 150 lb 3.2 oz (68.1 kg)  03/02/17 157 lb 6.5 oz (71.4 kg)  05/04/16 159 lb (72.1 kg)     Other studies Reviewed: Additional studies/ records that were reviewed today include: ***.  ASSESSMENT AND PLAN:  1.  ***   Current medicines are reviewed at length with the patient today.  The patient {ACTIONS; HAS/DOES NOT HAVE:19233} concerns regarding medicines.  The following changes have been made:  {PLAN; NO CHANGE:13088:s}  Labs/ tests ordered today include: *** No orders of the defined types were placed in this encounter.    Disposition:   FU with Dr. Percival Hill  Signed, Lenoard Aden  04/12/2017 7:58 AM    [place] Phone: [phone]; Fax: [phone]  This note was written with the assistance of speech recognition software. Please excuse any transcriptional errors.

## 2017-04-13 DIAGNOSIS — R1312 Dysphagia, oropharyngeal phase: Secondary | ICD-10-CM | POA: Diagnosis not present

## 2017-04-13 DIAGNOSIS — R296 Repeated falls: Secondary | ICD-10-CM | POA: Diagnosis not present

## 2017-04-13 DIAGNOSIS — I509 Heart failure, unspecified: Secondary | ICD-10-CM | POA: Diagnosis not present

## 2017-04-13 DIAGNOSIS — R278 Other lack of coordination: Secondary | ICD-10-CM | POA: Diagnosis not present

## 2017-04-13 DIAGNOSIS — R1311 Dysphagia, oral phase: Secondary | ICD-10-CM | POA: Diagnosis not present

## 2017-04-13 DIAGNOSIS — J1089 Influenza due to other identified influenza virus with other manifestations: Secondary | ICD-10-CM | POA: Diagnosis not present

## 2017-04-13 DIAGNOSIS — S82145D Nondisplaced bicondylar fracture of left tibia, subsequent encounter for closed fracture with routine healing: Secondary | ICD-10-CM | POA: Diagnosis not present

## 2017-04-13 DIAGNOSIS — R21 Rash and other nonspecific skin eruption: Secondary | ICD-10-CM | POA: Diagnosis not present

## 2017-04-13 DIAGNOSIS — Z5181 Encounter for therapeutic drug level monitoring: Secondary | ICD-10-CM | POA: Diagnosis not present

## 2017-04-13 DIAGNOSIS — I10 Essential (primary) hypertension: Secondary | ICD-10-CM | POA: Diagnosis not present

## 2017-04-14 DIAGNOSIS — I4891 Unspecified atrial fibrillation: Secondary | ICD-10-CM | POA: Diagnosis not present

## 2017-04-14 DIAGNOSIS — R1312 Dysphagia, oropharyngeal phase: Secondary | ICD-10-CM | POA: Diagnosis not present

## 2017-04-14 DIAGNOSIS — F429 Obsessive-compulsive disorder, unspecified: Secondary | ICD-10-CM | POA: Diagnosis not present

## 2017-04-14 DIAGNOSIS — F419 Anxiety disorder, unspecified: Secondary | ICD-10-CM | POA: Diagnosis not present

## 2017-04-14 DIAGNOSIS — R1311 Dysphagia, oral phase: Secondary | ICD-10-CM | POA: Diagnosis not present

## 2017-04-14 DIAGNOSIS — R278 Other lack of coordination: Secondary | ICD-10-CM | POA: Diagnosis not present

## 2017-04-14 DIAGNOSIS — R296 Repeated falls: Secondary | ICD-10-CM | POA: Diagnosis not present

## 2017-04-14 DIAGNOSIS — Z5181 Encounter for therapeutic drug level monitoring: Secondary | ICD-10-CM | POA: Diagnosis not present

## 2017-04-14 DIAGNOSIS — S82145D Nondisplaced bicondylar fracture of left tibia, subsequent encounter for closed fracture with routine healing: Secondary | ICD-10-CM | POA: Diagnosis not present

## 2017-04-14 DIAGNOSIS — J1089 Influenza due to other identified influenza virus with other manifestations: Secondary | ICD-10-CM | POA: Diagnosis not present

## 2017-04-14 DIAGNOSIS — R21 Rash and other nonspecific skin eruption: Secondary | ICD-10-CM | POA: Diagnosis not present

## 2017-04-14 DIAGNOSIS — N183 Chronic kidney disease, stage 3 (moderate): Secondary | ICD-10-CM | POA: Diagnosis not present

## 2017-04-17 DIAGNOSIS — R1312 Dysphagia, oropharyngeal phase: Secondary | ICD-10-CM | POA: Diagnosis not present

## 2017-04-17 DIAGNOSIS — J1089 Influenza due to other identified influenza virus with other manifestations: Secondary | ICD-10-CM | POA: Diagnosis not present

## 2017-04-17 DIAGNOSIS — M6281 Muscle weakness (generalized): Secondary | ICD-10-CM | POA: Diagnosis not present

## 2017-04-17 DIAGNOSIS — R296 Repeated falls: Secondary | ICD-10-CM | POA: Diagnosis not present

## 2017-04-17 DIAGNOSIS — R1311 Dysphagia, oral phase: Secondary | ICD-10-CM | POA: Diagnosis not present

## 2017-04-17 DIAGNOSIS — Z5181 Encounter for therapeutic drug level monitoring: Secondary | ICD-10-CM | POA: Diagnosis not present

## 2017-04-17 DIAGNOSIS — I10 Essential (primary) hypertension: Secondary | ICD-10-CM | POA: Diagnosis not present

## 2017-04-17 DIAGNOSIS — R278 Other lack of coordination: Secondary | ICD-10-CM | POA: Diagnosis not present

## 2017-04-17 DIAGNOSIS — E1140 Type 2 diabetes mellitus with diabetic neuropathy, unspecified: Secondary | ICD-10-CM | POA: Diagnosis not present

## 2017-04-18 DIAGNOSIS — Z5181 Encounter for therapeutic drug level monitoring: Secondary | ICD-10-CM | POA: Diagnosis not present

## 2017-04-18 DIAGNOSIS — R1312 Dysphagia, oropharyngeal phase: Secondary | ICD-10-CM | POA: Diagnosis not present

## 2017-04-18 DIAGNOSIS — R296 Repeated falls: Secondary | ICD-10-CM | POA: Diagnosis not present

## 2017-04-18 DIAGNOSIS — R1311 Dysphagia, oral phase: Secondary | ICD-10-CM | POA: Diagnosis not present

## 2017-04-18 DIAGNOSIS — R278 Other lack of coordination: Secondary | ICD-10-CM | POA: Diagnosis not present

## 2017-04-18 DIAGNOSIS — J1089 Influenza due to other identified influenza virus with other manifestations: Secondary | ICD-10-CM | POA: Diagnosis not present

## 2017-04-19 DIAGNOSIS — Z5181 Encounter for therapeutic drug level monitoring: Secondary | ICD-10-CM | POA: Diagnosis not present

## 2017-04-19 DIAGNOSIS — R1311 Dysphagia, oral phase: Secondary | ICD-10-CM | POA: Diagnosis not present

## 2017-04-19 DIAGNOSIS — F419 Anxiety disorder, unspecified: Secondary | ICD-10-CM | POA: Diagnosis not present

## 2017-04-19 DIAGNOSIS — J1089 Influenza due to other identified influenza virus with other manifestations: Secondary | ICD-10-CM | POA: Diagnosis not present

## 2017-04-19 DIAGNOSIS — D649 Anemia, unspecified: Secondary | ICD-10-CM | POA: Diagnosis not present

## 2017-04-19 DIAGNOSIS — R278 Other lack of coordination: Secondary | ICD-10-CM | POA: Diagnosis not present

## 2017-04-19 DIAGNOSIS — R1312 Dysphagia, oropharyngeal phase: Secondary | ICD-10-CM | POA: Diagnosis not present

## 2017-04-19 DIAGNOSIS — R296 Repeated falls: Secondary | ICD-10-CM | POA: Diagnosis not present

## 2017-04-19 DIAGNOSIS — I10 Essential (primary) hypertension: Secondary | ICD-10-CM | POA: Diagnosis not present

## 2017-04-20 DIAGNOSIS — R1312 Dysphagia, oropharyngeal phase: Secondary | ICD-10-CM | POA: Diagnosis not present

## 2017-04-20 DIAGNOSIS — R1311 Dysphagia, oral phase: Secondary | ICD-10-CM | POA: Diagnosis not present

## 2017-04-20 DIAGNOSIS — E559 Vitamin D deficiency, unspecified: Secondary | ICD-10-CM | POA: Diagnosis not present

## 2017-04-20 DIAGNOSIS — D649 Anemia, unspecified: Secondary | ICD-10-CM | POA: Diagnosis not present

## 2017-04-20 DIAGNOSIS — S82145A Nondisplaced bicondylar fracture of left tibia, initial encounter for closed fracture: Secondary | ICD-10-CM | POA: Diagnosis not present

## 2017-04-20 DIAGNOSIS — E039 Hypothyroidism, unspecified: Secondary | ICD-10-CM | POA: Diagnosis not present

## 2017-04-20 DIAGNOSIS — R278 Other lack of coordination: Secondary | ICD-10-CM | POA: Diagnosis not present

## 2017-04-20 DIAGNOSIS — J1089 Influenza due to other identified influenza virus with other manifestations: Secondary | ICD-10-CM | POA: Diagnosis not present

## 2017-04-20 DIAGNOSIS — Z5181 Encounter for therapeutic drug level monitoring: Secondary | ICD-10-CM | POA: Diagnosis not present

## 2017-04-20 DIAGNOSIS — R296 Repeated falls: Secondary | ICD-10-CM | POA: Diagnosis not present

## 2017-04-20 DIAGNOSIS — E119 Type 2 diabetes mellitus without complications: Secondary | ICD-10-CM | POA: Diagnosis not present

## 2017-04-21 DIAGNOSIS — J1089 Influenza due to other identified influenza virus with other manifestations: Secondary | ICD-10-CM | POA: Diagnosis not present

## 2017-04-21 DIAGNOSIS — R296 Repeated falls: Secondary | ICD-10-CM | POA: Diagnosis not present

## 2017-04-21 DIAGNOSIS — R278 Other lack of coordination: Secondary | ICD-10-CM | POA: Diagnosis not present

## 2017-04-21 DIAGNOSIS — R1312 Dysphagia, oropharyngeal phase: Secondary | ICD-10-CM | POA: Diagnosis not present

## 2017-04-21 DIAGNOSIS — R1311 Dysphagia, oral phase: Secondary | ICD-10-CM | POA: Diagnosis not present

## 2017-04-21 DIAGNOSIS — Z5181 Encounter for therapeutic drug level monitoring: Secondary | ICD-10-CM | POA: Diagnosis not present

## 2017-04-23 DIAGNOSIS — R1312 Dysphagia, oropharyngeal phase: Secondary | ICD-10-CM | POA: Diagnosis not present

## 2017-04-23 DIAGNOSIS — J1089 Influenza due to other identified influenza virus with other manifestations: Secondary | ICD-10-CM | POA: Diagnosis not present

## 2017-04-23 DIAGNOSIS — R296 Repeated falls: Secondary | ICD-10-CM | POA: Diagnosis not present

## 2017-04-23 DIAGNOSIS — Z5181 Encounter for therapeutic drug level monitoring: Secondary | ICD-10-CM | POA: Diagnosis not present

## 2017-04-23 DIAGNOSIS — R278 Other lack of coordination: Secondary | ICD-10-CM | POA: Diagnosis not present

## 2017-04-23 DIAGNOSIS — R1311 Dysphagia, oral phase: Secondary | ICD-10-CM | POA: Diagnosis not present

## 2017-04-24 ENCOUNTER — Encounter: Payer: Self-pay | Admitting: Physician Assistant

## 2017-04-24 ENCOUNTER — Ambulatory Visit (INDEPENDENT_AMBULATORY_CARE_PROVIDER_SITE_OTHER): Payer: Medicare Other | Admitting: Physician Assistant

## 2017-04-24 VITALS — BP 119/74 | HR 94

## 2017-04-24 DIAGNOSIS — R1311 Dysphagia, oral phase: Secondary | ICD-10-CM | POA: Diagnosis not present

## 2017-04-24 DIAGNOSIS — R0789 Other chest pain: Secondary | ICD-10-CM | POA: Diagnosis not present

## 2017-04-24 DIAGNOSIS — Z5181 Encounter for therapeutic drug level monitoring: Secondary | ICD-10-CM | POA: Diagnosis not present

## 2017-04-24 DIAGNOSIS — E8770 Fluid overload, unspecified: Secondary | ICD-10-CM

## 2017-04-24 DIAGNOSIS — J1089 Influenza due to other identified influenza virus with other manifestations: Secondary | ICD-10-CM | POA: Diagnosis not present

## 2017-04-24 DIAGNOSIS — R1312 Dysphagia, oropharyngeal phase: Secondary | ICD-10-CM | POA: Diagnosis not present

## 2017-04-24 DIAGNOSIS — I481 Persistent atrial fibrillation: Secondary | ICD-10-CM | POA: Diagnosis not present

## 2017-04-24 DIAGNOSIS — R296 Repeated falls: Secondary | ICD-10-CM | POA: Diagnosis not present

## 2017-04-24 DIAGNOSIS — I4819 Other persistent atrial fibrillation: Secondary | ICD-10-CM

## 2017-04-24 DIAGNOSIS — I6523 Occlusion and stenosis of bilateral carotid arteries: Secondary | ICD-10-CM

## 2017-04-24 DIAGNOSIS — Z7901 Long term (current) use of anticoagulants: Secondary | ICD-10-CM | POA: Diagnosis not present

## 2017-04-24 DIAGNOSIS — I482 Chronic atrial fibrillation: Secondary | ICD-10-CM | POA: Diagnosis not present

## 2017-04-24 DIAGNOSIS — Z79899 Other long term (current) drug therapy: Secondary | ICD-10-CM | POA: Diagnosis not present

## 2017-04-24 DIAGNOSIS — G464 Cerebellar stroke syndrome: Secondary | ICD-10-CM | POA: Diagnosis not present

## 2017-04-24 DIAGNOSIS — R278 Other lack of coordination: Secondary | ICD-10-CM | POA: Diagnosis not present

## 2017-04-24 LAB — BASIC METABOLIC PANEL
BUN / CREAT RATIO: 17 (ref 12–28)
BUN: 16 mg/dL (ref 8–27)
CO2: 25 mmol/L (ref 20–29)
Calcium: 8.7 mg/dL (ref 8.7–10.3)
Chloride: 101 mmol/L (ref 96–106)
Creatinine, Ser: 0.94 mg/dL (ref 0.57–1.00)
GFR, EST AFRICAN AMERICAN: 65 mL/min/{1.73_m2} (ref >59)
GFR, EST NON AFRICAN AMERICAN: 57 mL/min/{1.73_m2} — AB (ref >59)
Glucose: 355 mg/dL — ABNORMAL HIGH (ref 65–99)
POTASSIUM: 5 mmol/L (ref 3.5–5.2)
SODIUM: 138 mmol/L (ref 134–144)

## 2017-04-24 NOTE — Progress Notes (Signed)
Cardiology Office Note   Date:  04/24/2017   ID:  Joanna Hill, DOB 1931/11/17, MRN [id number]  PCP:  System, Pcp Not In  Cardiologist:  Dr. Percival Spanish, in hospital, 03/01/2017 Joanna Hill, Joanna Marker, PA-C     History of Present Illness: Joanna Hill is a 83 y.o. female with a history of atrial fibrillation (Xarelto), blindness, breast cancer, carotid stenosis, dementia, diabetes, GERD hyperlipidemia, hypothyroidism, stroke.  Admitted 07/17-0 03/02/2017 for abdominal pain, cardiology saw her for atrial fib, rapid ventricular response. She was switched from beta blocker to Cardizem, is to stay on Xarelto. Mild troponin elevation felt secondary to demand ischemia, EF normal, no acute abdominal process noted ER visit 04/08/2017 after a fall, possible tibial fracture>> knee immobilizer  Joanna Hill presents for cardiology follow up.  She denies chest pain or SOB. She is not aware of her heart beating, never feels palpitations, denies presyncope or syncope.   She has not fallen since the ER visit, per records, that was a mechanical fall. She is non-weight bearing for 6 weeks, per ortho reports.   Weights not available, pt unable to stand.   Her memory is poor, she does not remember any arrhythmia.  She denies any bleeding issues.   She denies any chest pain, has not really been exerting herself much.   Past Medical History:  Diagnosis Date  . Atrial fibrillation with RVR ([place]) 02/28/2017  . Blindness   . Cancer ([place])    breast  . Carotid artery occlusion   . Dementia   . Diabetes mellitus   . GERD (gastroesophageal reflux disease)   . Hyperlipidemia   . Hypertension   . Hypothyroidism   . Stroke [facility]) 2009 and  2011   difficulty with swallowing    Past Surgical History:  Procedure Laterality Date  . ABDOMINAL HYSTERECTOMY    . Arch Aortogram  03-15-2010  . MASTECTOMY, PARTIAL Right   . ROTATOR CUFF REPAIR Left 1981    Current Outpatient Prescriptions  Medication Sig  Dispense Refill  . acetaminophen (TYLENOL) 325 MG tablet Take 650 mg by mouth every 6 (six) hours as needed for mild pain, moderate pain, fever or headache.     Marland Kitchen antiseptic oral rinse (BIOTENE) LIQD 5 mLs by Mouth Rinse route 2 (two) times daily.    . ARTIFICIAL TEAR OP Place 1 drop into both eyes 4 (four) times daily.    . Carboxymethylcellulose Sodium 0.25 % SOLN Apply 1 drop to eye 4 (four) times daily.    . cetirizine (ZYRTEC) 10 MG tablet Take 10 mg by mouth daily.    . Colchicine 0.6 MG CAPS     . cyproheptadine (PERIACTIN) 4 MG tablet Take one tablet twice daily 60 tablet 5  . diltiazem (CARDIZEM CD) 240 MG 24 hr capsule Take 1 capsule (240 mg total) by mouth daily. 30 capsule 6  . diphenhydrAMINE (BENADRYL) 25 mg capsule Take 25 mg by mouth every 6 (six) hours as needed.    . docusate sodium (COLACE) 100 MG capsule Take 100 mg by mouth 2 (two) times daily.    Marland Kitchen donepezil (ARICEPT) 10 MG tablet Take 10 mg by mouth at bedtime.    . Eyelid Cleansers (OCUSOFT LID SCRUB) PADS Apply 1 each topically 2 (two) times daily.    . fluticasone (FLONASE) 50 MCG/ACT nasal spray Place 1 spray into both nostrils 2 (two) times daily.     . Fluvoxamine Maleate (LUVOX CR) 150 MG CP24 Take 150 mg by  mouth at bedtime.    . furosemide (LASIX) 40 MG tablet Take 40 mg by mouth daily.    Marland Kitchen gabapentin (NEURONTIN) 300 MG capsule Take 300 mg by mouth 2 (two) times daily.    . hypromellose (SYSTANE OVERNIGHT THERAPY) 0.3 % GEL ophthalmic ointment Place 1 application into both eyes at bedtime.    . insulin aspart (NOVOLOG) 100 UNIT/ML injection Inject into the skin daily. BL. MOD. ACCUCHECK WITH S/S USE NOVOLOG INSULIN  101-150 = 2U 151-200= 3U 201-250 = 5U 251-300 = 7U 310-350 = 9U >350 = 11U CALL MD FOR BS.400 OR < 60 Taken at 6am 11:30 am 14:30 and 21:00    . ketotifen (ZADITOR) 0.025 % ophthalmic solution Place 1 drop into both eyes 2 (two) times daily.     Marland Kitchen LEVEMIR FLEXTOUCH 100 UNIT/ML Pen Inject 24  Units into the skin daily at 10 pm. 15 mL 0  . levothyroxine (SYNTHROID, LEVOTHROID) 100 MCG tablet Take 1 tablet (100 mcg total) by mouth daily before breakfast. 30 tablet 0  . metoprolol succinate (TOPROL-XL) 25 MG 24 hr tablet     . mirtazapine (REMERON) 15 MG tablet Take 15 mg by mouth at bedtime.    Marland Kitchen neomycin-polymyxin-dexameth (MAXITROL) 0.1 % OINT Place 1 application into both eyes at bedtime.    Marland Kitchen OVER THE COUNTER MEDICATION Place 1 application into both eyes at bedtime. Vaseline white petroleum Jelly    . pantoprazole (PROTONIX) 40 MG tablet Take 40 mg by mouth daily.     Vladimir Faster Glycol-Propyl Glycol (SYSTANE) 0.4-0.3 % GEL ophthalmic gel Place 1 application into both eyes at bedtime.    . polyethylene glycol (MIRALAX / GLYCOLAX) packet Take 17 g by mouth daily as needed. 14 each 0  . predniSONE (STERAPRED UNI-PAK 21 TAB) 10 MG (21) TBPK tablet Take 10 mg by mouth once.    . pseudoephedrine-guaifenesin (MUCINEX D) 60-600 MG 12 hr tablet Take 1 tablet by mouth every 12 (twelve) hours.    . ranitidine (ZANTAC) 150 MG tablet Take 150 mg by mouth 2 (two) times daily.    . Rivaroxaban (XARELTO) 15 MG TABS tablet Take 15 mg by mouth daily with supper.    . sucralfate (CARAFATE) 1 g tablet Take 1 g by mouth 4 (four) times daily.     Marland Kitchen triamcinolone cream (KENALOG) 0.5 %     . UNABLE TO FIND Place 1 application into both eyes at bedtime. Vaseline Petroleum jelly     No current facility-administered medications for this visit.     Allergies:   Patient has no known allergies.    Social History:  The patient  reports that she has never smoked. She has quit using smokeless tobacco. Her smokeless tobacco use included Snuff. She reports that she does not drink alcohol or use drugs.   Family History:  The patient's family history includes Asthma in her sister; Breast cancer in her sister; Cancer in her mother; Diabetes in her sister, sister, sister, sister, sister, sister, sister, sister, and  sister; Heart disease in her father.    ROS:  Please see the history of present illness. All other systems are reviewed and negative.    PHYSICAL EXAM: VS:  BP 119/74   Pulse 94   SpO2 98%  , BMI There is no height or weight on file to calculate BMI. GEN: Well nourished, elderly, female in no acute distress  HEENT: normal for age  Neck:  JVD 9 cm, no carotid bruit, no  masses Cardiac: slightly irreg R&R; soft murmur, no rubs, or gallops Respiratory: rales bases bilaterally, normal work of breathing GI: soft, nontender, nondistended, + BS MS: no deformity or atrophy; trace edema R, 1+ edema L; distal pulses are 2+ in all 4 extremities   Skin: warm and dry, no rash Neuro:  Strength and sensation are intact at her baseline Psych: euthymic mood, full affect   EKG:  EKG is ordered today. The ekg ordered today demonstrates Atrial flutter, HR 97; previous ECG w/ higher HR, flutter waves visible in more leads then but look the same in V1&2.   ECHO: 03/02/2017 - Left ventricle: The cavity size was normal. Wall thickness was   increased in a pattern of mild LVH. Systolic function was normal.   The estimated ejection fraction was in the range of 50% to 55%.   Wall motion was normal; there were no regional wall motion   abnormalities. - Aortic valve: Transvalvular velocity was within the normal range.   There was no stenosis. There was no regurgitation. - Mitral valve: There was no regurgitation. - Left atrium: The atrium was severely dilated. - Right ventricle: The cavity size was normal. Wall thickness was   normal. Systolic function was normal. - Right atrium: The atrium was moderately dilated. - Atrial septum: No defect or patent foramen ovale was identified. - Tricuspid valve: There was moderate regurgitation. - Pulmonary arteries: Systolic pressure was moderately increased.   PA peak pressure: 52 mm Hg (S). - Pericardium, extracardiac: A trivial pericardial effusion was  identified.   Recent Labs: 02/28/2017: ALT 17; B Natriuretic Peptide 109.6; Magnesium 2.1 03/01/2017: BUN 32; Creatinine, Ser 1.13; Hemoglobin 9.2; Platelets 158; Potassium 4.1; Sodium 136; TSH 14.859    Lipid Panel No results found for: CHOL, TRIG, HDL, CHOLHDL, VLDL, LDLCALC, LDLDIRECT   Wt Readings from Last 3 Encounters:  03/15/17 150 lb 3.2 oz (68.1 kg)  03/02/17 157 lb 6.5 oz (71.4 kg)  05/04/16 159 lb (72.1 kg)     Other studies Reviewed: Additional studies/ records that were reviewed today include: Office notes, hospital records and testing.  ASSESSMENT AND PLAN:  1.  PAF: Her heart rate is a little high, but I do not think her blood pressure would tolerate an increase in the Cardizem or in the Toprol-XL. Her activity level is poor. Continue current therapy  2. Chronic anticoagulation: She is on the Xarelto, tolerating it well. However, if she has additional falls, the risk of anticoagulation may outweigh the benefit.  3. Carotid stenosis: She had 40-59 percent of the right internal carotid and less than 40% stenosis of the left internal carotid and 2015. However, she is asymptomatic. Bruits are not noted on physical exam. Continue current therapy.  4. Volume overload: She has mild volume on exam. Her PAS was 62 on echocardiogram in July but diastolic dysfunction is not mentioned. I will increase her Lasix for 2 days and then go back to the previous dose.   Current medicines are reviewed at length with the patient today.  The patient does not have concerns regarding medicines.  The following changes have been made:  no change  Labs/ tests ordered today include:  No orders of the defined types were placed in this encounter.    Disposition:   FU with Dr. Percival Spanish  Signed, Rosaria Ferries, PA-C  04/24/2017 10:03 AM    Standard Phone: [phone]; Fax: [phone]  This note was written with the assistance of speech  recognition  software. Please excuse any transcriptional errors.

## 2017-04-24 NOTE — Patient Instructions (Signed)
Medication Instructions:  INCREASE LASIX 40MG  DAILY X2DAYS If you need a refill on your cardiac medications before your next appointment, please call your pharmacy.  Labwork: BMET TODAY HERE IN OUR OFFICE AT [facility]  Follow-Up: Your physician wants you to keep scheduled follow-up on: WITH DR [facility] 06-20-2017 @120PM .   Thank you for choosing [facility]!!

## 2017-04-25 DIAGNOSIS — J1089 Influenza due to other identified influenza virus with other manifestations: Secondary | ICD-10-CM | POA: Diagnosis not present

## 2017-04-25 DIAGNOSIS — R1311 Dysphagia, oral phase: Secondary | ICD-10-CM | POA: Diagnosis not present

## 2017-04-25 DIAGNOSIS — I10 Essential (primary) hypertension: Secondary | ICD-10-CM | POA: Diagnosis not present

## 2017-04-25 DIAGNOSIS — Z5181 Encounter for therapeutic drug level monitoring: Secondary | ICD-10-CM | POA: Diagnosis not present

## 2017-04-25 DIAGNOSIS — R079 Chest pain, unspecified: Secondary | ICD-10-CM | POA: Diagnosis not present

## 2017-04-25 DIAGNOSIS — R278 Other lack of coordination: Secondary | ICD-10-CM | POA: Diagnosis not present

## 2017-04-25 DIAGNOSIS — I509 Heart failure, unspecified: Secondary | ICD-10-CM | POA: Diagnosis not present

## 2017-04-25 DIAGNOSIS — R1312 Dysphagia, oropharyngeal phase: Secondary | ICD-10-CM | POA: Diagnosis not present

## 2017-04-25 DIAGNOSIS — R296 Repeated falls: Secondary | ICD-10-CM | POA: Diagnosis not present

## 2017-04-25 DIAGNOSIS — I4891 Unspecified atrial fibrillation: Secondary | ICD-10-CM | POA: Diagnosis not present

## 2017-04-26 DIAGNOSIS — R1312 Dysphagia, oropharyngeal phase: Secondary | ICD-10-CM | POA: Diagnosis not present

## 2017-04-26 DIAGNOSIS — R1311 Dysphagia, oral phase: Secondary | ICD-10-CM | POA: Diagnosis not present

## 2017-04-26 DIAGNOSIS — F419 Anxiety disorder, unspecified: Secondary | ICD-10-CM | POA: Diagnosis not present

## 2017-04-26 DIAGNOSIS — I10 Essential (primary) hypertension: Secondary | ICD-10-CM | POA: Diagnosis not present

## 2017-04-26 DIAGNOSIS — I509 Heart failure, unspecified: Secondary | ICD-10-CM | POA: Diagnosis not present

## 2017-04-26 DIAGNOSIS — S82145D Nondisplaced bicondylar fracture of left tibia, subsequent encounter for closed fracture with routine healing: Secondary | ICD-10-CM | POA: Diagnosis not present

## 2017-04-26 DIAGNOSIS — R278 Other lack of coordination: Secondary | ICD-10-CM | POA: Diagnosis not present

## 2017-04-26 DIAGNOSIS — I4891 Unspecified atrial fibrillation: Secondary | ICD-10-CM | POA: Diagnosis not present

## 2017-04-26 DIAGNOSIS — J1089 Influenza due to other identified influenza virus with other manifestations: Secondary | ICD-10-CM | POA: Diagnosis not present

## 2017-04-26 DIAGNOSIS — R296 Repeated falls: Secondary | ICD-10-CM | POA: Diagnosis not present

## 2017-04-26 DIAGNOSIS — Z5181 Encounter for therapeutic drug level monitoring: Secondary | ICD-10-CM | POA: Diagnosis not present

## 2017-04-27 DIAGNOSIS — J1089 Influenza due to other identified influenza virus with other manifestations: Secondary | ICD-10-CM | POA: Diagnosis not present

## 2017-04-27 DIAGNOSIS — R1311 Dysphagia, oral phase: Secondary | ICD-10-CM | POA: Diagnosis not present

## 2017-04-27 DIAGNOSIS — R278 Other lack of coordination: Secondary | ICD-10-CM | POA: Diagnosis not present

## 2017-04-27 DIAGNOSIS — R296 Repeated falls: Secondary | ICD-10-CM | POA: Diagnosis not present

## 2017-04-27 DIAGNOSIS — Z5181 Encounter for therapeutic drug level monitoring: Secondary | ICD-10-CM | POA: Diagnosis not present

## 2017-04-27 DIAGNOSIS — R1312 Dysphagia, oropharyngeal phase: Secondary | ICD-10-CM | POA: Diagnosis not present

## 2017-04-28 DIAGNOSIS — R278 Other lack of coordination: Secondary | ICD-10-CM | POA: Diagnosis not present

## 2017-04-28 DIAGNOSIS — R1312 Dysphagia, oropharyngeal phase: Secondary | ICD-10-CM | POA: Diagnosis not present

## 2017-04-28 DIAGNOSIS — Z5181 Encounter for therapeutic drug level monitoring: Secondary | ICD-10-CM | POA: Diagnosis not present

## 2017-04-28 DIAGNOSIS — R296 Repeated falls: Secondary | ICD-10-CM | POA: Diagnosis not present

## 2017-04-28 DIAGNOSIS — R1311 Dysphagia, oral phase: Secondary | ICD-10-CM | POA: Diagnosis not present

## 2017-04-28 DIAGNOSIS — J1089 Influenza due to other identified influenza virus with other manifestations: Secondary | ICD-10-CM | POA: Diagnosis not present

## 2017-05-01 DIAGNOSIS — R278 Other lack of coordination: Secondary | ICD-10-CM | POA: Diagnosis not present

## 2017-05-01 DIAGNOSIS — R1311 Dysphagia, oral phase: Secondary | ICD-10-CM | POA: Diagnosis not present

## 2017-05-01 DIAGNOSIS — Z5181 Encounter for therapeutic drug level monitoring: Secondary | ICD-10-CM | POA: Diagnosis not present

## 2017-05-01 DIAGNOSIS — J1089 Influenza due to other identified influenza virus with other manifestations: Secondary | ICD-10-CM | POA: Diagnosis not present

## 2017-05-01 DIAGNOSIS — I509 Heart failure, unspecified: Secondary | ICD-10-CM | POA: Diagnosis not present

## 2017-05-01 DIAGNOSIS — S82145D Nondisplaced bicondylar fracture of left tibia, subsequent encounter for closed fracture with routine healing: Secondary | ICD-10-CM | POA: Diagnosis not present

## 2017-05-01 DIAGNOSIS — R1312 Dysphagia, oropharyngeal phase: Secondary | ICD-10-CM | POA: Diagnosis not present

## 2017-05-01 DIAGNOSIS — I4891 Unspecified atrial fibrillation: Secondary | ICD-10-CM | POA: Diagnosis not present

## 2017-05-01 DIAGNOSIS — R296 Repeated falls: Secondary | ICD-10-CM | POA: Diagnosis not present

## 2017-05-01 DIAGNOSIS — L89892 Pressure ulcer of other site, stage 2: Secondary | ICD-10-CM | POA: Diagnosis not present

## 2017-05-02 DIAGNOSIS — J1089 Influenza due to other identified influenza virus with other manifestations: Secondary | ICD-10-CM | POA: Diagnosis not present

## 2017-05-02 DIAGNOSIS — R278 Other lack of coordination: Secondary | ICD-10-CM | POA: Diagnosis not present

## 2017-05-02 DIAGNOSIS — R1311 Dysphagia, oral phase: Secondary | ICD-10-CM | POA: Diagnosis not present

## 2017-05-02 DIAGNOSIS — Z5181 Encounter for therapeutic drug level monitoring: Secondary | ICD-10-CM | POA: Diagnosis not present

## 2017-05-02 DIAGNOSIS — R296 Repeated falls: Secondary | ICD-10-CM | POA: Diagnosis not present

## 2017-05-02 DIAGNOSIS — R1312 Dysphagia, oropharyngeal phase: Secondary | ICD-10-CM | POA: Diagnosis not present

## 2017-05-03 DIAGNOSIS — R278 Other lack of coordination: Secondary | ICD-10-CM | POA: Diagnosis not present

## 2017-05-03 DIAGNOSIS — S81802D Unspecified open wound, left lower leg, subsequent encounter: Secondary | ICD-10-CM | POA: Diagnosis not present

## 2017-05-03 DIAGNOSIS — R1312 Dysphagia, oropharyngeal phase: Secondary | ICD-10-CM | POA: Diagnosis not present

## 2017-05-03 DIAGNOSIS — Z5181 Encounter for therapeutic drug level monitoring: Secondary | ICD-10-CM | POA: Diagnosis not present

## 2017-05-03 DIAGNOSIS — R296 Repeated falls: Secondary | ICD-10-CM | POA: Diagnosis not present

## 2017-05-03 DIAGNOSIS — S81802A Unspecified open wound, left lower leg, initial encounter: Secondary | ICD-10-CM | POA: Diagnosis not present

## 2017-05-03 DIAGNOSIS — F419 Anxiety disorder, unspecified: Secondary | ICD-10-CM | POA: Diagnosis not present

## 2017-05-03 DIAGNOSIS — R1311 Dysphagia, oral phase: Secondary | ICD-10-CM | POA: Diagnosis not present

## 2017-05-03 DIAGNOSIS — J1089 Influenza due to other identified influenza virus with other manifestations: Secondary | ICD-10-CM | POA: Diagnosis not present

## 2017-05-05 DIAGNOSIS — Z5181 Encounter for therapeutic drug level monitoring: Secondary | ICD-10-CM | POA: Diagnosis not present

## 2017-05-05 DIAGNOSIS — I509 Heart failure, unspecified: Secondary | ICD-10-CM | POA: Diagnosis not present

## 2017-05-05 DIAGNOSIS — R278 Other lack of coordination: Secondary | ICD-10-CM | POA: Diagnosis not present

## 2017-05-05 DIAGNOSIS — I4891 Unspecified atrial fibrillation: Secondary | ICD-10-CM | POA: Diagnosis not present

## 2017-05-05 DIAGNOSIS — J1089 Influenza due to other identified influenza virus with other manifestations: Secondary | ICD-10-CM | POA: Diagnosis not present

## 2017-05-05 DIAGNOSIS — R296 Repeated falls: Secondary | ICD-10-CM | POA: Diagnosis not present

## 2017-05-05 DIAGNOSIS — R1311 Dysphagia, oral phase: Secondary | ICD-10-CM | POA: Diagnosis not present

## 2017-05-05 DIAGNOSIS — L89892 Pressure ulcer of other site, stage 2: Secondary | ICD-10-CM | POA: Diagnosis not present

## 2017-05-05 DIAGNOSIS — R1312 Dysphagia, oropharyngeal phase: Secondary | ICD-10-CM | POA: Diagnosis not present

## 2017-05-05 DIAGNOSIS — S82145D Nondisplaced bicondylar fracture of left tibia, subsequent encounter for closed fracture with routine healing: Secondary | ICD-10-CM | POA: Diagnosis not present

## 2017-05-06 DIAGNOSIS — D649 Anemia, unspecified: Secondary | ICD-10-CM | POA: Diagnosis not present

## 2017-05-06 DIAGNOSIS — Z5181 Encounter for therapeutic drug level monitoring: Secondary | ICD-10-CM | POA: Diagnosis not present

## 2017-05-06 DIAGNOSIS — J1089 Influenza due to other identified influenza virus with other manifestations: Secondary | ICD-10-CM | POA: Diagnosis not present

## 2017-05-06 DIAGNOSIS — I10 Essential (primary) hypertension: Secondary | ICD-10-CM | POA: Diagnosis not present

## 2017-05-06 DIAGNOSIS — R296 Repeated falls: Secondary | ICD-10-CM | POA: Diagnosis not present

## 2017-05-06 DIAGNOSIS — R1311 Dysphagia, oral phase: Secondary | ICD-10-CM | POA: Diagnosis not present

## 2017-05-06 DIAGNOSIS — R278 Other lack of coordination: Secondary | ICD-10-CM | POA: Diagnosis not present

## 2017-05-06 DIAGNOSIS — R1312 Dysphagia, oropharyngeal phase: Secondary | ICD-10-CM | POA: Diagnosis not present

## 2017-05-08 DIAGNOSIS — R278 Other lack of coordination: Secondary | ICD-10-CM | POA: Diagnosis not present

## 2017-05-08 DIAGNOSIS — R1312 Dysphagia, oropharyngeal phase: Secondary | ICD-10-CM | POA: Diagnosis not present

## 2017-05-08 DIAGNOSIS — R296 Repeated falls: Secondary | ICD-10-CM | POA: Diagnosis not present

## 2017-05-08 DIAGNOSIS — Z5181 Encounter for therapeutic drug level monitoring: Secondary | ICD-10-CM | POA: Diagnosis not present

## 2017-05-08 DIAGNOSIS — R1311 Dysphagia, oral phase: Secondary | ICD-10-CM | POA: Diagnosis not present

## 2017-05-08 DIAGNOSIS — J1089 Influenza due to other identified influenza virus with other manifestations: Secondary | ICD-10-CM | POA: Diagnosis not present

## 2017-05-09 DIAGNOSIS — J1089 Influenza due to other identified influenza virus with other manifestations: Secondary | ICD-10-CM | POA: Diagnosis not present

## 2017-05-09 DIAGNOSIS — Z5181 Encounter for therapeutic drug level monitoring: Secondary | ICD-10-CM | POA: Diagnosis not present

## 2017-05-09 DIAGNOSIS — R296 Repeated falls: Secondary | ICD-10-CM | POA: Diagnosis not present

## 2017-05-09 DIAGNOSIS — R278 Other lack of coordination: Secondary | ICD-10-CM | POA: Diagnosis not present

## 2017-05-09 DIAGNOSIS — R1312 Dysphagia, oropharyngeal phase: Secondary | ICD-10-CM | POA: Diagnosis not present

## 2017-05-09 DIAGNOSIS — R1311 Dysphagia, oral phase: Secondary | ICD-10-CM | POA: Diagnosis not present

## 2017-05-10 DIAGNOSIS — R296 Repeated falls: Secondary | ICD-10-CM | POA: Diagnosis not present

## 2017-05-10 DIAGNOSIS — R278 Other lack of coordination: Secondary | ICD-10-CM | POA: Diagnosis not present

## 2017-05-10 DIAGNOSIS — R1311 Dysphagia, oral phase: Secondary | ICD-10-CM | POA: Diagnosis not present

## 2017-05-10 DIAGNOSIS — J1089 Influenza due to other identified influenza virus with other manifestations: Secondary | ICD-10-CM | POA: Diagnosis not present

## 2017-05-10 DIAGNOSIS — Z5181 Encounter for therapeutic drug level monitoring: Secondary | ICD-10-CM | POA: Diagnosis not present

## 2017-05-10 DIAGNOSIS — F419 Anxiety disorder, unspecified: Secondary | ICD-10-CM | POA: Diagnosis not present

## 2017-05-10 DIAGNOSIS — R1312 Dysphagia, oropharyngeal phase: Secondary | ICD-10-CM | POA: Diagnosis not present

## 2017-05-11 DIAGNOSIS — S82145D Nondisplaced bicondylar fracture of left tibia, subsequent encounter for closed fracture with routine healing: Secondary | ICD-10-CM | POA: Diagnosis not present

## 2017-05-11 DIAGNOSIS — S81802A Unspecified open wound, left lower leg, initial encounter: Secondary | ICD-10-CM | POA: Diagnosis not present

## 2017-05-11 DIAGNOSIS — S81802D Unspecified open wound, left lower leg, subsequent encounter: Secondary | ICD-10-CM | POA: Diagnosis not present

## 2017-05-17 DIAGNOSIS — J45909 Unspecified asthma, uncomplicated: Secondary | ICD-10-CM | POA: Diagnosis not present

## 2017-05-17 DIAGNOSIS — R293 Abnormal posture: Secondary | ICD-10-CM | POA: Diagnosis not present

## 2017-05-17 DIAGNOSIS — R296 Repeated falls: Secondary | ICD-10-CM | POA: Diagnosis not present

## 2017-05-17 DIAGNOSIS — M6281 Muscle weakness (generalized): Secondary | ICD-10-CM | POA: Diagnosis not present

## 2017-05-17 DIAGNOSIS — S81802D Unspecified open wound, left lower leg, subsequent encounter: Secondary | ICD-10-CM | POA: Diagnosis not present

## 2017-05-17 DIAGNOSIS — I10 Essential (primary) hypertension: Secondary | ICD-10-CM | POA: Diagnosis not present

## 2017-05-17 DIAGNOSIS — R1312 Dysphagia, oropharyngeal phase: Secondary | ICD-10-CM | POA: Diagnosis not present

## 2017-05-17 DIAGNOSIS — R1311 Dysphagia, oral phase: Secondary | ICD-10-CM | POA: Diagnosis not present

## 2017-05-17 DIAGNOSIS — F419 Anxiety disorder, unspecified: Secondary | ICD-10-CM | POA: Diagnosis not present

## 2017-05-17 DIAGNOSIS — J1089 Influenza due to other identified influenza virus with other manifestations: Secondary | ICD-10-CM | POA: Diagnosis not present

## 2017-05-17 DIAGNOSIS — R278 Other lack of coordination: Secondary | ICD-10-CM | POA: Diagnosis not present

## 2017-05-17 DIAGNOSIS — Z5181 Encounter for therapeutic drug level monitoring: Secondary | ICD-10-CM | POA: Diagnosis not present

## 2017-05-17 DIAGNOSIS — S81802A Unspecified open wound, left lower leg, initial encounter: Secondary | ICD-10-CM | POA: Diagnosis not present

## 2017-05-18 DIAGNOSIS — R278 Other lack of coordination: Secondary | ICD-10-CM | POA: Diagnosis not present

## 2017-05-18 DIAGNOSIS — R296 Repeated falls: Secondary | ICD-10-CM | POA: Diagnosis not present

## 2017-05-18 DIAGNOSIS — R293 Abnormal posture: Secondary | ICD-10-CM | POA: Diagnosis not present

## 2017-05-18 DIAGNOSIS — J1089 Influenza due to other identified influenza virus with other manifestations: Secondary | ICD-10-CM | POA: Diagnosis not present

## 2017-05-18 DIAGNOSIS — Z5181 Encounter for therapeutic drug level monitoring: Secondary | ICD-10-CM | POA: Diagnosis not present

## 2017-05-18 DIAGNOSIS — R1311 Dysphagia, oral phase: Secondary | ICD-10-CM | POA: Diagnosis not present

## 2017-05-19 DIAGNOSIS — R293 Abnormal posture: Secondary | ICD-10-CM | POA: Diagnosis not present

## 2017-05-19 DIAGNOSIS — R278 Other lack of coordination: Secondary | ICD-10-CM | POA: Diagnosis not present

## 2017-05-19 DIAGNOSIS — Z5181 Encounter for therapeutic drug level monitoring: Secondary | ICD-10-CM | POA: Diagnosis not present

## 2017-05-19 DIAGNOSIS — R1311 Dysphagia, oral phase: Secondary | ICD-10-CM | POA: Diagnosis not present

## 2017-05-19 DIAGNOSIS — R296 Repeated falls: Secondary | ICD-10-CM | POA: Diagnosis not present

## 2017-05-19 DIAGNOSIS — J1089 Influenza due to other identified influenza virus with other manifestations: Secondary | ICD-10-CM | POA: Diagnosis not present

## 2017-05-22 DIAGNOSIS — Z5181 Encounter for therapeutic drug level monitoring: Secondary | ICD-10-CM | POA: Diagnosis not present

## 2017-05-22 DIAGNOSIS — R296 Repeated falls: Secondary | ICD-10-CM | POA: Diagnosis not present

## 2017-05-22 DIAGNOSIS — R278 Other lack of coordination: Secondary | ICD-10-CM | POA: Diagnosis not present

## 2017-05-22 DIAGNOSIS — R293 Abnormal posture: Secondary | ICD-10-CM | POA: Diagnosis not present

## 2017-05-22 DIAGNOSIS — R1311 Dysphagia, oral phase: Secondary | ICD-10-CM | POA: Diagnosis not present

## 2017-05-22 DIAGNOSIS — J1089 Influenza due to other identified influenza virus with other manifestations: Secondary | ICD-10-CM | POA: Diagnosis not present

## 2017-05-23 DIAGNOSIS — R293 Abnormal posture: Secondary | ICD-10-CM | POA: Diagnosis not present

## 2017-05-23 DIAGNOSIS — Z5181 Encounter for therapeutic drug level monitoring: Secondary | ICD-10-CM | POA: Diagnosis not present

## 2017-05-23 DIAGNOSIS — J1089 Influenza due to other identified influenza virus with other manifestations: Secondary | ICD-10-CM | POA: Diagnosis not present

## 2017-05-23 DIAGNOSIS — R296 Repeated falls: Secondary | ICD-10-CM | POA: Diagnosis not present

## 2017-05-23 DIAGNOSIS — R278 Other lack of coordination: Secondary | ICD-10-CM | POA: Diagnosis not present

## 2017-05-23 DIAGNOSIS — R1311 Dysphagia, oral phase: Secondary | ICD-10-CM | POA: Diagnosis not present

## 2017-05-24 DIAGNOSIS — S81802A Unspecified open wound, left lower leg, initial encounter: Secondary | ICD-10-CM | POA: Diagnosis not present

## 2017-05-24 DIAGNOSIS — R296 Repeated falls: Secondary | ICD-10-CM | POA: Diagnosis not present

## 2017-05-24 DIAGNOSIS — J1089 Influenza due to other identified influenza virus with other manifestations: Secondary | ICD-10-CM | POA: Diagnosis not present

## 2017-05-24 DIAGNOSIS — I4891 Unspecified atrial fibrillation: Secondary | ICD-10-CM | POA: Diagnosis not present

## 2017-05-24 DIAGNOSIS — S82142A Displaced bicondylar fracture of left tibia, initial encounter for closed fracture: Secondary | ICD-10-CM | POA: Diagnosis not present

## 2017-05-24 DIAGNOSIS — R293 Abnormal posture: Secondary | ICD-10-CM | POA: Diagnosis not present

## 2017-05-24 DIAGNOSIS — F0390 Unspecified dementia without behavioral disturbance: Secondary | ICD-10-CM | POA: Diagnosis not present

## 2017-05-24 DIAGNOSIS — E039 Hypothyroidism, unspecified: Secondary | ICD-10-CM | POA: Diagnosis not present

## 2017-05-24 DIAGNOSIS — Z5181 Encounter for therapeutic drug level monitoring: Secondary | ICD-10-CM | POA: Diagnosis not present

## 2017-05-24 DIAGNOSIS — R1311 Dysphagia, oral phase: Secondary | ICD-10-CM | POA: Diagnosis not present

## 2017-05-24 DIAGNOSIS — S81802D Unspecified open wound, left lower leg, subsequent encounter: Secondary | ICD-10-CM | POA: Diagnosis not present

## 2017-05-24 DIAGNOSIS — F330 Major depressive disorder, recurrent, mild: Secondary | ICD-10-CM | POA: Diagnosis not present

## 2017-05-24 DIAGNOSIS — G4700 Insomnia, unspecified: Secondary | ICD-10-CM | POA: Diagnosis not present

## 2017-05-24 DIAGNOSIS — F419 Anxiety disorder, unspecified: Secondary | ICD-10-CM | POA: Diagnosis not present

## 2017-05-24 DIAGNOSIS — F429 Obsessive-compulsive disorder, unspecified: Secondary | ICD-10-CM | POA: Diagnosis not present

## 2017-05-24 DIAGNOSIS — R278 Other lack of coordination: Secondary | ICD-10-CM | POA: Diagnosis not present

## 2017-05-25 DIAGNOSIS — R1311 Dysphagia, oral phase: Secondary | ICD-10-CM | POA: Diagnosis not present

## 2017-05-25 DIAGNOSIS — R296 Repeated falls: Secondary | ICD-10-CM | POA: Diagnosis not present

## 2017-05-25 DIAGNOSIS — J1089 Influenza due to other identified influenza virus with other manifestations: Secondary | ICD-10-CM | POA: Diagnosis not present

## 2017-05-25 DIAGNOSIS — Z5181 Encounter for therapeutic drug level monitoring: Secondary | ICD-10-CM | POA: Diagnosis not present

## 2017-05-25 DIAGNOSIS — R278 Other lack of coordination: Secondary | ICD-10-CM | POA: Diagnosis not present

## 2017-05-25 DIAGNOSIS — R293 Abnormal posture: Secondary | ICD-10-CM | POA: Diagnosis not present

## 2017-05-26 DIAGNOSIS — R1311 Dysphagia, oral phase: Secondary | ICD-10-CM | POA: Diagnosis not present

## 2017-05-26 DIAGNOSIS — R293 Abnormal posture: Secondary | ICD-10-CM | POA: Diagnosis not present

## 2017-05-26 DIAGNOSIS — J1089 Influenza due to other identified influenza virus with other manifestations: Secondary | ICD-10-CM | POA: Diagnosis not present

## 2017-05-26 DIAGNOSIS — R296 Repeated falls: Secondary | ICD-10-CM | POA: Diagnosis not present

## 2017-05-26 DIAGNOSIS — R278 Other lack of coordination: Secondary | ICD-10-CM | POA: Diagnosis not present

## 2017-05-26 DIAGNOSIS — Z5181 Encounter for therapeutic drug level monitoring: Secondary | ICD-10-CM | POA: Diagnosis not present

## 2017-05-29 DIAGNOSIS — Z5181 Encounter for therapeutic drug level monitoring: Secondary | ICD-10-CM | POA: Diagnosis not present

## 2017-05-29 DIAGNOSIS — R296 Repeated falls: Secondary | ICD-10-CM | POA: Diagnosis not present

## 2017-05-29 DIAGNOSIS — R1311 Dysphagia, oral phase: Secondary | ICD-10-CM | POA: Diagnosis not present

## 2017-05-29 DIAGNOSIS — R293 Abnormal posture: Secondary | ICD-10-CM | POA: Diagnosis not present

## 2017-05-29 DIAGNOSIS — R278 Other lack of coordination: Secondary | ICD-10-CM | POA: Diagnosis not present

## 2017-05-29 DIAGNOSIS — J1089 Influenza due to other identified influenza virus with other manifestations: Secondary | ICD-10-CM | POA: Diagnosis not present

## 2017-05-30 DIAGNOSIS — Z5181 Encounter for therapeutic drug level monitoring: Secondary | ICD-10-CM | POA: Diagnosis not present

## 2017-05-30 DIAGNOSIS — R296 Repeated falls: Secondary | ICD-10-CM | POA: Diagnosis not present

## 2017-05-30 DIAGNOSIS — R1311 Dysphagia, oral phase: Secondary | ICD-10-CM | POA: Diagnosis not present

## 2017-05-30 DIAGNOSIS — R278 Other lack of coordination: Secondary | ICD-10-CM | POA: Diagnosis not present

## 2017-05-30 DIAGNOSIS — J1089 Influenza due to other identified influenza virus with other manifestations: Secondary | ICD-10-CM | POA: Diagnosis not present

## 2017-05-30 DIAGNOSIS — R293 Abnormal posture: Secondary | ICD-10-CM | POA: Diagnosis not present

## 2017-05-31 ENCOUNTER — Ambulatory Visit (INDEPENDENT_AMBULATORY_CARE_PROVIDER_SITE_OTHER): Payer: Medicare Other | Admitting: Allergy and Immunology

## 2017-05-31 ENCOUNTER — Encounter: Payer: Self-pay | Admitting: Allergy and Immunology

## 2017-05-31 VITALS — BP 118/68 | HR 88 | Resp 16

## 2017-05-31 DIAGNOSIS — J3089 Other allergic rhinitis: Secondary | ICD-10-CM

## 2017-05-31 DIAGNOSIS — J1089 Influenza due to other identified influenza virus with other manifestations: Secondary | ICD-10-CM | POA: Diagnosis not present

## 2017-05-31 DIAGNOSIS — L299 Pruritus, unspecified: Secondary | ICD-10-CM | POA: Diagnosis not present

## 2017-05-31 DIAGNOSIS — Z5181 Encounter for therapeutic drug level monitoring: Secondary | ICD-10-CM | POA: Diagnosis not present

## 2017-05-31 DIAGNOSIS — F419 Anxiety disorder, unspecified: Secondary | ICD-10-CM | POA: Diagnosis not present

## 2017-05-31 DIAGNOSIS — R1311 Dysphagia, oral phase: Secondary | ICD-10-CM | POA: Diagnosis not present

## 2017-05-31 DIAGNOSIS — R296 Repeated falls: Secondary | ICD-10-CM | POA: Diagnosis not present

## 2017-05-31 DIAGNOSIS — I6523 Occlusion and stenosis of bilateral carotid arteries: Secondary | ICD-10-CM | POA: Diagnosis not present

## 2017-05-31 DIAGNOSIS — R278 Other lack of coordination: Secondary | ICD-10-CM | POA: Diagnosis not present

## 2017-05-31 DIAGNOSIS — R293 Abnormal posture: Secondary | ICD-10-CM | POA: Diagnosis not present

## 2017-05-31 MED ORDER — LORATADINE 10 MG PO TABS: 10.0000 mg | ORAL_TABLET | Freq: Two times a day (BID) | ORAL | 5 refills | Status: DC

## 2017-05-31 MED ORDER — TACROLIMUS 0.1 % EX OINT: TOPICAL_OINTMENT | CUTANEOUS | 3 refills | Status: DC

## 2017-05-31 MED ORDER — CETIRIZINE HCL 10 MG PO TABS: 10.0000 mg | ORAL_TABLET | Freq: Two times a day (BID) | ORAL | 5 refills | Status: AC

## 2017-05-31 NOTE — Progress Notes (Signed)
Follow-up Note  Referring Provider: No ref. provider found Primary Provider: System, Pcp Not In Date of Office Visit: 05/31/2017  Subjective:   Joanna Hill (DOB: 1931/12/25) is a 83 y.o. female who returns to the [facility] [place] on 05/31/2017 in re-evaluation of the following:  HPI: Joanna Hill returns to this clinic in reevaluation of her pruritic disorder, rhinitis, and nocturnal oxygen use. I last saw her in this clinic 03/08/2017.  Although her skin has improved regarding all the sores that were present she still continues to have intermittent itchiness of her skin. She does have a few sores that are still left on the back of her neck.she would like to be tested for allergic disease giving rise to this pruritus.  When I last saw her in his clinic she also had some postnasal drip and throat clearing and a glob stuck in her throat and some intermittent nasal congestion but this has improved somewhat.  She is no longer using oxygen at this point in time.  Allergies as of 05/31/2017   No Known Allergies     Medication List      acetaminophen 325 MG tablet Commonly known as:  TYLENOL Take 650 mg by mouth every 6 (six) hours as needed for mild pain, moderate pain, fever or headache.   antiseptic oral rinse Liqd 5 mLs by Mouth Rinse route 2 (two) times daily.   ARTIFICIAL TEAR OP Place 1 drop into both eyes 4 (four) times daily.   Carboxymethylcellulose Sodium 0.25 % Soln Apply 1 drop to eye 4 (four) times daily.   CARDIZEM CD 240 MG 24 hr capsule Generic drug:  diltiazem Take 1 capsule (240 mg total) by mouth daily.   cetirizine 10 MG tablet Commonly known as:  ZYRTEC Take 10 mg by mouth daily.   Colchicine 0.6 MG Caps   cyproheptadine 4 MG tablet Commonly known as:  PERIACTIN Take one tablet twice daily   diphenhydrAMINE 25 mg capsule Commonly known as:  BENADRYL Take 25 mg by mouth every 6 (six) hours as needed.   docusate sodium 100 MG  capsule Commonly known as:  COLACE Take 100 mg by mouth 2 (two) times daily.   donepezil 10 MG tablet Commonly known as:  ARICEPT Take 10 mg by mouth at bedtime.   fluticasone 50 MCG/ACT nasal spray Commonly known as:  FLONASE Place 1 spray into both nostrils 2 (two) times daily.   furosemide 40 MG tablet Commonly known as:  LASIX Take 20 mg by mouth daily.   gabapentin 300 MG capsule Commonly known as:  NEURONTIN Take 300 mg by mouth 2 (two) times daily.   insulin aspart 100 UNIT/ML injection Commonly known as:  novoLOG Inject into the skin daily. BL. MOD. ACCUCHECK WITH S/S USE NOVOLOG INSULIN  101-150 = 2U 151-200= 3U 201-250 = 5U 251-300 = 7U 310-350 = 9U >350 = 11U CALL MD FOR BS.400 OR < 60 Taken at 6am 11:30 am 14:30 and 21:00   ketotifen 0.025 % ophthalmic solution Commonly known as:  ZADITOR Place 1 drop into both eyes 2 (two) times daily.   LEVEMIR FLEXTOUCH 100 UNIT/ML Pen Generic drug:  Insulin Detemir Inject 24 Units into the skin daily at 10 pm.   levothyroxine 100 MCG tablet Commonly known as:  SYNTHROID, LEVOTHROID Take 1 tablet (100 mcg total) by mouth daily before breakfast.   LUVOX CR 150 MG Cp24 Generic drug:  Fluvoxamine Maleate Take 150 mg by mouth at bedtime.  metoprolol succinate 25 MG 24 hr tablet Commonly known as:  TOPROL-XL   mirtazapine 15 MG tablet Commonly known as:  REMERON Take 15 mg by mouth at bedtime.   MUCINEX D 60-600 MG 12 hr tablet Generic drug:  pseudoephedrine-guaifenesin Take 1 tablet by mouth every 12 (twelve) hours.   neomycin-polymyxin-dexameth 0.1 % Oint Commonly known as:  MAXITROL Place 1 application into both eyes at bedtime.   OCUSOFT LID SCRUB Pads Apply 1 each topically 2 (two) times daily.   OVER THE COUNTER MEDICATION Place 1 application into both eyes at bedtime. Vaseline white petroleum Jelly   pantoprazole 40 MG tablet Commonly known as:  PROTONIX Take 40 mg by mouth daily.   polyethylene  glycol packet Commonly known as:  MIRALAX / GLYCOLAX Take 17 g by mouth daily as needed.   predniSONE 10 MG (21) Tbpk tablet Commonly known as:  STERAPRED UNI-PAK 21 TAB Take 10 mg by mouth once.   ranitidine 150 MG tablet Commonly known as:  ZANTAC Take 150 mg by mouth 2 (two) times daily.   Rivaroxaban 15 MG Tabs tablet Commonly known as:  XARELTO Take 15 mg by mouth daily with supper.   sucralfate 1 g tablet Commonly known as:  CARAFATE Take 1 g by mouth 4 (four) times daily.   SYSTANE 0.4-0.3 % Gel ophthalmic gel Generic drug:  Polyethyl Glycol-Propyl Glycol Place 1 application into both eyes at bedtime.   SYSTANE OVERNIGHT THERAPY 0.3 % Gel ophthalmic ointment Generic drug:  hypromellose Place 1 application into both eyes at bedtime.   triamcinolone cream 0.5 % Commonly known as:  KENALOG   UNABLE TO FIND Place 1 application into both eyes at bedtime. Vaseline Petroleum jelly        Past Medical History:  Diagnosis Date  . Atrial fibrillation with RVR ([place]) 02/28/2017  . Blindness   . Cancer ([place])    breast  . Carotid artery occlusion   . Dementia   . Diabetes mellitus   . GERD (gastroesophageal reflux disease)   . Hyperlipidemia   . Hypertension   . Hypothyroidism   . Knee injury 2018  . Stroke [facility]) 2009 and  2011   difficulty with swallowing    Past Surgical History:  Procedure Laterality Date  . ABDOMINAL HYSTERECTOMY    . Arch Aortogram  03-15-2010  . MASTECTOMY, PARTIAL Right   . ROTATOR CUFF REPAIR Left 1981    Review of systems negative except as noted in HPI / PMHx or noted below:  Review of Systems  Constitutional: Negative.   HENT: Negative.   Eyes: Negative.   Respiratory: Negative.   Cardiovascular: Negative.   Gastrointestinal: Negative.   Genitourinary: Negative.   Musculoskeletal: Negative.   Skin: Negative.   Neurological: Negative.   Endo/Heme/Allergies: Negative.   Psychiatric/Behavioral: Negative.       Objective:   Vitals:   05/31/17 0949  BP: 118/68  Pulse: 88  Resp: 16  SpO2: 97%          Physical Exam  Constitutional: She is well-developed, well-nourished, and in no distress.  Wheelchair  HENT:  Head: Normocephalic.  Right Ear: Tympanic membrane, external ear and ear canal normal.  Left Ear: Tympanic membrane, external ear and ear canal normal.  Nose: Nose normal. No mucosal edema or rhinorrhea.  Mouth/Throat: Uvula is midline, oropharynx is clear and moist and mucous membranes are normal. No oropharyngeal exudate.  Eyes: Conjunctivae are normal.  Neck: Trachea normal. No tracheal tenderness present. No tracheal  deviation present. No thyromegaly present.  Cardiovascular: Normal rate, regular rhythm, S1 normal, S2 normal and normal heart sounds.   No murmur heard. Pulmonary/Chest: Breath sounds normal. No stridor. No respiratory distress. She has no wheezes. She has no rales.  Lymphadenopathy:       Head (right side): No tonsillar adenopathy present.       Head (left side): No tonsillar adenopathy present.    She has no cervical adenopathy.  Neurological: She is alert.  Skin: No rash (2 - 0.5 mm diameter excoriated papules involving posterior neck) noted. She is not diaphoretic. No erythema. Nails show no clubbing.  Psychiatric: Mood and affect normal.    Diagnostics: None  Assessment and Plan:   1. Pruritic disorder   2. Other allergic rhinitis     1. Use a combination of the following medications every day:   A. Cetirizine 10mg  one tablet two times per day  B. Ranitidine 150 one tablet two times per day   C. loratadine 10 mg one tablet 2 times per day  D. Flonase - one spray each nostril two times per day  2. Continue moisturization of skin after bath / shower  3. Can use Protopic 0.1% ointment to irritated skin areas one time per day until resolved  4. Blood - Area 2 aeroallergen profile, CBC w/Diff, celiac screen.  5. Return to clinic in 12  weeks or earlier if problem   I'm going to have Margi utilize tacrolimus to any area that is irritated on her skin one time per day until resolved. As well, I'm going to investigate her for possible aero allergen hypersensitivity and look for the presence of eosinophilia and also investigate for the slim possibility of dermatitis herpetiformis. I will see her in his clinic in 12 weeks or earlier if there is a problem.  Allena Katz, MD Allergy / Immunology [place]

## 2017-05-31 NOTE — Patient Instructions (Addendum)
  1. Use a combination of the following medications every day:   A. Cetirizine 10mg  one tablet two times per day  B. Ranitidine 150 one tablet two times per day   C. loratadine 10 mg one tablet 2 times per day  D. Flonase - one spray each nostril two times per day  2. Continue moisturization of skin after bath / shower  3. Can use Protopic 0.1% ointment to irritated skin areas one time per day until resolved  4. Blood - Area 2 aeroallergen profile, CBC w/Diff, celiac screen.  5. Return to clinic in 12 weeks or earlier if problem

## 2017-06-01 DIAGNOSIS — Z5181 Encounter for therapeutic drug level monitoring: Secondary | ICD-10-CM | POA: Diagnosis not present

## 2017-06-01 DIAGNOSIS — R293 Abnormal posture: Secondary | ICD-10-CM | POA: Diagnosis not present

## 2017-06-01 DIAGNOSIS — D649 Anemia, unspecified: Secondary | ICD-10-CM | POA: Diagnosis not present

## 2017-06-01 DIAGNOSIS — J1089 Influenza due to other identified influenza virus with other manifestations: Secondary | ICD-10-CM | POA: Diagnosis not present

## 2017-06-01 DIAGNOSIS — R296 Repeated falls: Secondary | ICD-10-CM | POA: Diagnosis not present

## 2017-06-01 DIAGNOSIS — R278 Other lack of coordination: Secondary | ICD-10-CM | POA: Diagnosis not present

## 2017-06-01 DIAGNOSIS — R1311 Dysphagia, oral phase: Secondary | ICD-10-CM | POA: Diagnosis not present

## 2017-06-02 ENCOUNTER — Encounter: Payer: Self-pay | Admitting: Cardiology

## 2017-06-02 DIAGNOSIS — R278 Other lack of coordination: Secondary | ICD-10-CM | POA: Diagnosis not present

## 2017-06-02 DIAGNOSIS — I4891 Unspecified atrial fibrillation: Secondary | ICD-10-CM | POA: Diagnosis not present

## 2017-06-02 DIAGNOSIS — L89892 Pressure ulcer of other site, stage 2: Secondary | ICD-10-CM | POA: Diagnosis not present

## 2017-06-02 DIAGNOSIS — R600 Localized edema: Secondary | ICD-10-CM | POA: Diagnosis not present

## 2017-06-02 DIAGNOSIS — R296 Repeated falls: Secondary | ICD-10-CM | POA: Diagnosis not present

## 2017-06-02 DIAGNOSIS — R293 Abnormal posture: Secondary | ICD-10-CM | POA: Diagnosis not present

## 2017-06-02 DIAGNOSIS — J1089 Influenza due to other identified influenza virus with other manifestations: Secondary | ICD-10-CM | POA: Diagnosis not present

## 2017-06-02 DIAGNOSIS — R1311 Dysphagia, oral phase: Secondary | ICD-10-CM | POA: Diagnosis not present

## 2017-06-02 DIAGNOSIS — I509 Heart failure, unspecified: Secondary | ICD-10-CM | POA: Diagnosis not present

## 2017-06-02 DIAGNOSIS — Z5181 Encounter for therapeutic drug level monitoring: Secondary | ICD-10-CM | POA: Diagnosis not present

## 2017-06-05 DIAGNOSIS — J1089 Influenza due to other identified influenza virus with other manifestations: Secondary | ICD-10-CM | POA: Diagnosis not present

## 2017-06-05 DIAGNOSIS — R293 Abnormal posture: Secondary | ICD-10-CM | POA: Diagnosis not present

## 2017-06-05 DIAGNOSIS — R296 Repeated falls: Secondary | ICD-10-CM | POA: Diagnosis not present

## 2017-06-05 DIAGNOSIS — R1311 Dysphagia, oral phase: Secondary | ICD-10-CM | POA: Diagnosis not present

## 2017-06-05 DIAGNOSIS — Z5181 Encounter for therapeutic drug level monitoring: Secondary | ICD-10-CM | POA: Diagnosis not present

## 2017-06-05 DIAGNOSIS — R278 Other lack of coordination: Secondary | ICD-10-CM | POA: Diagnosis not present

## 2017-06-06 DIAGNOSIS — R1311 Dysphagia, oral phase: Secondary | ICD-10-CM | POA: Diagnosis not present

## 2017-06-06 DIAGNOSIS — R278 Other lack of coordination: Secondary | ICD-10-CM | POA: Diagnosis not present

## 2017-06-06 DIAGNOSIS — R293 Abnormal posture: Secondary | ICD-10-CM | POA: Diagnosis not present

## 2017-06-06 DIAGNOSIS — Z5181 Encounter for therapeutic drug level monitoring: Secondary | ICD-10-CM | POA: Diagnosis not present

## 2017-06-06 DIAGNOSIS — J1089 Influenza due to other identified influenza virus with other manifestations: Secondary | ICD-10-CM | POA: Diagnosis not present

## 2017-06-06 DIAGNOSIS — R296 Repeated falls: Secondary | ICD-10-CM | POA: Diagnosis not present

## 2017-06-07 DIAGNOSIS — Z5181 Encounter for therapeutic drug level monitoring: Secondary | ICD-10-CM | POA: Diagnosis not present

## 2017-06-07 DIAGNOSIS — F0280 Dementia in other diseases classified elsewhere without behavioral disturbance: Secondary | ICD-10-CM | POA: Diagnosis not present

## 2017-06-07 DIAGNOSIS — R1311 Dysphagia, oral phase: Secondary | ICD-10-CM | POA: Diagnosis not present

## 2017-06-07 DIAGNOSIS — F330 Major depressive disorder, recurrent, mild: Secondary | ICD-10-CM | POA: Diagnosis not present

## 2017-06-07 DIAGNOSIS — R278 Other lack of coordination: Secondary | ICD-10-CM | POA: Diagnosis not present

## 2017-06-07 DIAGNOSIS — F419 Anxiety disorder, unspecified: Secondary | ICD-10-CM | POA: Diagnosis not present

## 2017-06-07 DIAGNOSIS — F429 Obsessive-compulsive disorder, unspecified: Secondary | ICD-10-CM | POA: Diagnosis not present

## 2017-06-07 DIAGNOSIS — S81802D Unspecified open wound, left lower leg, subsequent encounter: Secondary | ICD-10-CM | POA: Diagnosis not present

## 2017-06-07 DIAGNOSIS — R296 Repeated falls: Secondary | ICD-10-CM | POA: Diagnosis not present

## 2017-06-07 DIAGNOSIS — R293 Abnormal posture: Secondary | ICD-10-CM | POA: Diagnosis not present

## 2017-06-07 DIAGNOSIS — G301 Alzheimer's disease with late onset: Secondary | ICD-10-CM | POA: Diagnosis not present

## 2017-06-07 DIAGNOSIS — S81802A Unspecified open wound, left lower leg, initial encounter: Secondary | ICD-10-CM | POA: Diagnosis not present

## 2017-06-07 DIAGNOSIS — G4700 Insomnia, unspecified: Secondary | ICD-10-CM | POA: Diagnosis not present

## 2017-06-07 DIAGNOSIS — J1089 Influenza due to other identified influenza virus with other manifestations: Secondary | ICD-10-CM | POA: Diagnosis not present

## 2017-06-08 ENCOUNTER — Telehealth: Payer: Self-pay | Admitting: Cardiology

## 2017-06-08 DIAGNOSIS — J1089 Influenza due to other identified influenza virus with other manifestations: Secondary | ICD-10-CM | POA: Diagnosis not present

## 2017-06-08 DIAGNOSIS — I509 Heart failure, unspecified: Secondary | ICD-10-CM | POA: Diagnosis not present

## 2017-06-08 DIAGNOSIS — L89892 Pressure ulcer of other site, stage 2: Secondary | ICD-10-CM | POA: Diagnosis not present

## 2017-06-08 DIAGNOSIS — R600 Localized edema: Secondary | ICD-10-CM | POA: Diagnosis not present

## 2017-06-08 DIAGNOSIS — Z5181 Encounter for therapeutic drug level monitoring: Secondary | ICD-10-CM | POA: Diagnosis not present

## 2017-06-08 DIAGNOSIS — R293 Abnormal posture: Secondary | ICD-10-CM | POA: Diagnosis not present

## 2017-06-08 DIAGNOSIS — R278 Other lack of coordination: Secondary | ICD-10-CM | POA: Diagnosis not present

## 2017-06-08 DIAGNOSIS — R296 Repeated falls: Secondary | ICD-10-CM | POA: Diagnosis not present

## 2017-06-08 DIAGNOSIS — R1311 Dysphagia, oral phase: Secondary | ICD-10-CM | POA: Diagnosis not present

## 2017-06-08 DIAGNOSIS — E876 Hypokalemia: Secondary | ICD-10-CM | POA: Diagnosis not present

## 2017-06-08 DIAGNOSIS — I4891 Unspecified atrial fibrillation: Secondary | ICD-10-CM | POA: Diagnosis not present

## 2017-06-08 NOTE — Telephone Encounter (Signed)
Closed Encounter  °

## 2017-06-09 DIAGNOSIS — L603 Nail dystrophy: Secondary | ICD-10-CM | POA: Diagnosis not present

## 2017-06-09 DIAGNOSIS — E1140 Type 2 diabetes mellitus with diabetic neuropathy, unspecified: Secondary | ICD-10-CM | POA: Diagnosis not present

## 2017-06-09 DIAGNOSIS — Z794 Long term (current) use of insulin: Secondary | ICD-10-CM | POA: Diagnosis not present

## 2017-06-10 DIAGNOSIS — R1311 Dysphagia, oral phase: Secondary | ICD-10-CM | POA: Diagnosis not present

## 2017-06-10 DIAGNOSIS — R296 Repeated falls: Secondary | ICD-10-CM | POA: Diagnosis not present

## 2017-06-10 DIAGNOSIS — R278 Other lack of coordination: Secondary | ICD-10-CM | POA: Diagnosis not present

## 2017-06-10 DIAGNOSIS — J1089 Influenza due to other identified influenza virus with other manifestations: Secondary | ICD-10-CM | POA: Diagnosis not present

## 2017-06-10 DIAGNOSIS — R293 Abnormal posture: Secondary | ICD-10-CM | POA: Diagnosis not present

## 2017-06-10 DIAGNOSIS — Z5181 Encounter for therapeutic drug level monitoring: Secondary | ICD-10-CM | POA: Diagnosis not present

## 2017-06-12 DIAGNOSIS — R278 Other lack of coordination: Secondary | ICD-10-CM | POA: Diagnosis not present

## 2017-06-12 DIAGNOSIS — R296 Repeated falls: Secondary | ICD-10-CM | POA: Diagnosis not present

## 2017-06-12 DIAGNOSIS — R293 Abnormal posture: Secondary | ICD-10-CM | POA: Diagnosis not present

## 2017-06-12 DIAGNOSIS — J1089 Influenza due to other identified influenza virus with other manifestations: Secondary | ICD-10-CM | POA: Diagnosis not present

## 2017-06-12 DIAGNOSIS — F419 Anxiety disorder, unspecified: Secondary | ICD-10-CM | POA: Diagnosis not present

## 2017-06-12 DIAGNOSIS — Z5181 Encounter for therapeutic drug level monitoring: Secondary | ICD-10-CM | POA: Diagnosis not present

## 2017-06-12 DIAGNOSIS — F429 Obsessive-compulsive disorder, unspecified: Secondary | ICD-10-CM | POA: Diagnosis not present

## 2017-06-12 DIAGNOSIS — R1311 Dysphagia, oral phase: Secondary | ICD-10-CM | POA: Diagnosis not present

## 2017-06-13 DIAGNOSIS — R1311 Dysphagia, oral phase: Secondary | ICD-10-CM | POA: Diagnosis not present

## 2017-06-13 DIAGNOSIS — R296 Repeated falls: Secondary | ICD-10-CM | POA: Diagnosis not present

## 2017-06-13 DIAGNOSIS — R293 Abnormal posture: Secondary | ICD-10-CM | POA: Diagnosis not present

## 2017-06-13 DIAGNOSIS — R278 Other lack of coordination: Secondary | ICD-10-CM | POA: Diagnosis not present

## 2017-06-13 DIAGNOSIS — Z5181 Encounter for therapeutic drug level monitoring: Secondary | ICD-10-CM | POA: Diagnosis not present

## 2017-06-13 DIAGNOSIS — J1089 Influenza due to other identified influenza virus with other manifestations: Secondary | ICD-10-CM | POA: Diagnosis not present

## 2017-06-14 DIAGNOSIS — S81802D Unspecified open wound, left lower leg, subsequent encounter: Secondary | ICD-10-CM | POA: Diagnosis not present

## 2017-06-14 DIAGNOSIS — R296 Repeated falls: Secondary | ICD-10-CM | POA: Diagnosis not present

## 2017-06-14 DIAGNOSIS — J1089 Influenza due to other identified influenza virus with other manifestations: Secondary | ICD-10-CM | POA: Diagnosis not present

## 2017-06-14 DIAGNOSIS — F419 Anxiety disorder, unspecified: Secondary | ICD-10-CM | POA: Diagnosis not present

## 2017-06-14 DIAGNOSIS — Z5181 Encounter for therapeutic drug level monitoring: Secondary | ICD-10-CM | POA: Diagnosis not present

## 2017-06-14 DIAGNOSIS — S81802A Unspecified open wound, left lower leg, initial encounter: Secondary | ICD-10-CM | POA: Diagnosis not present

## 2017-06-14 DIAGNOSIS — R293 Abnormal posture: Secondary | ICD-10-CM | POA: Diagnosis not present

## 2017-06-14 DIAGNOSIS — R1311 Dysphagia, oral phase: Secondary | ICD-10-CM | POA: Diagnosis not present

## 2017-06-14 DIAGNOSIS — R278 Other lack of coordination: Secondary | ICD-10-CM | POA: Diagnosis not present

## 2017-06-15 DIAGNOSIS — G8929 Other chronic pain: Secondary | ICD-10-CM | POA: Diagnosis not present

## 2017-06-15 DIAGNOSIS — R1312 Dysphagia, oropharyngeal phase: Secondary | ICD-10-CM | POA: Diagnosis not present

## 2017-06-15 DIAGNOSIS — R278 Other lack of coordination: Secondary | ICD-10-CM | POA: Diagnosis not present

## 2017-06-15 DIAGNOSIS — I509 Heart failure, unspecified: Secondary | ICD-10-CM | POA: Diagnosis not present

## 2017-06-15 DIAGNOSIS — J1089 Influenza due to other identified influenza virus with other manifestations: Secondary | ICD-10-CM | POA: Diagnosis not present

## 2017-06-15 DIAGNOSIS — I2510 Atherosclerotic heart disease of native coronary artery without angina pectoris: Secondary | ICD-10-CM | POA: Diagnosis not present

## 2017-06-15 DIAGNOSIS — M6281 Muscle weakness (generalized): Secondary | ICD-10-CM | POA: Diagnosis not present

## 2017-06-15 DIAGNOSIS — R1311 Dysphagia, oral phase: Secondary | ICD-10-CM | POA: Diagnosis not present

## 2017-06-15 DIAGNOSIS — I10 Essential (primary) hypertension: Secondary | ICD-10-CM | POA: Diagnosis not present

## 2017-06-15 DIAGNOSIS — M179 Osteoarthritis of knee, unspecified: Secondary | ICD-10-CM | POA: Diagnosis not present

## 2017-06-15 DIAGNOSIS — R296 Repeated falls: Secondary | ICD-10-CM | POA: Diagnosis not present

## 2017-06-15 DIAGNOSIS — R293 Abnormal posture: Secondary | ICD-10-CM | POA: Diagnosis not present

## 2017-06-15 DIAGNOSIS — R221 Localized swelling, mass and lump, neck: Secondary | ICD-10-CM | POA: Diagnosis not present

## 2017-06-15 DIAGNOSIS — Z5181 Encounter for therapeutic drug level monitoring: Secondary | ICD-10-CM | POA: Diagnosis not present

## 2017-06-15 DIAGNOSIS — J45909 Unspecified asthma, uncomplicated: Secondary | ICD-10-CM | POA: Diagnosis not present

## 2017-06-15 DIAGNOSIS — I4891 Unspecified atrial fibrillation: Secondary | ICD-10-CM | POA: Diagnosis not present

## 2017-06-17 DIAGNOSIS — R296 Repeated falls: Secondary | ICD-10-CM | POA: Diagnosis not present

## 2017-06-17 DIAGNOSIS — R293 Abnormal posture: Secondary | ICD-10-CM | POA: Diagnosis not present

## 2017-06-17 DIAGNOSIS — R278 Other lack of coordination: Secondary | ICD-10-CM | POA: Diagnosis not present

## 2017-06-17 DIAGNOSIS — J1089 Influenza due to other identified influenza virus with other manifestations: Secondary | ICD-10-CM | POA: Diagnosis not present

## 2017-06-17 DIAGNOSIS — R1311 Dysphagia, oral phase: Secondary | ICD-10-CM | POA: Diagnosis not present

## 2017-06-17 DIAGNOSIS — Z5181 Encounter for therapeutic drug level monitoring: Secondary | ICD-10-CM | POA: Diagnosis not present

## 2017-06-19 DIAGNOSIS — R293 Abnormal posture: Secondary | ICD-10-CM | POA: Diagnosis not present

## 2017-06-19 DIAGNOSIS — Z5181 Encounter for therapeutic drug level monitoring: Secondary | ICD-10-CM | POA: Diagnosis not present

## 2017-06-19 DIAGNOSIS — R296 Repeated falls: Secondary | ICD-10-CM | POA: Diagnosis not present

## 2017-06-19 DIAGNOSIS — I4891 Unspecified atrial fibrillation: Secondary | ICD-10-CM | POA: Diagnosis not present

## 2017-06-19 DIAGNOSIS — J1089 Influenza due to other identified influenza virus with other manifestations: Secondary | ICD-10-CM | POA: Diagnosis not present

## 2017-06-19 DIAGNOSIS — R1311 Dysphagia, oral phase: Secondary | ICD-10-CM | POA: Diagnosis not present

## 2017-06-19 DIAGNOSIS — R278 Other lack of coordination: Secondary | ICD-10-CM | POA: Diagnosis not present

## 2017-06-19 DIAGNOSIS — E119 Type 2 diabetes mellitus without complications: Secondary | ICD-10-CM | POA: Diagnosis not present

## 2017-06-19 DIAGNOSIS — I509 Heart failure, unspecified: Secondary | ICD-10-CM | POA: Diagnosis not present

## 2017-06-19 DIAGNOSIS — L89892 Pressure ulcer of other site, stage 2: Secondary | ICD-10-CM | POA: Diagnosis not present

## 2017-06-20 ENCOUNTER — Ambulatory Visit: Payer: Medicare Other | Admitting: Cardiology

## 2017-06-20 DIAGNOSIS — Z5181 Encounter for therapeutic drug level monitoring: Secondary | ICD-10-CM | POA: Diagnosis not present

## 2017-06-20 DIAGNOSIS — R1311 Dysphagia, oral phase: Secondary | ICD-10-CM | POA: Diagnosis not present

## 2017-06-20 DIAGNOSIS — R293 Abnormal posture: Secondary | ICD-10-CM | POA: Diagnosis not present

## 2017-06-20 DIAGNOSIS — R296 Repeated falls: Secondary | ICD-10-CM | POA: Diagnosis not present

## 2017-06-20 DIAGNOSIS — R278 Other lack of coordination: Secondary | ICD-10-CM | POA: Diagnosis not present

## 2017-06-20 DIAGNOSIS — J1089 Influenza due to other identified influenza virus with other manifestations: Secondary | ICD-10-CM | POA: Diagnosis not present

## 2017-06-21 DIAGNOSIS — S81802D Unspecified open wound, left lower leg, subsequent encounter: Secondary | ICD-10-CM | POA: Diagnosis not present

## 2017-06-21 DIAGNOSIS — R1311 Dysphagia, oral phase: Secondary | ICD-10-CM | POA: Diagnosis not present

## 2017-06-21 DIAGNOSIS — R296 Repeated falls: Secondary | ICD-10-CM | POA: Diagnosis not present

## 2017-06-21 DIAGNOSIS — Z5181 Encounter for therapeutic drug level monitoring: Secondary | ICD-10-CM | POA: Diagnosis not present

## 2017-06-21 DIAGNOSIS — J1089 Influenza due to other identified influenza virus with other manifestations: Secondary | ICD-10-CM | POA: Diagnosis not present

## 2017-06-21 DIAGNOSIS — R278 Other lack of coordination: Secondary | ICD-10-CM | POA: Diagnosis not present

## 2017-06-21 DIAGNOSIS — R293 Abnormal posture: Secondary | ICD-10-CM | POA: Diagnosis not present

## 2017-06-21 DIAGNOSIS — F419 Anxiety disorder, unspecified: Secondary | ICD-10-CM | POA: Diagnosis not present

## 2017-06-22 DIAGNOSIS — R1311 Dysphagia, oral phase: Secondary | ICD-10-CM | POA: Diagnosis not present

## 2017-06-22 DIAGNOSIS — R296 Repeated falls: Secondary | ICD-10-CM | POA: Diagnosis not present

## 2017-06-22 DIAGNOSIS — J1089 Influenza due to other identified influenza virus with other manifestations: Secondary | ICD-10-CM | POA: Diagnosis not present

## 2017-06-22 DIAGNOSIS — R278 Other lack of coordination: Secondary | ICD-10-CM | POA: Diagnosis not present

## 2017-06-22 DIAGNOSIS — Z5181 Encounter for therapeutic drug level monitoring: Secondary | ICD-10-CM | POA: Diagnosis not present

## 2017-06-22 DIAGNOSIS — R293 Abnormal posture: Secondary | ICD-10-CM | POA: Diagnosis not present

## 2017-06-22 NOTE — Progress Notes (Signed)
Cardiology Office Note   Date:  06/23/2017   ID:  Joanna Hill, DOB May 07, 1932, MRN [id number]  PCP:    Seward Carol, MD Cardiologist:   Minus Breeding, MD   No chief complaint on file.     History of Present Illness: Joanna Hill is a 83 y.o. female who presents for follow up of atrial fibrillation (Xarelto).  She had this when she was admitted with abdominal pain.  She had an elevated troponin.   This was thought to be demand ischemia.  She presents for follow-up.  Her weights have been going up and she is up about 14 pounds.  She gets around completely in a wheelchair pulling herself with her feet.  She has not noticed any increased PND or orthopnea or overt shortness of breath but she does not know that her legs are more swollen and her belly is swollen.  She apparently has a skin tear on the left leg which is bandaged and she said she had some weeping from there.  He has a cough productive of some white sputum.  He does not notice palpitations and has had no presyncope or syncope.  She has had no chest pressure, neck or arm discomfort.  She has had no fevers or chills.  She does have a coarse voice.  She says her sputum is not overly salted.   Past Medical History:  Diagnosis Date  . Atrial fibrillation with RVR ([place]) 02/28/2017  . Blindness   . Cancer ([place])    breast  . Carotid artery occlusion   . Dementia   . Diabetes mellitus   . GERD (gastroesophageal reflux disease)   . Hyperlipidemia   . Hypertension   . Hypothyroidism   . Knee injury 2018  . Stroke [facility]) 2009 and  2011   difficulty with swallowing    Past Surgical History:  Procedure Laterality Date  . ABDOMINAL HYSTERECTOMY    . Arch Aortogram  03-15-2010  . MASTECTOMY, PARTIAL Right   . ROTATOR CUFF REPAIR Left 1981     Current Outpatient Medications  Medication Sig Dispense Refill  . acetaminophen (TYLENOL) 325 MG tablet Take 650 mg by mouth every 6 (six) hours as needed for mild pain, moderate  pain, fever or headache.     Marland Kitchen antiseptic oral rinse (BIOTENE) LIQD 5 mLs by Mouth Rinse route 2 (two) times daily.    . ARTIFICIAL TEAR OP Place 1 drop into both eyes 4 (four) times daily.    . Carboxymethylcellulose Sodium 0.25 % SOLN Apply 1 drop to eye 4 (four) times daily.    . cetirizine (ZYRTEC) 10 MG tablet Take 1 tablet (10 mg total) by mouth 2 (two) times daily. 60 tablet 5  . cyproheptadine (PERIACTIN) 4 MG tablet Take one tablet twice daily 60 tablet 5  . diclofenac sodium (VOLTAREN) 1 % GEL May use gel to right hip and thigh as needed for pain.    Marland Kitchen diltiazem (CARDIZEM CD) 240 MG 24 hr capsule Take 1 capsule (240 mg total) by mouth daily. 30 capsule 6  . diphenhydrAMINE (BENADRYL) 25 mg capsule Take 25 mg by mouth every 6 (six) hours as needed.    . docusate sodium (COLACE) 100 MG capsule Take 100 mg by mouth 2 (two) times daily.    Marland Kitchen donepezil (ARICEPT) 10 MG tablet Take 10 mg by mouth at bedtime.    . Eyelid Cleansers (OCUSOFT LID SCRUB) PADS Apply 1 each topically 2 (two) times daily.    Marland Kitchen  fluticasone (FLONASE) 50 MCG/ACT nasal spray Place 1 spray into both nostrils 2 (two) times daily.     . Fluvoxamine Maleate (LUVOX CR) 150 MG CP24 Take 150 mg by mouth at bedtime.    . furosemide (LASIX) 40 MG tablet Take 1 tablet (40 mg total) daily by mouth. 90 tablet 3  . gabapentin (NEURONTIN) 300 MG capsule Take 300 mg by mouth 2 (two) times daily.    . GuaiFENesin (MUCINEX PO) Take 600 mg by mouth 2 (two) times daily.    . hypromellose (SYSTANE OVERNIGHT THERAPY) 0.3 % GEL ophthalmic ointment Place 1 application into both eyes at bedtime.    . insulin aspart (NOVOLOG) 100 UNIT/ML injection Inject into the skin daily. BL. MOD. ACCUCHECK WITH S/S USE NOVOLOG INSULIN  101-150 = 2U 151-200= 3U 201-250 = 5U 251-300 = 7U 310-350 = 9U >350 = 11U CALL MD FOR BS.400 OR < 60 Taken at 6am 11:30 am 14:30 and 21:00    . ketotifen (ZADITOR) 0.025 % ophthalmic solution Place 1 drop into both  eyes 2 (two) times daily.     Marland Kitchen levalbuterol (XOPENEX) 0.63 MG/3ML nebulizer solution     . LEVEMIR FLEXTOUCH 100 UNIT/ML Pen Inject 24 Units into the skin daily at 10 pm. 15 mL 0  . levothyroxine (SYNTHROID, LEVOTHROID) 100 MCG tablet Take 1 tablet (100 mcg total) by mouth daily before breakfast. 30 tablet 0  . loratadine (CLARITIN) 10 MG tablet Take 1 tablet (10 mg total) by mouth 2 (two) times daily. 60 tablet 5  . magic mouthwash SOLN Take 5 mLs by mouth 2 (two) times daily.    . mirtazapine (REMERON) 15 MG tablet Take 15 mg by mouth at bedtime.    Marland Kitchen neomycin-polymyxin-dexameth (MAXITROL) 0.1 % OINT Place 1 application into both eyes at bedtime.    . nitroGLYCERIN (NITROSTAT) 0.4 MG SL tablet     . OVER THE COUNTER MEDICATION Place 1 application into both eyes at bedtime. Vaseline white petroleum Jelly    . pantoprazole (PROTONIX) 40 MG tablet Take 40 mg by mouth daily.     Vladimir Faster Glycol-Propyl Glycol (SYSTANE) 0.4-0.3 % GEL ophthalmic gel Place 1 application into both eyes at bedtime.    . polyethylene glycol (MIRALAX / GLYCOLAX) packet Take 17 g by mouth daily as needed. 14 each 0  . potassium chloride SA (K-DUR,KLOR-CON) 20 MEQ tablet     . ranitidine (ZANTAC) 150 MG tablet Take 150 mg by mouth 2 (two) times daily.    . Rivaroxaban (XARELTO) 15 MG TABS tablet Take 15 mg by mouth daily with supper.    . Saline 0.65 % (Soln) SOLN Place into the nose every 2 (two) hours as needed.    . sucralfate (CARAFATE) 1 g tablet Take 1 g by mouth 4 (four) times daily.     . tacrolimus (PROTOPIC) 0.1 % ointment Can apply to irritated skin once daily until resolved. 100 g 3  . traMADol (ULTRAM) 50 MG tablet Take by mouth every 8 (eight) hours as needed.    . traZODone (DESYREL) 50 MG tablet     . triamcinolone cream (KENALOG) 0.5 %      No current facility-administered medications for this visit.     Allergies:   Patient has no known allergies.    ROS:  Please see the history of present  illness.   Otherwise, review of systems are positive for none.   All other systems are reviewed and negative.    PHYSICAL  EXAM: VS:  BP (!) 150/70   Pulse 96   Ht 5\' 4"  (1.626 m)   Wt 164 lb 12.8 oz (74.8 kg)   SpO2 98%   BMI 28.29 kg/m  , BMI Body mass index is 28.29 kg/m.  GEN:  No distress NECK:  Positive jugular venous distention at 90 degrees, waveform within normal limits, carotid upstroke brisk and symmetric, no bruits, no thyromegaly LUNGS:  Clear to auscultation bilaterally BACK:  No CVA tenderness CHEST:  Right mastectomy HEART:  S1 and S2 within normal limits, no S3, no clicks, no rubs, no murmurs, irregular ABD:  Positive bowel sounds normal in frequency in pitch, no bruits, no rebound, no guarding, unable to assess midline mass or bruit with the patient seated. EXT:  2 plus pulses throughout, moderate left greater than right leg edema, no cyanosis no clubbing, right arm swelling.    EKG:  EKG is not ordered today.    Recent Labs: 02/28/2017: ALT 17; B Natriuretic Peptide 109.6; Magnesium 2.1 03/01/2017: Hemoglobin 9.2; Platelets 158; TSH 14.859 04/24/2017: BUN 16; Creatinine, Ser 0.94; Potassium 5.0; Sodium 138    Lipid Panel No results found for: CHOL, TRIG, HDL, CHOLHDL, VLDL, LDLCALC, LDLDIRECT    Wt Readings from Last 3 Encounters:  06/23/17 164 lb 12.8 oz (74.8 kg)  03/15/17 150 lb 3.2 oz (68.1 kg)  03/02/17 157 lb 6.5 oz (71.4 kg)      Other studies Reviewed: Additional studies/ records that were reviewed today include: We called the nursing home.. Review of the above records demonstrates:  Please see elsewhere in the note.     ASSESSMENT AND PLAN:  PERSISTENT ATRIAL FIB:  She tolerates this rhythm and atrial fib.  No change in therapy is indicated.   Carotid stenosis:  She had 40-59 percent of the right internal carotid and less than 40% stenosis of the left internal carotid and 2015.  She can have follow up scheduled when she returns.   Edema:    I am going to increase Lasix to 40 mg bid X 4 days and increase the potassium as well to 40 meq daily both changes for three days and then 40 mg Lasix daily and reduce back to 20 meq potassium daily and check a BMET next week and schedule to see one of our APPs.   Current medicines are reviewed at length with the patient today.  The patient does not have concerns regarding medicines.  The following changes have been made:  As above  Labs/ tests ordered today include:   Orders Placed This Encounter  Procedures  . Basic Metabolic Panel (BMET)     Disposition:   FU with APP next week.      Signed, Minus Breeding, MD  06/23/2017 3:31 PM    [facility]

## 2017-06-23 ENCOUNTER — Ambulatory Visit (INDEPENDENT_AMBULATORY_CARE_PROVIDER_SITE_OTHER): Payer: Medicare Other | Admitting: Cardiology

## 2017-06-23 ENCOUNTER — Encounter: Payer: Self-pay | Admitting: Cardiology

## 2017-06-23 VITALS — BP 150/70 | HR 96 | Ht 64.0 in | Wt 164.8 lb

## 2017-06-23 DIAGNOSIS — I6523 Occlusion and stenosis of bilateral carotid arteries: Secondary | ICD-10-CM | POA: Diagnosis not present

## 2017-06-23 DIAGNOSIS — I481 Persistent atrial fibrillation: Secondary | ICD-10-CM

## 2017-06-23 DIAGNOSIS — M7989 Other specified soft tissue disorders: Secondary | ICD-10-CM | POA: Diagnosis not present

## 2017-06-23 DIAGNOSIS — Z79899 Other long term (current) drug therapy: Secondary | ICD-10-CM | POA: Diagnosis not present

## 2017-06-23 DIAGNOSIS — I4819 Other persistent atrial fibrillation: Secondary | ICD-10-CM

## 2017-06-23 MED ORDER — FUROSEMIDE 40 MG PO TABS: 40.0000 mg | ORAL_TABLET | Freq: Every day | ORAL | 3 refills | Status: AC

## 2017-06-23 NOTE — Patient Instructions (Signed)
Medication Instructions:  INCREASE- Furosemide 40 mg daily, Take 40 mg twice a day for 3 days then back to 40 mg daily Take 40 meq of potassium for 3 days then back to 20 meq daily.  If you need a refill on your cardiac medications before your next appointment, please call your pharmacy.  Labwork: BMP Today HERE IN OUR OFFICE AT [facility]  Testing/Procedures: None Ordered  Follow-Up: Your physician wants you to follow-up in: 1 Week with APP.    Thank you for choosing [facility] at [facility]!!

## 2017-06-24 LAB — BASIC METABOLIC PANEL
BUN/Creatinine Ratio: 14 (ref 12–28)
BUN: 14 mg/dL (ref 8–27)
CALCIUM: 9 mg/dL (ref 8.7–10.3)
CO2: 27 mmol/L (ref 20–29)
CREATININE: 0.99 mg/dL (ref 0.57–1.00)
Chloride: 102 mmol/L (ref 96–106)
GFR calc Af Amer: 61 mL/min/{1.73_m2} (ref >59)
GFR, EST NON AFRICAN AMERICAN: 53 mL/min/{1.73_m2} — AB (ref >59)
Glucose: 97 mg/dL (ref 65–99)
POTASSIUM: 4.2 mmol/L (ref 3.5–5.2)
Sodium: 144 mmol/L (ref 134–144)

## 2017-06-28 DIAGNOSIS — S81802D Unspecified open wound, left lower leg, subsequent encounter: Secondary | ICD-10-CM | POA: Diagnosis not present

## 2017-06-28 DIAGNOSIS — F419 Anxiety disorder, unspecified: Secondary | ICD-10-CM | POA: Diagnosis not present

## 2017-06-30 ENCOUNTER — Ambulatory Visit (INDEPENDENT_AMBULATORY_CARE_PROVIDER_SITE_OTHER): Payer: Medicare Other | Admitting: Cardiology

## 2017-06-30 ENCOUNTER — Encounter: Payer: Self-pay | Admitting: Cardiology

## 2017-06-30 DIAGNOSIS — I6523 Occlusion and stenosis of bilateral carotid arteries: Secondary | ICD-10-CM

## 2017-06-30 DIAGNOSIS — I4821 Permanent atrial fibrillation: Secondary | ICD-10-CM

## 2017-06-30 DIAGNOSIS — I482 Chronic atrial fibrillation: Secondary | ICD-10-CM | POA: Diagnosis not present

## 2017-06-30 DIAGNOSIS — I5033 Acute on chronic diastolic (congestive) heart failure: Secondary | ICD-10-CM

## 2017-06-30 DIAGNOSIS — I10 Essential (primary) hypertension: Secondary | ICD-10-CM

## 2017-06-30 DIAGNOSIS — Z8673 Personal history of transient ischemic attack (TIA), and cerebral infarction without residual deficits: Secondary | ICD-10-CM

## 2017-06-30 DIAGNOSIS — Z7901 Long term (current) use of anticoagulants: Secondary | ICD-10-CM | POA: Diagnosis not present

## 2017-06-30 HISTORY — DX: Permanent atrial fibrillation: I48.21

## 2017-06-30 NOTE — Assessment & Plan Note (Signed)
Rate controlled 

## 2017-06-30 NOTE — Assessment & Plan Note (Signed)
Controlled.  

## 2017-06-30 NOTE — Assessment & Plan Note (Signed)
Lt brain stroke 2010 with residualdifficulty with swallowing and speech. Wheelchair bound

## 2017-06-30 NOTE — Assessment & Plan Note (Addendum)
Seen by Dr Percival Spanish 06/23/17- Lasix and K+ increased x 4 days, pt here for follow up. She appears to be compensated

## 2017-06-30 NOTE — Assessment & Plan Note (Signed)
Moderate carotid disease by doppler, remote Lt brain stroke. She has seen Dr Scot Dock in the past. Due for follow up dopplers

## 2017-06-30 NOTE — Patient Instructions (Addendum)
Medication Instructions:  1. Your physician recommends that you continue on your current medications as directed. Please refer to the Current Medication list given to you today.   Labwork: TODAY BMET  Testing/Procedures: Your physician has requested that you have a carotid duplex. This test is an ultrasound of the carotid arteries in your neck. It looks at blood flow through these arteries that supply the brain with blood. Allow one hour for this exam. There are no restrictions or special instructions. THIS CAN BE DONE IN THE NEXT 2-3 MONTHS    Follow-Up: DR. Percival Spanish on 10/09/17 @ 9 AM    Any Other Special Instructions Will Be Listed Below (If Applicable).     If you need a refill on your cardiac medications before your next appointment, please call your pharmacy.

## 2017-06-30 NOTE — Progress Notes (Signed)
06/30/2017 Joanna Hill   [birth year]  [id number]  Primary Physician Seward Carol, MD Primary Cardiologist: Dr Percival Spanish  HPI:  Joanna Hill is an 83 y/o female followed by Dr Percival Spanish with a history of CAF, Lt brain stroke in 2010, moderate carotid disease, breast cancer s/p mastectomy, hypothyroidism, and diabetes. She is a resident of [facility]. She is wheel chair bound. She saw Dr Warren Lacy 06/23/17 and he felt she was volume overloaded (edema) and he increased her diuretics to BID x 4 days. She is in the office today for follow up. She denies any orthopnea or SOB at rest.    Current Outpatient Medications  Medication Sig Dispense Refill  . acetaminophen (TYLENOL) 325 MG tablet Take 650 mg by mouth every 6 (six) hours as needed for mild pain, moderate pain, fever or headache.     Marland Kitchen antiseptic oral rinse (BIOTENE) LIQD 5 mLs by Mouth Rinse route 2 (two) times daily.    . cetirizine (ZYRTEC) 10 MG tablet Take 1 tablet (10 mg total) by mouth 2 (two) times daily. 60 tablet 5  . cyproheptadine (PERIACTIN) 4 MG tablet Take one tablet twice daily 60 tablet 5  . diclofenac sodium (VOLTAREN) 1 % GEL May use gel to right hip and thigh as needed for pain.    Marland Kitchen diltiazem (CARDIZEM CD) 240 MG 24 hr capsule Take 1 capsule (240 mg total) by mouth daily. 30 capsule 6  . diphenhydrAMINE (BENADRYL) 25 mg capsule Take 25 mg by mouth every 6 (six) hours as needed.    . docusate sodium (COLACE) 100 MG capsule Take 100 mg by mouth 2 (two) times daily.    Marland Kitchen donepezil (ARICEPT) 10 MG tablet Take 10 mg by mouth at bedtime.    . Eyelid Cleansers (OCUSOFT LID SCRUB) PADS Apply 1 each topically 2 (two) times daily.    . fluticasone (FLONASE) 50 MCG/ACT nasal spray Place 1 spray into both nostrils 2 (two) times daily.     . Fluvoxamine Maleate (LUVOX CR) 150 MG CP24 Take 150 mg by mouth at bedtime.    . furosemide (LASIX) 40 MG tablet Take 1 tablet (40 mg total) daily by mouth. 90 tablet 3  . gabapentin  (NEURONTIN) 300 MG capsule Take 300 mg by mouth 2 (two) times daily.    . GuaiFENesin (MUCINEX PO) Take 600 mg by mouth 2 (two) times daily.    . insulin aspart (NOVOLOG) 100 UNIT/ML injection Inject into the skin daily. BL. MOD. ACCUCHECK WITH S/S USE NOVOLOG INSULIN  101-150 = 2U 151-200= 3U 201-250 = 5U 251-300 = 7U 310-350 = 9U >350 = 11U CALL MD FOR BS.400 OR < 60 Taken at 6am 11:30 am 14:30 and 21:00    . ketotifen (ZADITOR) 0.025 % ophthalmic solution Place 1 drop into both eyes 2 (two) times daily.     Marland Kitchen levalbuterol (XOPENEX) 0.63 MG/3ML nebulizer solution Take 0.63 mg every 6 (six) hours as needed by nebulization for wheezing or shortness of breath.     Marland Kitchen LEVEMIR FLEXTOUCH 100 UNIT/ML Pen Inject 24 Units into the skin daily at 10 pm. 15 mL 0  . magic mouthwash SOLN Take 5 mLs by mouth 2 (two) times daily.    . mirtazapine (REMERON) 15 MG tablet Take 15 mg by mouth at bedtime.    Marland Kitchen neomycin-polymyxin-dexameth (MAXITROL) 0.1 % OINT Place 1 application into both eyes at bedtime.    . nitroGLYCERIN (NITROSTAT) 0.4 MG SL tablet Place 0.4 mg every 5 (  five) minutes as needed under the tongue for chest pain.     Marland Kitchen OVER THE COUNTER MEDICATION Place 1 application into both eyes at bedtime. Vaseline white petroleum Jelly    . pantoprazole (PROTONIX) 40 MG tablet Take 40 mg by mouth daily.     Vladimir Faster Glycol-Propyl Glycol (SYSTANE) 0.4-0.3 % GEL ophthalmic gel Place 1 application into both eyes at bedtime.    . polyethylene glycol (MIRALAX / GLYCOLAX) packet Take 17 g by mouth daily as needed. 14 each 0  . potassium chloride SA (K-DUR,KLOR-CON) 20 MEQ tablet Take 20 mEq daily by mouth.     . ranitidine (ZANTAC) 150 MG tablet Take 150 mg by mouth 2 (two) times daily.    . Rivaroxaban (XARELTO) 15 MG TABS tablet Take 15 mg by mouth daily with supper.    . Saline 0.65 % (Soln) SOLN Place into the nose every 2 (two) hours as needed.    . sucralfate (CARAFATE) 1 g tablet Take 1 g by mouth 4  (four) times daily.     . traMADol (ULTRAM) 50 MG tablet Take 50 mg every 8 (eight) hours as needed by mouth for moderate pain.     . traZODone (DESYREL) 50 MG tablet Take 25 mg at bedtime by mouth.     . triamcinolone cream (KENALOG) 0.5 %      No current facility-administered medications for this visit.     No Known Allergies  Past Medical History:  Diagnosis Date  . Atrial fibrillation with RVR ([place]) 02/28/2017  . Blindness   . Cancer ([place])    breast  . Carotid artery occlusion   . Dementia   . Diabetes mellitus   . GERD (gastroesophageal reflux disease)   . Hyperlipidemia   . Hypertension   . Hypothyroidism   . Knee injury 2018  . Stroke [facility]) 2009 and  2011   difficulty with swallowing    Social History   Socioeconomic History  . Marital status: Widowed    Spouse name: Not on file  . Number of children: 3  . Years of education: 1st  . Highest education level: Not on file  Social Needs  . Financial resource strain: Not on file  . Food insecurity - worry: Not on file  . Food insecurity - inability: Not on file  . Transportation needs - medical: Not on file  . Transportation needs - non-medical: Not on file  Occupational History  . Not on file  Tobacco Use  . Smoking status: Never Smoker  . Smokeless tobacco: Former Systems developer    Types: Snuff  Substance and Sexual Activity  . Alcohol use: No  . Drug use: No  . Sexual activity: Not on file  Other Topics Concern  . Not on file  Social History Narrative   Patient is a resident at [facility], has 3 children   Patient is left handed   Educational level 1st grade   Caffeine consumption is 1-2 daily     Family History  Problem Relation Age of Onset  . Cancer Mother   . Heart disease Father        Heart disease before age 6  . Asthma Sister   . Diabetes Sister   . Breast cancer Sister   . Diabetes Sister   . Diabetes Sister   . Diabetes Sister   . Diabetes Sister   . Diabetes Sister    . Diabetes Sister   . Diabetes Sister   .  Diabetes Sister      Review of Systems: General: negative for chills, fever, night sweats or weight changes.  Cardiovascular: negative for chest pain, dyspnea on exertion, edema, orthopnea, palpitations, paroxysmal nocturnal dyspnea or shortness of breath Dermatological: negative for rash Respiratory: negative for cough or wheezing Urologic: negative for hematuria Abdominal: negative for nausea, vomiting, diarrhea, bright red blood per rectum, melena, or hematemesis Neurologic: negative for visual changes, syncope, or dizziness All other systems reviewed and are otherwise negative except as noted above.    Blood pressure 136/86, pulse 90, height 5\' 4"  (1.626 m), weight 159 lb (72.1 kg).  General appearance: alert, cooperative, appears stated age, no distress and mildly obese Neck: no carotid bruit and no JVD Lungs: few basilar crackles  Heart: irregularly irregular rhythm Extremities: RLE without edema, compression stocking in place. LLE 1+ edema with dressing in place-? venous ulcer, RUE edema (s/p mastectomy) Skin: pale, cool, dry Neurologic: Grossly normal, she has residual hesitant speech from her stroke   ASSESSMENT AND PLAN:   Acute on chronic diastolic heart failure ([place]) Seen by Dr Percival Spanish 06/23/17- Lasix and K+ increased x 4 days, pt here for follow up. She appears to be compensated  Permanent atrial fibrillation (HCC) Rate controlled  Current use of long term anticoagulation CHADs VASc=7- on Xarelto  Carotid artery disease (HCC) Moderate carotid disease by doppler, remote Lt brain stroke. She has seen Dr Scot Dock in the past. Due for follow up dopplers  Essential hypertension Controlled  History of CVA in adulthood Lt brain stroke 2010 with residualdifficulty with swallowing and speech. Wheelchair bound   PLAN  Per Dr [employer] instructions we will obtain BMP and schedule for carotid dopplers.   Kerin Ransom  PA-C 06/30/2017 9:59 AM

## 2017-06-30 NOTE — Addendum Note (Signed)
Addended by: Waylan Rocher on: 06/30/2017 10:26 AM   Modules accepted: Orders

## 2017-06-30 NOTE — Assessment & Plan Note (Signed)
CHADs VASc=7- on Xarelto

## 2017-07-01 LAB — BASIC METABOLIC PANEL
BUN/Creatinine Ratio: 19 (ref 12–28)
BUN: 18 mg/dL (ref 8–27)
CO2: 29 mmol/L (ref 20–29)
Calcium: 9.2 mg/dL (ref 8.7–10.3)
Chloride: 103 mmol/L (ref 96–106)
Creatinine, Ser: 0.97 mg/dL (ref 0.57–1.00)
GFR calc Af Amer: 62 mL/min/{1.73_m2} (ref >59)
GFR calc non Af Amer: 54 mL/min/{1.73_m2} — ABNORMAL LOW (ref >59)
Glucose: 213 mg/dL — ABNORMAL HIGH (ref 65–99)
Potassium: 4.4 mmol/L (ref 3.5–5.2)
Sodium: 147 mmol/L — ABNORMAL HIGH (ref 134–144)

## 2017-07-03 DIAGNOSIS — I5032 Chronic diastolic (congestive) heart failure: Secondary | ICD-10-CM | POA: Diagnosis not present

## 2017-07-03 DIAGNOSIS — N183 Chronic kidney disease, stage 3 (moderate): Secondary | ICD-10-CM | POA: Diagnosis not present

## 2017-07-03 DIAGNOSIS — I4891 Unspecified atrial fibrillation: Secondary | ICD-10-CM | POA: Diagnosis not present

## 2017-07-03 DIAGNOSIS — R05 Cough: Secondary | ICD-10-CM | POA: Diagnosis not present

## 2017-07-05 DIAGNOSIS — S81802D Unspecified open wound, left lower leg, subsequent encounter: Secondary | ICD-10-CM | POA: Diagnosis not present

## 2017-07-12 DIAGNOSIS — G4700 Insomnia, unspecified: Secondary | ICD-10-CM | POA: Diagnosis not present

## 2017-07-12 DIAGNOSIS — F429 Obsessive-compulsive disorder, unspecified: Secondary | ICD-10-CM | POA: Diagnosis not present

## 2017-07-12 DIAGNOSIS — F0280 Dementia in other diseases classified elsewhere without behavioral disturbance: Secondary | ICD-10-CM | POA: Diagnosis not present

## 2017-07-12 DIAGNOSIS — F330 Major depressive disorder, recurrent, mild: Secondary | ICD-10-CM | POA: Diagnosis not present

## 2017-07-12 DIAGNOSIS — G301 Alzheimer's disease with late onset: Secondary | ICD-10-CM | POA: Diagnosis not present

## 2017-07-13 DIAGNOSIS — G4700 Insomnia, unspecified: Secondary | ICD-10-CM | POA: Diagnosis not present

## 2017-07-13 DIAGNOSIS — F419 Anxiety disorder, unspecified: Secondary | ICD-10-CM | POA: Diagnosis not present

## 2017-07-13 DIAGNOSIS — F429 Obsessive-compulsive disorder, unspecified: Secondary | ICD-10-CM | POA: Diagnosis not present

## 2017-07-13 DIAGNOSIS — F330 Major depressive disorder, recurrent, mild: Secondary | ICD-10-CM | POA: Diagnosis not present

## 2017-07-13 DIAGNOSIS — G301 Alzheimer's disease with late onset: Secondary | ICD-10-CM | POA: Diagnosis not present

## 2017-07-13 DIAGNOSIS — F0280 Dementia in other diseases classified elsewhere without behavioral disturbance: Secondary | ICD-10-CM | POA: Diagnosis not present

## 2017-07-19 DIAGNOSIS — F419 Anxiety disorder, unspecified: Secondary | ICD-10-CM | POA: Diagnosis not present

## 2017-07-26 DIAGNOSIS — I4891 Unspecified atrial fibrillation: Secondary | ICD-10-CM | POA: Diagnosis not present

## 2017-07-26 DIAGNOSIS — I10 Essential (primary) hypertension: Secondary | ICD-10-CM | POA: Diagnosis not present

## 2017-07-26 DIAGNOSIS — F0390 Unspecified dementia without behavioral disturbance: Secondary | ICD-10-CM | POA: Diagnosis not present

## 2017-07-26 DIAGNOSIS — D638 Anemia in other chronic diseases classified elsewhere: Secondary | ICD-10-CM | POA: Diagnosis not present

## 2017-07-26 DIAGNOSIS — E119 Type 2 diabetes mellitus without complications: Secondary | ICD-10-CM | POA: Diagnosis not present

## 2017-07-26 DIAGNOSIS — F419 Anxiety disorder, unspecified: Secondary | ICD-10-CM | POA: Diagnosis not present

## 2017-07-30 DIAGNOSIS — R278 Other lack of coordination: Secondary | ICD-10-CM | POA: Diagnosis not present

## 2017-07-30 DIAGNOSIS — J45909 Unspecified asthma, uncomplicated: Secondary | ICD-10-CM | POA: Diagnosis not present

## 2017-07-30 DIAGNOSIS — Z5181 Encounter for therapeutic drug level monitoring: Secondary | ICD-10-CM | POA: Diagnosis not present

## 2017-07-30 DIAGNOSIS — R1312 Dysphagia, oropharyngeal phase: Secondary | ICD-10-CM | POA: Diagnosis not present

## 2017-07-30 DIAGNOSIS — M6281 Muscle weakness (generalized): Secondary | ICD-10-CM | POA: Diagnosis not present

## 2017-07-30 DIAGNOSIS — I10 Essential (primary) hypertension: Secondary | ICD-10-CM | POA: Diagnosis not present

## 2017-07-30 DIAGNOSIS — R293 Abnormal posture: Secondary | ICD-10-CM | POA: Diagnosis not present

## 2017-07-30 DIAGNOSIS — J1089 Influenza due to other identified influenza virus with other manifestations: Secondary | ICD-10-CM | POA: Diagnosis not present

## 2017-07-30 DIAGNOSIS — R296 Repeated falls: Secondary | ICD-10-CM | POA: Diagnosis not present

## 2017-07-30 DIAGNOSIS — R1311 Dysphagia, oral phase: Secondary | ICD-10-CM | POA: Diagnosis not present

## 2017-07-31 DIAGNOSIS — R1311 Dysphagia, oral phase: Secondary | ICD-10-CM | POA: Diagnosis not present

## 2017-07-31 DIAGNOSIS — J1089 Influenza due to other identified influenza virus with other manifestations: Secondary | ICD-10-CM | POA: Diagnosis not present

## 2017-07-31 DIAGNOSIS — R296 Repeated falls: Secondary | ICD-10-CM | POA: Diagnosis not present

## 2017-07-31 DIAGNOSIS — R278 Other lack of coordination: Secondary | ICD-10-CM | POA: Diagnosis not present

## 2017-07-31 DIAGNOSIS — Z5181 Encounter for therapeutic drug level monitoring: Secondary | ICD-10-CM | POA: Diagnosis not present

## 2017-07-31 DIAGNOSIS — R293 Abnormal posture: Secondary | ICD-10-CM | POA: Diagnosis not present

## 2017-08-01 DIAGNOSIS — R278 Other lack of coordination: Secondary | ICD-10-CM | POA: Diagnosis not present

## 2017-08-01 DIAGNOSIS — R1311 Dysphagia, oral phase: Secondary | ICD-10-CM | POA: Diagnosis not present

## 2017-08-01 DIAGNOSIS — Z5181 Encounter for therapeutic drug level monitoring: Secondary | ICD-10-CM | POA: Diagnosis not present

## 2017-08-01 DIAGNOSIS — J1089 Influenza due to other identified influenza virus with other manifestations: Secondary | ICD-10-CM | POA: Diagnosis not present

## 2017-08-01 DIAGNOSIS — R293 Abnormal posture: Secondary | ICD-10-CM | POA: Diagnosis not present

## 2017-08-01 DIAGNOSIS — R296 Repeated falls: Secondary | ICD-10-CM | POA: Diagnosis not present

## 2017-08-02 DIAGNOSIS — Z5181 Encounter for therapeutic drug level monitoring: Secondary | ICD-10-CM | POA: Diagnosis not present

## 2017-08-02 DIAGNOSIS — R293 Abnormal posture: Secondary | ICD-10-CM | POA: Diagnosis not present

## 2017-08-02 DIAGNOSIS — R278 Other lack of coordination: Secondary | ICD-10-CM | POA: Diagnosis not present

## 2017-08-02 DIAGNOSIS — J1089 Influenza due to other identified influenza virus with other manifestations: Secondary | ICD-10-CM | POA: Diagnosis not present

## 2017-08-02 DIAGNOSIS — R296 Repeated falls: Secondary | ICD-10-CM | POA: Diagnosis not present

## 2017-08-02 DIAGNOSIS — R1311 Dysphagia, oral phase: Secondary | ICD-10-CM | POA: Diagnosis not present

## 2017-08-03 DIAGNOSIS — Z5181 Encounter for therapeutic drug level monitoring: Secondary | ICD-10-CM | POA: Diagnosis not present

## 2017-08-03 DIAGNOSIS — R278 Other lack of coordination: Secondary | ICD-10-CM | POA: Diagnosis not present

## 2017-08-03 DIAGNOSIS — R1311 Dysphagia, oral phase: Secondary | ICD-10-CM | POA: Diagnosis not present

## 2017-08-03 DIAGNOSIS — J1089 Influenza due to other identified influenza virus with other manifestations: Secondary | ICD-10-CM | POA: Diagnosis not present

## 2017-08-03 DIAGNOSIS — R293 Abnormal posture: Secondary | ICD-10-CM | POA: Diagnosis not present

## 2017-08-03 DIAGNOSIS — R296 Repeated falls: Secondary | ICD-10-CM | POA: Diagnosis not present

## 2017-08-06 DIAGNOSIS — R293 Abnormal posture: Secondary | ICD-10-CM | POA: Diagnosis not present

## 2017-08-06 DIAGNOSIS — R278 Other lack of coordination: Secondary | ICD-10-CM | POA: Diagnosis not present

## 2017-08-06 DIAGNOSIS — R1311 Dysphagia, oral phase: Secondary | ICD-10-CM | POA: Diagnosis not present

## 2017-08-06 DIAGNOSIS — J1089 Influenza due to other identified influenza virus with other manifestations: Secondary | ICD-10-CM | POA: Diagnosis not present

## 2017-08-06 DIAGNOSIS — Z5181 Encounter for therapeutic drug level monitoring: Secondary | ICD-10-CM | POA: Diagnosis not present

## 2017-08-06 DIAGNOSIS — R296 Repeated falls: Secondary | ICD-10-CM | POA: Diagnosis not present

## 2017-08-07 DIAGNOSIS — R1311 Dysphagia, oral phase: Secondary | ICD-10-CM | POA: Diagnosis not present

## 2017-08-07 DIAGNOSIS — Z5181 Encounter for therapeutic drug level monitoring: Secondary | ICD-10-CM | POA: Diagnosis not present

## 2017-08-07 DIAGNOSIS — J1089 Influenza due to other identified influenza virus with other manifestations: Secondary | ICD-10-CM | POA: Diagnosis not present

## 2017-08-07 DIAGNOSIS — R278 Other lack of coordination: Secondary | ICD-10-CM | POA: Diagnosis not present

## 2017-08-07 DIAGNOSIS — R293 Abnormal posture: Secondary | ICD-10-CM | POA: Diagnosis not present

## 2017-08-07 DIAGNOSIS — R296 Repeated falls: Secondary | ICD-10-CM | POA: Diagnosis not present

## 2017-08-09 DIAGNOSIS — R293 Abnormal posture: Secondary | ICD-10-CM | POA: Diagnosis not present

## 2017-08-09 DIAGNOSIS — J1089 Influenza due to other identified influenza virus with other manifestations: Secondary | ICD-10-CM | POA: Diagnosis not present

## 2017-08-09 DIAGNOSIS — R1311 Dysphagia, oral phase: Secondary | ICD-10-CM | POA: Diagnosis not present

## 2017-08-09 DIAGNOSIS — R296 Repeated falls: Secondary | ICD-10-CM | POA: Diagnosis not present

## 2017-08-09 DIAGNOSIS — R278 Other lack of coordination: Secondary | ICD-10-CM | POA: Diagnosis not present

## 2017-08-09 DIAGNOSIS — Z5181 Encounter for therapeutic drug level monitoring: Secondary | ICD-10-CM | POA: Diagnosis not present

## 2017-08-10 DIAGNOSIS — M25551 Pain in right hip: Secondary | ICD-10-CM | POA: Diagnosis not present

## 2017-08-10 DIAGNOSIS — Z5181 Encounter for therapeutic drug level monitoring: Secondary | ICD-10-CM | POA: Diagnosis not present

## 2017-08-10 DIAGNOSIS — R1311 Dysphagia, oral phase: Secondary | ICD-10-CM | POA: Diagnosis not present

## 2017-08-10 DIAGNOSIS — R278 Other lack of coordination: Secondary | ICD-10-CM | POA: Diagnosis not present

## 2017-08-10 DIAGNOSIS — R293 Abnormal posture: Secondary | ICD-10-CM | POA: Diagnosis not present

## 2017-08-10 DIAGNOSIS — J1089 Influenza due to other identified influenza virus with other manifestations: Secondary | ICD-10-CM | POA: Diagnosis not present

## 2017-08-10 DIAGNOSIS — M25552 Pain in left hip: Secondary | ICD-10-CM | POA: Diagnosis not present

## 2017-08-10 DIAGNOSIS — R296 Repeated falls: Secondary | ICD-10-CM | POA: Diagnosis not present

## 2017-08-11 ENCOUNTER — Emergency Department (HOSPITAL_COMMUNITY)
Admission: EM | Admit: 2017-08-11 | Discharge: 2017-08-11 | Disposition: A | Discharge status: Other Acute Inpatient Hospital | Payer: Medicare Other | Attending: Emergency Medicine | Admitting: Emergency Medicine

## 2017-08-11 ENCOUNTER — Encounter (HOSPITAL_COMMUNITY): Payer: Self-pay | Admitting: Nurse Practitioner

## 2017-08-11 ENCOUNTER — Emergency Department (HOSPITAL_COMMUNITY): Payer: Medicare Other

## 2017-08-11 ENCOUNTER — Other Ambulatory Visit: Payer: Self-pay

## 2017-08-11 DIAGNOSIS — Y9389 Activity, other specified: Secondary | ICD-10-CM | POA: Diagnosis not present

## 2017-08-11 DIAGNOSIS — Y92128 Other place in nursing home as the place of occurrence of the external cause: Secondary | ICD-10-CM | POA: Insufficient documentation

## 2017-08-11 DIAGNOSIS — T148XXA Other injury of unspecified body region, initial encounter: Secondary | ICD-10-CM | POA: Diagnosis not present

## 2017-08-11 DIAGNOSIS — M25552 Pain in left hip: Secondary | ICD-10-CM | POA: Diagnosis not present

## 2017-08-11 DIAGNOSIS — I259 Chronic ischemic heart disease, unspecified: Secondary | ICD-10-CM | POA: Diagnosis not present

## 2017-08-11 DIAGNOSIS — W07XXXA Fall from chair, initial encounter: Secondary | ICD-10-CM | POA: Insufficient documentation

## 2017-08-11 DIAGNOSIS — Z794 Long term (current) use of insulin: Secondary | ICD-10-CM | POA: Diagnosis not present

## 2017-08-11 DIAGNOSIS — Z7901 Long term (current) use of anticoagulants: Secondary | ICD-10-CM | POA: Insufficient documentation

## 2017-08-11 DIAGNOSIS — E119 Type 2 diabetes mellitus without complications: Secondary | ICD-10-CM | POA: Insufficient documentation

## 2017-08-11 DIAGNOSIS — R4182 Altered mental status, unspecified: Secondary | ICD-10-CM | POA: Diagnosis not present

## 2017-08-11 DIAGNOSIS — F0390 Unspecified dementia without behavioral disturbance: Secondary | ICD-10-CM | POA: Diagnosis not present

## 2017-08-11 DIAGNOSIS — G8911 Acute pain due to trauma: Secondary | ICD-10-CM | POA: Diagnosis not present

## 2017-08-11 DIAGNOSIS — Y999 Unspecified external cause status: Secondary | ICD-10-CM | POA: Insufficient documentation

## 2017-08-11 DIAGNOSIS — S7292XA Unspecified fracture of left femur, initial encounter for closed fracture: Secondary | ICD-10-CM | POA: Diagnosis not present

## 2017-08-11 DIAGNOSIS — E039 Hypothyroidism, unspecified: Secondary | ICD-10-CM | POA: Diagnosis not present

## 2017-08-11 DIAGNOSIS — S79912A Unspecified injury of left hip, initial encounter: Secondary | ICD-10-CM | POA: Diagnosis present

## 2017-08-11 DIAGNOSIS — I10 Essential (primary) hypertension: Secondary | ICD-10-CM | POA: Insufficient documentation

## 2017-08-11 DIAGNOSIS — S7002XA Contusion of left hip, initial encounter: Secondary | ICD-10-CM | POA: Diagnosis not present

## 2017-08-11 DIAGNOSIS — Z79899 Other long term (current) drug therapy: Secondary | ICD-10-CM | POA: Diagnosis not present

## 2017-08-11 NOTE — Discharge Instructions (Signed)
It was our pleasure to provide your ER care today - we hope that you feel better.  Your xrays here show no fracture or dislocation.  Take acetaminophen or ibuprofen as need.

## 2017-08-11 NOTE — ED Provider Notes (Signed)
[facility] DEPT Provider Note   CSN: [id number] Arrival date & time: 08/11/17  2025     History   Chief Complaint Chief Complaint  Patient presents with  . Left hip fracture    HPI Joanna Hill is a 83 y.o. female.  Patient c/o recent fall at [facility] last night, and left hip pain. Pain mild-mod constant, dull, non radiating, worse w movement. Pt indicates in chair/wheelchair at baseline, not ambulatory. Denies loc with fall. No faintness or dizziness. Denies head injury or headache. No neck or back pain. Skin intact. Denies other pain or injury.    The history is provided by the patient.    Past Medical History:  Diagnosis Date  . Atrial fibrillation with RVR ([place]) 02/28/2017  . Blindness   . Cancer ([place])    breast  . Carotid artery occlusion   . Dementia   . Diabetes mellitus   . GERD (gastroesophageal reflux disease)   . Hyperlipidemia   . Hypertension   . Hypothyroidism   . Knee injury 2018  . Stroke [facility]) 2009 and  2011   difficulty with swallowing    Patient Active Problem List   Diagnosis Date Noted  . Acute on chronic diastolic heart failure ([place]) 06/30/2017  . Permanent atrial fibrillation ([place]) 06/30/2017  . Leg swelling 06/23/2017  . Paroxysmal atrial fibrillation ([place]) 03/15/2017  . Current use of long term anticoagulation 03/15/2017  . Atrial fibrillation with RVR ([place]) 02/28/2017  . Hypothyroidism 02/28/2017  . Hypertension 02/28/2017  . History of CVA in adulthood 02/28/2017  . Dementia 02/28/2017  . Constipation 02/28/2017  . GERD (gastroesophageal reflux disease) 02/28/2017  . Diabetes mellitus type 2, insulin dependent ([place]) 02/28/2017  . Hyperkalemia 01/18/2014  . Carotid artery disease ([place]) 09/28/2011  . HYPOTENSION 08/05/2008  . DYSPHAGIA 08/05/2008  . ALTERED MENTAL STATUS 08/01/2008  . HYPERLIPIDEMIA 09/25/2007  . Essential hypertension 09/25/2007  . CORONARY ARTERY DISEASE 09/25/2007    Past  Surgical History:  Procedure Laterality Date  . ABDOMINAL HYSTERECTOMY    . Arch Aortogram  03-15-2010  . MASTECTOMY, PARTIAL Right   . ROTATOR CUFF REPAIR Left 1981    OB History    No data available       Home Medications    Prior to Admission medications   Medication Sig Start Date End Date Taking? Authorizing Provider  acetaminophen (TYLENOL) 325 MG tablet Take 650 mg by mouth every 6 (six) hours as needed for mild pain, moderate pain, fever or headache.     [provider]  antiseptic oral rinse (BIOTENE) LIQD 5 mLs by Mouth Rinse route 2 (two) times daily.    [provider]  cetirizine (ZYRTEC) 10 MG tablet Take 1 tablet (10 mg total) by mouth 2 (two) times daily. 05/31/17   Kozlow, Donnamarie Poag, MD  cyproheptadine (PERIACTIN) 4 MG tablet Take one tablet twice daily 07/12/16   Kozlow, Donnamarie Poag, MD  diclofenac sodium (VOLTAREN) 1 % GEL May use gel to right hip and thigh as needed for pain.    [provider]  diltiazem (CARDIZEM CD) 240 MG 24 hr capsule Take 1 capsule (240 mg total) by mouth daily. 03/15/17   Strader, Fransisco Hertz, PA-C  diphenhydrAMINE (BENADRYL) 25 mg capsule Take 25 mg by mouth every 6 (six) hours as needed.    [provider]  docusate sodium (COLACE) 100 MG capsule Take 100 mg by mouth 2 (two) times daily.    [provider]  donepezil (ARICEPT) 10 MG tablet Take 10 mg by mouth at bedtime.    [provider]  Eyelid Cleansers (OCUSOFT LID SCRUB) PADS Apply 1 each topically 2 (two) times daily.    [provider]  fluticasone (FLONASE) 50 MCG/ACT nasal spray Place 1 spray into both nostrils 2 (two) times daily.     [provider]  Fluvoxamine Maleate (LUVOX CR) 150 MG CP24 Take 150 mg by mouth at bedtime.    [provider]  furosemide (LASIX) 40 MG tablet Take 1 tablet (40 mg total) daily by mouth. 06/23/17   Minus Breeding, MD  gabapentin (NEURONTIN) 300 MG capsule Take 300 mg by mouth 2  (two) times daily.    [provider]  GuaiFENesin (MUCINEX PO) Take 600 mg by mouth 2 (two) times daily.    [provider]  insulin aspart (NOVOLOG) 100 UNIT/ML injection Inject into the skin daily. BL. MOD. ACCUCHECK WITH S/S USE NOVOLOG INSULIN  101-150 = 2U 151-200= 3U 201-250 = 5U 251-300 = 7U 310-350 = 9U >350 = 11U CALL MD FOR BS.400 OR < 60 Taken at 6am 11:30 am 14:30 and 21:00    [provider]  ketotifen (ZADITOR) 0.025 % ophthalmic solution Place 1 drop into both eyes 2 (two) times daily.     [provider]  levalbuterol Penne Lash) 0.63 MG/3ML nebulizer solution Take 0.63 mg every 6 (six) hours as needed by nebulization for wheezing or shortness of breath.  04/27/17   [provider]  LEVEMIR FLEXTOUCH 100 UNIT/ML Pen Inject 24 Units into the skin daily at 10 pm. 03/02/17   Rosita Fire, MD  magic mouthwash SOLN Take 5 mLs by mouth 2 (two) times daily.    [provider]  mirtazapine (REMERON) 15 MG tablet Take 15 mg by mouth at bedtime.    [provider]  neomycin-polymyxin-dexameth (MAXITROL) 0.1 % OINT Place 1 application into both eyes at bedtime.    [provider]  nitroGLYCERIN (NITROSTAT) 0.4 MG SL tablet Place 0.4 mg every 5 (five) minutes as needed under the tongue for chest pain.  04/25/17   [provider]  OVER THE COUNTER MEDICATION Place 1 application into both eyes at bedtime. Vaseline white petroleum Jelly    [provider]  pantoprazole (PROTONIX) 40 MG tablet Take 40 mg by mouth daily.  11/21/16   [provider]  Polyethyl Glycol-Propyl Glycol (SYSTANE) 0.4-0.3 % GEL ophthalmic gel Place 1 application into both eyes at bedtime.    [provider]  polyethylene glycol (MIRALAX / GLYCOLAX) packet Take 17 g by mouth daily as needed. 03/02/17   Rosita Fire, MD  potassium chloride SA (K-DUR,KLOR-CON) 20 MEQ tablet Take 20 mEq daily by mouth.   04/25/17   [provider]  ranitidine (ZANTAC) 150 MG tablet Take 150 mg by mouth 2 (two) times daily.    [provider]  Rivaroxaban (XARELTO) 15 MG TABS tablet Take 15 mg by mouth daily with supper.    [provider]  Saline 0.65 % (Soln) SOLN Place into the nose every 2 (two) hours as needed.    [provider]  sucralfate (CARAFATE) 1 g tablet Take 1 g by mouth 4 (four) times daily.  01/20/16   [provider]  traMADol (ULTRAM) 50 MG tablet Take 50 mg every 8 (eight) hours as needed by mouth for moderate pain.     [provider]  traZODone (DESYREL) 50 MG tablet Take  25 mg at bedtime by mouth.  05/24/17   [provider]  triamcinolone cream (KENALOG) 0.5 %  10/14/16   [provider]    Family History Family History  Problem Relation Age of Onset  . Cancer Mother   . Heart disease Father        Heart disease before age 60  . Asthma Sister   . Diabetes Sister   . Breast cancer Sister   . Diabetes Sister   . Diabetes Sister   . Diabetes Sister   . Diabetes Sister   . Diabetes Sister   . Diabetes Sister   . Diabetes Sister   . Diabetes Sister     Social History Social History   Tobacco Use  . Smoking status: Never Smoker  . Smokeless tobacco: Former Systems developer    Types: Snuff  Substance Use Topics  . Alcohol use: No  . Drug use: No     Allergies   Patient has no known allergies.   Review of Systems Review of Systems  Constitutional: Negative for fever.  HENT: Negative for nosebleeds.   Eyes: Negative for redness.  Respiratory: Negative for shortness of breath.   Cardiovascular: Negative for chest pain.  Gastrointestinal: Negative for abdominal pain and blood in stool.  Genitourinary: Negative for flank pain.  Musculoskeletal: Negative for back pain and neck pain.  Skin: Negative for wound.  Neurological: Negative for weakness, numbness and headaches.  Hematological: Does not bruise/bleed  easily.  Psychiatric/Behavioral: Negative for confusion.     Physical Exam Updated Vital Signs BP (!) 156/89 (BP Location: Left Arm)   Pulse 90   Temp 97.7 F (36.5 C) (Oral)   Resp 18   Ht 1.626 m (5\' 4" )   Wt 65.3 kg (144 lb)   SpO2 96%   BMI 24.72 kg/m   Physical Exam  Constitutional: She appears well-developed and well-nourished. No distress.  HENT:  Head: Atraumatic.  Mouth/Throat: Oropharynx is clear and moist.  Eyes: Conjunctivae are normal. Pupils are equal, round, and reactive to light. No scleral icterus.  Neck: Normal range of motion. Neck supple. No tracheal deviation present.  Cardiovascular: Normal rate, regular rhythm, normal heart sounds and intact distal pulses.  Pulmonary/Chest: Effort normal and breath sounds normal. No respiratory distress. She exhibits no tenderness.  Abdominal: Soft. Normal appearance and bowel sounds are normal. She exhibits no distension. There is no tenderness.  Musculoskeletal: She exhibits no edema.  Left hip pain with rom. Distal pulses palp bil. No other focal bony tenderness on bil extremity exam. CTLS spine, non tender, aligned, no step off.   Neurological: She is alert.  Motor intact bil, stre 5/5. sens grossly intact.   Skin: Skin is warm and dry. No rash noted. She is not diaphoretic.  Psychiatric: She has a normal mood and affect.  Nursing note and vitals reviewed.    ED Treatments / Results  Labs (all labs ordered are listed, but only abnormal results are displayed) Labs Reviewed - No data to display  EKG  EKG Interpretation None       Radiology Ct Hip Left Wo Contrast  Result Date: 08/11/2017 CLINICAL DATA:  Left hip pain after falling last night. On blood thinners. EXAM: CT OF THE LEFT HIP WITHOUT CONTRAST TECHNIQUE: Multidetector CT imaging of the left hip was performed according to the standard protocol. Multiplanar CT image reconstructions were also generated. COMPARISON:  Radiographs 08/03/2017.  Pelvic  CT 02/28/2017. FINDINGS: Bones/Joint/Cartilage Examination limited to the left hip  and left hemipelvis. The bones are adequately mineralized. No evidence of acute fracture or dislocation. No significant left hip arthropathy or joint effusion. There is mild spurring of the greater trochanter. There are mild degenerative changes at the left sacroiliac joint and mild chondrocalcinosis at the symphysis pubis. Ligaments Suboptimally assessed by CT. Muscles and Tendons No intramuscular hematoma identified. Mild fatty atrophy of the gluteus minimus muscle. No tendon abnormalities identified. Soft tissues Sigmoid colon diverticular changes and aortoiliac atherosclerosis noted. No evidence of pelvic hematoma. IMPRESSION: 1. No evidence of acute left hip fracture or dislocation. 2. No significant joint effusion, periarticular hematoma or underlying arthropathy. 3. Sigmoid diverticulosis. Electronically Signed   By: Richardean Sale M.D.   On: 08/11/2017 11:48   Dg Hip Unilat W Or W/o Pelvis 2-3 Views Left  Result Date: 08/11/2017 CLINICAL DATA:  Patient fell last night at [facility] facility got up by herself. Staff had mobile x-ray come out and confirmed acute hairline fracture at left femur per RN note; pt was able to move the leg freely during xray EXAM: DG HIP (WITH OR WITHOUT PELVIS) 2-3V LEFT COMPARISON:  None. FINDINGS: Patient is rotated on AP view of the pelvis. There is no evidence for acute fracture or subluxation. Degenerative changes are seen in the lower spine. There is atherosclerotic calcification of the femoral arteries. IMPRESSION: No evidence for acute  abnormality. If there is continued concern for nondisplaced fracture, MRI is recommended. Electronically Signed   By: Nolon Nations M.D.   On: 08/11/2017 10:28    Procedures Procedures (including critical care time)  Medications Ordered in ED Medications - No data to display   Initial Impression / Assessment and Plan / ED Course  I  have reviewed the triage vital signs and the nursing notes.  Pertinent labs & imaging results that were available during my care of the patient were reviewed by me and considered in my medical decision making (see chart for details).  Xrays.  Reviewed nursing notes and prior charts for additional history.   No fracture on plain films or CT - ?whether reading/report from mobile outpatient film was not a true finding.   Patient stable for d/c.     Final Clinical Impressions(s) / ED Diagnoses   Final diagnoses:  None    ED Discharge Orders    None       Lajean Saver, MD 08/11/17 1153

## 2017-08-11 NOTE — ED Notes (Signed)
Patient provided with ham sandwich and 2 orange juices. Patient ate all she was provided.

## 2017-08-11 NOTE — ED Triage Notes (Signed)
Patient fell last night at [facility] facility got up by herself. Staff had mobile x-ray come out and confirmed acute hairline fracture at left femur. Patient states she did not hit her head. Patient does take blood thinners.

## 2017-08-11 NOTE — ED Notes (Signed)
Bed: WA04 Expected date:  Expected time:  Means of arrival:  Comments: Hip fracture

## 2017-08-11 NOTE — ED Notes (Signed)
Patient transported to nursing home via [facility].

## 2017-08-14 DIAGNOSIS — R278 Other lack of coordination: Secondary | ICD-10-CM | POA: Diagnosis not present

## 2017-08-14 DIAGNOSIS — R296 Repeated falls: Secondary | ICD-10-CM | POA: Diagnosis not present

## 2017-08-14 DIAGNOSIS — R293 Abnormal posture: Secondary | ICD-10-CM | POA: Diagnosis not present

## 2017-08-14 DIAGNOSIS — R1311 Dysphagia, oral phase: Secondary | ICD-10-CM | POA: Diagnosis not present

## 2017-08-14 DIAGNOSIS — Z5181 Encounter for therapeutic drug level monitoring: Secondary | ICD-10-CM | POA: Diagnosis not present

## 2017-08-14 DIAGNOSIS — J1089 Influenza due to other identified influenza virus with other manifestations: Secondary | ICD-10-CM | POA: Diagnosis not present

## 2017-08-16 DIAGNOSIS — R1311 Dysphagia, oral phase: Secondary | ICD-10-CM | POA: Diagnosis not present

## 2017-08-16 DIAGNOSIS — G4700 Insomnia, unspecified: Secondary | ICD-10-CM | POA: Diagnosis not present

## 2017-08-16 DIAGNOSIS — R293 Abnormal posture: Secondary | ICD-10-CM | POA: Diagnosis not present

## 2017-08-16 DIAGNOSIS — G301 Alzheimer's disease with late onset: Secondary | ICD-10-CM | POA: Diagnosis not present

## 2017-08-16 DIAGNOSIS — J1089 Influenza due to other identified influenza virus with other manifestations: Secondary | ICD-10-CM | POA: Diagnosis not present

## 2017-08-16 DIAGNOSIS — R1312 Dysphagia, oropharyngeal phase: Secondary | ICD-10-CM | POA: Diagnosis not present

## 2017-08-16 DIAGNOSIS — R296 Repeated falls: Secondary | ICD-10-CM | POA: Diagnosis not present

## 2017-08-16 DIAGNOSIS — F419 Anxiety disorder, unspecified: Secondary | ICD-10-CM | POA: Diagnosis not present

## 2017-08-16 DIAGNOSIS — F0280 Dementia in other diseases classified elsewhere without behavioral disturbance: Secondary | ICD-10-CM | POA: Diagnosis not present

## 2017-08-16 DIAGNOSIS — M6281 Muscle weakness (generalized): Secondary | ICD-10-CM | POA: Diagnosis not present

## 2017-08-16 DIAGNOSIS — F330 Major depressive disorder, recurrent, mild: Secondary | ICD-10-CM | POA: Diagnosis not present

## 2017-08-16 DIAGNOSIS — M1990 Unspecified osteoarthritis, unspecified site: Secondary | ICD-10-CM | POA: Diagnosis not present

## 2017-08-16 DIAGNOSIS — Z5181 Encounter for therapeutic drug level monitoring: Secondary | ICD-10-CM | POA: Diagnosis not present

## 2017-08-16 DIAGNOSIS — J45909 Unspecified asthma, uncomplicated: Secondary | ICD-10-CM | POA: Diagnosis not present

## 2017-08-16 DIAGNOSIS — R278 Other lack of coordination: Secondary | ICD-10-CM | POA: Diagnosis not present

## 2017-08-16 DIAGNOSIS — I10 Essential (primary) hypertension: Secondary | ICD-10-CM | POA: Diagnosis not present

## 2017-08-17 DIAGNOSIS — R278 Other lack of coordination: Secondary | ICD-10-CM | POA: Diagnosis not present

## 2017-08-17 DIAGNOSIS — J1089 Influenza due to other identified influenza virus with other manifestations: Secondary | ICD-10-CM | POA: Diagnosis not present

## 2017-08-17 DIAGNOSIS — R296 Repeated falls: Secondary | ICD-10-CM | POA: Diagnosis not present

## 2017-08-17 DIAGNOSIS — Z5181 Encounter for therapeutic drug level monitoring: Secondary | ICD-10-CM | POA: Diagnosis not present

## 2017-08-17 DIAGNOSIS — R1311 Dysphagia, oral phase: Secondary | ICD-10-CM | POA: Diagnosis not present

## 2017-08-17 DIAGNOSIS — R293 Abnormal posture: Secondary | ICD-10-CM | POA: Diagnosis not present

## 2017-08-18 DIAGNOSIS — R293 Abnormal posture: Secondary | ICD-10-CM | POA: Diagnosis not present

## 2017-08-18 DIAGNOSIS — J1089 Influenza due to other identified influenza virus with other manifestations: Secondary | ICD-10-CM | POA: Diagnosis not present

## 2017-08-18 DIAGNOSIS — R1311 Dysphagia, oral phase: Secondary | ICD-10-CM | POA: Diagnosis not present

## 2017-08-18 DIAGNOSIS — Z5181 Encounter for therapeutic drug level monitoring: Secondary | ICD-10-CM | POA: Diagnosis not present

## 2017-08-18 DIAGNOSIS — R296 Repeated falls: Secondary | ICD-10-CM | POA: Diagnosis not present

## 2017-08-18 DIAGNOSIS — R278 Other lack of coordination: Secondary | ICD-10-CM | POA: Diagnosis not present

## 2017-08-19 DIAGNOSIS — R296 Repeated falls: Secondary | ICD-10-CM | POA: Diagnosis not present

## 2017-08-19 DIAGNOSIS — J1089 Influenza due to other identified influenza virus with other manifestations: Secondary | ICD-10-CM | POA: Diagnosis not present

## 2017-08-19 DIAGNOSIS — R293 Abnormal posture: Secondary | ICD-10-CM | POA: Diagnosis not present

## 2017-08-19 DIAGNOSIS — R278 Other lack of coordination: Secondary | ICD-10-CM | POA: Diagnosis not present

## 2017-08-19 DIAGNOSIS — Z5181 Encounter for therapeutic drug level monitoring: Secondary | ICD-10-CM | POA: Diagnosis not present

## 2017-08-19 DIAGNOSIS — R1311 Dysphagia, oral phase: Secondary | ICD-10-CM | POA: Diagnosis not present

## 2017-08-21 DIAGNOSIS — J1089 Influenza due to other identified influenza virus with other manifestations: Secondary | ICD-10-CM | POA: Diagnosis not present

## 2017-08-21 DIAGNOSIS — R278 Other lack of coordination: Secondary | ICD-10-CM | POA: Diagnosis not present

## 2017-08-21 DIAGNOSIS — R296 Repeated falls: Secondary | ICD-10-CM | POA: Diagnosis not present

## 2017-08-21 DIAGNOSIS — Z5181 Encounter for therapeutic drug level monitoring: Secondary | ICD-10-CM | POA: Diagnosis not present

## 2017-08-21 DIAGNOSIS — R1311 Dysphagia, oral phase: Secondary | ICD-10-CM | POA: Diagnosis not present

## 2017-08-21 DIAGNOSIS — R293 Abnormal posture: Secondary | ICD-10-CM | POA: Diagnosis not present

## 2017-08-22 DIAGNOSIS — R278 Other lack of coordination: Secondary | ICD-10-CM | POA: Diagnosis not present

## 2017-08-22 DIAGNOSIS — J1089 Influenza due to other identified influenza virus with other manifestations: Secondary | ICD-10-CM | POA: Diagnosis not present

## 2017-08-22 DIAGNOSIS — R1311 Dysphagia, oral phase: Secondary | ICD-10-CM | POA: Diagnosis not present

## 2017-08-22 DIAGNOSIS — Z5181 Encounter for therapeutic drug level monitoring: Secondary | ICD-10-CM | POA: Diagnosis not present

## 2017-08-22 DIAGNOSIS — R296 Repeated falls: Secondary | ICD-10-CM | POA: Diagnosis not present

## 2017-08-22 DIAGNOSIS — R293 Abnormal posture: Secondary | ICD-10-CM | POA: Diagnosis not present

## 2017-08-23 DIAGNOSIS — R293 Abnormal posture: Secondary | ICD-10-CM | POA: Diagnosis not present

## 2017-08-23 DIAGNOSIS — R296 Repeated falls: Secondary | ICD-10-CM | POA: Diagnosis not present

## 2017-08-23 DIAGNOSIS — R278 Other lack of coordination: Secondary | ICD-10-CM | POA: Diagnosis not present

## 2017-08-23 DIAGNOSIS — J1089 Influenza due to other identified influenza virus with other manifestations: Secondary | ICD-10-CM | POA: Diagnosis not present

## 2017-08-23 DIAGNOSIS — R1311 Dysphagia, oral phase: Secondary | ICD-10-CM | POA: Diagnosis not present

## 2017-08-23 DIAGNOSIS — Z5181 Encounter for therapeutic drug level monitoring: Secondary | ICD-10-CM | POA: Diagnosis not present

## 2017-08-23 DIAGNOSIS — F419 Anxiety disorder, unspecified: Secondary | ICD-10-CM | POA: Diagnosis not present

## 2017-08-24 DIAGNOSIS — R1311 Dysphagia, oral phase: Secondary | ICD-10-CM | POA: Diagnosis not present

## 2017-08-24 DIAGNOSIS — J1089 Influenza due to other identified influenza virus with other manifestations: Secondary | ICD-10-CM | POA: Diagnosis not present

## 2017-08-24 DIAGNOSIS — R293 Abnormal posture: Secondary | ICD-10-CM | POA: Diagnosis not present

## 2017-08-24 DIAGNOSIS — Z5181 Encounter for therapeutic drug level monitoring: Secondary | ICD-10-CM | POA: Diagnosis not present

## 2017-08-24 DIAGNOSIS — R278 Other lack of coordination: Secondary | ICD-10-CM | POA: Diagnosis not present

## 2017-08-24 DIAGNOSIS — R296 Repeated falls: Secondary | ICD-10-CM | POA: Diagnosis not present

## 2017-08-25 DIAGNOSIS — J1089 Influenza due to other identified influenza virus with other manifestations: Secondary | ICD-10-CM | POA: Diagnosis not present

## 2017-08-25 DIAGNOSIS — R278 Other lack of coordination: Secondary | ICD-10-CM | POA: Diagnosis not present

## 2017-08-25 DIAGNOSIS — R1311 Dysphagia, oral phase: Secondary | ICD-10-CM | POA: Diagnosis not present

## 2017-08-25 DIAGNOSIS — R293 Abnormal posture: Secondary | ICD-10-CM | POA: Diagnosis not present

## 2017-08-25 DIAGNOSIS — R296 Repeated falls: Secondary | ICD-10-CM | POA: Diagnosis not present

## 2017-08-25 DIAGNOSIS — Z5181 Encounter for therapeutic drug level monitoring: Secondary | ICD-10-CM | POA: Diagnosis not present

## 2017-08-28 DIAGNOSIS — Z5181 Encounter for therapeutic drug level monitoring: Secondary | ICD-10-CM | POA: Diagnosis not present

## 2017-08-28 DIAGNOSIS — J1089 Influenza due to other identified influenza virus with other manifestations: Secondary | ICD-10-CM | POA: Diagnosis not present

## 2017-08-28 DIAGNOSIS — R296 Repeated falls: Secondary | ICD-10-CM | POA: Diagnosis not present

## 2017-08-28 DIAGNOSIS — R278 Other lack of coordination: Secondary | ICD-10-CM | POA: Diagnosis not present

## 2017-08-28 DIAGNOSIS — R293 Abnormal posture: Secondary | ICD-10-CM | POA: Diagnosis not present

## 2017-08-28 DIAGNOSIS — R1311 Dysphagia, oral phase: Secondary | ICD-10-CM | POA: Diagnosis not present

## 2017-08-29 DIAGNOSIS — R293 Abnormal posture: Secondary | ICD-10-CM | POA: Diagnosis not present

## 2017-08-29 DIAGNOSIS — R296 Repeated falls: Secondary | ICD-10-CM | POA: Diagnosis not present

## 2017-08-29 DIAGNOSIS — R1311 Dysphagia, oral phase: Secondary | ICD-10-CM | POA: Diagnosis not present

## 2017-08-29 DIAGNOSIS — J1089 Influenza due to other identified influenza virus with other manifestations: Secondary | ICD-10-CM | POA: Diagnosis not present

## 2017-08-29 DIAGNOSIS — Z5181 Encounter for therapeutic drug level monitoring: Secondary | ICD-10-CM | POA: Diagnosis not present

## 2017-08-29 DIAGNOSIS — R278 Other lack of coordination: Secondary | ICD-10-CM | POA: Diagnosis not present

## 2017-08-30 DIAGNOSIS — F429 Obsessive-compulsive disorder, unspecified: Secondary | ICD-10-CM | POA: Diagnosis not present

## 2017-08-30 DIAGNOSIS — R296 Repeated falls: Secondary | ICD-10-CM | POA: Diagnosis not present

## 2017-08-30 DIAGNOSIS — J1089 Influenza due to other identified influenza virus with other manifestations: Secondary | ICD-10-CM | POA: Diagnosis not present

## 2017-08-30 DIAGNOSIS — F0280 Dementia in other diseases classified elsewhere without behavioral disturbance: Secondary | ICD-10-CM | POA: Diagnosis not present

## 2017-08-30 DIAGNOSIS — R278 Other lack of coordination: Secondary | ICD-10-CM | POA: Diagnosis not present

## 2017-08-30 DIAGNOSIS — Z5181 Encounter for therapeutic drug level monitoring: Secondary | ICD-10-CM | POA: Diagnosis not present

## 2017-08-30 DIAGNOSIS — G4700 Insomnia, unspecified: Secondary | ICD-10-CM | POA: Diagnosis not present

## 2017-08-30 DIAGNOSIS — G301 Alzheimer's disease with late onset: Secondary | ICD-10-CM | POA: Diagnosis not present

## 2017-08-30 DIAGNOSIS — R1311 Dysphagia, oral phase: Secondary | ICD-10-CM | POA: Diagnosis not present

## 2017-08-30 DIAGNOSIS — F330 Major depressive disorder, recurrent, mild: Secondary | ICD-10-CM | POA: Diagnosis not present

## 2017-08-30 DIAGNOSIS — R293 Abnormal posture: Secondary | ICD-10-CM | POA: Diagnosis not present

## 2017-08-31 DIAGNOSIS — R296 Repeated falls: Secondary | ICD-10-CM | POA: Diagnosis not present

## 2017-08-31 DIAGNOSIS — R293 Abnormal posture: Secondary | ICD-10-CM | POA: Diagnosis not present

## 2017-08-31 DIAGNOSIS — J1089 Influenza due to other identified influenza virus with other manifestations: Secondary | ICD-10-CM | POA: Diagnosis not present

## 2017-08-31 DIAGNOSIS — Z5181 Encounter for therapeutic drug level monitoring: Secondary | ICD-10-CM | POA: Diagnosis not present

## 2017-08-31 DIAGNOSIS — R1311 Dysphagia, oral phase: Secondary | ICD-10-CM | POA: Diagnosis not present

## 2017-08-31 DIAGNOSIS — R278 Other lack of coordination: Secondary | ICD-10-CM | POA: Diagnosis not present

## 2017-09-01 DIAGNOSIS — J1089 Influenza due to other identified influenza virus with other manifestations: Secondary | ICD-10-CM | POA: Diagnosis not present

## 2017-09-01 DIAGNOSIS — R293 Abnormal posture: Secondary | ICD-10-CM | POA: Diagnosis not present

## 2017-09-01 DIAGNOSIS — Z5181 Encounter for therapeutic drug level monitoring: Secondary | ICD-10-CM | POA: Diagnosis not present

## 2017-09-01 DIAGNOSIS — R296 Repeated falls: Secondary | ICD-10-CM | POA: Diagnosis not present

## 2017-09-01 DIAGNOSIS — R1311 Dysphagia, oral phase: Secondary | ICD-10-CM | POA: Diagnosis not present

## 2017-09-01 DIAGNOSIS — R278 Other lack of coordination: Secondary | ICD-10-CM | POA: Diagnosis not present

## 2017-09-03 DIAGNOSIS — R05 Cough: Secondary | ICD-10-CM | POA: Diagnosis not present

## 2017-09-04 DIAGNOSIS — H401123 Primary open-angle glaucoma, left eye, severe stage: Secondary | ICD-10-CM | POA: Diagnosis not present

## 2017-09-04 DIAGNOSIS — H401114 Primary open-angle glaucoma, right eye, indeterminate stage: Secondary | ICD-10-CM | POA: Diagnosis not present

## 2017-09-04 DIAGNOSIS — H40043 Steroid responder, bilateral: Secondary | ICD-10-CM | POA: Diagnosis not present

## 2017-09-04 DIAGNOSIS — H1013 Acute atopic conjunctivitis, bilateral: Secondary | ICD-10-CM | POA: Diagnosis not present

## 2017-09-06 DIAGNOSIS — F419 Anxiety disorder, unspecified: Secondary | ICD-10-CM | POA: Diagnosis not present

## 2017-09-08 DIAGNOSIS — R262 Difficulty in walking, not elsewhere classified: Secondary | ICD-10-CM | POA: Diagnosis not present

## 2017-09-08 DIAGNOSIS — E1051 Type 1 diabetes mellitus with diabetic peripheral angiopathy without gangrene: Secondary | ICD-10-CM | POA: Diagnosis not present

## 2017-09-08 DIAGNOSIS — L603 Nail dystrophy: Secondary | ICD-10-CM | POA: Diagnosis not present

## 2017-09-12 DIAGNOSIS — F429 Obsessive-compulsive disorder, unspecified: Secondary | ICD-10-CM | POA: Diagnosis not present

## 2017-09-12 DIAGNOSIS — G4700 Insomnia, unspecified: Secondary | ICD-10-CM | POA: Diagnosis not present

## 2017-09-12 DIAGNOSIS — F330 Major depressive disorder, recurrent, mild: Secondary | ICD-10-CM | POA: Diagnosis not present

## 2017-09-12 DIAGNOSIS — F419 Anxiety disorder, unspecified: Secondary | ICD-10-CM | POA: Diagnosis not present

## 2017-09-12 DIAGNOSIS — F0280 Dementia in other diseases classified elsewhere without behavioral disturbance: Secondary | ICD-10-CM | POA: Diagnosis not present

## 2017-09-12 DIAGNOSIS — G301 Alzheimer's disease with late onset: Secondary | ICD-10-CM | POA: Diagnosis not present

## 2017-09-13 ENCOUNTER — Ambulatory Visit (INDEPENDENT_AMBULATORY_CARE_PROVIDER_SITE_OTHER): Payer: Medicare Other | Admitting: Allergy and Immunology

## 2017-09-13 ENCOUNTER — Encounter: Payer: Self-pay | Admitting: Allergy and Immunology

## 2017-09-13 VITALS — BP 106/60 | HR 104 | Resp 18

## 2017-09-13 DIAGNOSIS — G301 Alzheimer's disease with late onset: Secondary | ICD-10-CM | POA: Diagnosis not present

## 2017-09-13 DIAGNOSIS — J3089 Other allergic rhinitis: Secondary | ICD-10-CM

## 2017-09-13 DIAGNOSIS — G4700 Insomnia, unspecified: Secondary | ICD-10-CM | POA: Diagnosis not present

## 2017-09-13 DIAGNOSIS — F429 Obsessive-compulsive disorder, unspecified: Secondary | ICD-10-CM | POA: Diagnosis not present

## 2017-09-13 DIAGNOSIS — L299 Pruritus, unspecified: Secondary | ICD-10-CM | POA: Diagnosis not present

## 2017-09-13 DIAGNOSIS — F330 Major depressive disorder, recurrent, mild: Secondary | ICD-10-CM | POA: Diagnosis not present

## 2017-09-13 DIAGNOSIS — F0280 Dementia in other diseases classified elsewhere without behavioral disturbance: Secondary | ICD-10-CM | POA: Diagnosis not present

## 2017-09-13 DIAGNOSIS — F419 Anxiety disorder, unspecified: Secondary | ICD-10-CM | POA: Diagnosis not present

## 2017-09-13 MED ORDER — TACROLIMUS 0.1 % EX OINT: TOPICAL_OINTMENT | Freq: Every day | CUTANEOUS | 1 refills | Status: DC | PRN

## 2017-09-13 NOTE — Patient Instructions (Addendum)
  1. Use a combination of the following medications every day:   A. Cetirizine 10mg  one tablet two times per day  B. Ranitidine 150 one tablet two times per day   C. loratadine 10 mg one tablet 2 times per day  D. Flonase - one spray each nostril two times per day  2. Continue moisturization of skin after bath / shower  3. Can use Protopic 0.1% ointment to irritated skin areas one time per day until resolved  4.  Acquire blood test requested from last visit  5. Refer to Dermatology for Pruritic disorder.  6. Further evaluation and treatment?

## 2017-09-13 NOTE — Progress Notes (Signed)
Follow-up Note  Referring Provider: No ref. provider found Primary Provider: Seward Carol, MD Date of Office Visit: 09/13/2017  Subjective:   Joanna Hill (DOB: August 23, 1931) is a 84 y.o. female who [facility]2017 in re-evaluation of the following:  HPI: Joanna Hill returns to this clinic in reevaluation of her pruritic disorder and rhinitis.  Nothing that we have done in the past several months has helped Joanna Hill regarding her persistent itchiness and burning of her skin.  We have made an attempt to use anti-inflammatory agents to address an inflammatory component of her skin condition and have given her various forms of antihistamines and tried to manipulate her medical therapy as much as possible looking for a culprit contributing to this issue.  In addition, I have asked her to obtain some blood tests in investigation of eosinophilia, possible celiac disease, and possible aero allergen atopic disease but unfortunately those blood tests were not completed.  She still continues to remain with burning and itching across her skin.  She also states that her tongue is somewhat irritated which has also been a long-standing issue.  Her nose is actually doing relatively well at this point in time on her current therapy.  Allergies as of 09/13/2017   No Known Allergies     Medication List      acetaminophen 325 MG tablet Commonly known as:  TYLENOL Take 650 mg by mouth every 6 (six) hours as needed for mild pain, moderate pain, fever or headache.   antiseptic oral rinse Liqd 5 mLs by Mouth Rinse route 2 (two) times daily.   CARDIZEM CD 240 MG 24 hr capsule Generic drug:  diltiazem Take 240 mg by mouth daily with breakfast.   cetirizine 10 MG tablet Commonly known as:  ZYRTEC Take 1 tablet (10 mg total) by mouth 2 (two) times daily.   cyproheptadine 4 MG tablet Commonly known as:  PERIACTIN Take one tablet twice daily   diphenhydrAMINE 25 mg  capsule Commonly known as:  BENADRYL Take 25 mg by mouth every 6 (six) hours as needed for itching.   docusate 50 MG/5ML liquid Commonly known as:  COLACE Take 100 mg by mouth 2 (two) times daily.   donepezil 10 MG tablet Commonly known as:  ARICEPT Take 10 mg by mouth at bedtime.   fluticasone 50 MCG/ACT nasal spray Commonly known as:  FLONASE Place 1 spray into both nostrils 2 (two) times daily.   furosemide 40 MG tablet Commonly known as:  LASIX Take 1 tablet (40 mg total) daily by mouth.   gabapentin 300 MG capsule Commonly known as:  NEURONTIN Take 300 mg by mouth 2 (two) times daily.   guaiFENesin 600 MG 12 hr tablet Commonly known as:  MUCINEX Take 600 mg by mouth 2 (two) times daily.   hydroxypropyl methylcellulose / hypromellose 2.5 % ophthalmic solution Commonly known as:  ISOPTO TEARS / GONIOVISC Place 1 drop into both eyes 4 (four) times daily.   insulin aspart 100 UNIT/ML injection Commonly known as:  novoLOG Inject 2-11 Units into the skin 4 (four) times daily. BL. MOD. ACCUCHECK WITH S/S USE NOVOLOG INSULIN  101-150 = 2U 151-200= 3U 201-250 = 5U 251-300 = 7U 301-350 = 9U >350 = 11U CALL MD FOR BS.400 OR < 60 Taken at 6am 11:30 am 14:30 and 21:00   ketotifen 0.025 % ophthalmic solution Commonly known as:  ZADITOR Place 1 drop into both eyes 2 (two) times daily.  levalbuterol 0.63 MG/3ML nebulizer solution Commonly known as:  XOPENEX Take 0.63 mg every 6 (six) hours as needed by nebulization for wheezing or shortness of breath.   LEVEMIR FLEXTOUCH 100 UNIT/ML Pen Generic drug:  Insulin Detemir Inject 24 Units into the skin daily at 10 pm.   levothyroxine 100 MCG tablet Commonly known as:  SYNTHROID, LEVOTHROID Take 100 mcg by mouth daily before breakfast.   LUVOX CR 150 MG Cp24 Generic drug:  Fluvoxamine Maleate Take 150 mg by mouth at bedtime.   magic mouthwash Soln Take 5 mLs by mouth 2 (two) times daily.   mirtazapine 15 MG  tablet Commonly known as:  REMERON Take 15 mg by mouth at bedtime.   nitroGLYCERIN 0.4 MG SL tablet Commonly known as:  NITROSTAT Place 0.4 mg every 5 (five) minutes as needed under the tongue for chest pain.   OCUSOFT LID SCRUB Pads Apply 1 each topically 2 (two) times daily.   pantoprazole 40 MG tablet Commonly known as:  PROTONIX Take 40 mg by mouth daily with breakfast.   polyethylene glycol packet Commonly known as:  MIRALAX / GLYCOLAX Take 17 g by mouth daily as needed.   potassium chloride SA 20 MEQ tablet Commonly known as:  K-DUR,KLOR-CON Take 20 mEq by mouth daily with breakfast.   ranitidine 150 MG tablet Commonly known as:  ZANTAC Take 150 mg by mouth 2 (two) times daily.   REFRESH CONTACTS DROPS Soln Place 1 drop into both eyes 4 (four) times daily.   Rivaroxaban 15 MG Tabs tablet Commonly known as:  XARELTO Take 15 mg by mouth daily with supper.   Saline 0.65 % (Soln) Soln Place 1 spray into both nostrils every 2 (two) hours as needed (for dry nares).   sucralfate 1 g tablet Commonly known as:  CARAFATE Take 1 g by mouth 4 (four) times daily.   SYSTANE 0.4-0.3 % Gel ophthalmic gel Generic drug:  Polyethyl Glycol-Propyl Glycol Place 1 application into both eyes at bedtime.   tacrolimus 0.1 % ointment Commonly known as:  PROTOPIC Apply topically daily as needed.   traZODone 50 MG tablet Commonly known as:  DESYREL Take 25 mg at bedtime by mouth.   triamcinolone cream 0.5 % Commonly known as:  KENALOG Apply 1 application topically 2 (two) times daily. Apply to legs for itching/rash   ULTRAM 50 MG tablet Generic drug:  traMADol Take 50 mg every 8 (eight) hours as needed by mouth for moderate pain.   VASELINE Gel Generic drug:  white petrolatum Apply 1 application topically at bedtime.   VOLTAREN 1 % Gel Generic drug:  diclofenac sodium Apply 2 g topically as needed (for right hip and thigh pain). May use gel to right hip and thigh as needed  for pain.       Past Medical History:  Diagnosis Date  . Atrial fibrillation with RVR ([place]) 02/28/2017  . Blindness   . Cancer ([place])    breast  . Carotid artery occlusion   . Dementia   . Diabetes mellitus   . GERD (gastroesophageal reflux disease)   . Hyperlipidemia   . Hypertension   . Hypothyroidism   . Knee injury 2018  . Stroke [facility]) 2009 and  2011   difficulty with swallowing    Past Surgical History:  Procedure Laterality Date  . ABDOMINAL HYSTERECTOMY    . Arch Aortogram  03-15-2010  . MASTECTOMY, PARTIAL Right   . ROTATOR CUFF REPAIR Left 1981    Review of systems  negative except as noted in HPI / PMHx or noted below:  Review of Systems  Constitutional: Negative.   HENT: Negative.   Eyes: Negative.   Respiratory: Negative.   Cardiovascular: Negative.   Gastrointestinal: Negative.   Genitourinary: Negative.   Musculoskeletal: Negative.   Skin: Negative.   Neurological: Negative.   Endo/Heme/Allergies: Negative.   Psychiatric/Behavioral: Negative.      Objective:   Vitals:   09/13/17 1040  BP: 106/60  Pulse: (!) 104  Resp: 18  SpO2: 98%          Physical Exam  Skin: No rash (No obvious signs of inflammatory dermatitis identified.) noted.    Diagnostics: none   Assessment and Plan:   1. Pruritic disorder   2. Other allergic rhinitis     1. Use a combination of the following medications every day:   A. Cetirizine 10mg  one tablet two times per day  B. Ranitidine 150 one tablet two times per day   C. loratadine 10 mg one tablet 2 times per day  D. Flonase - one spray each nostril two times per day  2. Continue moisturization of skin after bath / shower  3. Can use Protopic 0.1% ointment to irritated skin areas one time per day until resolved  4.  Acquire blood test requested from last visit  5. Refer to Dermatology for Pruritic disorder.  6. Further evaluation and treatment?   I am going to refer Joanna Hill to a dermatologist to  see if some source of her pruritic disorder can be identified and also to hopefully provide some form of therapy that can give her relief.  I will review the results of her blood tests once they are available for review and she can continue on her medical plan as noted above.  Allena Katz, MD Allergy / Immunology [place]

## 2017-09-14 ENCOUNTER — Telehealth: Payer: Self-pay | Admitting: Allergy and Immunology

## 2017-09-14 ENCOUNTER — Encounter: Payer: Self-pay | Admitting: Allergy and Immunology

## 2017-09-14 NOTE — Telephone Encounter (Signed)
Created and faxed referral and notes to [facility] They will contact patient to schedule an appt

## 2017-09-14 NOTE — Telephone Encounter (Signed)
-----   Message from [place], [place] sent at 09/13/2017 11:17 AM EST ----- Regarding: Referral.  Please refer pt to dermatology for pruritic disorder.

## 2017-09-15 DIAGNOSIS — R1312 Dysphagia, oropharyngeal phase: Secondary | ICD-10-CM | POA: Diagnosis not present

## 2017-09-15 DIAGNOSIS — R293 Abnormal posture: Secondary | ICD-10-CM | POA: Diagnosis not present

## 2017-09-15 DIAGNOSIS — R1311 Dysphagia, oral phase: Secondary | ICD-10-CM | POA: Diagnosis not present

## 2017-09-15 DIAGNOSIS — J1089 Influenza due to other identified influenza virus with other manifestations: Secondary | ICD-10-CM | POA: Diagnosis not present

## 2017-09-15 DIAGNOSIS — M6281 Muscle weakness (generalized): Secondary | ICD-10-CM | POA: Diagnosis not present

## 2017-09-15 DIAGNOSIS — I10 Essential (primary) hypertension: Secondary | ICD-10-CM | POA: Diagnosis not present

## 2017-09-15 DIAGNOSIS — R278 Other lack of coordination: Secondary | ICD-10-CM | POA: Diagnosis not present

## 2017-09-15 DIAGNOSIS — Z5181 Encounter for therapeutic drug level monitoring: Secondary | ICD-10-CM | POA: Diagnosis not present

## 2017-09-15 DIAGNOSIS — R296 Repeated falls: Secondary | ICD-10-CM | POA: Diagnosis not present

## 2017-09-15 DIAGNOSIS — J45909 Unspecified asthma, uncomplicated: Secondary | ICD-10-CM | POA: Diagnosis not present

## 2017-09-18 DIAGNOSIS — D649 Anemia, unspecified: Secondary | ICD-10-CM | POA: Diagnosis not present

## 2017-09-18 DIAGNOSIS — E039 Hypothyroidism, unspecified: Secondary | ICD-10-CM | POA: Diagnosis not present

## 2017-09-18 DIAGNOSIS — R278 Other lack of coordination: Secondary | ICD-10-CM | POA: Diagnosis not present

## 2017-09-18 DIAGNOSIS — R293 Abnormal posture: Secondary | ICD-10-CM | POA: Diagnosis not present

## 2017-09-18 DIAGNOSIS — Z5181 Encounter for therapeutic drug level monitoring: Secondary | ICD-10-CM | POA: Diagnosis not present

## 2017-09-18 DIAGNOSIS — E119 Type 2 diabetes mellitus without complications: Secondary | ICD-10-CM | POA: Diagnosis not present

## 2017-09-18 DIAGNOSIS — R296 Repeated falls: Secondary | ICD-10-CM | POA: Diagnosis not present

## 2017-09-18 DIAGNOSIS — J1089 Influenza due to other identified influenza virus with other manifestations: Secondary | ICD-10-CM | POA: Diagnosis not present

## 2017-09-18 DIAGNOSIS — E785 Hyperlipidemia, unspecified: Secondary | ICD-10-CM | POA: Diagnosis not present

## 2017-09-18 DIAGNOSIS — R1311 Dysphagia, oral phase: Secondary | ICD-10-CM | POA: Diagnosis not present

## 2017-09-18 DIAGNOSIS — E559 Vitamin D deficiency, unspecified: Secondary | ICD-10-CM | POA: Diagnosis not present

## 2017-09-18 LAB — ALLERGENS W/TOTAL IGE AREA 2
Cat Dander IgE: <0.1 kU/L
Cladosporium Herbarum IgE: <0.1 kU/L
Dog Dander IgE: <0.1 kU/L
Elm, American IgE: <0.1 kU/L
IgE (Immunoglobulin E), Serum: 8 IU/mL (ref 0–100)
Johnson Grass IgE: <0.1 kU/L
Pecan, Hickory IgE: <0.1 kU/L
Penicillium Chrysogen IgE: <0.1 kU/L
Timothy Grass IgE: <0.1 kU/L
White Mulberry IgE: <0.1 kU/L

## 2017-09-18 LAB — CBC WITH DIFFERENTIAL/PLATELET
BASOS ABS: 0 10*3/uL (ref 0.0–0.2)
Basos: 0 %
EOS (ABSOLUTE): 0.2 10*3/uL (ref 0.0–0.4)
EOS: 3 %
HEMATOCRIT: 37.4 % (ref 34.0–46.6)
HEMOGLOBIN: 12.1 g/dL (ref 11.1–15.9)
IMMATURE GRANULOCYTES: 0 %
Immature Grans (Abs): 0 10*3/uL (ref 0.0–0.1)
LYMPHS: 12 %
Lymphocytes Absolute: 0.8 10*3/uL (ref 0.7–3.1)
MCH: 26.1 pg — ABNORMAL LOW (ref 26.6–33.0)
MCHC: 32.4 g/dL (ref 31.5–35.7)
MCV: 81 fL (ref 79–97)
MONOCYTES: 6 %
MONOS ABS: 0.4 10*3/uL (ref 0.1–0.9)
Neutrophils Absolute: 5.3 10*3/uL (ref 1.4–7.0)
Neutrophils: 79 %
Platelets: 257 10*3/uL (ref 150–379)
RBC: 4.63 x10E6/uL (ref 3.77–5.28)
RDW: 16.8 % — AB (ref 12.3–15.4)
WBC: 6.7 10*3/uL (ref 3.4–10.8)

## 2017-09-18 LAB — CELIAC PANEL 10
Antigliadin Abs, IgA: 9 units (ref 0–19)
Endomysial IgA: NEGATIVE
Gliadin IgG: 5 units (ref 0–19)
IGA/IMMUNOGLOBULIN A, SERUM: 356 mg/dL (ref 64–422)
Tissue Transglut Ab: <2 U/mL (ref 0–5)
Transglutaminase IgA: <2 U/mL (ref 0–3)

## 2017-09-19 DIAGNOSIS — R1311 Dysphagia, oral phase: Secondary | ICD-10-CM | POA: Diagnosis not present

## 2017-09-19 DIAGNOSIS — R278 Other lack of coordination: Secondary | ICD-10-CM | POA: Diagnosis not present

## 2017-09-19 DIAGNOSIS — R296 Repeated falls: Secondary | ICD-10-CM | POA: Diagnosis not present

## 2017-09-19 DIAGNOSIS — R293 Abnormal posture: Secondary | ICD-10-CM | POA: Diagnosis not present

## 2017-09-19 DIAGNOSIS — J1089 Influenza due to other identified influenza virus with other manifestations: Secondary | ICD-10-CM | POA: Diagnosis not present

## 2017-09-19 DIAGNOSIS — Z5181 Encounter for therapeutic drug level monitoring: Secondary | ICD-10-CM | POA: Diagnosis not present

## 2017-09-20 DIAGNOSIS — Z5181 Encounter for therapeutic drug level monitoring: Secondary | ICD-10-CM | POA: Diagnosis not present

## 2017-09-20 DIAGNOSIS — R1311 Dysphagia, oral phase: Secondary | ICD-10-CM | POA: Diagnosis not present

## 2017-09-20 DIAGNOSIS — R293 Abnormal posture: Secondary | ICD-10-CM | POA: Diagnosis not present

## 2017-09-20 DIAGNOSIS — R278 Other lack of coordination: Secondary | ICD-10-CM | POA: Diagnosis not present

## 2017-09-20 DIAGNOSIS — J1089 Influenza due to other identified influenza virus with other manifestations: Secondary | ICD-10-CM | POA: Diagnosis not present

## 2017-09-20 DIAGNOSIS — F419 Anxiety disorder, unspecified: Secondary | ICD-10-CM | POA: Diagnosis not present

## 2017-09-20 DIAGNOSIS — R296 Repeated falls: Secondary | ICD-10-CM | POA: Diagnosis not present

## 2017-09-21 DIAGNOSIS — I10 Essential (primary) hypertension: Secondary | ICD-10-CM | POA: Diagnosis not present

## 2017-09-21 DIAGNOSIS — I4891 Unspecified atrial fibrillation: Secondary | ICD-10-CM | POA: Diagnosis not present

## 2017-09-21 DIAGNOSIS — I509 Heart failure, unspecified: Secondary | ICD-10-CM | POA: Diagnosis not present

## 2017-09-21 DIAGNOSIS — J1089 Influenza due to other identified influenza virus with other manifestations: Secondary | ICD-10-CM | POA: Diagnosis not present

## 2017-09-21 DIAGNOSIS — R278 Other lack of coordination: Secondary | ICD-10-CM | POA: Diagnosis not present

## 2017-09-21 DIAGNOSIS — R1311 Dysphagia, oral phase: Secondary | ICD-10-CM | POA: Diagnosis not present

## 2017-09-21 DIAGNOSIS — R296 Repeated falls: Secondary | ICD-10-CM | POA: Diagnosis not present

## 2017-09-21 DIAGNOSIS — R293 Abnormal posture: Secondary | ICD-10-CM | POA: Diagnosis not present

## 2017-09-21 DIAGNOSIS — R634 Abnormal weight loss: Secondary | ICD-10-CM | POA: Diagnosis not present

## 2017-09-21 DIAGNOSIS — Z5181 Encounter for therapeutic drug level monitoring: Secondary | ICD-10-CM | POA: Diagnosis not present

## 2017-09-21 DIAGNOSIS — R05 Cough: Secondary | ICD-10-CM | POA: Diagnosis not present

## 2017-09-22 DIAGNOSIS — J1089 Influenza due to other identified influenza virus with other manifestations: Secondary | ICD-10-CM | POA: Diagnosis not present

## 2017-09-22 DIAGNOSIS — R296 Repeated falls: Secondary | ICD-10-CM | POA: Diagnosis not present

## 2017-09-22 DIAGNOSIS — Z5181 Encounter for therapeutic drug level monitoring: Secondary | ICD-10-CM | POA: Diagnosis not present

## 2017-09-22 DIAGNOSIS — R1311 Dysphagia, oral phase: Secondary | ICD-10-CM | POA: Diagnosis not present

## 2017-09-22 DIAGNOSIS — R278 Other lack of coordination: Secondary | ICD-10-CM | POA: Diagnosis not present

## 2017-09-22 DIAGNOSIS — R293 Abnormal posture: Secondary | ICD-10-CM | POA: Diagnosis not present

## 2017-09-25 DIAGNOSIS — R296 Repeated falls: Secondary | ICD-10-CM | POA: Diagnosis not present

## 2017-09-25 DIAGNOSIS — J1089 Influenza due to other identified influenza virus with other manifestations: Secondary | ICD-10-CM | POA: Diagnosis not present

## 2017-09-25 DIAGNOSIS — R278 Other lack of coordination: Secondary | ICD-10-CM | POA: Diagnosis not present

## 2017-09-25 DIAGNOSIS — Z5181 Encounter for therapeutic drug level monitoring: Secondary | ICD-10-CM | POA: Diagnosis not present

## 2017-09-25 DIAGNOSIS — R1311 Dysphagia, oral phase: Secondary | ICD-10-CM | POA: Diagnosis not present

## 2017-09-25 DIAGNOSIS — R293 Abnormal posture: Secondary | ICD-10-CM | POA: Diagnosis not present

## 2017-09-26 DIAGNOSIS — W19XXXD Unspecified fall, subsequent encounter: Secondary | ICD-10-CM | POA: Diagnosis not present

## 2017-09-26 DIAGNOSIS — Z5181 Encounter for therapeutic drug level monitoring: Secondary | ICD-10-CM | POA: Diagnosis not present

## 2017-09-26 DIAGNOSIS — I509 Heart failure, unspecified: Secondary | ICD-10-CM | POA: Diagnosis not present

## 2017-09-26 DIAGNOSIS — R600 Localized edema: Secondary | ICD-10-CM | POA: Diagnosis not present

## 2017-09-26 DIAGNOSIS — R293 Abnormal posture: Secondary | ICD-10-CM | POA: Diagnosis not present

## 2017-09-26 DIAGNOSIS — R296 Repeated falls: Secondary | ICD-10-CM | POA: Diagnosis not present

## 2017-09-26 DIAGNOSIS — I4891 Unspecified atrial fibrillation: Secondary | ICD-10-CM | POA: Diagnosis not present

## 2017-09-26 DIAGNOSIS — R1311 Dysphagia, oral phase: Secondary | ICD-10-CM | POA: Diagnosis not present

## 2017-09-26 DIAGNOSIS — M25562 Pain in left knee: Secondary | ICD-10-CM | POA: Diagnosis not present

## 2017-09-26 DIAGNOSIS — J1089 Influenza due to other identified influenza virus with other manifestations: Secondary | ICD-10-CM | POA: Diagnosis not present

## 2017-09-26 DIAGNOSIS — R278 Other lack of coordination: Secondary | ICD-10-CM | POA: Diagnosis not present

## 2017-09-27 DIAGNOSIS — F0280 Dementia in other diseases classified elsewhere without behavioral disturbance: Secondary | ICD-10-CM | POA: Diagnosis not present

## 2017-09-27 DIAGNOSIS — R278 Other lack of coordination: Secondary | ICD-10-CM | POA: Diagnosis not present

## 2017-09-27 DIAGNOSIS — F330 Major depressive disorder, recurrent, mild: Secondary | ICD-10-CM | POA: Diagnosis not present

## 2017-09-27 DIAGNOSIS — R293 Abnormal posture: Secondary | ICD-10-CM | POA: Diagnosis not present

## 2017-09-27 DIAGNOSIS — Z5181 Encounter for therapeutic drug level monitoring: Secondary | ICD-10-CM | POA: Diagnosis not present

## 2017-09-27 DIAGNOSIS — J1089 Influenza due to other identified influenza virus with other manifestations: Secondary | ICD-10-CM | POA: Diagnosis not present

## 2017-09-27 DIAGNOSIS — R1311 Dysphagia, oral phase: Secondary | ICD-10-CM | POA: Diagnosis not present

## 2017-09-27 DIAGNOSIS — R296 Repeated falls: Secondary | ICD-10-CM | POA: Diagnosis not present

## 2017-09-27 DIAGNOSIS — I4891 Unspecified atrial fibrillation: Secondary | ICD-10-CM | POA: Diagnosis not present

## 2017-09-27 DIAGNOSIS — G301 Alzheimer's disease with late onset: Secondary | ICD-10-CM | POA: Diagnosis not present

## 2017-09-27 DIAGNOSIS — E039 Hypothyroidism, unspecified: Secondary | ICD-10-CM | POA: Diagnosis not present

## 2017-09-27 DIAGNOSIS — G4700 Insomnia, unspecified: Secondary | ICD-10-CM | POA: Diagnosis not present

## 2017-09-27 DIAGNOSIS — F0390 Unspecified dementia without behavioral disturbance: Secondary | ICD-10-CM | POA: Diagnosis not present

## 2017-09-27 DIAGNOSIS — I10 Essential (primary) hypertension: Secondary | ICD-10-CM | POA: Diagnosis not present

## 2017-09-28 DIAGNOSIS — R1311 Dysphagia, oral phase: Secondary | ICD-10-CM | POA: Diagnosis not present

## 2017-09-28 DIAGNOSIS — R296 Repeated falls: Secondary | ICD-10-CM | POA: Diagnosis not present

## 2017-09-28 DIAGNOSIS — Z5181 Encounter for therapeutic drug level monitoring: Secondary | ICD-10-CM | POA: Diagnosis not present

## 2017-09-28 DIAGNOSIS — R293 Abnormal posture: Secondary | ICD-10-CM | POA: Diagnosis not present

## 2017-09-28 DIAGNOSIS — R278 Other lack of coordination: Secondary | ICD-10-CM | POA: Diagnosis not present

## 2017-09-28 DIAGNOSIS — J1089 Influenza due to other identified influenza virus with other manifestations: Secondary | ICD-10-CM | POA: Diagnosis not present

## 2017-09-29 DIAGNOSIS — R296 Repeated falls: Secondary | ICD-10-CM | POA: Diagnosis not present

## 2017-09-29 DIAGNOSIS — Z5181 Encounter for therapeutic drug level monitoring: Secondary | ICD-10-CM | POA: Diagnosis not present

## 2017-09-29 DIAGNOSIS — R293 Abnormal posture: Secondary | ICD-10-CM | POA: Diagnosis not present

## 2017-09-29 DIAGNOSIS — J1089 Influenza due to other identified influenza virus with other manifestations: Secondary | ICD-10-CM | POA: Diagnosis not present

## 2017-09-29 DIAGNOSIS — R278 Other lack of coordination: Secondary | ICD-10-CM | POA: Diagnosis not present

## 2017-09-29 DIAGNOSIS — R1311 Dysphagia, oral phase: Secondary | ICD-10-CM | POA: Diagnosis not present

## 2017-10-02 ENCOUNTER — Ambulatory Visit (HOSPITAL_COMMUNITY): Admission: RE | Admit: 2017-10-02 | Payer: Medicare Other | Source: Ambulatory Visit

## 2017-10-02 DIAGNOSIS — Z5181 Encounter for therapeutic drug level monitoring: Secondary | ICD-10-CM | POA: Diagnosis not present

## 2017-10-02 DIAGNOSIS — R278 Other lack of coordination: Secondary | ICD-10-CM | POA: Diagnosis not present

## 2017-10-02 DIAGNOSIS — R296 Repeated falls: Secondary | ICD-10-CM | POA: Diagnosis not present

## 2017-10-02 DIAGNOSIS — J1089 Influenza due to other identified influenza virus with other manifestations: Secondary | ICD-10-CM | POA: Diagnosis not present

## 2017-10-02 DIAGNOSIS — R293 Abnormal posture: Secondary | ICD-10-CM | POA: Diagnosis not present

## 2017-10-02 DIAGNOSIS — R1311 Dysphagia, oral phase: Secondary | ICD-10-CM | POA: Diagnosis not present

## 2017-10-03 DIAGNOSIS — R293 Abnormal posture: Secondary | ICD-10-CM | POA: Diagnosis not present

## 2017-10-03 DIAGNOSIS — J1089 Influenza due to other identified influenza virus with other manifestations: Secondary | ICD-10-CM | POA: Diagnosis not present

## 2017-10-03 DIAGNOSIS — Z5181 Encounter for therapeutic drug level monitoring: Secondary | ICD-10-CM | POA: Diagnosis not present

## 2017-10-03 DIAGNOSIS — R1311 Dysphagia, oral phase: Secondary | ICD-10-CM | POA: Diagnosis not present

## 2017-10-03 DIAGNOSIS — R278 Other lack of coordination: Secondary | ICD-10-CM | POA: Diagnosis not present

## 2017-10-03 DIAGNOSIS — R296 Repeated falls: Secondary | ICD-10-CM | POA: Diagnosis not present

## 2017-10-04 DIAGNOSIS — R296 Repeated falls: Secondary | ICD-10-CM | POA: Diagnosis not present

## 2017-10-04 DIAGNOSIS — R278 Other lack of coordination: Secondary | ICD-10-CM | POA: Diagnosis not present

## 2017-10-04 DIAGNOSIS — R1311 Dysphagia, oral phase: Secondary | ICD-10-CM | POA: Diagnosis not present

## 2017-10-04 DIAGNOSIS — R293 Abnormal posture: Secondary | ICD-10-CM | POA: Diagnosis not present

## 2017-10-04 DIAGNOSIS — J1089 Influenza due to other identified influenza virus with other manifestations: Secondary | ICD-10-CM | POA: Diagnosis not present

## 2017-10-04 DIAGNOSIS — L299 Pruritus, unspecified: Secondary | ICD-10-CM | POA: Diagnosis not present

## 2017-10-04 DIAGNOSIS — Z5181 Encounter for therapeutic drug level monitoring: Secondary | ICD-10-CM | POA: Diagnosis not present

## 2017-10-05 DIAGNOSIS — Z5181 Encounter for therapeutic drug level monitoring: Secondary | ICD-10-CM | POA: Diagnosis not present

## 2017-10-05 DIAGNOSIS — R1311 Dysphagia, oral phase: Secondary | ICD-10-CM | POA: Diagnosis not present

## 2017-10-05 DIAGNOSIS — R293 Abnormal posture: Secondary | ICD-10-CM | POA: Diagnosis not present

## 2017-10-05 DIAGNOSIS — R278 Other lack of coordination: Secondary | ICD-10-CM | POA: Diagnosis not present

## 2017-10-05 DIAGNOSIS — J1089 Influenza due to other identified influenza virus with other manifestations: Secondary | ICD-10-CM | POA: Diagnosis not present

## 2017-10-05 DIAGNOSIS — R296 Repeated falls: Secondary | ICD-10-CM | POA: Diagnosis not present

## 2017-10-06 DIAGNOSIS — R293 Abnormal posture: Secondary | ICD-10-CM | POA: Diagnosis not present

## 2017-10-06 DIAGNOSIS — Q845 Enlarged and hypertrophic nails: Secondary | ICD-10-CM | POA: Diagnosis not present

## 2017-10-06 DIAGNOSIS — R1311 Dysphagia, oral phase: Secondary | ICD-10-CM | POA: Diagnosis not present

## 2017-10-06 DIAGNOSIS — L603 Nail dystrophy: Secondary | ICD-10-CM | POA: Diagnosis not present

## 2017-10-06 DIAGNOSIS — Z5181 Encounter for therapeutic drug level monitoring: Secondary | ICD-10-CM | POA: Diagnosis not present

## 2017-10-06 DIAGNOSIS — R296 Repeated falls: Secondary | ICD-10-CM | POA: Diagnosis not present

## 2017-10-06 DIAGNOSIS — J1089 Influenza due to other identified influenza virus with other manifestations: Secondary | ICD-10-CM | POA: Diagnosis not present

## 2017-10-06 DIAGNOSIS — R278 Other lack of coordination: Secondary | ICD-10-CM | POA: Diagnosis not present

## 2017-10-06 DIAGNOSIS — I739 Peripheral vascular disease, unspecified: Secondary | ICD-10-CM | POA: Diagnosis not present

## 2017-10-07 NOTE — Progress Notes (Deleted)
Cardiology Office Note   Date:  10/07/2017   ID:  HOWARD PATTON, DOB 1932/01/26, MRN [id number]  PCP:    Seward Carol, MD Cardiologist:   Minus Breeding, MD   No chief complaint on file.     History of Present Illness: Joanna Hill is a 84 y.o. female who presents for follow up of atrial fibrillation.  At my last visit with her I had to increased her diuretic for increased leg edema.  ***    (Xarelto).  She had this when she was admitted with abdominal pain.  She had an elevated troponin.   This was thought to be demand ischemia.  She presents for follow-up.  Her weights have been going up and she is up about 14 pounds.  She gets around completely in a wheelchair pulling herself with her feet.  She has not noticed any increased PND or orthopnea or overt shortness of breath but she does not know that her legs are more swollen and her belly is swollen.  She apparently has a skin tear on the left leg which is bandaged and she said she had some weeping from there.  He has a cough productive of some white sputum.  He does not notice palpitations and has had no presyncope or syncope.  She has had no chest pressure, neck or arm discomfort.  She has had no fevers or chills.  She does have a coarse voice.  She says her sputum is not overly salted.   Past Medical History:  Diagnosis Date  . Atrial fibrillation with RVR ([place]) 02/28/2017  . Blindness   . Cancer ([place])    breast  . Carotid artery occlusion   . Dementia   . Diabetes mellitus   . GERD (gastroesophageal reflux disease)   . Hyperlipidemia   . Hypertension   . Hypothyroidism   . Knee injury 2018  . Stroke [facility]) 2009 and  2011   difficulty with swallowing    Past Surgical History:  Procedure Laterality Date  . ABDOMINAL HYSTERECTOMY    . Arch Aortogram  03-15-2010  . MASTECTOMY, PARTIAL Right   . ROTATOR CUFF REPAIR Left 1981     Current Outpatient Medications  Medication Sig Dispense Refill  . acetaminophen  (TYLENOL) 325 MG tablet Take 650 mg by mouth every 6 (six) hours as needed for mild pain, moderate pain, fever or headache.     Marland Kitchen antiseptic oral rinse (BIOTENE) LIQD 5 mLs by Mouth Rinse route 2 (two) times daily.    . cetirizine (ZYRTEC) 10 MG tablet Take 1 tablet (10 mg total) by mouth 2 (two) times daily. (Patient taking differently: Take 10 mg by mouth daily with breakfast. ) 60 tablet 5  . cyproheptadine (PERIACTIN) 4 MG tablet Take one tablet twice daily 60 tablet 5  . diclofenac sodium (VOLTAREN) 1 % GEL Apply 2 g topically as needed (for right hip and thigh pain). May use gel to right hip and thigh as needed for pain.     Marland Kitchen diltiazem (CARDIZEM CD) 240 MG 24 hr capsule Take 240 mg by mouth daily with breakfast.  30 capsule 6  . diphenhydrAMINE (BENADRYL) 25 mg capsule Take 25 mg by mouth every 6 (six) hours as needed for itching.     . docusate (COLACE) 50 MG/5ML liquid Take 100 mg by mouth 2 (two) times daily.    Marland Kitchen donepezil (ARICEPT) 10 MG tablet Take 10 mg by mouth at bedtime.    Marland Kitchen  Eyelid Cleansers (OCUSOFT LID SCRUB) PADS Apply 1 each topically 2 (two) times daily.    . fluticasone (FLONASE) 50 MCG/ACT nasal spray Place 1 spray into both nostrils 2 (two) times daily.     . Fluvoxamine Maleate (LUVOX CR) 150 MG CP24 Take 150 mg by mouth at bedtime.    . furosemide (LASIX) 40 MG tablet Take 1 tablet (40 mg total) daily by mouth. (Patient taking differently: Take 40 mg by mouth daily with breakfast. ) 90 tablet 3  . gabapentin (NEURONTIN) 300 MG capsule Take 300 mg by mouth 2 (two) times daily.    Marland Kitchen guaiFENesin (MUCINEX) 600 MG 12 hr tablet Take 600 mg by mouth 2 (two) times daily.    . hydroxypropyl methylcellulose / hypromellose (ISOPTO TEARS / GONIOVISC) 2.5 % ophthalmic solution Place 1 drop into both eyes 4 (four) times daily.    . insulin aspart (NOVOLOG) 100 UNIT/ML injection Inject 2-11 Units into the skin 4 (four) times daily. BL. MOD. ACCUCHECK WITH S/S USE NOVOLOG  INSULIN  101-150 = 2U 151-200= 3U 201-250 = 5U 251-300 = 7U 301-350 = 9U >350 = 11U CALL MD FOR BS.400 OR < 60 Taken at 6am 11:30 am 14:30 and 21:00    . ketotifen (ZADITOR) 0.025 % ophthalmic solution Place 1 drop into both eyes 2 (two) times daily.     Marland Kitchen levalbuterol (XOPENEX) 0.63 MG/3ML nebulizer solution Take 0.63 mg every 6 (six) hours as needed by nebulization for wheezing or shortness of breath.     Marland Kitchen LEVEMIR FLEXTOUCH 100 UNIT/ML Pen Inject 24 Units into the skin daily at 10 pm. 15 mL 0  . levothyroxine (SYNTHROID, LEVOTHROID) 100 MCG tablet Take 100 mcg by mouth daily before breakfast.    . magic mouthwash SOLN Take 5 mLs by mouth 2 (two) times daily.    . mirtazapine (REMERON) 15 MG tablet Take 15 mg by mouth at bedtime.    . nitroGLYCERIN (NITROSTAT) 0.4 MG SL tablet Place 0.4 mg every 5 (five) minutes as needed under the tongue for chest pain.     . pantoprazole (PROTONIX) 40 MG tablet Take 40 mg by mouth daily with breakfast.     . Polyethyl Glycol-Propyl Glycol (SYSTANE) 0.4-0.3 % GEL ophthalmic gel Place 1 application into both eyes at bedtime.    . polyethylene glycol (MIRALAX / GLYCOLAX) packet Take 17 g by mouth daily as needed. (Patient taking differently: Take 17 g by mouth daily as needed for mild constipation. ) 14 each 0  . potassium chloride SA (K-DUR,KLOR-CON) 20 MEQ tablet Take 20 mEq by mouth daily with breakfast.     . ranitidine (ZANTAC) 150 MG tablet Take 150 mg by mouth 2 (two) times daily.    . Rivaroxaban (XARELTO) 15 MG TABS tablet Take 15 mg by mouth daily with supper.    . Saline 0.65 % (Soln) SOLN Place 1 spray into both nostrils every 2 (two) hours as needed (for dry nares).     . Soft Lens Products (REFRESH CONTACTS DROPS) SOLN Place 1 drop into both eyes 4 (four) times daily.    . sucralfate (CARAFATE) 1 g tablet Take 1 g by mouth 4 (four) times daily.     . tacrolimus (PROTOPIC) 0.1 % ointment Apply topically daily as needed. 100 g 1  . traMADol  (ULTRAM) 50 MG tablet Take 50 mg every 8 (eight) hours as needed by mouth for moderate pain.     . traZODone (DESYREL) 50 MG tablet Take 25  mg at bedtime by mouth.     . triamcinolone cream (KENALOG) 0.5 % Apply 1 application topically 2 (two) times daily. Apply to legs for itching/rash    . white petrolatum (VASELINE) GEL Apply 1 application topically at bedtime.     No current facility-administered medications for this visit.     Allergies:   Patient has no known allergies.    ROS:  Please see the history of present illness.   Otherwise, review of systems are positive for ***.   All other systems are reviewed and negative.    PHYSICAL EXAM: VS:  There were no vitals taken for this visit. , BMI There is no height or weight on file to calculate BMI.  GENERAL:  Well appearing NECK:  No jugular venous distention, waveform within normal limits, carotid upstroke brisk and symmetric, no bruits, no thyromegaly LUNGS:  Clear to auscultation bilaterally CHEST:  Unremarkable HEART:  PMI not displaced or sustained,S1 and S2 within normal limits, no S3, no S4, no clicks, no rubs, *** murmurs ABD:  Flat, positive bowel sounds normal in frequency in pitch, no bruits, no rebound, no guarding, no midline pulsatile mass, no hepatomegaly, no splenomegaly EXT:  2 plus pulses throughout, no edema, no cyanosis no clubbing   GEN:  No distress NECK:  Positive jugular venous distention at 90 degrees, waveform within normal limits, carotid upstroke brisk and symmetric, no bruits, no thyromegaly LUNGS:  Clear to auscultation bilaterally BACK:  No CVA tenderness CHEST:  Right mastectomy HEART:  S1 and S2 within normal limits, no S3, no clicks, no rubs, no murmurs, irregular ABD:  Positive bowel sounds normal in frequency in pitch, no bruits, no rebound, no guarding, unable to assess midline mass or bruit with the patient seated. EXT:  2 plus pulses throughout, moderate left greater than right leg edema, no  cyanosis no clubbing, right arm swelling.    EKG:  EKG *** ordered today. ***   Recent Labs: 02/28/2017: ALT 17; B Natriuretic Peptide 109.6; Magnesium 2.1 03/01/2017: TSH 14.859 06/30/2017: BUN 18; Creatinine, Ser 0.97; Potassium 4.4; Sodium 147 09/13/2017: Hemoglobin 12.1; Platelets 257    Lipid Panel No results found for: CHOL, TRIG, HDL, CHOLHDL, VLDL, LDLCALC, LDLDIRECT    Wt Readings from Last 3 Encounters:  08/11/17 144 lb (65.3 kg)  06/30/17 159 lb (72.1 kg)  06/23/17 164 lb 12.8 oz (74.8 kg)      Other studies Reviewed: Additional studies/ records that were reviewed today include: *** Review of the above records demonstrates:  ***    ASSESSMENT AND PLAN:  PERSISTENT ATRIAL FIB:   Ms. Joanna Hill has a CHA2DS2 - VASc score of 7.  ***   She tolerates this rhythm and atrial fib.  No change in therapy is indicated.   Carotid stenosis:  She had 40-59 percent of the right internal carotid and less than 40% stenosis of the left internal carotid and 2015. ***  She can have follow up scheduled when she returns.   Edema:   ***   I am going to increase Lasix to 40 mg bid X 4 days and increase the potassium as well to 40 meq daily both changes for three days and then 40 mg Lasix daily and reduce back to 20 meq potassium daily and check a BMET next week and schedule to see one of our APPs.   Current medicines are reviewed at length with the patient today.  The patient does not have concerns regarding medicines.  The following changes have been made:  ***  Labs/ tests ordered today include:  ***  No orders of the defined types were placed in this encounter.    Disposition:   FU with ***     Signed, Minus Breeding, MD  10/07/2017 9:19 PM    [facility]

## 2017-10-09 ENCOUNTER — Ambulatory Visit: Payer: Medicare Other | Admitting: Cardiology

## 2017-10-09 DIAGNOSIS — R278 Other lack of coordination: Secondary | ICD-10-CM | POA: Diagnosis not present

## 2017-10-09 DIAGNOSIS — Z5181 Encounter for therapeutic drug level monitoring: Secondary | ICD-10-CM | POA: Diagnosis not present

## 2017-10-09 DIAGNOSIS — R1311 Dysphagia, oral phase: Secondary | ICD-10-CM | POA: Diagnosis not present

## 2017-10-09 DIAGNOSIS — R293 Abnormal posture: Secondary | ICD-10-CM | POA: Diagnosis not present

## 2017-10-09 DIAGNOSIS — J1089 Influenza due to other identified influenza virus with other manifestations: Secondary | ICD-10-CM | POA: Diagnosis not present

## 2017-10-09 DIAGNOSIS — R296 Repeated falls: Secondary | ICD-10-CM | POA: Diagnosis not present

## 2017-10-10 ENCOUNTER — Encounter (HOSPITAL_COMMUNITY): Payer: Self-pay | Admitting: Emergency Medicine

## 2017-10-10 ENCOUNTER — Emergency Department (HOSPITAL_COMMUNITY): Payer: Medicare Other

## 2017-10-10 ENCOUNTER — Observation Stay (HOSPITAL_COMMUNITY)
Admission: EM | Admit: 2017-10-10 | Discharge: 2017-10-12 | Disposition: A | Discharge status: Skilled Nursing Facility | Payer: Medicare Other | Attending: Internal Medicine | Admitting: Internal Medicine

## 2017-10-10 DIAGNOSIS — I482 Chronic atrial fibrillation: Secondary | ICD-10-CM | POA: Diagnosis not present

## 2017-10-10 DIAGNOSIS — Z8673 Personal history of transient ischemic attack (TIA), and cerebral infarction without residual deficits: Secondary | ICD-10-CM | POA: Diagnosis not present

## 2017-10-10 DIAGNOSIS — I088 Other rheumatic multiple valve diseases: Secondary | ICD-10-CM | POA: Diagnosis not present

## 2017-10-10 DIAGNOSIS — F330 Major depressive disorder, recurrent, mild: Secondary | ICD-10-CM | POA: Diagnosis not present

## 2017-10-10 DIAGNOSIS — E785 Hyperlipidemia, unspecified: Secondary | ICD-10-CM | POA: Diagnosis not present

## 2017-10-10 DIAGNOSIS — Z87891 Personal history of nicotine dependence: Secondary | ICD-10-CM | POA: Insufficient documentation

## 2017-10-10 DIAGNOSIS — Z853 Personal history of malignant neoplasm of breast: Secondary | ICD-10-CM | POA: Insufficient documentation

## 2017-10-10 DIAGNOSIS — F0280 Dementia in other diseases classified elsewhere without behavioral disturbance: Secondary | ICD-10-CM | POA: Diagnosis not present

## 2017-10-10 DIAGNOSIS — I110 Hypertensive heart disease with heart failure: Secondary | ICD-10-CM | POA: Diagnosis not present

## 2017-10-10 DIAGNOSIS — Z7901 Long term (current) use of anticoagulants: Secondary | ICD-10-CM | POA: Diagnosis not present

## 2017-10-10 DIAGNOSIS — D649 Anemia, unspecified: Secondary | ICD-10-CM | POA: Diagnosis not present

## 2017-10-10 DIAGNOSIS — I700 Atherosclerosis of aorta: Secondary | ICD-10-CM | POA: Insufficient documentation

## 2017-10-10 DIAGNOSIS — K5900 Constipation, unspecified: Secondary | ICD-10-CM | POA: Diagnosis not present

## 2017-10-10 DIAGNOSIS — Z993 Dependence on wheelchair: Secondary | ICD-10-CM | POA: Insufficient documentation

## 2017-10-10 DIAGNOSIS — E1165 Type 2 diabetes mellitus with hyperglycemia: Secondary | ICD-10-CM | POA: Insufficient documentation

## 2017-10-10 DIAGNOSIS — E1151 Type 2 diabetes mellitus with diabetic peripheral angiopathy without gangrene: Secondary | ICD-10-CM | POA: Diagnosis not present

## 2017-10-10 DIAGNOSIS — R0602 Shortness of breath: Secondary | ICD-10-CM | POA: Diagnosis not present

## 2017-10-10 DIAGNOSIS — R0789 Other chest pain: Principal | ICD-10-CM | POA: Insufficient documentation

## 2017-10-10 DIAGNOSIS — F419 Anxiety disorder, unspecified: Secondary | ICD-10-CM | POA: Diagnosis not present

## 2017-10-10 DIAGNOSIS — K5730 Diverticulosis of large intestine without perforation or abscess without bleeding: Secondary | ICD-10-CM | POA: Insufficient documentation

## 2017-10-10 DIAGNOSIS — Z794 Long term (current) use of insulin: Secondary | ICD-10-CM | POA: Insufficient documentation

## 2017-10-10 DIAGNOSIS — R1030 Lower abdominal pain, unspecified: Secondary | ICD-10-CM | POA: Diagnosis not present

## 2017-10-10 DIAGNOSIS — F0390 Unspecified dementia without behavioral disturbance: Secondary | ICD-10-CM | POA: Diagnosis not present

## 2017-10-10 DIAGNOSIS — N179 Acute kidney failure, unspecified: Secondary | ICD-10-CM | POA: Insufficient documentation

## 2017-10-10 DIAGNOSIS — K219 Gastro-esophageal reflux disease without esophagitis: Secondary | ICD-10-CM | POA: Diagnosis not present

## 2017-10-10 DIAGNOSIS — R079 Chest pain, unspecified: Secondary | ICD-10-CM | POA: Diagnosis not present

## 2017-10-10 DIAGNOSIS — Z79899 Other long term (current) drug therapy: Secondary | ICD-10-CM | POA: Diagnosis not present

## 2017-10-10 DIAGNOSIS — G301 Alzheimer's disease with late onset: Secondary | ICD-10-CM | POA: Diagnosis not present

## 2017-10-10 DIAGNOSIS — M542 Cervicalgia: Secondary | ICD-10-CM | POA: Diagnosis not present

## 2017-10-10 DIAGNOSIS — F429 Obsessive-compulsive disorder, unspecified: Secondary | ICD-10-CM | POA: Diagnosis not present

## 2017-10-10 DIAGNOSIS — R300 Dysuria: Secondary | ICD-10-CM | POA: Diagnosis not present

## 2017-10-10 DIAGNOSIS — I4821 Permanent atrial fibrillation: Secondary | ICD-10-CM | POA: Diagnosis present

## 2017-10-10 DIAGNOSIS — E119 Type 2 diabetes mellitus without complications: Secondary | ICD-10-CM

## 2017-10-10 DIAGNOSIS — E039 Hypothyroidism, unspecified: Secondary | ICD-10-CM | POA: Diagnosis not present

## 2017-10-10 DIAGNOSIS — G4700 Insomnia, unspecified: Secondary | ICD-10-CM | POA: Diagnosis not present

## 2017-10-10 DIAGNOSIS — I5032 Chronic diastolic (congestive) heart failure: Secondary | ICD-10-CM | POA: Insufficient documentation

## 2017-10-10 HISTORY — DX: Permanent atrial fibrillation: I48.21

## 2017-10-10 LAB — CBC WITH DIFFERENTIAL/PLATELET
Basophils Absolute: 0 10*3/uL (ref 0.0–0.1)
Basophils Relative: 0 %
Eosinophils Absolute: 0.3 10*3/uL (ref 0.0–0.7)
Eosinophils Relative: 6 %
HCT: 32.5 % — ABNORMAL LOW (ref 36.0–46.0)
Hemoglobin: 10.1 g/dL — ABNORMAL LOW (ref 12.0–15.0)
Lymphocytes Relative: 24 %
Lymphs Abs: 1.1 10*3/uL (ref 0.7–4.0)
MCH: 26.2 pg (ref 26.0–34.0)
MCHC: 31.1 g/dL (ref 30.0–36.0)
MCV: 84.4 fL (ref 78.0–100.0)
Monocytes Absolute: 0.4 10*3/uL (ref 0.1–1.0)
Monocytes Relative: 8 %
Neutro Abs: 3 10*3/uL (ref 1.7–7.7)
Neutrophils Relative %: 62 %
Platelets: 223 10*3/uL (ref 150–400)
RBC: 3.85 MIL/uL — ABNORMAL LOW (ref 3.87–5.11)
RDW: 16.4 % — ABNORMAL HIGH (ref 11.5–15.5)
WBC: 4.7 10*3/uL (ref 4.0–10.5)

## 2017-10-10 LAB — COMPREHENSIVE METABOLIC PANEL
ALT: 11 U/L — ABNORMAL LOW (ref 14–54)
AST: 18 U/L (ref 15–41)
Albumin: 3.1 g/dL — ABNORMAL LOW (ref 3.5–5.0)
Alkaline Phosphatase: 122 U/L (ref 38–126)
Anion gap: 12 (ref 5–15)
BUN: 36 mg/dL — ABNORMAL HIGH (ref 6–20)
CO2: 26 mmol/L (ref 22–32)
Calcium: 9.1 mg/dL (ref 8.9–10.3)
Chloride: 96 mmol/L — ABNORMAL LOW (ref 101–111)
Creatinine, Ser: 1.26 mg/dL — ABNORMAL HIGH (ref 0.44–1.00)
GFR calc Af Amer: 44 mL/min — ABNORMAL LOW (ref >60)
GFR calc non Af Amer: 38 mL/min — ABNORMAL LOW (ref >60)
Glucose, Bld: 351 mg/dL — ABNORMAL HIGH (ref 65–99)
Potassium: 4 mmol/L (ref 3.5–5.1)
Sodium: 134 mmol/L — ABNORMAL LOW (ref 135–145)
Total Bilirubin: 0.3 mg/dL (ref 0.3–1.2)
Total Protein: 6.5 g/dL (ref 6.5–8.1)

## 2017-10-10 LAB — D-DIMER, QUANTITATIVE (NOT AT ARMC): D-Dimer, Quant: 0.49 ug/mL-FEU (ref 0.00–0.50)

## 2017-10-10 LAB — BRAIN NATRIURETIC PEPTIDE: B Natriuretic Peptide: 164.8 pg/mL — ABNORMAL HIGH (ref 0.0–100.0)

## 2017-10-10 LAB — I-STAT TROPONIN, ED: Troponin i, poc: 0 ng/mL (ref 0.00–0.08)

## 2017-10-10 MED ORDER — IOPAMIDOL (ISOVUE-300) INJECTION 61%
INTRAVENOUS | Status: AC
  Filled 2017-10-10: qty 30

## 2017-10-10 NOTE — ED Triage Notes (Signed)
Per [facility] patient coming from [place] complaining of chest pain that began at 445pm today. Patient describes pain as substernal with radiation into the neck. Facility gave patient 2 nitro. Patient denying pain on arrival. Patient alert and oriented x4.  324 aspirin administered by ems

## 2017-10-10 NOTE — ED Notes (Signed)
Patient transported to X-ray 

## 2017-10-10 NOTE — ED Notes (Addendum)
Patient family, Jerry Haugen can be reached at [phone]

## 2017-10-10 NOTE — ED Provider Notes (Signed)
[facility] EMERGENCY DEPARTMENT Provider Note   CSN: [id number] Arrival date & time: 10/10/17  1901     History   Chief Complaint Chief Complaint  Patient presents with  . Chest Pain    HPI Joanna Hill is a 84 y.o. female with history of chronic atrial fibrillation anticoagulated on Xarelto, CHF, diabetes, hypertension, hypothyroidism who presents with a 2-hour history of intermittent sharp central chest pain.  Patient has had associated shortness of breath.  Her pain radiates into the neck.  She also reports lower abdominal pain and dysuria for the past week.  She denies any nausea or vomiting.  She reports she has had some coughing and nasal congestion for the past week as well.  Aspirin 324 mg given by EMS.  Patient coming from [place] and was given 2 nitroglycerin.  Patient denies pain on arrival.  She reports intermittent leg swelling, however this is unchanged from past.  HPI  Past Medical History:  Diagnosis Date  . Atrial fibrillation with RVR ([place]) 02/28/2017  . Blindness   . Cancer ([place])    breast  . Carotid artery occlusion   . Dementia   . Diabetes mellitus   . GERD (gastroesophageal reflux disease)   . Hyperlipidemia   . Hypertension   . Hypothyroidism   . Knee injury 2018  . Stroke [facility]) 2009 and  2011   difficulty with swallowing    Patient Active Problem List   Diagnosis Date Noted  . Acute on chronic diastolic heart failure ([place]) 06/30/2017  . Permanent atrial fibrillation ([place]) 06/30/2017  . Leg swelling 06/23/2017  . Paroxysmal atrial fibrillation ([place]) 03/15/2017  . Current use of long term anticoagulation 03/15/2017  . Atrial fibrillation with RVR ([place]) 02/28/2017  . Hypothyroidism 02/28/2017  . Hypertension 02/28/2017  . History of CVA in adulthood 02/28/2017  . Dementia 02/28/2017  . Constipation 02/28/2017  . GERD (gastroesophageal reflux disease) 02/28/2017  . Diabetes mellitus type 2, insulin dependent ([place])  02/28/2017  . Hyperkalemia 01/18/2014  . Carotid artery disease ([place]) 09/28/2011  . HYPOTENSION 08/05/2008  . DYSPHAGIA 08/05/2008  . ALTERED MENTAL STATUS 08/01/2008  . HYPERLIPIDEMIA 09/25/2007  . Essential hypertension 09/25/2007  . CORONARY ARTERY DISEASE 09/25/2007    Past Surgical History:  Procedure Laterality Date  . ABDOMINAL HYSTERECTOMY    . Arch Aortogram  03-15-2010  . MASTECTOMY, PARTIAL Right   . ROTATOR CUFF REPAIR Left 1981    OB History    No data available       Home Medications    Prior to Admission medications   Medication Sig Start Date End Date Taking? Authorizing Provider  Bepotastine Besilate (BEPREVE) 1.5 % SOLN Place 1 drop into both eyes daily.   Yes [provider]  cetirizine (ZYRTEC) 10 MG tablet Take 1 tablet (10 mg total) by mouth 2 (two) times daily. Patient taking differently: Take 10 mg by mouth daily with breakfast.  05/31/17  Yes Kozlow, Donnamarie Poag, MD  diltiazem (CARDIZEM CD) 240 MG 24 hr capsule Take 240 mg by mouth daily with breakfast.  03/15/17  Yes Strader, [place] M, PA-C  divalproex (DEPAKOTE) 125 MG DR tablet Take 125 mg by mouth at bedtime.   Yes [provider]  donepezil (ARICEPT) 10 MG tablet Take 10 mg by mouth at bedtime.   Yes [provider]  Fluvoxamine Maleate (LUVOX CR) 150 MG CP24 Take 150 mg by mouth at bedtime.   Yes [provider]  furosemide (LASIX)  40 MG tablet Take 1 tablet (40 mg total) daily by mouth. Patient taking differently: Take 40 mg by mouth daily with breakfast.  06/23/17  Yes Minus Breeding, MD  hydrALAZINE (APRESOLINE) 25 MG tablet Take 25 mg by mouth 2 (two) times daily.   Yes [provider]  levothyroxine (SYNTHROID, LEVOTHROID) 100 MCG tablet Take 100 mcg by mouth daily before breakfast.   Yes [provider]  mirtazapine (REMERON) 15 MG tablet Take 15 mg by mouth at bedtime.   Yes [provider]  neomycin-polymyxin-dexameth (MAXITROL)  0.1 % OINT Place 1 application into both eyes at bedtime.   Yes [provider]  pantoprazole (PROTONIX) 40 MG tablet Take 40 mg by mouth daily with breakfast.  11/21/16  Yes [provider]  Polyethyl Glycol-Propyl Glycol (SYSTANE) 0.4-0.3 % GEL ophthalmic gel Place 1 application into both eyes at bedtime.   Yes [provider]  polyethylene glycol (MIRALAX / GLYCOLAX) packet Take 17 g by mouth daily as needed. Patient taking differently: Take 17 g by mouth daily as needed for mild constipation.  03/02/17  Yes Rosita Fire, MD  potassium chloride SA (K-DUR,KLOR-CON) 20 MEQ tablet Take 20 mEq by mouth daily with breakfast.  04/25/17  Yes [provider]  Rivaroxaban (XARELTO) 15 MG TABS tablet Take 15 mg by mouth daily with supper.   Yes [provider]  Soft Lens Products (REFRESH CONTACTS DROPS) SOLN Place 1 drop into both eyes 4 (four) times daily.   Yes [provider]  sucralfate (CARAFATE) 1 g tablet Take 1 g by mouth 4 (four) times daily.  01/20/16  Yes [provider]  traMADol (ULTRAM) 50 MG tablet Take 50 mg every 8 (eight) hours as needed by mouth for moderate pain.    Yes [provider]  traZODone (DESYREL) 50 MG tablet Take 25 mg at bedtime by mouth.  05/24/17  Yes [provider]  triamcinolone cream (KENALOG) 0.5 % Apply 1 application topically 2 (two) times daily. Apply to legs for itching/rash 10/14/16  Yes [provider]  white petrolatum (VASELINE) GEL Apply 1 application topically at bedtime.   Yes [provider]  acetaminophen (TYLENOL) 325 MG tablet Take 650 mg by mouth every 6 (six) hours as needed for mild pain, moderate pain, fever or headache.     [provider]  antiseptic oral rinse (BIOTENE) LIQD 5 mLs by Mouth Rinse route 2 (two) times daily.    [provider]  cyproheptadine (PERIACTIN) 4 MG tablet Take one tablet twice daily 07/12/16   Kozlow, Donnamarie Poag, MD  diclofenac sodium (VOLTAREN) 1 % GEL Apply 2 g topically as needed (for right hip and thigh pain). May use gel to right hip and thigh as needed for pain.     [provider]  diphenhydrAMINE (BENADRYL) 25 mg capsule Take 25 mg by mouth every 6 (six) hours as needed for itching.     [provider]  docusate (COLACE) 50 MG/5ML liquid Take 100 mg by mouth 2 (two) times daily.    [provider]  Eyelid Cleansers (OCUSOFT LID SCRUB) PADS Apply 1 each topically 2 (two) times daily.    [provider]  fluticasone (FLONASE) 50 MCG/ACT nasal spray Place 1 spray into both nostrils 2 (two) times daily.     [provider]  gabapentin (NEURONTIN) 300 MG capsule Take 300 mg by mouth 3 (three) times daily.     [provider]  guaiFENesin (  MUCINEX) 600 MG 12 hr tablet Take 600 mg by mouth 2 (two) times daily.    [provider]  hydroxypropyl methylcellulose / hypromellose (ISOPTO TEARS / GONIOVISC) 2.5 % ophthalmic solution Place 1 drop into both eyes 4 (four) times daily.    [provider]  insulin aspart (NOVOLOG) 100 UNIT/ML injection Inject 2-11 Units into the skin 4 (four) times daily. BL. MOD. ACCUCHECK WITH S/S USE NOVOLOG INSULIN  101-150 = 2U 151-200= 3U 201-250 = 5U 251-300 = 7U 301-350 = 9U >350 = 11U CALL MD FOR BS.400 OR < 60 Taken at 6am 11:30 am 14:30 and 21:00    [provider]  ketotifen (ZADITOR) 0.025 % ophthalmic solution Place 1 drop into both eyes 2 (two) times daily.     [provider]  levalbuterol Penne Lash) 0.63 MG/3ML nebulizer solution Take 0.63 mg every 6 (six) hours as needed by nebulization for wheezing or shortness of breath.  04/27/17   [provider]  LEVEMIR FLEXTOUCH 100 UNIT/ML Pen Inject 24 Units into the skin daily at 10 pm. 03/02/17   Rosita Fire, MD  magic mouthwash SOLN Take 5 mLs by mouth 2 (two) times daily.    [provider]    nitroGLYCERIN (NITROSTAT) 0.4 MG SL tablet Place 0.4 mg every 5 (five) minutes as needed under the tongue for chest pain.  04/25/17   [provider]  ranitidine (ZANTAC) 150 MG tablet Take 150 mg by mouth 2 (two) times daily.    [provider]  Saline 0.65 % (Soln) SOLN Place 1 spray into both nostrils every 2 (two) hours as needed (for dry nares).     [provider]    Family History Family History  Problem Relation Age of Onset  . Cancer Mother   . Heart disease Father        Heart disease before age 37  . Asthma Sister   . Diabetes Sister   . Breast cancer Sister   . Diabetes Sister   . Diabetes Sister   . Diabetes Sister   . Diabetes Sister   . Diabetes Sister   . Diabetes Sister   . Diabetes Sister   . Diabetes Sister     Social History Social History   Tobacco Use  . Smoking status: Never Smoker  . Smokeless tobacco: Former Systems developer    Types: Snuff  Substance Use Topics  . Alcohol use: No  . Drug use: No     Allergies   Patient has no known allergies.   Review of Systems Review of Systems  Constitutional: Negative for chills and fever.  HENT: Positive for congestion. Negative for facial swelling and sore throat.   Respiratory: Positive for cough and shortness of breath.   Cardiovascular: Positive for chest pain.  Gastrointestinal: Positive for abdominal pain (suprapubic). Negative for nausea and vomiting.  Genitourinary: Positive for dysuria.  Musculoskeletal: Negative for back pain.  Skin: Negative for rash and wound.  Neurological: Negative for headaches.  Psychiatric/Behavioral: The patient is not nervous/anxious.      Physical Exam Updated Vital Signs BP 110/72   Pulse 99   Temp 98.4 F (36.9 C) (Oral)   Resp 15   SpO2 98%   Physical Exam  Constitutional: She appears well-developed and well-nourished. No distress.  HENT:  Head: Normocephalic and atraumatic.  Mouth/Throat: Oropharynx is clear and moist. No  oropharyngeal exudate.  Eyes: Conjunctivae are normal. Pupils are equal, round, and reactive to light. Right eye  exhibits no discharge. Left eye exhibits no discharge. No scleral icterus.  Neck: Normal range of motion. Neck supple. No thyromegaly present.  Cardiovascular: Normal rate, regular rhythm, normal heart sounds and intact distal pulses. Exam reveals no gallop and no friction rub.  No murmur heard. Pulmonary/Chest: Effort normal. No stridor. No respiratory distress. She has no wheezes. She has rales (left base). She exhibits no tenderness.  Abdominal: Soft. Bowel sounds are normal. She exhibits no distension. There is tenderness in the epigastric area and suprapubic area. There is no rebound and no guarding.  Musculoskeletal: She exhibits no edema.  Lymphadenopathy:    She has no cervical adenopathy.  Neurological: She is alert. Coordination normal.  Skin: Skin is warm and dry. No rash noted. She is not diaphoretic. No pallor.  Psychiatric: She has a normal mood and affect.  Nursing note and vitals reviewed.    ED Treatments / Results  Labs (all labs ordered are listed, but only abnormal results are displayed) Labs Reviewed  COMPREHENSIVE METABOLIC PANEL - Abnormal; Notable for the following components:      Result Value   Sodium 134 (*)    Chloride 96 (*)    Glucose, Bld 351 (*)    BUN 36 (*)    Creatinine, Ser 1.26 (*)    Albumin 3.1 (*)    ALT 11 (*)    GFR calc non Af Amer 38 (*)    GFR calc Af Amer 44 (*)    All other components within normal limits  CBC WITH DIFFERENTIAL/PLATELET - Abnormal; Notable for the following components:   RBC 3.85 (*)    Hemoglobin 10.1 (*)    HCT 32.5 (*)    RDW 16.4 (*)    All other components within normal limits  BRAIN NATRIURETIC PEPTIDE - Abnormal; Notable for the following components:   B Natriuretic Peptide 164.8 (*)    All other components within normal limits  D-DIMER, QUANTITATIVE (NOT AT [facility])  URINALYSIS, ROUTINE W REFLEX  MICROSCOPIC  I-STAT TROPONIN, ED    EKG  EKG Interpretation  Date/Time:  Tuesday October 10 2017 19:04:28 EST Ventricular Rate:  91 PR Interval:    QRS Duration: 112 QT Interval:  368 QTC Calculation: 453 R Axis:   52 Text Interpretation:  Atrial fibrillation Borderline intraventricular conduction delay Low voltage, precordial leads afib with RVR no longer present Confirmed by Sherwood Gambler [phone]) on 10/10/2017 8:12:58 PM       Radiology Dg Chest 2 View  Result Date: 10/10/2017 CLINICAL DATA:  Chest pain, shortness of Breath EXAM: CHEST  2 VIEW COMPARISON:  02/28/2017 FINDINGS: Cardiomegaly. No confluent airspace opacities, effusions or edema. No acute bony abnormality. IMPRESSION: Cardiomegaly.  No active disease. Electronically Signed   By: Rolm Baptise M.D.   On: 10/10/2017 20:46    Procedures Procedures (including critical care time)  Medications Ordered in ED Medications - No data to display   Initial Impression / Assessment and Plan / ED Course  I have reviewed the triage vital signs and the nursing notes.  Pertinent labs & imaging results that were available during my care of the patient were reviewed by me and considered in my medical decision making (see chart for details).     CHA2DS2/VAS Stroke Risk 6   Patient with exertional chest pain and shortness of breath resolved with nitroglycerin.  Initial troponin is negative, however considering patient's age and risk factors, we feel patient should be admitted for cardiac rule out.  CMP shows  sodium 134, chloride 96, glucose 351, BUN 36, creatinine 1.26.  D-dimer is negative.  BNP 164.8.  Hemoglobin 10.1, which is stable for the patient.  Chest x-ray shows cardiomegaly, but no active disease.  EKG shows atrial fibrillation.  Patient is anticoagulated on Xarelto.  I spoke with Dr. Hal Hope with [facility] who agrees to admit the patient.  Appreciate his assistance.  Patient also evaluated by Dr. Regenia Skeeter who  agrees with plan.  Final Clinical Impressions(s) / ED Diagnoses   Final diagnoses:  Chest pain, unspecified type    ED Discharge Orders    None       Frederica Kuster, PA-C 10/10/17 2249    Sherwood Gambler, MD 10/11/17 1459

## 2017-10-11 ENCOUNTER — Observation Stay (HOSPITAL_BASED_OUTPATIENT_CLINIC_OR_DEPARTMENT_OTHER): Payer: Medicare Other

## 2017-10-11 ENCOUNTER — Other Ambulatory Visit: Payer: Self-pay

## 2017-10-11 ENCOUNTER — Observation Stay (HOSPITAL_COMMUNITY): Payer: Medicare Other

## 2017-10-11 ENCOUNTER — Encounter: Payer: Self-pay | Admitting: Cardiology

## 2017-10-11 ENCOUNTER — Encounter (HOSPITAL_COMMUNITY): Payer: Self-pay | Admitting: Internal Medicine

## 2017-10-11 DIAGNOSIS — E119 Type 2 diabetes mellitus without complications: Secondary | ICD-10-CM | POA: Diagnosis not present

## 2017-10-11 DIAGNOSIS — I482 Chronic atrial fibrillation: Secondary | ICD-10-CM

## 2017-10-11 DIAGNOSIS — Z7901 Long term (current) use of anticoagulants: Secondary | ICD-10-CM | POA: Diagnosis not present

## 2017-10-11 DIAGNOSIS — R079 Chest pain, unspecified: Secondary | ICD-10-CM

## 2017-10-11 DIAGNOSIS — I351 Nonrheumatic aortic (valve) insufficiency: Secondary | ICD-10-CM

## 2017-10-11 DIAGNOSIS — I5032 Chronic diastolic (congestive) heart failure: Secondary | ICD-10-CM | POA: Diagnosis not present

## 2017-10-11 DIAGNOSIS — Z794 Long term (current) use of insulin: Secondary | ICD-10-CM | POA: Diagnosis not present

## 2017-10-11 DIAGNOSIS — R109 Unspecified abdominal pain: Secondary | ICD-10-CM | POA: Diagnosis not present

## 2017-10-11 DIAGNOSIS — R0789 Other chest pain: Secondary | ICD-10-CM | POA: Diagnosis not present

## 2017-10-11 DIAGNOSIS — R072 Precordial pain: Secondary | ICD-10-CM | POA: Diagnosis not present

## 2017-10-11 DIAGNOSIS — F0390 Unspecified dementia without behavioral disturbance: Secondary | ICD-10-CM | POA: Diagnosis not present

## 2017-10-11 LAB — GLUCOSE, CAPILLARY
GLUCOSE-CAPILLARY: 242 mg/dL — AB (ref 65–99)
GLUCOSE-CAPILLARY: 234 mg/dL — AB (ref 65–99)
Glucose-Capillary: 228 mg/dL — ABNORMAL HIGH (ref 65–99)
Glucose-Capillary: 158 mg/dL — ABNORMAL HIGH (ref 65–99)
Glucose-Capillary: 111 mg/dL — ABNORMAL HIGH (ref 65–99)

## 2017-10-11 LAB — URINALYSIS, ROUTINE W REFLEX MICROSCOPIC
Bacteria, UA: NONE SEEN
Bilirubin Urine: NEGATIVE
GLUCOSE, UA: NEGATIVE mg/dL
HGB URINE DIPSTICK: NEGATIVE
KETONES UR: NEGATIVE mg/dL
NITRITE: NEGATIVE
PROTEIN: NEGATIVE mg/dL
Specific Gravity, Urine: 1.014 (ref 1.005–1.030)
pH: 5 (ref 5.0–8.0)

## 2017-10-11 LAB — TROPONIN I: Troponin I: <0.03 ng/mL (ref <0.03)

## 2017-10-11 LAB — MRSA PCR SCREENING: MRSA by PCR: NEGATIVE

## 2017-10-11 LAB — ECHOCARDIOGRAM COMPLETE
HEIGHTINCHES: 64 in
Weight: 2171.2 oz

## 2017-10-11 MED ORDER — NITROGLYCERIN 0.4 MG SL SUBL: 0.4000 mg | SUBLINGUAL_TABLET | SUBLINGUAL | Status: DC | PRN

## 2017-10-11 MED ORDER — TRAZODONE HCL 50 MG PO TABS
25.0000 mg | ORAL_TABLET | Freq: Every day | ORAL | Status: DC
  Administered 2017-10-11: 25 mg via ORAL
  Filled 2017-10-11: qty 1

## 2017-10-11 MED ORDER — LEVOTHYROXINE SODIUM 100 MCG PO TABS
100.0000 ug | ORAL_TABLET | Freq: Every day | ORAL | Status: DC
  Administered 2017-10-11 – 2017-10-12 (×2): 100 ug via ORAL
  Filled 2017-10-11 (×2): qty 1

## 2017-10-11 MED ORDER — LORATADINE 10 MG PO TABS
10.0000 mg | ORAL_TABLET | Freq: Every day | ORAL | Status: DC
  Administered 2017-10-11 – 2017-10-12 (×2): 10 mg via ORAL
  Filled 2017-10-11 (×2): qty 1

## 2017-10-11 MED ORDER — FAMOTIDINE 20 MG PO TABS
20.0000 mg | ORAL_TABLET | Freq: Two times a day (BID) | ORAL | Status: DC
  Administered 2017-10-11 (×3): 20 mg via ORAL
  Filled 2017-10-11 (×3): qty 1

## 2017-10-11 MED ORDER — SUCRALFATE 1 G PO TABS
1.0000 g | ORAL_TABLET | Freq: Four times a day (QID) | ORAL | Status: DC
Start: 2017-10-11 — End: 2017-10-12
  Administered 2017-10-11 – 2017-10-12 (×7): 1 g via ORAL
  Filled 2017-10-11 (×7): qty 1

## 2017-10-11 MED ORDER — INSULIN DETEMIR 100 UNIT/ML ~~LOC~~ SOLN
24.0000 [IU] | Freq: Every day | SUBCUTANEOUS | Status: DC
  Administered 2017-10-11 (×2): 24 [IU] via SUBCUTANEOUS
  Filled 2017-10-11 (×3): qty 0.24

## 2017-10-11 MED ORDER — ACETAMINOPHEN 325 MG PO TABS: 650.0000 mg | ORAL_TABLET | ORAL | Status: DC | PRN

## 2017-10-11 MED ORDER — ARTIFICIAL TEARS OPHTHALMIC OINT
1.0000 "application " | TOPICAL_OINTMENT | Freq: Every day | OPHTHALMIC | Status: DC
  Administered 2017-10-11 (×2): 1 via OPHTHALMIC
  Filled 2017-10-11: qty 3.5

## 2017-10-11 MED ORDER — DOCUSATE SODIUM 100 MG PO CAPS
100.0000 mg | ORAL_CAPSULE | Freq: Two times a day (BID) | ORAL | Status: DC
  Administered 2017-10-11 – 2017-10-12 (×4): 100 mg via ORAL
  Filled 2017-10-11 (×4): qty 1

## 2017-10-11 MED ORDER — POTASSIUM CHLORIDE CRYS ER 20 MEQ PO TBCR
20.0000 meq | EXTENDED_RELEASE_TABLET | Freq: Every day | ORAL | Status: DC
  Administered 2017-10-11 – 2017-10-12 (×2): 20 meq via ORAL
  Filled 2017-10-11 (×2): qty 1

## 2017-10-11 MED ORDER — DONEPEZIL HCL 10 MG PO TABS
10.0000 mg | ORAL_TABLET | Freq: Every day | ORAL | Status: DC
  Administered 2017-10-11 (×2): 10 mg via ORAL
  Filled 2017-10-11 (×2): qty 1

## 2017-10-11 MED ORDER — OLOPATADINE HCL 0.1 % OP SOLN
1.0000 [drp] | Freq: Two times a day (BID) | OPHTHALMIC | Status: DC
  Administered 2017-10-11 – 2017-10-12 (×4): 1 [drp] via OPHTHALMIC
  Filled 2017-10-11: qty 5

## 2017-10-11 MED ORDER — HYDRALAZINE HCL 25 MG PO TABS
25.0000 mg | ORAL_TABLET | Freq: Two times a day (BID) | ORAL | Status: DC
  Administered 2017-10-11 – 2017-10-12 (×4): 25 mg via ORAL
  Filled 2017-10-11 (×4): qty 1

## 2017-10-11 MED ORDER — GABAPENTIN 300 MG PO CAPS
300.0000 mg | ORAL_CAPSULE | Freq: Three times a day (TID) | ORAL | Status: DC
  Administered 2017-10-11: 300 mg via ORAL
  Filled 2017-10-11: qty 3

## 2017-10-11 MED ORDER — ONDANSETRON HCL 4 MG/2ML IJ SOLN: 4.0000 mg | Freq: Four times a day (QID) | INTRAMUSCULAR | Status: DC | PRN

## 2017-10-11 MED ORDER — POLYETHYLENE GLYCOL 3350 17 G PO PACK: 17.0000 g | PACK | Freq: Every day | ORAL | Status: DC | PRN

## 2017-10-11 MED ORDER — GUAIFENESIN ER 600 MG PO TB12
600.0000 mg | ORAL_TABLET | Freq: Two times a day (BID) | ORAL | Status: DC
  Administered 2017-10-11 – 2017-10-12 (×4): 600 mg via ORAL
  Filled 2017-10-11 (×4): qty 1

## 2017-10-11 MED ORDER — DIVALPROEX SODIUM 125 MG PO DR TAB
125.0000 mg | DELAYED_RELEASE_TABLET | Freq: Every day | ORAL | Status: DC
  Administered 2017-10-11 (×2): 125 mg via ORAL
  Filled 2017-10-11 (×3): qty 1

## 2017-10-11 MED ORDER — DILTIAZEM HCL ER COATED BEADS 240 MG PO CP24
240.0000 mg | ORAL_CAPSULE | Freq: Every day | ORAL | Status: DC
  Administered 2017-10-11 – 2017-10-12 (×2): 240 mg via ORAL
  Filled 2017-10-11 (×2): qty 1

## 2017-10-11 MED ORDER — TRIAMCINOLONE ACETONIDE 0.5 % EX CREA
1.0000 "application " | TOPICAL_CREAM | Freq: Two times a day (BID) | CUTANEOUS | Status: DC
  Administered 2017-10-11 – 2017-10-12 (×3): 1 via TOPICAL
  Filled 2017-10-11: qty 15

## 2017-10-11 MED ORDER — HYPROMELLOSE (GONIOSCOPIC) 2.5 % OP SOLN
1.0000 [drp] | Freq: Four times a day (QID) | OPHTHALMIC | Status: DC
  Administered 2017-10-11 – 2017-10-12 (×5): 1 [drp] via OPHTHALMIC
  Filled 2017-10-11: qty 15

## 2017-10-11 MED ORDER — FLUTICASONE PROPIONATE 50 MCG/ACT NA SUSP
1.0000 | Freq: Two times a day (BID) | NASAL | Status: DC
  Administered 2017-10-11 – 2017-10-12 (×3): 1 via NASAL
  Filled 2017-10-11: qty 16

## 2017-10-11 MED ORDER — SALINE SPRAY 0.65 % NA SOLN: 1.0000 | NASAL | Status: DC | PRN

## 2017-10-11 MED ORDER — INSULIN ASPART 100 UNIT/ML ~~LOC~~ SOLN
0.0000 [IU] | Freq: Three times a day (TID) | SUBCUTANEOUS | Status: DC
  Administered 2017-10-11: 3 [IU] via SUBCUTANEOUS
  Administered 2017-10-11 – 2017-10-12 (×3): 2 [IU] via SUBCUTANEOUS

## 2017-10-11 MED ORDER — RIVAROXABAN 15 MG PO TABS
15.0000 mg | ORAL_TABLET | Freq: Every day | ORAL | Status: DC
  Administered 2017-10-11 – 2017-10-12 (×3): 15 mg via ORAL
  Filled 2017-10-11 (×3): qty 1

## 2017-10-11 MED ORDER — FUROSEMIDE 40 MG PO TABS
40.0000 mg | ORAL_TABLET | Freq: Every day | ORAL | Status: DC
  Administered 2017-10-11 – 2017-10-12 (×2): 40 mg via ORAL
  Filled 2017-10-11 (×2): qty 1

## 2017-10-11 MED ORDER — TRAZODONE HCL 50 MG PO TABS: 25.0000 mg | ORAL_TABLET | Freq: Every evening | ORAL | Status: DC | PRN

## 2017-10-11 MED ORDER — CYPROHEPTADINE HCL 4 MG PO TABS
4.0000 mg | ORAL_TABLET | Freq: Two times a day (BID) | ORAL | Status: DC
  Administered 2017-10-11 – 2017-10-12 (×4): 4 mg via ORAL
  Filled 2017-10-11 (×5): qty 1

## 2017-10-11 MED ORDER — PANTOPRAZOLE SODIUM 40 MG PO TBEC
40.0000 mg | DELAYED_RELEASE_TABLET | Freq: Every day | ORAL | Status: DC
  Administered 2017-10-11 – 2017-10-12 (×2): 40 mg via ORAL
  Filled 2017-10-11 (×2): qty 1

## 2017-10-11 MED ORDER — FLUVOXAMINE MALEATE ER 150 MG PO CP24: 150.0000 mg | ORAL_CAPSULE | Freq: Every day | ORAL | Status: DC

## 2017-10-11 MED ORDER — TRAMADOL HCL 50 MG PO TABS: 50.0000 mg | ORAL_TABLET | Freq: Three times a day (TID) | ORAL | Status: DC | PRN

## 2017-10-11 MED ORDER — LEVALBUTEROL HCL 0.63 MG/3ML IN NEBU: 0.6300 mg | INHALATION_SOLUTION | Freq: Four times a day (QID) | RESPIRATORY_TRACT | Status: DC | PRN

## 2017-10-11 MED ORDER — MIRTAZAPINE 15 MG PO TABS
15.0000 mg | ORAL_TABLET | Freq: Every day | ORAL | Status: DC
  Administered 2017-10-11 (×2): 15 mg via ORAL
  Filled 2017-10-11 (×2): qty 1

## 2017-10-11 NOTE — Progress Notes (Signed)
  Echocardiogram 2D Echocardiogram has been performed.  Joanna Hill 10/11/2017, 11:56 AM

## 2017-10-11 NOTE — Progress Notes (Signed)
Pt is drowsy  supervising while eating. [name] the Daughter in law called for an update. Patient stated that she can get update. And [name] stated that she was also concerned about her being drowsy.

## 2017-10-11 NOTE — [place] FL2 (Signed)
[place] MEDICAID FL2 LEVEL OF CARE SCREENING TOOL     IDENTIFICATION  Patient Name: Joanna Hill Birthdate: [birth year] Sex: female Admission Date (Current Location): 10/10/2017  [facility] and [place] Number:  Herbalist and Address:  [facility], [place] [address] [place], [place] [address]      Provider Number: [id number]  Attending Physician Name and Address:  Geradine Girt, DO  Relative Name and Phone Number:       Current Level of Care: Hospital Recommended Level of Care: [place] Prior Approval Number:    Date Approved/Denied:   PASRR Number: [id number]  Discharge Plan: SNF    Current Diagnoses: Patient Active Problem List   Diagnosis Date Noted  . Chronic diastolic CHF (congestive heart failure) ([place]) 10/11/2017  . Chest pain 10/10/2017  . Acute on chronic diastolic heart failure ([place]) 06/30/2017  . Permanent atrial fibrillation ([place]) 06/30/2017  . Leg swelling 06/23/2017  . Paroxysmal atrial fibrillation ([place]) 03/15/2017  . Current use of long term anticoagulation 03/15/2017  . Atrial fibrillation with RVR ([place]) 02/28/2017  . Hypothyroidism 02/28/2017  . Hypertension 02/28/2017  . History of CVA in adulthood 02/28/2017  . Dementia 02/28/2017  . Constipation 02/28/2017  . GERD (gastroesophageal reflux disease) 02/28/2017  . Diabetes mellitus type 2, insulin dependent ([place]) 02/28/2017  . Hyperkalemia 01/18/2014  . Carotid artery disease ([place]) 09/28/2011  . HYPOTENSION 08/05/2008  . DYSPHAGIA 08/05/2008  . ALTERED MENTAL STATUS 08/01/2008  . HYPERLIPIDEMIA 09/25/2007  . Essential hypertension 09/25/2007  . CORONARY ARTERY DISEASE 09/25/2007    Orientation RESPIRATION BLADDER Height & Weight     Self, Time, Situation, Place  Normal Incontinent, External catheter Weight: 135 lb 11.2 oz (61.6 kg) Height:  5\' 4"  (162.6 cm)  BEHAVIORAL SYMPTOMS/MOOD NEUROLOGICAL BOWEL NUTRITION STATUS  (None) (Dementia,  history of CVA.) Continent Diet(Carb modified)  AMBULATORY STATUS COMMUNICATION OF NEEDS Skin     Verbally Normal                       Personal Care Assistance Level of Assistance              Functional Limitations Info  Sight, Hearing, Speech Sight Info: Adequate Hearing Info: Adequate Speech Info: Adequate    SPECIAL CARE FACTORS FREQUENCY                       Contractures Contractures Info: Not present    Additional Factors Info  Code Status, Allergies Code Status Info: Full Allergies Info: NKDA           Current Medications (10/11/2017):  This is the current hospital active medication list Current Facility-Administered Medications  Medication Dose Route Frequency Provider Last Rate Last Dose  . acetaminophen (TYLENOL) tablet 650 mg  650 mg Oral Q4H PRN Rise Patience, MD      . artificial tears (LACRILUBE) ophthalmic ointment 1 application  1 application Both Eyes QHS Rise Patience, MD   1 application at 28/41/32 0200  . cyproheptadine (PERIACTIN) 4 MG tablet 4 mg  4 mg Oral BID Rise Patience, MD   4 mg at 10/11/17 1033  . diltiazem (CARDIZEM CD) 24 hr capsule 240 mg  240 mg Oral Q breakfast Rise Patience, MD   240 mg at 10/11/17 0753  . divalproex (DEPAKOTE) DR tablet 125 mg  125 mg Oral QHS Rise Patience, MD   125 mg at  10/11/17 0202  . docusate sodium (COLACE) capsule 100 mg  100 mg Oral BID Rise Patience, MD   100 mg at 10/11/17 1038  . donepezil (ARICEPT) tablet 10 mg  10 mg Oral QHS Rise Patience, MD   10 mg at 10/11/17 0202  . famotidine (PEPCID) tablet 20 mg  20 mg Oral BID Rise Patience, MD   20 mg at 10/11/17 1037  . fluticasone (FLONASE) 50 MCG/ACT nasal spray 1 spray  1 spray Each Nare BID Rise Patience, MD   1 spray at 10/11/17 1033  . Fluvoxamine Maleate CP24 150 mg  150 mg Oral QHS Rise Patience, MD      . furosemide (LASIX) tablet 40 mg  40 mg Oral Q breakfast  Rise Patience, MD   40 mg at 10/11/17 0753  . guaiFENesin (MUCINEX) 12 hr tablet 600 mg  600 mg Oral BID Rise Patience, MD   600 mg at 10/11/17 1038  . hydrALAZINE (APRESOLINE) tablet 25 mg  25 mg Oral BID Rise Patience, MD   25 mg at 10/11/17 1038  . hydroxypropyl methylcellulose / hypromellose (ISOPTO TEARS / GONIOVISC) 2.5 % ophthalmic solution 1 drop  1 drop Both Eyes QID Rise Patience, MD   1 drop at 10/11/17 1045  . insulin aspart (novoLOG) injection 0-9 Units  0-9 Units Subcutaneous TID WC Rise Patience, MD   3 Units at 10/11/17 1155  . insulin detemir (LEVEMIR) injection 24 Units  24 Units Subcutaneous Q2200 Rise Patience, MD   24 Units at 10/11/17 0204  . levalbuterol (XOPENEX) nebulizer solution 0.63 mg  0.63 mg Nebulization Q6H PRN Rise Patience, MD      . levothyroxine (SYNTHROID, LEVOTHROID) tablet 100 mcg  100 mcg Oral QAC breakfast Rise Patience, MD   100 mcg at 10/11/17 0753  . loratadine (CLARITIN) tablet 10 mg  10 mg Oral Daily Rise Patience, MD   10 mg at 10/11/17 1038  . mirtazapine (REMERON) tablet 15 mg  15 mg Oral QHS Rise Patience, MD   15 mg at 10/11/17 0203  . nitroGLYCERIN (NITROSTAT) SL tablet 0.4 mg  0.4 mg Sublingual Q5 min PRN Rise Patience, MD      . olopatadine (PATANOL) 0.1 % ophthalmic solution 1 drop  1 drop Both Eyes BID Rise Patience, MD   1 drop at 10/11/17 1046  . ondansetron (ZOFRAN) injection 4 mg  4 mg Intravenous Q6H PRN Rise Patience, MD      . pantoprazole (PROTONIX) EC tablet 40 mg  40 mg Oral Q breakfast Rise Patience, MD   40 mg at 10/11/17 0754  . polyethylene glycol (MIRALAX / GLYCOLAX) packet 17 g  17 g Oral Daily PRN Rise Patience, MD      . potassium chloride SA (K-DUR,KLOR-CON) CR tablet 20 mEq  20 mEq Oral Q breakfast Rise Patience, MD   20 mEq at 10/11/17 0754  . Rivaroxaban (XARELTO) tablet 15 mg  15 mg Oral Q supper Rise Patience, MD   15 mg at 10/11/17 0203  . sodium chloride (OCEAN) 0.65 % nasal spray 1 spray  1 spray Each Nare Q2H PRN Rise Patience, MD      . sucralfate (CARAFATE) tablet 1 g  1 g Oral QID Rise Patience, MD   1 g at 10/11/17 1038  . traZODone (DESYREL) tablet 25 mg  25 mg Oral QHS  PRN Geradine Girt, DO      . triamcinolone cream (KENALOG) 0.5 % 1 application  1 application Topical BID Rise Patience, MD   1 application at 46/65/99 1202     Discharge Medications: Please see discharge summary for a list of discharge medications.  Relevant Imaging Results:  Relevant Lab Results:   Additional Information SS#: [id number]  Candie Chroman, LCSW

## 2017-10-11 NOTE — Progress Notes (Signed)
Patient admitted after midnight, please see H&P.  Patient got numerous sedating medications last night and this AM and appears very sleepy.  Will d/c sedating meds.  Appreciated cardiology evaluation and agree with no further intervention.  Not sure what patient's mental baseline is-- will need to clarify.  Eulogio Bear DO

## 2017-10-11 NOTE — Consult Note (Addendum)
Cardiology Consultation:   Patient ID: Joanna Hill; [id number]; January 13, 1932   Admit date: 10/10/2017 Date of Consult: 10/11/2017  Primary Care Provider: Seward Carol, MD Primary Cardiologist: Dr Percival Spanish, 07/03/2017 Primary Electrophysiologist:  n/a   Patient Profile:   Joanna Hill is a 84 y.o. female with a hx of chronic atrial fibrillation, CHA2DS2VASc= 7 (age x 2, female, PAD, DM, CVA x 2, HTN) on Xarelto, blindness, breast cancer, carotid stenosis, dementia, diabetes, GERD hyperlipidemia, hypothyroidism, stroke, who is being seen today for the evaluation of chest pain at the request of Dr Eliseo Squires.  History of Present Illness:   Joanna Hill is sleepy, no family is present.  She is able to give me minimal information at this time, information was obtained from chart notes and staff.  Joanna Hill was pushing a wheelchair at the time she started having pain.  The pain was brief and resolved spontaneously.  However, it returned.  She was given sublingual nitroglycerin and the symptoms improved.  The pain is described as a pressure and also a sharp pain, and there were no associated symptoms.  One note describes pain radiating into her neck, she does not recall this at this time.  She has had some constipation, otherwise is in her usual state of health.  Currently, Joanna Hill is chest pain-free.  She denies shortness of breath or any other acute discomfort.  She is not able to rate the pain she was having yesterday.  There is a reported history of cough and nasal congestion, but she denies this at this time.  She has some lower extremity edema, which is unchanged.   Past Medical History:  Diagnosis Date  . Atrial fibrillation with RVR ([place]) 02/28/2017  . Blindness   . Cancer ([place])    breast  . Carotid artery occlusion   . Dementia   . Diabetes mellitus   . GERD (gastroesophageal reflux disease)   . Hyperlipidemia   . Hypertension   . Hypothyroidism   . Knee injury 2018  . Permanent  atrial fibrillation ([place]) 06/30/2017  . Stroke [facility]) 2009 and  2011   difficulty with swallowing    Past Surgical History:  Procedure Laterality Date  . ABDOMINAL HYSTERECTOMY    . Arch Aortogram  03-15-2010  . MASTECTOMY, PARTIAL Right   . ROTATOR CUFF REPAIR Left 1981     Prior to Admission medications   Medication Sig Start Date End Date Taking? Authorizing Provider  Bepotastine Besilate (BEPREVE) 1.5 % SOLN Place 1 drop into both eyes daily.   Yes [provider]  cetirizine (ZYRTEC) 10 MG tablet Take 1 tablet (10 mg total) by mouth 2 (two) times daily. Patient taking differently: Take 10 mg by mouth daily with breakfast.  05/31/17  Yes Kozlow, Donnamarie Poag, MD  diltiazem (CARDIZEM CD) 240 MG 24 hr capsule Take 240 mg by mouth daily with breakfast.  03/15/17  Yes Strader, [place] M, PA-C  divalproex (DEPAKOTE) 125 MG DR tablet Take 125 mg by mouth at bedtime.   Yes [provider]  donepezil (ARICEPT) 10 MG tablet Take 10 mg by mouth at bedtime.   Yes [provider]  Fluvoxamine Maleate (LUVOX CR) 150 MG CP24 Take 150 mg by mouth at bedtime.   Yes [provider]  furosemide (LASIX) 40 MG tablet Take 1 tablet (40 mg total) daily by mouth. Patient taking differently: Take 40 mg by mouth daily with breakfast.  06/23/17  Yes Minus Breeding, MD  hydrALAZINE (APRESOLINE) 25  MG tablet Take 25 mg by mouth 2 (two) times daily.   Yes [provider]  levothyroxine (SYNTHROID, LEVOTHROID) 100 MCG tablet Take 100 mcg by mouth daily before breakfast.   Yes [provider]  mirtazapine (REMERON) 15 MG tablet Take 15 mg by mouth at bedtime.   Yes [provider]  neomycin-polymyxin-dexameth (MAXITROL) 0.1 % OINT Place 1 application into both eyes at bedtime.   Yes [provider]  pantoprazole (PROTONIX) 40 MG tablet Take 40 mg by mouth daily with breakfast.  11/21/16  Yes [provider]  Polyethyl Glycol-Propyl Glycol  (SYSTANE) 0.4-0.3 % GEL ophthalmic gel Place 1 application into both eyes at bedtime.   Yes [provider]  polyethylene glycol (MIRALAX / GLYCOLAX) packet Take 17 g by mouth daily as needed. Patient taking differently: Take 17 g by mouth daily as needed for mild constipation.  03/02/17  Yes Rosita Fire, MD  potassium chloride SA (K-DUR,KLOR-CON) 20 MEQ tablet Take 20 mEq by mouth daily with breakfast.  04/25/17  Yes [provider]  Rivaroxaban (XARELTO) 15 MG TABS tablet Take 15 mg by mouth daily with supper.   Yes [provider]  Soft Lens Products (REFRESH CONTACTS DROPS) SOLN Place 1 drop into both eyes 4 (four) times daily.   Yes [provider]  sucralfate (CARAFATE) 1 g tablet Take 1 g by mouth 4 (four) times daily.  01/20/16  Yes [provider]  traMADol (ULTRAM) 50 MG tablet Take 50 mg every 8 (eight) hours as needed by mouth for moderate pain.    Yes [provider]  traZODone (DESYREL) 50 MG tablet Take 25 mg at bedtime by mouth.  05/24/17  Yes [provider]  triamcinolone cream (KENALOG) 0.5 % Apply 1 application topically 2 (two) times daily. Apply to legs for itching/rash 10/14/16  Yes [provider]  white petrolatum (VASELINE) GEL Apply 1 application topically at bedtime.   Yes [provider]  acetaminophen (TYLENOL) 325 MG tablet Take 650 mg by mouth every 6 (six) hours as needed for mild pain, moderate pain, fever or headache.     [provider]  antiseptic oral rinse (BIOTENE) LIQD 5 mLs by Mouth Rinse route 2 (two) times daily.    [provider]  cyproheptadine (PERIACTIN) 4 MG tablet Take one tablet twice daily 07/12/16   Kozlow, Donnamarie Poag, MD  diclofenac sodium (VOLTAREN) 1 % GEL Apply 2 g topically as needed (for right hip and thigh pain). May use gel to right hip and thigh as needed for pain.     [provider]  diphenhydrAMINE (BENADRYL) 25 mg capsule Take 25  mg by mouth every 6 (six) hours as needed for itching.     [provider]  docusate (COLACE) 50 MG/5ML liquid Take 100 mg by mouth 2 (two) times daily.    [provider]  Eyelid Cleansers (OCUSOFT LID SCRUB) PADS Apply 1 each topically 2 (two) times daily.    [provider]  fluticasone (FLONASE) 50 MCG/ACT nasal spray Place 1 spray into both nostrils 2 (two) times daily.     [provider]  gabapentin (NEURONTIN) 300 MG capsule Take 300 mg by mouth 3 (three) times daily.     [provider]  guaiFENesin (MUCINEX) 600 MG 12 hr tablet Take 600 mg by mouth 2 (two) times daily.    [provider]  hydroxypropyl methylcellulose / hypromellose (ISOPTO TEARS / GONIOVISC) 2.5 % ophthalmic solution  Place 1 drop into both eyes 4 (four) times daily.    [provider]  insulin aspart (NOVOLOG) 100 UNIT/ML injection Inject 2-11 Units into the skin 4 (four) times daily. BL. MOD. ACCUCHECK WITH S/S USE NOVOLOG INSULIN  101-150 = 2U 151-200= 3U 201-250 = 5U 251-300 = 7U 301-350 = 9U >350 = 11U CALL MD FOR BS.400 OR < 60 Taken at 6am 11:30 am 14:30 and 21:00    [provider]  ketotifen (ZADITOR) 0.025 % ophthalmic solution Place 1 drop into both eyes 2 (two) times daily.     [provider]  levalbuterol Penne Lash) 0.63 MG/3ML nebulizer solution Take 0.63 mg every 6 (six) hours as needed by nebulization for wheezing or shortness of breath.  04/27/17   [provider]  LEVEMIR FLEXTOUCH 100 UNIT/ML Pen Inject 24 Units into the skin daily at 10 pm. 03/02/17   Rosita Fire, MD  magic mouthwash SOLN Take 5 mLs by mouth 2 (two) times daily.    [provider]  nitroGLYCERIN (NITROSTAT) 0.4 MG SL tablet Place 0.4 mg every 5 (five) minutes as needed under the tongue for chest pain.  04/25/17   [provider]  ranitidine (ZANTAC) 150 MG tablet Take 150 mg by mouth 2 (two) times daily.    [provider]  Saline 0.65 % (Soln) SOLN Place 1 spray into both nostrils every 2 (two) hours as needed (for dry nares).     [provider]    Inpatient Medications: Scheduled Meds: . artificial tears  1 application Both Eyes QHS  . cyproheptadine  4 mg Oral BID  . diltiazem  240 mg Oral Q breakfast  . divalproex  125 mg Oral QHS  . docusate sodium  100 mg Oral BID  . donepezil  10 mg Oral QHS  . famotidine  20 mg Oral BID  . fluticasone  1 spray Each Nare BID  . Fluvoxamine Maleate  150 mg Oral QHS  . furosemide  40 mg Oral Q breakfast  . gabapentin  300 mg Oral TID  . guaiFENesin  600 mg Oral BID  . hydrALAZINE  25 mg Oral BID  . hydroxypropyl methylcellulose / hypromellose  1 drop Both Eyes QID  . insulin aspart  0-9 Units Subcutaneous TID WC  . insulin detemir  24 Units Subcutaneous Q2200  . iopamidol      . levothyroxine  100 mcg Oral QAC breakfast  . loratadine  10 mg Oral Daily  . mirtazapine  15 mg Oral QHS  . olopatadine  1 drop Both Eyes BID  . pantoprazole  40 mg Oral Q breakfast  . potassium chloride SA  20 mEq Oral Q breakfast  . Rivaroxaban  15 mg Oral Q supper  . sucralfate  1 g Oral QID  . traZODone  25 mg Oral QHS  . triamcinolone cream  1 application Topical BID   Continuous Infusions:  PRN Meds: acetaminophen, levalbuterol, nitroGLYCERIN, ondansetron (ZOFRAN) IV, polyethylene glycol, sodium chloride, traMADol  Allergies:   No Known Allergies  Social History:   Social History   Socioeconomic History  . Marital status: Widowed    Spouse name: Not on file  . Number of children: 3  . Years of education: 1st  . Highest education level: Not on file  Social Needs  . Financial resource strain: Not on file  . Food insecurity - worry: Not on file  . Food insecurity - inability: Not on file  . Transportation  needs - medical: Not on file  . Transportation needs - non-medical: Not on file  Occupational History  . Not on file  Tobacco Use  .  Smoking status: Never Smoker  . Smokeless tobacco: Former Systems developer    Types: Snuff  Substance and Sexual Activity  . Alcohol use: No  . Drug use: No  . Sexual activity: Not on file  Other Topics Concern  . Not on file  Social History Narrative   Patient is a resident at [facility], has 3 children   Patient is left handed   Educational level 1st grade   Caffeine consumption is 1-2 daily    Family History:   Family History  Problem Relation Age of Onset  . Cancer Mother   . Heart disease Father        Heart disease before age 14  . Asthma Sister   . Diabetes Sister   . Breast cancer Sister   . Diabetes Sister   . Diabetes Sister   . Diabetes Sister   . Diabetes Sister   . Diabetes Sister   . Diabetes Sister   . Diabetes Sister   . Diabetes Sister    Family Status:  Family Status  Relation Name Status  . Mother  Deceased  . Father  Deceased  . MGM  Deceased  . MGF  Deceased  . PGM  Deceased  . PGF  Deceased  . Sister  (Not Specified)  . Sister  (Not Specified)  . Sister  (Not Specified)  . Sister  (Not Specified)  . Sister  (Not Specified)  . Sister  (Not Specified)  . Sister  (Not Specified)  . Sister  (Not Specified)  . Sister  (Not Specified)    ROS:  Please see the history of present illness.  All other ROS reviewed and negative.     Physical Exam/Data:   Vitals:   10/11/17 0015 10/11/17 0057 10/11/17 0440 10/11/17 1146  BP: 134/79 117/61 124/69 (!) 113/50  Pulse: 77 79 74 78  Resp: 18 18 18 20   Temp:  (!) 97.5 F (36.4 C) 97.7 F (36.5 C) 97.7 F (36.5 C)  TempSrc:  Oral Oral Oral  SpO2: 99% 97% 98% 97%  Weight:  135 lb 11.2 oz (61.6 kg)    Height:  5\' 4"  (1.626 m)      Intake/Output Summary (Last 24 hours) at 10/11/2017 1150 Last data filed at 10/11/2017 0947 Gross per 24 hour  Intake 360 ml  Output 200 ml  Net 160 ml   Filed Weights   10/11/17 0057  Weight: 135 lb 11.2 oz (61.6 kg)   Body mass index is 23.29  kg/m.  General:  Well nourished, well developed, elderly female, somnolent but arouses to verbal, in no acute distress HEENT: normal for age Lymph: no adenopathy Neck: no JVD Endocrine:  No thryomegaly Vascular: No carotid bruits; 4/4 extremity pulses 2+, without bruits  Cardiac:  normal S1, S2; irregular rate and rhythm; soft murmur  Lungs:  clear to auscultation bilaterally, no wheezing, rhonchi or rales  Abd: soft, nontender, no hepatomegaly  Ext: no edema Musculoskeletal:  No deformities, BUE and BLE strength normal and equal Skin: warm and dry  Neuro:  CNs 2-12 intact, no focal abnormalities noted Psych:  Normal affect   EKG:  The EKG was personally reviewed and demonstrates: Coarse atrial fib, heart rate 91, no acute ischemic changes Telemetry:  Telemetry was personally reviewed and demonstrates: Atrial fib, rate  generally controlled.  Relevant CV Studies:  ECHO: (10/11/2017 done but not read) ECHO: 03/02/2017 - Left ventricle: The cavity size was normal. Wall thickness was   increased in a pattern of mild LVH. Systolic function was normal.   The estimated ejection fraction was in the range of 50% to 55%.   Wall motion was normal; there were no regional wall motion   abnormalities. - Aortic valve: Transvalvular velocity was within the normal range.   There was no stenosis. There was no regurgitation. - Mitral valve: There was no regurgitation. - Left atrium: The atrium was severely dilated. - Right ventricle: The cavity size was normal. Wall thickness was   normal. Systolic function was normal. - Right atrium: The atrium was moderately dilated. - Atrial septum: No defect or patent foramen ovale was identified. - Tricuspid valve: There was moderate regurgitation. - Pulmonary arteries: Systolic pressure was moderately increased.   PA peak pressure: 52 mm Hg (S). - Pericardium, extracardiac: A trivial pericardial effusion was   identified.  CATH: N/A  Laboratory  Data:  Chemistry Recent Labs  Lab 10/10/17 1930  NA 134*  K 4.0  CL 96*  CO2 26  GLUCOSE 351*  BUN 36*  CREATININE 1.26*  CALCIUM 9.1  GFRNONAA 38*  GFRAA 44*  ANIONGAP 12    Lab Results  Component Value Date   ALT 11 (L) 10/10/2017   AST 18 10/10/2017   ALKPHOS 122 10/10/2017   BILITOT 0.3 10/10/2017   Hematology Recent Labs  Lab 10/10/17 1930  WBC 4.7  RBC 3.85*  HGB 10.1*  HCT 32.5*  MCV 84.4  MCH 26.2  MCHC 31.1  RDW 16.4*  PLT 223   Cardiac EnzymesNo results for input(s): TROPONINI in the last 168 hours.  Recent Labs  Lab 10/10/17 1936  TROPIPOC 0.00    BNP Recent Labs  Lab 10/10/17 1930  BNP 164.8*    DDimer  Recent Labs  Lab 10/10/17 2107  DDIMER 0.49    Radiology/Studies:  Ct Abdomen Pelvis Wo Contrast  Result Date: 10/11/2017 CLINICAL DATA:  84 year old female with abdominal pain. Concern for diverticulitis. EXAM: CT ABDOMEN AND PELVIS WITHOUT CONTRAST TECHNIQUE: Multidetector CT imaging of the abdomen and pelvis was performed following the standard protocol without IV contrast. COMPARISON:  CT of the abdomen pelvis dated 02/28/2017 FINDINGS: Evaluation of this exam is limited in the absence of intravenous contrast. Evaluation is also limited due to streak artifact caused by patient's arms. Lower chest: There is mild cardiomegaly and coronary vascular calcification. Minimal bibasilar atelectatic changes. No intra-abdominal free air or free fluid Hepatobiliary: No focal liver abnormality is seen. No gallstones, gallbladder wall thickening, or biliary dilatation. Pancreas: Unremarkable. No pancreatic ductal dilatation or surrounding inflammatory changes. Spleen: Normal in size without focal abnormality. Adrenals/Urinary Tract: The adrenal glands are unremarkable. There is a 3 mm nonobstructing left renal interpolar or parenchymal calculus. No hydronephrosis. Bilateral renovascular calcifications noted. There is no hydronephrosis or obstructing stone  on the right. The visualized ureters and urinary bladder appear unremarkable. Stomach/Bowel: There is sigmoid diverticulosis without active inflammatory changes. Moderate amount of dense stool noted in the rectosigmoid. There is no bowel obstruction or active inflammation. Normal appendix. Vascular/Lymphatic: Advanced aortoiliac atherosclerotic disease. No aneurysm. Evaluation of the vasculature is limited in the absence of intravenous contrast. No portal venous gas. There is no adenopathy. Reproductive: Hysterectomy.  No pelvic mass. Other: None Musculoskeletal: Osteopenia with degenerative changes of the spine and old compression deformities of superior endplates of L2  and L4. No acute fracture. IMPRESSION: 1. No acute intra-abdominal or pelvic pathology. Sigmoid diverticulosis. No bowel obstruction or active inflammation. Normal appendix. 2.  Aortic Atherosclerosis (ICD10-I70.0). Electronically Signed   By: Anner Crete M.D.   On: 10/11/2017 03:49   Dg Chest 2 View  Result Date: 10/10/2017 CLINICAL DATA:  Chest pain, shortness of Breath EXAM: CHEST  2 VIEW COMPARISON:  02/28/2017 FINDINGS: Cardiomegaly. No confluent airspace opacities, effusions or edema. No acute bony abnormality. IMPRESSION: Cardiomegaly.  No active disease. Electronically Signed   By: Rolm Baptise M.D.   On: 10/10/2017 20:46    Assessment and Plan:   Principal Problem: 1.  Chest pain -ECG with no acute ischemic changes and initial enzymes negative for MI -Patient appears comfortable now -Multiple chronic medical conditions and functional capacity is poor at baseline -Recheck troponin, if still negative, MI has been ruled out -Not appropriate for aspirin because of anticoagulation with Xarelto. -I am reluctant to add a beta-blocker because her heart rate is well controlled on the Cardizem CD 240 mg daily -I hesitate to add Imdur to her because her blood pressure is well controlled on current therapy and I do not wish her  blood pressure to drop too low -If repeat enzymes are elevated, consider further evaluation and add statin. -Otherwise, if repeat troponin is negative would treat symptomatically  Otherwise, per IM Active Problems:   Hypothyroidism   History of CVA in adulthood   Dementia   Diabetes mellitus type 2, insulin dependent ([place])   Current use of long term anticoagulation   Permanent atrial fibrillation ([facility])   Chronic diastolic CHF (congestive heart failure) ([place])     For questions or updates, please contact [facility] Please consult [email or web address] for contact info under Cardiology/STEMI.   Signed, Rosaria Ferries, PA-C  10/11/2017 11:50 AM   I have examined the patient and reviewed assessment and plan and discussed with patient.  Agree with above as stated.  Would not plan any w/u for ischemia at this time.  Check troponins.  She has multiple medical issues including dementia and is apparently wheelchair bound.  No ischemia by ECG.  AFib rate controlled.  Anticoagulation for stroke prevention.  If angina recurs consistently, could start Imdur.    Larae Grooms

## 2017-10-11 NOTE — Plan of Care (Signed)
  Education: Knowledge of General Education information will improve 10/11/2017 1158 - Progressing by Rolm Baptise, RN   Clinical Measurements: Will remain free from infection 10/11/2017 1158 - Progressing by Rolm Baptise, RN Diagnostic test results will improve 10/11/2017 1158 - Progressing by Rolm Baptise, RN Note New orders for troponin Respiratory complications will improve 10/11/2017 1158 - Progressing by Rolm Baptise, RN Cardiovascular complication will be avoided 10/11/2017 1158 - Progressing by Rolm Baptise, RN   Clinical Measurements: Cardiovascular complication will be avoided 10/11/2017 1158 - Progressing by Rolm Baptise, RN   Activity: Risk for activity intolerance will decrease 10/11/2017 1158 - Progressing by Rolm Baptise, RN Note Bed wheel chair bound per patient very sleepy   Nutrition: Adequate nutrition will be maintained 10/11/2017 1158 - Progressing by Rolm Baptise, RN Note Eating greater than 50% of meal

## 2017-10-11 NOTE — H&P (Addendum)
History and Physical    Joanna Hill ZDG:[id number] DOB: 09-19-1931 DOA: 10/10/2017  PCP: Seward Carol, MD  Patient coming from: [facility] skilled nursing facility.  Chief Complaint: Chest pain.  HPI: Joanna Hill is a 84 y.o. female with history of stroke wheelchair-bound, diastolic CHF last EF measured was in March 03 1849-55% on Lasix, A. fib on Xarelto, hypothyroidism history of breast cancer and diabetes mellitus was brought to the ER after patient complained of chest pain.  Patient states patient was going to have dinner using her wheelchair when she started developing chest pressure.  Lasted for a few minutes.  Started coming back again and was given sublingual nitroglycerin following which patient chest pressure improved.  Had mild shortness of breath denies any productive cough fever or chills.  Has been having some neck pain and lower abdominal pain for last few days.  Denies nausea vomiting or diarrhea.  Has some constipation.  ED Course: In the ER patient is chest pain-free EKG shows A. fib.  Troponin was negative.  D-dimer is negative.  UA still pending.  Creatinine is mildly elevated from baseline.  On exam patient has bilateral lower extremity edema and left second toe looks slightly discolored.  Patient also is being worked up by allergy specialist for skin pruritus.  Review of Systems: As per HPI, rest all negative.   Past Medical History:  Diagnosis Date  . Atrial fibrillation with RVR ([place]) 02/28/2017  . Blindness   . Cancer ([place])    breast  . Carotid artery occlusion   . Dementia   . Diabetes mellitus   . GERD (gastroesophageal reflux disease)   . Hyperlipidemia   . Hypertension   . Hypothyroidism   . Knee injury 2018  . Stroke [facility]) 2009 and  2011   difficulty with swallowing    Past Surgical History:  Procedure Laterality Date  . ABDOMINAL HYSTERECTOMY    . Arch Aortogram  03-15-2010  . MASTECTOMY, PARTIAL Right   . [place]     reports that  has never smoked. She has quit using smokeless tobacco. Her smokeless tobacco use included snuff. She reports that she does not drink alcohol or use drugs.  No Known Allergies  Family History  Problem Relation Age of Onset  . Cancer Mother   . Heart disease Father        Heart disease before age 43  . Asthma Sister   . Diabetes Sister   . Breast cancer Sister   . Diabetes Sister   . Diabetes Sister   . Diabetes Sister   . Diabetes Sister   . Diabetes Sister   . Diabetes Sister   . Diabetes Sister   . Diabetes Sister     Prior to Admission medications   Medication Sig Start Date End Date Taking? Authorizing Provider  Bepotastine Besilate (BEPREVE) 1.5 % SOLN Place 1 drop into both eyes daily.   Yes [provider]  cetirizine (ZYRTEC) 10 MG tablet Take 1 tablet (10 mg total) by mouth 2 (two) times daily. Patient taking differently: Take 10 mg by mouth daily with breakfast.  05/31/17  Yes Kozlow, Donnamarie Poag, MD  diltiazem (CARDIZEM CD) 240 MG 24 hr capsule Take 240 mg by mouth daily with breakfast.  03/15/17  Yes Strader, [place] M, PA-C  divalproex (DEPAKOTE) 125 MG DR tablet Take 125 mg by mouth at bedtime.   Yes [provider]  donepezil (ARICEPT) 10 MG tablet Take 10  mg by mouth at bedtime.   Yes [provider]  Fluvoxamine Maleate (LUVOX CR) 150 MG CP24 Take 150 mg by mouth at bedtime.   Yes [provider]  furosemide (LASIX) 40 MG tablet Take 1 tablet (40 mg total) daily by mouth. Patient taking differently: Take 40 mg by mouth daily with breakfast.  06/23/17  Yes Minus Breeding, MD  hydrALAZINE (APRESOLINE) 25 MG tablet Take 25 mg by mouth 2 (two) times daily.   Yes [provider]  levothyroxine (SYNTHROID, LEVOTHROID) 100 MCG tablet Take 100 mcg by mouth daily before breakfast.   Yes [provider]  mirtazapine (REMERON) 15 MG tablet Take 15 mg by mouth at bedtime.   Yes [provider]    neomycin-polymyxin-dexameth (MAXITROL) 0.1 % OINT Place 1 application into both eyes at bedtime.   Yes [provider]  pantoprazole (PROTONIX) 40 MG tablet Take 40 mg by mouth daily with breakfast.  11/21/16  Yes [provider]  Polyethyl Glycol-Propyl Glycol (SYSTANE) 0.4-0.3 % GEL ophthalmic gel Place 1 application into both eyes at bedtime.   Yes [provider]  polyethylene glycol (MIRALAX / GLYCOLAX) packet Take 17 g by mouth daily as needed. Patient taking differently: Take 17 g by mouth daily as needed for mild constipation.  03/02/17  Yes Rosita Fire, MD  potassium chloride SA (K-DUR,KLOR-CON) 20 MEQ tablet Take 20 mEq by mouth daily with breakfast.  04/25/17  Yes [provider]  Rivaroxaban (XARELTO) 15 MG TABS tablet Take 15 mg by mouth daily with supper.   Yes [provider]  Soft Lens Products (REFRESH CONTACTS DROPS) SOLN Place 1 drop into both eyes 4 (four) times daily.   Yes [provider]  sucralfate (CARAFATE) 1 g tablet Take 1 g by mouth 4 (four) times daily.  01/20/16  Yes [provider]  traMADol (ULTRAM) 50 MG tablet Take 50 mg every 8 (eight) hours as needed by mouth for moderate pain.    Yes [provider]  traZODone (DESYREL) 50 MG tablet Take 25 mg at bedtime by mouth.  05/24/17  Yes [provider]  triamcinolone cream (KENALOG) 0.5 % Apply 1 application topically 2 (two) times daily. Apply to legs for itching/rash 10/14/16  Yes [provider]  white petrolatum (VASELINE) GEL Apply 1 application topically at bedtime.   Yes [provider]  acetaminophen (TYLENOL) 325 MG tablet Take 650 mg by mouth every 6 (six) hours as needed for mild pain, moderate pain, fever or headache.     [provider]  antiseptic oral rinse (BIOTENE) LIQD 5 mLs by Mouth Rinse route 2 (two) times daily.    [provider]  cyproheptadine (PERIACTIN) 4 MG tablet Take one  tablet twice daily 07/12/16   Kozlow, Donnamarie Poag, MD  diclofenac sodium (VOLTAREN) 1 % GEL Apply 2 g topically as needed (for right hip and thigh pain). May use gel to right hip and thigh as needed for pain.     [provider]  diphenhydrAMINE (BENADRYL) 25 mg capsule Take 25 mg by mouth every 6 (six) hours as needed for itching.     [provider]  docusate (COLACE) 50 MG/5ML liquid Take 100 mg by mouth 2 (two) times daily.    [provider]  Eyelid Cleansers (OCUSOFT LID SCRUB) PADS Apply 1 each topically 2 (two) times daily.    [provider]  fluticasone (FLONASE) 50 MCG/ACT nasal spray Place 1 spray into both  nostrils 2 (two) times daily.     [provider]  gabapentin (NEURONTIN) 300 MG capsule Take 300 mg by mouth 3 (three) times daily.     [provider]  guaiFENesin (MUCINEX) 600 MG 12 hr tablet Take 600 mg by mouth 2 (two) times daily.    [provider]  hydroxypropyl methylcellulose / hypromellose (ISOPTO TEARS / GONIOVISC) 2.5 % ophthalmic solution Place 1 drop into both eyes 4 (four) times daily.    [provider]  insulin aspart (NOVOLOG) 100 UNIT/ML injection Inject 2-11 Units into the skin 4 (four) times daily. BL. MOD. ACCUCHECK WITH S/S USE NOVOLOG INSULIN  101-150 = 2U 151-200= 3U 201-250 = 5U 251-300 = 7U 301-350 = 9U >350 = 11U CALL MD FOR BS.400 OR < 60 Taken at 6am 11:30 am 14:30 and 21:00    [provider]  ketotifen (ZADITOR) 0.025 % ophthalmic solution Place 1 drop into both eyes 2 (two) times daily.     [provider]  levalbuterol Penne Lash) 0.63 MG/3ML nebulizer solution Take 0.63 mg every 6 (six) hours as needed by nebulization for wheezing or shortness of breath.  04/27/17   [provider]  LEVEMIR FLEXTOUCH 100 UNIT/ML Pen Inject 24 Units into the skin daily at 10 pm. 03/02/17   Rosita Fire, MD  magic mouthwash SOLN Take 5 mLs by mouth 2 (two) times  daily.    [provider]  nitroGLYCERIN (NITROSTAT) 0.4 MG SL tablet Place 0.4 mg every 5 (five) minutes as needed under the tongue for chest pain.  04/25/17   [provider]  ranitidine (ZANTAC) 150 MG tablet Take 150 mg by mouth 2 (two) times daily.    [provider]  Saline 0.65 % (Soln) SOLN Place 1 spray into both nostrils every 2 (two) hours as needed (for dry nares).     [provider]    Physical Exam: Vitals:   10/10/17 1904 10/10/17 1907 10/10/17 1945 10/10/17 2000  BP: 121/69  115/60 110/72  Pulse: 92  90 99  Resp: (!) 23  16 15   Temp:  98.4 F (36.9 C)    TempSrc:  Oral    SpO2: 98%  98% 98%      Constitutional: Moderately built and nourished. Vitals:   10/10/17 1904 10/10/17 1907 10/10/17 1945 10/10/17 2000  BP: 121/69  115/60 110/72  Pulse: 92  90 99  Resp: (!) 23  16 15   Temp:  98.4 F (36.9 C)    TempSrc:  Oral    SpO2: 98%  98% 98%   Eyes: Anicteric no pallor. ENMT: No discharge from the ears eyes nose or mouth. Neck: No JVD appreciated no mass felt. Respiratory: No rhonchi or crepitations. Cardiovascular: S1-S2 heard no murmurs appreciated. Abdomen: Soft nontender bowel sounds present. Musculoskeletal: Bilateral lower extremity edema.  Discoloration of the left second toe. Skin: Discoloration of his left second toe. Neurologic: Alert awake oriented to time place and person.  Moves all extremities. Psychiatric: Appears normal.  Normal affect.   Labs on Admission: I have personally reviewed following labs and imaging studies  CBC: Recent Labs  Lab 10/10/17 1930  WBC 4.7  NEUTROABS 3.0  HGB 10.1*  HCT 32.5*  MCV 84.4  PLT 762   Basic Metabolic Panel: Recent Labs  Lab 10/10/17 1930  NA 134*  K 4.0  CL 96*  CO2 26  GLUCOSE 351*  BUN 36*  CREATININE 1.26*  CALCIUM 9.1   GFR: CrCl  cannot be calculated (Unknown ideal weight.). Liver Function Tests: Recent Labs  Lab 10/10/17 1930  AST 18  ALT  11*  ALKPHOS 122  BILITOT 0.3  PROT 6.5  ALBUMIN 3.1*   No results for input(s): LIPASE, AMYLASE in the last 168 hours. No results for input(s): AMMONIA in the last 168 hours. Coagulation Profile: No results for input(s): INR, PROTIME in the last 168 hours. Cardiac Enzymes: No results for input(s): CKTOTAL, CKMB, CKMBINDEX, TROPONINI in the last 168 hours. BNP (last 3 results) No results for input(s): PROBNP in the last 8760 hours. HbA1C: No results for input(s): HGBA1C in the last 72 hours. CBG: No results for input(s): GLUCAP in the last 168 hours. Lipid Profile: No results for input(s): CHOL, HDL, LDLCALC, TRIG, CHOLHDL, LDLDIRECT in the last 72 hours. Thyroid Function Tests: No results for input(s): TSH, T4TOTAL, FREET4, T3FREE, THYROIDAB in the last 72 hours. Anemia Panel: No results for input(s): VITAMINB12, FOLATE, FERRITIN, TIBC, IRON, RETICCTPCT in the last 72 hours. Urine analysis:    Component Value Date/Time   COLORURINE YELLOW 02/28/2017 [place] 02/28/2017 1317   LABSPEC 1.016 02/28/2017 1317   PHURINE 5.0 02/28/2017 1317   GLUCOSEU 150 (A) 02/28/2017 1317   HGBUR NEGATIVE 02/28/2017 1317   BILIRUBINUR NEGATIVE 02/28/2017 1317   KETONESUR NEGATIVE 02/28/2017 1317   PROTEINUR NEGATIVE 02/28/2017 1317   UROBILINOGEN 0.2 07/23/2013 1230   NITRITE NEGATIVE 02/28/2017 1317   LEUKOCYTESUR NEGATIVE 02/28/2017 1317   Sepsis Labs: @LABRCNTIP (procalcitonin:4,lacticidven:4) )No results found for this or any previous visit (from the past 240 hour(s)).   Radiological Exams on Admission: Dg Chest 2 View  Result Date: 10/10/2017 CLINICAL DATA:  Chest pain, shortness of Breath EXAM: CHEST  2 VIEW COMPARISON:  02/28/2017 FINDINGS: Cardiomegaly. No confluent airspace opacities, effusions or edema. No acute bony abnormality. IMPRESSION: Cardiomegaly.  No active disease. Electronically Signed   By: Rolm Baptise M.D.   On: 10/10/2017 20:46    EKG:  Independently reviewed.  A. fib rate controlled.  Nonspecific ST-T changes.  Assessment/Plan Principal Problem:   Chest pain Active Problems:   Hypothyroidism   History of CVA in adulthood   Dementia   Diabetes mellitus type 2, insulin dependent (HCC)   Current use of long term anticoagulation   Permanent atrial fibrillation (HCC)   Chronic diastolic CHF (congestive heart failure) ([place])    1. Chest pain -given that the patient symptoms happened while she was trying to push the wheelchair concerning for angina.  Chest pain improved with sublingual nitroglycerin.  We will cycle cardiac markers check 2D echo consult cardiology in a.m.  Patient is already on Xarelto.  Will keep patient on PRN nitroglycerin. 2. Abdominal discomfort -patient is complaining of lower abdominal discomfort.  UA still pending.  CT abdomen has been ordered. 3. Uncontrolled diabetes mellitus type 2 -patient is due for her Levemir which I have ordered now.  We will closely follow CBGs with sliding scale coverage.  Follow metabolic panel. 4. Chronic diastolic CHF last EF measured in July 2018 was 50-55% on Lasix.  We will continue Lasix for now noted that patient's renal function has slightly worsened from previous. 5. Acute renal failure -UA is pending.  Closely follow metabolic panel since patient is also on Lasix. 6. History of dementia on Aricept. 7. Hypothyroidism on Synthroid. 8. History of stroke on Xarelto. 9. History of breast cancer in remission. 10. Anemia normocytic normochromic appears to be chronic.  Follow CBC. 11. History of skin  pruritus being followed by allergy specialist.  Patient has some discoloration of the left leg second toe.  Pulses are intact.  Closely observe.  Denies any pain.   DVT prophylaxis: Xarelto. Code Status: Full code. Family Communication: No family at the bedside. Disposition Plan: Skilled nursing facility. Consults called: Cardiology. Admission status:  Observation.   Rise Patience MD [facility] Pager [phone].  If 7PM-7AM, please contact night-coverage [email or web address] Password [facility]  10/11/2017, 12:16 AM

## 2017-10-11 NOTE — Progress Notes (Signed)
   10/11/17 0057  Vitals  Temp (!) 97.5 F (36.4 C)  Temp Source Oral  BP 117/61  BP Location Left Arm  BP Method Automatic  Patient Position (if appropriate) Lying  Pulse Rate 79  Pulse Rate Source Dinamap  Resp 18  Oxygen Therapy  SpO2 97 %  O2 Device Room Air  Height and Weight  Height 5\' 4"  (1.626 m)  Weight 61.6 kg (135 lb 11.2 oz)  BSA (Calculated - sq m) 1.67 sq meters  BMI (Calculated) 23.28  Weight in (lb) to have BMI = 25 145.3  Admitted pt to rm 3E20 from ED, pt alert and oriented, denied pain at this time, oriented to room, call bell placed within reach. Educated on Patient's safety plan with pt's understanding. Placed on cardiac monitor, CCMD made aware.

## 2017-10-11 NOTE — Clinical Social Work Note (Signed)
Clinical Social Work Assessment  Patient Details  Name: Joanna Hill MRN: [id number] Date of Birth: Apr 05, 1932  Date of referral:  10/11/17               Reason for consult:  Discharge Planning                Permission sought to share information with:  Facility Art therapist granted to share information::     Name::        Agency::  [facility]  Relationship::     Contact Information:     Housing/Transportation Living arrangements for the past 2 months:  [place] of Information:  Patient, Medical Team, Facility Patient Interpreter Needed:  None Criminal Activity/Legal Involvement Pertinent to Current Situation/Hospitalization:  No - Comment as needed Significant Relationships:  Adult Children Lives with:  Facility Resident Do you feel safe going back to the place where you live?  Yes Need for family participation in patient care:  Yes (Comment)  Care giving concerns:  Patient is a long-term resident at [facility] SNF.   Social Worker assessment / plan:  CSW met with patient. No supports at bedside. CSW introduced role and explained that discharge planning would be discussed. Patient confirmed she was admitted from [facility]. She stated she has been there for 11 years and plans to return once stable for discharge. No further concerns. CSW encouraged patient to contact CSW as needed. CSW will continue to follow patient for support and facilitate discharge back to SNF once medically stable.  Employment status:  Retired Forensic scientist:  Medicare PT Recommendations:  Not assessed at this time [place] / Referral to community resources:  [place]  Patient/Family's Response to care:  Patient agreeable to return to SNF. Patient's family supportive and involved in patient's care. Patient appreciated social work intervention.  Patient/Family's Understanding of and Emotional Response to Diagnosis, Current  Treatment, and Prognosis:  Patient has a good understanding of the reason for admission and plan to return to SNF once medically stable for discharge. Patient appears happy with hospital care.  Emotional Assessment Appearance:  Appears stated age Attitude/Demeanor/Rapport:  Engaged, Gracious Affect (typically observed):  Accepting, Appropriate, Calm, Pleasant Orientation:  Oriented to Self, Oriented to Place, Oriented to  Time, Oriented to Situation Alcohol / Substance use:  Never Used Psych involvement (Current and /or in the community):  No (Comment)  Discharge Needs  Concerns to be addressed:  Care Coordination Readmission within the last 30 days:  No Current discharge risk:  None Barriers to Discharge:  Continued Medical Work up   Candie Chroman, LCSW 10/11/2017, 2:32 PM

## 2017-10-12 DIAGNOSIS — R0789 Other chest pain: Secondary | ICD-10-CM | POA: Diagnosis not present

## 2017-10-12 DIAGNOSIS — Z794 Long term (current) use of insulin: Secondary | ICD-10-CM | POA: Diagnosis not present

## 2017-10-12 DIAGNOSIS — E039 Hypothyroidism, unspecified: Secondary | ICD-10-CM | POA: Diagnosis not present

## 2017-10-12 DIAGNOSIS — E119 Type 2 diabetes mellitus without complications: Secondary | ICD-10-CM | POA: Diagnosis not present

## 2017-10-12 DIAGNOSIS — I5032 Chronic diastolic (congestive) heart failure: Secondary | ICD-10-CM

## 2017-10-12 DIAGNOSIS — R072 Precordial pain: Secondary | ICD-10-CM | POA: Diagnosis not present

## 2017-10-12 DIAGNOSIS — F0390 Unspecified dementia without behavioral disturbance: Secondary | ICD-10-CM

## 2017-10-12 DIAGNOSIS — I5040 Unspecified combined systolic (congestive) and diastolic (congestive) heart failure: Secondary | ICD-10-CM | POA: Diagnosis not present

## 2017-10-12 LAB — GLUCOSE, CAPILLARY
GLUCOSE-CAPILLARY: 126 mg/dL — AB (ref 65–99)
GLUCOSE-CAPILLARY: 116 mg/dL — AB (ref 65–99)
GLUCOSE-CAPILLARY: 177 mg/dL — AB (ref 65–99)
Glucose-Capillary: 62 mg/dL — ABNORMAL LOW (ref 65–99)
Glucose-Capillary: 156 mg/dL — ABNORMAL HIGH (ref 65–99)

## 2017-10-12 MED ORDER — FLUVOXAMINE MALEATE 100 MG PO TABS
100.0000 mg | ORAL_TABLET | Freq: Every day | ORAL | Status: DC
  Filled 2017-10-12: qty 1

## 2017-10-12 MED ORDER — FAMOTIDINE 20 MG PO TABS: 40.0000 mg | ORAL_TABLET | Freq: Two times a day (BID) | ORAL | Status: DC

## 2017-10-12 MED ORDER — PANTOPRAZOLE SODIUM 40 MG PO TBEC: 40.0000 mg | DELAYED_RELEASE_TABLET | Freq: Two times a day (BID) | ORAL | Status: AC

## 2017-10-12 MED ORDER — FLUVOXAMINE MALEATE 50 MG PO TABS
50.0000 mg | ORAL_TABLET | Freq: Every day | ORAL | Status: DC
  Administered 2017-10-12: 50 mg via ORAL
  Filled 2017-10-12: qty 1

## 2017-10-12 NOTE — Clinical Social Work Note (Addendum)
Patient's daughter-in-law notified of plan to discharge back to SNF tomorrow. She will call SNF admissions coordinator to set up a time to do readmission paperwork.  [place]ton Scrape, [place] [phone]  4:23 pm Per [facility] via medical director for RNCM/CSW dept, patient has no further medical need to be in the hospital. [facility] notified attending MD. SNF admissions coordinator notified and asked that CSW call daughter-in-law to have her come to the facility ASAP to complete paperwork. CSW left her a voicemail.  Dayton Scrape, [place] [phone]  4:39 pm Per admissions coordinator, patient's daughter-in-law will be at the SNF at 5:00 pm to complete paperwork. CSW paged MD to notify that patient can return to facility today.  Dayton Scrape, [place]

## 2017-10-12 NOTE — Care Management Obs Status (Signed)
[place] NOTIFICATION   Patient Details  Name: Joanna Hill MRN: [id number] Date of Birth: 12/12/1931   Medicare Observation Status Notification Given:   patient unable to sign on lap top; Medicare Letter explained to patient in detail , all questions answered.     Royston Bake, RN 10/12/2017, 4:28 PM

## 2017-10-12 NOTE — Progress Notes (Signed)
PROGRESS NOTE    Joanna Hill  OBS:[id number] DOB: July 27, 1932 DOA: 10/10/2017 PCP: Seward Carol, MD   Outpatient Specialists:     Brief Narrative:  Joanna Hill is a 84 y.o. female with history of stroke wheelchair-bound, diastolic CHF last EF measured was in March 03 1849-55% on Lasix, A. fib on Xarelto, hypothyroidism history of breast cancer and diabetes mellitus was brought to the ER after patient complained of chest pain.  Patient states patient was going to have dinner using her wheelchair when she started developing chest pressure.  Lasted for a few minutes.  Started coming back again and was given sublingual nitroglycerin following which patient chest pressure improved.  Had mild shortness of breath denies any productive cough fever or chills.  Has been having some neck pain and lower abdominal pain for last few days.  Denies nausea vomiting or diarrhea.  Has some constipation.  ED Course: In the ER patient is chest pain-free EKG shows A. fib.  Troponin was negative.  D-dimer is negative.  UA still pending.  Creatinine is mildly elevated from baseline.  On exam patient has bilateral lower extremity edema and left second toe looks slightly discolored.  Patient also is being worked up by allergy specialist for skin pruritus.     Assessment & Plan:   Principal Problem:   Chest pain Active Problems:   Hypothyroidism   History of CVA in adulthood   Dementia   Diabetes mellitus type 2, insulin dependent (HCC)   Current use of long term anticoagulation   Permanent atrial fibrillation (HCC)   Chronic diastolic CHF (congestive heart failure) (HCC)   Chest pain  -seen by cardiology: no further intervention required -? GERD-- patient c/o cough and bad taste in mouth-- adjust PPI and monitor -may need to see GI as an outpatient  Abdominal discomfort  -CT scan does not show anything urgent  Dementia -? If pains related to  Dementia/depression behavioral issues -patient given  home meds and was very lethargic  Uncontrolled diabetes mellitus type -SSI -lantus  Chronic diastolic CHF last EF measured in July 2018 was 50-55% on Lasix.   -monitor   Acute renal failure  -U/A negative BMP in AM  Hypothyroidism on Synthroid.  History of stroke on Xarelto.  History of breast cancer in remission.  Anemia normocytic normochromic appears to be chronic  HTN -BP low -will d/c hydralazine PO -monitor for improvement     DVT prophylaxis:  Fully anticoagulated   Code Status: Full Code   Family Communication:   Disposition Plan:  Back to SNF when bed available   Consultants:   cards   Subjective: Much more awake today than yesterday  Objective: Vitals:   10/11/17 1146 10/11/17 1931 10/12/17 0514 10/12/17 1121  BP: (!) 113/50 (!) 110/59 107/66 (!) 105/56  Pulse: 78 81 84   Resp: 20 20 20 16   Temp: 97.7 F (36.5 C) 97.9 F (36.6 C) 97.6 F (36.4 C)   TempSrc: Oral Oral Oral   SpO2: 97% 100% 98% 99%  Weight:   60.8 kg (134 lb 1.6 oz)   Height:        Intake/Output Summary (Last 24 hours) at 10/12/2017 1406 Last data filed at 10/12/2017 0800 Gross per 24 hour  Intake 660 ml  Output 500 ml  Net 160 ml   Filed Weights   10/11/17 0057 10/12/17 0514  Weight: 61.6 kg (135 lb 11.2 oz) 60.8 kg (134 lb 1.6 oz)    Examination:  General exam: chronically ill appearing Respiratory system: no increase work of breathing Cardiovascular system: rrr Gastrointestinal system: +BS, soft Central nervous system: awake, pleasant     Data Reviewed: I have personally reviewed following labs and imaging studies  CBC: Recent Labs  Lab 10/10/17 1930  WBC 4.7  NEUTROABS 3.0  HGB 10.1*  HCT 32.5*  MCV 84.4  PLT 027   Basic Metabolic Panel: Recent Labs  Lab 10/10/17 1930  NA 134*  K 4.0  CL 96*  CO2 26  GLUCOSE 351*  BUN 36*  CREATININE 1.26*  CALCIUM 9.1   GFR: Estimated Creatinine Clearance: 29.2 mL/min (A) (by C-G formula  based on SCr of 1.26 mg/dL (H)). Liver Function Tests: Recent Labs  Lab 10/10/17 1930  AST 18  ALT 11*  ALKPHOS 122  BILITOT 0.3  PROT 6.5  ALBUMIN 3.1*   No results for input(s): LIPASE, AMYLASE in the last 168 hours. No results for input(s): AMMONIA in the last 168 hours. Coagulation Profile: No results for input(s): INR, PROTIME in the last 168 hours. Cardiac Enzymes: Recent Labs  Lab 10/11/17 1212  TROPONINI <0.03   BNP (last 3 results) No results for input(s): PROBNP in the last 8760 hours. HbA1C: No results for input(s): HGBA1C in the last 72 hours. CBG: Recent Labs  Lab 10/11/17 2103 10/12/17 0153 10/12/17 0237 10/12/17 0756 10/12/17 1153  GLUCAP 111* 62* 126* 116* 156*   Lipid Profile: No results for input(s): CHOL, HDL, LDLCALC, TRIG, CHOLHDL, LDLDIRECT in the last 72 hours. Thyroid Function Tests: No results for input(s): TSH, T4TOTAL, FREET4, T3FREE, THYROIDAB in the last 72 hours. Anemia Panel: No results for input(s): VITAMINB12, FOLATE, FERRITIN, TIBC, IRON, RETICCTPCT in the last 72 hours. Urine analysis:    Component Value Date/Time   COLORURINE YELLOW 10/11/2017 0205   APPEARANCEUR CLEAR 10/11/2017 0205   LABSPEC 1.014 10/11/2017 0205   PHURINE 5.0 10/11/2017 0205   GLUCOSEU NEGATIVE 10/11/2017 0205   HGBUR NEGATIVE 10/11/2017 0205   BILIRUBINUR NEGATIVE 10/11/2017 0205   KETONESUR NEGATIVE 10/11/2017 0205   PROTEINUR NEGATIVE 10/11/2017 0205   UROBILINOGEN 0.2 07/23/2013 1230   NITRITE NEGATIVE 10/11/2017 0205   LEUKOCYTESUR SMALL (A) 10/11/2017 0205     Recent Results (from the past 240 hour(s))  MRSA PCR Screening     Status: None   Collection Time: 10/11/17  1:15 AM  Result Value Ref Range Status   MRSA by PCR NEGATIVE NEGATIVE Final    Comment:        The GeneXpert MRSA Assay (FDA approved for NASAL specimens only), is one component of a comprehensive MRSA colonization surveillance program. It is not intended to diagnose  MRSA infection nor to guide or monitor treatment for MRSA infections. Performed at [facility], [place] [address] [place], [place] [address]       Anti-infectives (From admission, onward)   None       Radiology Studies: Ct Abdomen Pelvis Wo Contrast  Result Date: 10/11/2017 CLINICAL DATA:  84 year old female with abdominal pain. Concern for diverticulitis. EXAM: CT ABDOMEN AND PELVIS WITHOUT CONTRAST TECHNIQUE: Multidetector CT imaging of the abdomen and pelvis was performed following the standard protocol without IV contrast. COMPARISON:  CT of the abdomen pelvis dated 02/28/2017 FINDINGS: Evaluation of this exam is limited in the absence of intravenous contrast. Evaluation is also limited due to streak artifact caused by patient's arms. Lower chest: There is mild cardiomegaly and coronary vascular calcification. Minimal bibasilar atelectatic changes. No intra-abdominal free air  or free fluid Hepatobiliary: No focal liver abnormality is seen. No gallstones, gallbladder wall thickening, or biliary dilatation. Pancreas: Unremarkable. No pancreatic ductal dilatation or surrounding inflammatory changes. Spleen: Normal in size without focal abnormality. Adrenals/Urinary Tract: The adrenal glands are unremarkable. There is a 3 mm nonobstructing left renal interpolar or parenchymal calculus. No hydronephrosis. Bilateral renovascular calcifications noted. There is no hydronephrosis or obstructing stone on the right. The visualized ureters and urinary bladder appear unremarkable. Stomach/Bowel: There is sigmoid diverticulosis without active inflammatory changes. Moderate amount of dense stool noted in the rectosigmoid. There is no bowel obstruction or active inflammation. Normal appendix. Vascular/Lymphatic: Advanced aortoiliac atherosclerotic disease. No aneurysm. Evaluation of the vasculature is limited in the absence of intravenous contrast. No portal venous gas. There is no adenopathy.  Reproductive: Hysterectomy.  No pelvic mass. Other: None Musculoskeletal: Osteopenia with degenerative changes of the spine and old compression deformities of superior endplates of L2 and L4. No acute fracture. IMPRESSION: 1. No acute intra-abdominal or pelvic pathology. Sigmoid diverticulosis. No bowel obstruction or active inflammation. Normal appendix. 2.  Aortic Atherosclerosis (ICD10-I70.0). Electronically Signed   By: Anner Crete M.D.   On: 10/11/2017 03:49   Dg Chest 2 View  Result Date: 10/10/2017 CLINICAL DATA:  Chest pain, shortness of Breath EXAM: CHEST  2 VIEW COMPARISON:  02/28/2017 FINDINGS: Cardiomegaly. No confluent airspace opacities, effusions or edema. No acute bony abnormality. IMPRESSION: Cardiomegaly.  No active disease. Electronically Signed   By: Rolm Baptise M.D.   On: 10/10/2017 20:46        Scheduled Meds: . artificial tears  1 application Both Eyes QHS  . cyproheptadine  4 mg Oral BID  . diltiazem  240 mg Oral Q breakfast  . divalproex  125 mg Oral QHS  . docusate sodium  100 mg Oral BID  . donepezil  10 mg Oral QHS  . famotidine  40 mg Oral BID  . fluticasone  1 spray Each Nare BID  . fluvoxaMINE  100 mg Oral QHS  . fluvoxaMINE  50 mg Oral Daily  . furosemide  40 mg Oral Q breakfast  . guaiFENesin  600 mg Oral BID  . hydrALAZINE  25 mg Oral BID  . hydroxypropyl methylcellulose / hypromellose  1 drop Both Eyes QID  . insulin aspart  0-9 Units Subcutaneous TID WC  . insulin detemir  24 Units Subcutaneous Q2200  . levothyroxine  100 mcg Oral QAC breakfast  . loratadine  10 mg Oral Daily  . mirtazapine  15 mg Oral QHS  . olopatadine  1 drop Both Eyes BID  . pantoprazole  40 mg Oral Q breakfast  . potassium chloride SA  20 mEq Oral Q breakfast  . Rivaroxaban  15 mg Oral Q supper  . sucralfate  1 g Oral QID  . triamcinolone cream  1 application Topical BID   Continuous Infusions:   LOS: 0 days    Time spent: 25 min    Geradine Girt,  DO [facility] Pager [phone]  If 7PM-7AM, please contact night-coverage [email or web address] Password [facility] 10/12/2017, 2:06 PM

## 2017-10-12 NOTE — Evaluation (Signed)
Physical Therapy Evaluation Patient Details Name: Joanna Hill MRN: [id number] DOB: August 06, 1932 Today's Date: 10/12/2017   History of Present Illness  84 y.o. female admitted on 10/10/17 for chest pain from [facility] where she is a long term resident.  Cardiology consulted to r/o MI.  Acute cardiac event ruled out. UA was negative and CT of abdomen does not "show anything urgent".  Pt with significant PMH of stroke with R sided weakness and difficulty swallowing, PAF, HTN, DM, dementia, carotid artery occlusion, CA s/p R partial mastectomy, blindness, A-fib with RVR, and L RTC repair.  Clinical Impression  Pt is likely pretty close to her baseline which is transfers to [facility] (although, normally unassisted) and wheeling about the SNF to visit friends.  PT will follow acutely to try to ensure she gets back to un assisted transfers and facilitate WC mobility in the hallway.   PT to follow acutely for deficits listed below.       Follow Up Recommendations SNF(from [facility])    Equipment Recommendations  None recommended by PT    Recommendations for Other Services   NA    Precautions / Restrictions Precautions Precautions: Fall Restrictions Weight Bearing Restrictions: No      Mobility  Bed Mobility Overal bed mobility: Modified Independent             General bed mobility comments: Pt able to get EOB with HOB elevated and use of the rail and same with getting back into the bed.  She even was able to use both hands to pull herself up in the bed.   Transfers Overall transfer level: Needs assistance Equipment used: None Transfers: Sit to/from [facility] Sit to Stand: Mod assist;Min assist Stand pivot transfers: Mod assist;Min assist       General transfer comment: Mod assist to transfer from bed to [facility] on her right, min assist to transfer from [facility] back to bed on her left.  Pt did much better standing from [facility] with two armrests than from bed with none.   She was able to preform her own pericare from sitting with her right hand (weaker hand).    Ambulation/Gait             General Gait Details: Pt is non ambulatory at baseline, so NT.       Balance Overall balance assessment: Needs assistance Sitting-balance support: Feet supported;No upper extremity supported Sitting balance-Leahy Scale: Good Sitting balance - Comments: Pt sat EOB using both hands to brush her hair.    Standing balance support: Bilateral upper extremity supported Standing balance-Leahy Scale: Poor                               Pertinent Vitals/Pain Pain Assessment: No/denies pain    Home Living Family/patient expects to be discharged to:: Skilled nursing facility(from [facility])                      Prior Function Level of Independence: Needs assistance   Gait / Transfers Assistance Needed: Pt reports she is normally able to get into and out of her WC on her own and wheel around with her feet.  She is not active in therapy and enjoys visiting with friends.            Hand Dominance   Dominant Hand: Right    Extremity/Trunk Assessment   Upper Extremity Assessment Upper Extremity Assessment: Defer  to OT evaluation    Lower Extremity Assessment Lower Extremity Assessment: RLE deficits/detail RLE Deficits / Details: right leg is weaker than left with h/o stroke.  Functionally it was much harder to transfer to her right side than it was back to her left side.      Cervical / Trunk Assessment Cervical / Trunk Assessment: Kyphotic  Communication   Communication: No difficulties  Cognition Arousal/Alertness: Lethargic Behavior During Therapy: WFL for tasks assessed/performed Overall Cognitive Status: History of cognitive impairments - at baseline                                 General Comments: h/o dementia and stroke             Assessment/Plan    PT Assessment Patient needs continued PT  services  PT Problem List Decreased strength;Decreased activity tolerance;Decreased mobility;Decreased balance;Decreased knowledge of use of DME;Decreased cognition       PT Treatment Interventions DME instruction;Functional mobility training;Therapeutic activities;Balance training;Therapeutic exercise;Neuromuscular re-education;Patient/family education    PT Goals (Current goals can be found in the Care Plan section)  Acute Rehab PT Goals Patient Stated Goal: to go back to [facility] PT Goal Formulation: With patient Time For Goal Achievement: 10/26/17 Potential to Achieve Goals: Good    Frequency Min 2X/week           AM-PAC PT "6 Clicks" Daily Activity  Outcome Measure Difficulty turning over in bed (including adjusting bedclothes, sheets and blankets)?: None Difficulty moving from lying on back to sitting on the side of the bed? : None Difficulty sitting down on and standing up from a chair with arms (e.g., wheelchair, bedside commode, etc,.)?: Unable Help needed moving to and from a bed to chair (including a wheelchair)?: A Lot Help needed walking in hospital room?: Total Help needed climbing 3-5 steps with a railing? : Total 6 Click Score: 13    End of Session   Activity Tolerance: Patient tolerated treatment well Patient left: in bed;with call bell/phone within reach;with bed alarm set Nurse Communication: Mobility status PT Visit Diagnosis: Difficulty in walking, not elsewhere classified (R26.2);Muscle weakness (generalized) (M62.81)    Time: 2876-8115 PT Time Calculation (min) (ACUTE ONLY): 18 min   Charges:       Wells Guiles B. Lameshia Hypolite, PT, DPT [phone]   PT Evaluation $PT Eval Moderate Complexity: 1 Mod     10/12/2017, 2:31 PM

## 2017-10-12 NOTE — Plan of Care (Signed)
  Education: Knowledge of General Education information will improve 10/12/2017 0817 - Progressing by Rolm Baptise, RN   Activity: Risk for activity intolerance will decrease 10/12/2017 0817 - Progressing by Rolm Baptise, RN Note Max assist to bedside commode and chair   Health Behavior/Discharge Planning: Ability to manage health-related needs will improve 10/12/2017 0817 - Progressing by Rolm Baptise, RN   Activity: Risk for activity intolerance will decrease 10/12/2017 0817 - Progressing by Rolm Baptise, RN Note Max assist to bedside commode and chair   Activity: Risk for activity intolerance will decrease 10/12/2017 0817 - Progressing by Rolm Baptise, RN Note Max assist to bedside commode and chair   Nutrition: Adequate nutrition will be maintained 10/12/2017 0817 by Rolm Baptise, RN Note Adequate eating > 50% of meal   Elimination: Will not experience complications related to bowel motility 10/12/2017 0817 by Rolm Baptise, RN Note [place] in place    Pain Managment: General experience of comfort will improve 10/12/2017 0817 by Rolm Baptise, RN Note Heart burn gave protonix   Pain Managment: General experience of comfort will improve 10/12/2017 0817 by Rolm Baptise, RN Note Heart burn gave protonix   Safety: Ability to remain free from injury will improve 10/12/2017 0817 by Rolm Baptise, RN Note Bed alarm on   Skin Integrity: Risk for impaired skin integrity will decrease 10/12/2017 0817 by Rolm Baptise, RN Note Skin intact driscolored lower extremities

## 2017-10-12 NOTE — Clinical Social Work Note (Signed)
CSW facilitated patient discharge including contacting patient family (left patient's daughter-in-law a Advertising account executive. SNF will notify her when she arrives to complete paperwork) and facility to confirm patient discharge plans. Clinical information faxed to facility and family agreeable with plan. CSW arranged ambulance transport via [facility] to [facility] at 5:30 pm. RN to call report prior to discharge [phone] Room 603).  CSW will sign off for now as social work intervention is no longer needed. Please consult [place] again if new needs arise.  [place] Scrape, [place]

## 2017-10-12 NOTE — Progress Notes (Signed)
Patient alert and oriented d.c to snf, tele and iv out. Calling report to [place].

## 2017-10-12 NOTE — Discharge Summary (Signed)
Physician Discharge Summary  [name]:[id number] DOB: 1932/05/21 DOA: 10/10/2017  PCP: Seward Carol, MD  Admit date: 10/10/2017 Discharge date: 10/12/2017   Recommendations for Outpatient Follow-Up:   1. Limit sedating meds 2. Outpatient GI referral if medication adjustments do not help acid reflux (GERD diet) 3. Consider pallaitive care referral  4. BMP/CBC 1 week   Discharge Diagnosis:   Principal Problem:   Chest pain Active Problems:   Hypothyroidism   History of CVA in adulthood   Dementia   GERD (gastroesophageal reflux disease)   Diabetes mellitus type 2, insulin dependent (HCC)   Current use of long term anticoagulation   Permanent atrial fibrillation (HCC)   Chronic diastolic CHF (congestive heart failure) ([place])   Discharge disposition:  SNF:  Discharge Condition: Improved.  Diet recommendation: Low sodium, heart healthy.  Carbohydrate-modified.  Wound care: None.   History of Present Illness:   Joanna Hill is a 84 y.o. female with history of stroke wheelchair-bound, diastolic CHF last EF measured was in March 03 1849-55% on Lasix, A. fib on Xarelto, hypothyroidism history of breast cancer and diabetes mellitus was brought to the ER after patient complained of chest pain.  Patient states patient was going to have dinner using her wheelchair when she started developing chest pressure.  Lasted for a few minutes.  Started coming back again and was given sublingual nitroglycerin following which patient chest pressure improved.  Had mild shortness of breath denies any productive cough fever or chills.  Has been having some neck pain and lower abdominal pain for last few days.  Denies nausea vomiting or diarrhea.  Has some constipation.     Hospital Course by Problem:   Chest pain -seen by cardiology: no further intervention required-- If angina recurs consistently, could start Imdur -? GERD-- patient c/o cough and bad taste in mouth-- adjust PPI and  monitor -may need to see GI as an outpatient  Abdominal discomfort -CT scan does not show anything urgent  Dementia -? If pains related to  Dementia/depression behavioral issues -patient given home meds and was very lethargic  Uncontrolled diabetes mellitus type -SSI -lantus  Chronic diastolic CHF last EF measured in July 2018 was 50-55% on Lasix.  -monitor   Acute renal failure -U/A negative BMP in AM  Hypothyroidism on Synthroid.  History of stroke on Xarelto.  History of breast cancer in remission.  Anemia normocytic normochromic appears to be chronic  HTN -BP low -will d/c hydralazine PO -monitor for improvement      Medical Consultants:    cards   Discharge Exam:   Vitals:   10/12/17 0514 10/12/17 1121  BP: 107/66 (!) 105/56  Pulse: 84   Resp: 20 16  Temp: 97.6 F (36.4 C)   SpO2: 98% 99%   Vitals:   10/11/17 1146 10/11/17 1931 10/12/17 0514 10/12/17 1121  BP: (!) 113/50 (!) 110/59 107/66 (!) 105/56  Pulse: 78 81 84   Resp: 20 20 20 16   Temp: 97.7 F (36.5 C) 97.9 F (36.6 C) 97.6 F (36.4 C)   TempSrc: Oral Oral Oral   SpO2: 97% 100% 98% 99%  Weight:   60.8 kg (134 lb 1.6 oz)   Height:        Gen:  NAD-- belching   The results of significant diagnostics from this hospitalization (including imaging, microbiology, ancillary and laboratory) are listed below for reference.     Procedures and Diagnostic Studies:   Ct Abdomen Pelvis Wo Contrast  Result Date: 10/11/2017 CLINICAL DATA:  84 year old female with abdominal pain. Concern for diverticulitis. EXAM: CT ABDOMEN AND PELVIS WITHOUT CONTRAST TECHNIQUE: Multidetector CT imaging of the abdomen and pelvis was performed following the standard protocol without IV contrast. COMPARISON:  CT of the abdomen pelvis dated 02/28/2017 FINDINGS: Evaluation of this exam is limited in the absence of intravenous contrast. Evaluation is also limited due to streak artifact caused by  patient's arms. Lower chest: There is mild cardiomegaly and coronary vascular calcification. Minimal bibasilar atelectatic changes. No intra-abdominal free air or free fluid Hepatobiliary: No focal liver abnormality is seen. No gallstones, gallbladder wall thickening, or biliary dilatation. Pancreas: Unremarkable. No pancreatic ductal dilatation or surrounding inflammatory changes. Spleen: Normal in size without focal abnormality. Adrenals/Urinary Tract: The adrenal glands are unremarkable. There is a 3 mm nonobstructing left renal interpolar or parenchymal calculus. No hydronephrosis. Bilateral renovascular calcifications noted. There is no hydronephrosis or obstructing stone on the right. The visualized ureters and urinary bladder appear unremarkable. Stomach/Bowel: There is sigmoid diverticulosis without active inflammatory changes. Moderate amount of dense stool noted in the rectosigmoid. There is no bowel obstruction or active inflammation. Normal appendix. Vascular/Lymphatic: Advanced aortoiliac atherosclerotic disease. No aneurysm. Evaluation of the vasculature is limited in the absence of intravenous contrast. No portal venous gas. There is no adenopathy. Reproductive: Hysterectomy.  No pelvic mass. Other: None Musculoskeletal: Osteopenia with degenerative changes of the spine and old compression deformities of superior endplates of L2 and L4. No acute fracture. IMPRESSION: 1. No acute intra-abdominal or pelvic pathology. Sigmoid diverticulosis. No bowel obstruction or active inflammation. Normal appendix. 2.  Aortic Atherosclerosis (ICD10-I70.0). Electronically Signed   By: Anner Crete M.D.   On: 10/11/2017 03:49   Dg Chest 2 View  Result Date: 10/10/2017 CLINICAL DATA:  Chest pain, shortness of Breath EXAM: CHEST  2 VIEW COMPARISON:  02/28/2017 FINDINGS: Cardiomegaly. No confluent airspace opacities, effusions or edema. No acute bony abnormality. IMPRESSION: Cardiomegaly.  No active disease.  Electronically Signed   By: Rolm Baptise M.D.   On: 10/10/2017 20:46     Labs:   Basic Metabolic Panel: Recent Labs  Lab 10/10/17 1930  NA 134*  K 4.0  CL 96*  CO2 26  GLUCOSE 351*  BUN 36*  CREATININE 1.26*  CALCIUM 9.1   GFR Estimated Creatinine Clearance: 29.2 mL/min (A) (by C-G formula based on SCr of 1.26 mg/dL (H)). Liver Function Tests: Recent Labs  Lab 10/10/17 1930  AST 18  ALT 11*  ALKPHOS 122  BILITOT 0.3  PROT 6.5  ALBUMIN 3.1*   No results for input(s): LIPASE, AMYLASE in the last 168 hours. No results for input(s): AMMONIA in the last 168 hours. Coagulation profile No results for input(s): INR, PROTIME in the last 168 hours.  CBC: Recent Labs  Lab 10/10/17 1930  WBC 4.7  NEUTROABS 3.0  HGB 10.1*  HCT 32.5*  MCV 84.4  PLT 223   Cardiac Enzymes: Recent Labs  Lab 10/11/17 1212  TROPONINI <0.03   BNP: Invalid input(s): POCBNP CBG: Recent Labs  Lab 10/11/17 2103 10/12/17 0153 10/12/17 0237 10/12/17 0756 10/12/17 1153  GLUCAP 111* 62* 126* 116* 156*   D-Dimer Recent Labs    10/10/17 2107  DDIMER 0.49   Hgb A1c No results for input(s): HGBA1C in the last 72 hours. Lipid Profile No results for input(s): CHOL, HDL, LDLCALC, TRIG, CHOLHDL, LDLDIRECT in the last 72 hours. Thyroid function studies No results for input(s): TSH, T4TOTAL, T3FREE, THYROIDAB in the last 72  hours.  Invalid input(s): FREET3 Anemia work up No results for input(s): VITAMINB12, FOLATE, FERRITIN, TIBC, IRON, RETICCTPCT in the last 72 hours. Microbiology Recent Results (from the past 240 hour(s))  MRSA PCR Screening     Status: None   Collection Time: 10/11/17  1:15 AM  Result Value Ref Range Status   MRSA by PCR NEGATIVE NEGATIVE Final    Comment:        The GeneXpert MRSA Assay (FDA approved for NASAL specimens only), is one component of a comprehensive MRSA colonization surveillance program. It is not intended to diagnose MRSA infection nor to  guide or monitor treatment for MRSA infections. Performed at [facility], [place] [address], [place], [place] [address]      Discharge Instructions:   Discharge Instructions    Diet - low sodium heart healthy   Complete by:  As directed    Diet Carb Modified   Complete by:  As directed    Discharge instructions   Complete by:  As directed    Limit sedating meds Outpatient GI referral if medication adjustments do not help acid reflux (GERD diet) Consider pallaitive care referral  BMP/CBC 1 week   Increase activity slowly   Complete by:  As directed      Allergies as of 10/12/2017   No Known Allergies     Medication List    STOP taking these medications   gabapentin 300 MG capsule Commonly known as:  NEURONTIN   hydrALAZINE 25 MG tablet Commonly known as:  APRESOLINE   ULTRAM 50 MG tablet Generic drug:  traMADol     TAKE these medications   acetaminophen 325 MG tablet Commonly known as:  TYLENOL Take 650 mg by mouth every 6 (six) hours as needed for mild pain, moderate pain, fever or headache.   antiseptic oral rinse Liqd 5 mLs by Mouth Rinse route 2 (two) times daily.   BEPREVE 1.5 % Soln Generic drug:  Bepotastine Besilate Place 1 drop into both eyes daily.   CARDIZEM CD 240 MG 24 hr capsule Generic drug:  diltiazem Take 240 mg by mouth daily with breakfast.   cetirizine 10 MG tablet Commonly known as:  ZYRTEC Take 1 tablet (10 mg total) by mouth 2 (two) times daily. What changed:  when to take this   cyproheptadine 4 MG tablet Commonly known as:  PERIACTIN Take one tablet twice daily   diphenhydrAMINE 25 mg capsule Commonly known as:  BENADRYL Take 25 mg by mouth every 6 (six) hours as needed for itching.   divalproex 125 MG DR tablet Commonly known as:  DEPAKOTE Take 125 mg by mouth at bedtime.   docusate 50 MG/5ML liquid Commonly known as:  COLACE Take 100 mg by mouth 2 (two) times daily.   donepezil 10 MG tablet Commonly known as:   ARICEPT Take 10 mg by mouth at bedtime.   fluticasone 50 MCG/ACT nasal spray Commonly known as:  FLONASE Place 1 spray into both nostrils 2 (two) times daily.   furosemide 40 MG tablet Commonly known as:  LASIX Take 1 tablet (40 mg total) daily by mouth. What changed:  when to take this   guaiFENesin 600 MG 12 hr tablet Commonly known as:  MUCINEX Take 600 mg by mouth 2 (two) times daily.   hydroxypropyl methylcellulose / hypromellose 2.5 % ophthalmic solution Commonly known as:  ISOPTO TEARS / GONIOVISC Place 1 drop into both eyes 4 (four) times daily.   insulin aspart 100 UNIT/ML  injection Commonly known as:  novoLOG Inject 2-11 Units into the skin 4 (four) times daily. BL. MOD. ACCUCHECK WITH S/S USE NOVOLOG INSULIN  101-150 = 2U 151-200= 3U 201-250 = 5U 251-300 = 7U 301-350 = 9U >350 = 11U CALL MD FOR BS.400 OR < 60 Taken at 6am 11:30 am 14:30 and 21:00   ketotifen 0.025 % ophthalmic solution Commonly known as:  ZADITOR Place 1 drop into both eyes 2 (two) times daily.   levalbuterol 0.63 MG/3ML nebulizer solution Commonly known as:  XOPENEX Take 0.63 mg every 6 (six) hours as needed by nebulization for wheezing or shortness of breath.   LEVEMIR FLEXTOUCH 100 UNIT/ML Pen Generic drug:  Insulin Detemir Inject 24 Units into the skin daily at 10 pm.   levothyroxine 100 MCG tablet Commonly known as:  SYNTHROID, LEVOTHROID Take 100 mcg by mouth daily before breakfast.   LUVOX CR 150 MG Cp24 Generic drug:  Fluvoxamine Maleate Take 150 mg by mouth at bedtime.   magic mouthwash Soln Take 5 mLs by mouth 2 (two) times daily.   mirtazapine 15 MG tablet Commonly known as:  REMERON Take 15 mg by mouth at bedtime.   neomycin-polymyxin-dexameth 0.1 % Oint Commonly known as:  MAXITROL Place 1 application into both eyes at bedtime.   nitroGLYCERIN 0.4 MG SL tablet Commonly known as:  NITROSTAT Place 0.4 mg every 5 (five) minutes as needed under the tongue for  chest pain.   OCUSOFT LID SCRUB Pads Apply 1 each topically 2 (two) times daily.   pantoprazole 40 MG tablet Commonly known as:  PROTONIX Take 1 tablet (40 mg total) by mouth 2 (two) times daily before a meal. What changed:  when to take this   polyethylene glycol packet Commonly known as:  MIRALAX / GLYCOLAX Take 17 g by mouth daily as needed. What changed:  reasons to take this   potassium chloride SA 20 MEQ tablet Commonly known as:  K-DUR,KLOR-CON Take 20 mEq by mouth daily with breakfast.   ranitidine 150 MG tablet Commonly known as:  ZANTAC Take 150 mg by mouth 2 (two) times daily.   REFRESH CONTACTS DROPS Soln Place 1 drop into both eyes 4 (four) times daily.   Rivaroxaban 15 MG Tabs tablet Commonly known as:  XARELTO Take 15 mg by mouth daily with supper.   Saline 0.65 % (Soln) Soln Place 1 spray into both nostrils every 2 (two) hours as needed (for dry nares).   sucralfate 1 g tablet Commonly known as:  CARAFATE Take 1 g by mouth 4 (four) times daily.   SYSTANE 0.4-0.3 % Gel ophthalmic gel Generic drug:  Polyethyl Glycol-Propyl Glycol Place 1 application into both eyes at bedtime.   traZODone 50 MG tablet Commonly known as:  DESYREL Take 25 mg at bedtime by mouth.   triamcinolone cream 0.5 % Commonly known as:  KENALOG Apply 1 application topically 2 (two) times daily. Apply to legs for itching/rash   VASELINE Gel Generic drug:  white petrolatum Apply 1 application topically at bedtime.   VOLTAREN 1 % Gel Generic drug:  diclofenac sodium Apply 2 g topically as needed (for right hip and thigh pain). May use gel to right hip and thigh as needed for pain.       Contact information for follow-up providers    Seward Carol, MD Follow up in 1 week(s).   Specialty:  Internal Medicine Contact information: [address] [place] [address] [place] [address] [phone]  Contact information for after-discharge care    [facility] Follow up.   Service:  Skilled Nursing Contact information: [place] [phone]                   Time coordinating discharge: 35 min  Signed:  Geradine Girt   [facility] 10/12/2017, 4:40 PM

## 2017-10-13 DIAGNOSIS — E559 Vitamin D deficiency, unspecified: Secondary | ICD-10-CM | POA: Diagnosis not present

## 2017-10-13 DIAGNOSIS — R2689 Other abnormalities of gait and mobility: Secondary | ICD-10-CM | POA: Diagnosis not present

## 2017-10-13 DIAGNOSIS — Z79899 Other long term (current) drug therapy: Secondary | ICD-10-CM | POA: Diagnosis not present

## 2017-10-13 DIAGNOSIS — R1312 Dysphagia, oropharyngeal phase: Secondary | ICD-10-CM | POA: Diagnosis not present

## 2017-10-13 DIAGNOSIS — R293 Abnormal posture: Secondary | ICD-10-CM | POA: Diagnosis not present

## 2017-10-13 DIAGNOSIS — J45909 Unspecified asthma, uncomplicated: Secondary | ICD-10-CM | POA: Diagnosis not present

## 2017-10-13 DIAGNOSIS — R079 Chest pain, unspecified: Secondary | ICD-10-CM | POA: Diagnosis not present

## 2017-10-13 DIAGNOSIS — R278 Other lack of coordination: Secondary | ICD-10-CM | POA: Diagnosis not present

## 2017-10-13 DIAGNOSIS — I10 Essential (primary) hypertension: Secondary | ICD-10-CM | POA: Diagnosis not present

## 2017-10-13 DIAGNOSIS — R296 Repeated falls: Secondary | ICD-10-CM | POA: Diagnosis not present

## 2017-10-13 DIAGNOSIS — I4891 Unspecified atrial fibrillation: Secondary | ICD-10-CM | POA: Diagnosis not present

## 2017-10-13 DIAGNOSIS — K219 Gastro-esophageal reflux disease without esophagitis: Secondary | ICD-10-CM | POA: Diagnosis not present

## 2017-10-13 DIAGNOSIS — I509 Heart failure, unspecified: Secondary | ICD-10-CM | POA: Diagnosis not present

## 2017-10-13 DIAGNOSIS — I69898 Other sequelae of other cerebrovascular disease: Secondary | ICD-10-CM | POA: Diagnosis not present

## 2017-10-13 DIAGNOSIS — R1311 Dysphagia, oral phase: Secondary | ICD-10-CM | POA: Diagnosis not present

## 2017-10-13 DIAGNOSIS — D649 Anemia, unspecified: Secondary | ICD-10-CM | POA: Diagnosis not present

## 2017-10-13 DIAGNOSIS — E119 Type 2 diabetes mellitus without complications: Secondary | ICD-10-CM | POA: Diagnosis not present

## 2017-10-13 DIAGNOSIS — E039 Hypothyroidism, unspecified: Secondary | ICD-10-CM | POA: Diagnosis not present

## 2017-10-13 DIAGNOSIS — Z5181 Encounter for therapeutic drug level monitoring: Secondary | ICD-10-CM | POA: Diagnosis not present

## 2017-10-13 DIAGNOSIS — M6281 Muscle weakness (generalized): Secondary | ICD-10-CM | POA: Diagnosis not present

## 2017-10-13 DIAGNOSIS — E785 Hyperlipidemia, unspecified: Secondary | ICD-10-CM | POA: Diagnosis not present

## 2017-10-13 DIAGNOSIS — I482 Chronic atrial fibrillation: Secondary | ICD-10-CM | POA: Diagnosis not present

## 2017-10-13 DIAGNOSIS — J1089 Influenza due to other identified influenza virus with other manifestations: Secondary | ICD-10-CM | POA: Diagnosis not present

## 2017-10-16 DIAGNOSIS — R278 Other lack of coordination: Secondary | ICD-10-CM | POA: Diagnosis not present

## 2017-10-16 DIAGNOSIS — J1089 Influenza due to other identified influenza virus with other manifestations: Secondary | ICD-10-CM | POA: Diagnosis not present

## 2017-10-16 DIAGNOSIS — Z5181 Encounter for therapeutic drug level monitoring: Secondary | ICD-10-CM | POA: Diagnosis not present

## 2017-10-16 DIAGNOSIS — R296 Repeated falls: Secondary | ICD-10-CM | POA: Diagnosis not present

## 2017-10-16 DIAGNOSIS — R293 Abnormal posture: Secondary | ICD-10-CM | POA: Diagnosis not present

## 2017-10-16 DIAGNOSIS — R1311 Dysphagia, oral phase: Secondary | ICD-10-CM | POA: Diagnosis not present

## 2017-10-17 DIAGNOSIS — R278 Other lack of coordination: Secondary | ICD-10-CM | POA: Diagnosis not present

## 2017-10-17 DIAGNOSIS — Z5181 Encounter for therapeutic drug level monitoring: Secondary | ICD-10-CM | POA: Diagnosis not present

## 2017-10-17 DIAGNOSIS — J1089 Influenza due to other identified influenza virus with other manifestations: Secondary | ICD-10-CM | POA: Diagnosis not present

## 2017-10-17 DIAGNOSIS — R293 Abnormal posture: Secondary | ICD-10-CM | POA: Diagnosis not present

## 2017-10-17 DIAGNOSIS — R1311 Dysphagia, oral phase: Secondary | ICD-10-CM | POA: Diagnosis not present

## 2017-10-17 DIAGNOSIS — R296 Repeated falls: Secondary | ICD-10-CM | POA: Diagnosis not present

## 2017-10-18 DIAGNOSIS — R293 Abnormal posture: Secondary | ICD-10-CM | POA: Diagnosis not present

## 2017-10-18 DIAGNOSIS — I4891 Unspecified atrial fibrillation: Secondary | ICD-10-CM | POA: Diagnosis not present

## 2017-10-18 DIAGNOSIS — Z5181 Encounter for therapeutic drug level monitoring: Secondary | ICD-10-CM | POA: Diagnosis not present

## 2017-10-18 DIAGNOSIS — R079 Chest pain, unspecified: Secondary | ICD-10-CM | POA: Diagnosis not present

## 2017-10-18 DIAGNOSIS — I5030 Unspecified diastolic (congestive) heart failure: Secondary | ICD-10-CM | POA: Diagnosis not present

## 2017-10-18 DIAGNOSIS — R278 Other lack of coordination: Secondary | ICD-10-CM | POA: Diagnosis not present

## 2017-10-18 DIAGNOSIS — E119 Type 2 diabetes mellitus without complications: Secondary | ICD-10-CM | POA: Diagnosis not present

## 2017-10-18 DIAGNOSIS — R1311 Dysphagia, oral phase: Secondary | ICD-10-CM | POA: Diagnosis not present

## 2017-10-18 DIAGNOSIS — F0390 Unspecified dementia without behavioral disturbance: Secondary | ICD-10-CM | POA: Diagnosis not present

## 2017-10-18 DIAGNOSIS — R296 Repeated falls: Secondary | ICD-10-CM | POA: Diagnosis not present

## 2017-10-18 DIAGNOSIS — E039 Hypothyroidism, unspecified: Secondary | ICD-10-CM | POA: Diagnosis not present

## 2017-10-18 DIAGNOSIS — F419 Anxiety disorder, unspecified: Secondary | ICD-10-CM | POA: Diagnosis not present

## 2017-10-18 DIAGNOSIS — J1089 Influenza due to other identified influenza virus with other manifestations: Secondary | ICD-10-CM | POA: Diagnosis not present

## 2017-10-19 DIAGNOSIS — R1311 Dysphagia, oral phase: Secondary | ICD-10-CM | POA: Diagnosis not present

## 2017-10-19 DIAGNOSIS — J1089 Influenza due to other identified influenza virus with other manifestations: Secondary | ICD-10-CM | POA: Diagnosis not present

## 2017-10-19 DIAGNOSIS — R296 Repeated falls: Secondary | ICD-10-CM | POA: Diagnosis not present

## 2017-10-19 DIAGNOSIS — R278 Other lack of coordination: Secondary | ICD-10-CM | POA: Diagnosis not present

## 2017-10-19 DIAGNOSIS — Z5181 Encounter for therapeutic drug level monitoring: Secondary | ICD-10-CM | POA: Diagnosis not present

## 2017-10-19 DIAGNOSIS — R293 Abnormal posture: Secondary | ICD-10-CM | POA: Diagnosis not present

## 2017-10-20 DIAGNOSIS — Z5181 Encounter for therapeutic drug level monitoring: Secondary | ICD-10-CM | POA: Diagnosis not present

## 2017-10-20 DIAGNOSIS — R278 Other lack of coordination: Secondary | ICD-10-CM | POA: Diagnosis not present

## 2017-10-20 DIAGNOSIS — J1089 Influenza due to other identified influenza virus with other manifestations: Secondary | ICD-10-CM | POA: Diagnosis not present

## 2017-10-20 DIAGNOSIS — R296 Repeated falls: Secondary | ICD-10-CM | POA: Diagnosis not present

## 2017-10-20 DIAGNOSIS — R293 Abnormal posture: Secondary | ICD-10-CM | POA: Diagnosis not present

## 2017-10-20 DIAGNOSIS — R1311 Dysphagia, oral phase: Secondary | ICD-10-CM | POA: Diagnosis not present

## 2017-10-23 DIAGNOSIS — J1089 Influenza due to other identified influenza virus with other manifestations: Secondary | ICD-10-CM | POA: Diagnosis not present

## 2017-10-23 DIAGNOSIS — R278 Other lack of coordination: Secondary | ICD-10-CM | POA: Diagnosis not present

## 2017-10-23 DIAGNOSIS — R293 Abnormal posture: Secondary | ICD-10-CM | POA: Diagnosis not present

## 2017-10-23 DIAGNOSIS — R1311 Dysphagia, oral phase: Secondary | ICD-10-CM | POA: Diagnosis not present

## 2017-10-23 DIAGNOSIS — R296 Repeated falls: Secondary | ICD-10-CM | POA: Diagnosis not present

## 2017-10-23 DIAGNOSIS — Z5181 Encounter for therapeutic drug level monitoring: Secondary | ICD-10-CM | POA: Diagnosis not present

## 2017-10-24 DIAGNOSIS — R1311 Dysphagia, oral phase: Secondary | ICD-10-CM | POA: Diagnosis not present

## 2017-10-24 DIAGNOSIS — R278 Other lack of coordination: Secondary | ICD-10-CM | POA: Diagnosis not present

## 2017-10-24 DIAGNOSIS — R296 Repeated falls: Secondary | ICD-10-CM | POA: Diagnosis not present

## 2017-10-24 DIAGNOSIS — R293 Abnormal posture: Secondary | ICD-10-CM | POA: Diagnosis not present

## 2017-10-24 DIAGNOSIS — Z5181 Encounter for therapeutic drug level monitoring: Secondary | ICD-10-CM | POA: Diagnosis not present

## 2017-10-24 DIAGNOSIS — J1089 Influenza due to other identified influenza virus with other manifestations: Secondary | ICD-10-CM | POA: Diagnosis not present

## 2017-10-25 DIAGNOSIS — Z5181 Encounter for therapeutic drug level monitoring: Secondary | ICD-10-CM | POA: Diagnosis not present

## 2017-10-25 DIAGNOSIS — R1311 Dysphagia, oral phase: Secondary | ICD-10-CM | POA: Diagnosis not present

## 2017-10-25 DIAGNOSIS — J1089 Influenza due to other identified influenza virus with other manifestations: Secondary | ICD-10-CM | POA: Diagnosis not present

## 2017-10-25 DIAGNOSIS — F429 Obsessive-compulsive disorder, unspecified: Secondary | ICD-10-CM | POA: Diagnosis not present

## 2017-10-25 DIAGNOSIS — G4700 Insomnia, unspecified: Secondary | ICD-10-CM | POA: Diagnosis not present

## 2017-10-25 DIAGNOSIS — G301 Alzheimer's disease with late onset: Secondary | ICD-10-CM | POA: Diagnosis not present

## 2017-10-25 DIAGNOSIS — R296 Repeated falls: Secondary | ICD-10-CM | POA: Diagnosis not present

## 2017-10-25 DIAGNOSIS — R278 Other lack of coordination: Secondary | ICD-10-CM | POA: Diagnosis not present

## 2017-10-25 DIAGNOSIS — R293 Abnormal posture: Secondary | ICD-10-CM | POA: Diagnosis not present

## 2017-10-25 DIAGNOSIS — F0280 Dementia in other diseases classified elsewhere without behavioral disturbance: Secondary | ICD-10-CM | POA: Diagnosis not present

## 2017-10-25 DIAGNOSIS — F330 Major depressive disorder, recurrent, mild: Secondary | ICD-10-CM | POA: Diagnosis not present

## 2017-10-26 DIAGNOSIS — Z5181 Encounter for therapeutic drug level monitoring: Secondary | ICD-10-CM | POA: Diagnosis not present

## 2017-10-26 DIAGNOSIS — I4891 Unspecified atrial fibrillation: Secondary | ICD-10-CM | POA: Diagnosis not present

## 2017-10-26 DIAGNOSIS — K219 Gastro-esophageal reflux disease without esophagitis: Secondary | ICD-10-CM | POA: Diagnosis not present

## 2017-10-26 DIAGNOSIS — I509 Heart failure, unspecified: Secondary | ICD-10-CM | POA: Diagnosis not present

## 2017-10-26 DIAGNOSIS — J1089 Influenza due to other identified influenza virus with other manifestations: Secondary | ICD-10-CM | POA: Diagnosis not present

## 2017-10-26 DIAGNOSIS — E119 Type 2 diabetes mellitus without complications: Secondary | ICD-10-CM | POA: Diagnosis not present

## 2017-10-26 DIAGNOSIS — R1311 Dysphagia, oral phase: Secondary | ICD-10-CM | POA: Diagnosis not present

## 2017-10-26 DIAGNOSIS — R293 Abnormal posture: Secondary | ICD-10-CM | POA: Diagnosis not present

## 2017-10-26 DIAGNOSIS — R278 Other lack of coordination: Secondary | ICD-10-CM | POA: Diagnosis not present

## 2017-10-26 DIAGNOSIS — R296 Repeated falls: Secondary | ICD-10-CM | POA: Diagnosis not present

## 2017-10-27 DIAGNOSIS — R278 Other lack of coordination: Secondary | ICD-10-CM | POA: Diagnosis not present

## 2017-10-27 DIAGNOSIS — R296 Repeated falls: Secondary | ICD-10-CM | POA: Diagnosis not present

## 2017-10-27 DIAGNOSIS — J1089 Influenza due to other identified influenza virus with other manifestations: Secondary | ICD-10-CM | POA: Diagnosis not present

## 2017-10-27 DIAGNOSIS — R1311 Dysphagia, oral phase: Secondary | ICD-10-CM | POA: Diagnosis not present

## 2017-10-27 DIAGNOSIS — R293 Abnormal posture: Secondary | ICD-10-CM | POA: Diagnosis not present

## 2017-10-27 DIAGNOSIS — Z5181 Encounter for therapeutic drug level monitoring: Secondary | ICD-10-CM | POA: Diagnosis not present

## 2017-10-30 DIAGNOSIS — R296 Repeated falls: Secondary | ICD-10-CM | POA: Diagnosis not present

## 2017-10-30 DIAGNOSIS — R293 Abnormal posture: Secondary | ICD-10-CM | POA: Diagnosis not present

## 2017-10-30 DIAGNOSIS — R1311 Dysphagia, oral phase: Secondary | ICD-10-CM | POA: Diagnosis not present

## 2017-10-30 DIAGNOSIS — J1089 Influenza due to other identified influenza virus with other manifestations: Secondary | ICD-10-CM | POA: Diagnosis not present

## 2017-10-30 DIAGNOSIS — Z5181 Encounter for therapeutic drug level monitoring: Secondary | ICD-10-CM | POA: Diagnosis not present

## 2017-10-30 DIAGNOSIS — R278 Other lack of coordination: Secondary | ICD-10-CM | POA: Diagnosis not present

## 2017-10-31 DIAGNOSIS — R278 Other lack of coordination: Secondary | ICD-10-CM | POA: Diagnosis not present

## 2017-10-31 DIAGNOSIS — J1089 Influenza due to other identified influenza virus with other manifestations: Secondary | ICD-10-CM | POA: Diagnosis not present

## 2017-10-31 DIAGNOSIS — Z5181 Encounter for therapeutic drug level monitoring: Secondary | ICD-10-CM | POA: Diagnosis not present

## 2017-10-31 DIAGNOSIS — R296 Repeated falls: Secondary | ICD-10-CM | POA: Diagnosis not present

## 2017-10-31 DIAGNOSIS — R1311 Dysphagia, oral phase: Secondary | ICD-10-CM | POA: Diagnosis not present

## 2017-10-31 DIAGNOSIS — R293 Abnormal posture: Secondary | ICD-10-CM | POA: Diagnosis not present

## 2017-11-01 DIAGNOSIS — R296 Repeated falls: Secondary | ICD-10-CM | POA: Diagnosis not present

## 2017-11-01 DIAGNOSIS — F419 Anxiety disorder, unspecified: Secondary | ICD-10-CM | POA: Diagnosis not present

## 2017-11-01 DIAGNOSIS — Z5181 Encounter for therapeutic drug level monitoring: Secondary | ICD-10-CM | POA: Diagnosis not present

## 2017-11-01 DIAGNOSIS — J1089 Influenza due to other identified influenza virus with other manifestations: Secondary | ICD-10-CM | POA: Diagnosis not present

## 2017-11-01 DIAGNOSIS — R293 Abnormal posture: Secondary | ICD-10-CM | POA: Diagnosis not present

## 2017-11-01 DIAGNOSIS — R278 Other lack of coordination: Secondary | ICD-10-CM | POA: Diagnosis not present

## 2017-11-01 DIAGNOSIS — G301 Alzheimer's disease with late onset: Secondary | ICD-10-CM | POA: Diagnosis not present

## 2017-11-01 DIAGNOSIS — R1311 Dysphagia, oral phase: Secondary | ICD-10-CM | POA: Diagnosis not present

## 2017-11-02 DIAGNOSIS — J1089 Influenza due to other identified influenza virus with other manifestations: Secondary | ICD-10-CM | POA: Diagnosis not present

## 2017-11-02 DIAGNOSIS — R278 Other lack of coordination: Secondary | ICD-10-CM | POA: Diagnosis not present

## 2017-11-02 DIAGNOSIS — R1311 Dysphagia, oral phase: Secondary | ICD-10-CM | POA: Diagnosis not present

## 2017-11-02 DIAGNOSIS — R296 Repeated falls: Secondary | ICD-10-CM | POA: Diagnosis not present

## 2017-11-02 DIAGNOSIS — R293 Abnormal posture: Secondary | ICD-10-CM | POA: Diagnosis not present

## 2017-11-02 DIAGNOSIS — Z5181 Encounter for therapeutic drug level monitoring: Secondary | ICD-10-CM | POA: Diagnosis not present

## 2017-11-03 DIAGNOSIS — Z5181 Encounter for therapeutic drug level monitoring: Secondary | ICD-10-CM | POA: Diagnosis not present

## 2017-11-03 DIAGNOSIS — R296 Repeated falls: Secondary | ICD-10-CM | POA: Diagnosis not present

## 2017-11-03 DIAGNOSIS — R1311 Dysphagia, oral phase: Secondary | ICD-10-CM | POA: Diagnosis not present

## 2017-11-03 DIAGNOSIS — R293 Abnormal posture: Secondary | ICD-10-CM | POA: Diagnosis not present

## 2017-11-03 DIAGNOSIS — J1089 Influenza due to other identified influenza virus with other manifestations: Secondary | ICD-10-CM | POA: Diagnosis not present

## 2017-11-03 DIAGNOSIS — R278 Other lack of coordination: Secondary | ICD-10-CM | POA: Diagnosis not present

## 2017-11-07 DIAGNOSIS — F330 Major depressive disorder, recurrent, mild: Secondary | ICD-10-CM | POA: Diagnosis not present

## 2017-11-07 DIAGNOSIS — F419 Anxiety disorder, unspecified: Secondary | ICD-10-CM | POA: Diagnosis not present

## 2017-11-07 DIAGNOSIS — F0280 Dementia in other diseases classified elsewhere without behavioral disturbance: Secondary | ICD-10-CM | POA: Diagnosis not present

## 2017-11-07 DIAGNOSIS — G4700 Insomnia, unspecified: Secondary | ICD-10-CM | POA: Diagnosis not present

## 2017-11-07 DIAGNOSIS — G301 Alzheimer's disease with late onset: Secondary | ICD-10-CM | POA: Diagnosis not present

## 2017-11-08 DIAGNOSIS — F419 Anxiety disorder, unspecified: Secondary | ICD-10-CM | POA: Diagnosis not present

## 2017-11-08 DIAGNOSIS — G301 Alzheimer's disease with late onset: Secondary | ICD-10-CM | POA: Diagnosis not present

## 2017-11-14 DIAGNOSIS — F419 Anxiety disorder, unspecified: Secondary | ICD-10-CM | POA: Diagnosis not present

## 2017-11-14 DIAGNOSIS — G301 Alzheimer's disease with late onset: Secondary | ICD-10-CM | POA: Diagnosis not present

## 2017-11-21 DIAGNOSIS — F419 Anxiety disorder, unspecified: Secondary | ICD-10-CM | POA: Diagnosis not present

## 2017-11-21 DIAGNOSIS — G301 Alzheimer's disease with late onset: Secondary | ICD-10-CM | POA: Diagnosis not present

## 2017-11-22 DIAGNOSIS — F0280 Dementia in other diseases classified elsewhere without behavioral disturbance: Secondary | ICD-10-CM | POA: Diagnosis not present

## 2017-11-22 DIAGNOSIS — G4700 Insomnia, unspecified: Secondary | ICD-10-CM | POA: Diagnosis not present

## 2017-11-22 DIAGNOSIS — F429 Obsessive-compulsive disorder, unspecified: Secondary | ICD-10-CM | POA: Diagnosis not present

## 2017-11-22 DIAGNOSIS — F330 Major depressive disorder, recurrent, mild: Secondary | ICD-10-CM | POA: Diagnosis not present

## 2017-11-22 DIAGNOSIS — G301 Alzheimer's disease with late onset: Secondary | ICD-10-CM | POA: Diagnosis not present

## 2017-11-29 DIAGNOSIS — F419 Anxiety disorder, unspecified: Secondary | ICD-10-CM | POA: Diagnosis not present

## 2017-11-29 DIAGNOSIS — G301 Alzheimer's disease with late onset: Secondary | ICD-10-CM | POA: Diagnosis not present

## 2017-12-05 DIAGNOSIS — F419 Anxiety disorder, unspecified: Secondary | ICD-10-CM | POA: Diagnosis not present

## 2017-12-05 DIAGNOSIS — G301 Alzheimer's disease with late onset: Secondary | ICD-10-CM | POA: Diagnosis not present

## 2017-12-08 DIAGNOSIS — I739 Peripheral vascular disease, unspecified: Secondary | ICD-10-CM | POA: Diagnosis not present

## 2017-12-11 DIAGNOSIS — F429 Obsessive-compulsive disorder, unspecified: Secondary | ICD-10-CM | POA: Diagnosis not present

## 2017-12-11 DIAGNOSIS — G301 Alzheimer's disease with late onset: Secondary | ICD-10-CM | POA: Diagnosis not present

## 2017-12-11 DIAGNOSIS — F330 Major depressive disorder, recurrent, mild: Secondary | ICD-10-CM | POA: Diagnosis not present

## 2017-12-11 DIAGNOSIS — F419 Anxiety disorder, unspecified: Secondary | ICD-10-CM | POA: Diagnosis not present

## 2017-12-11 DIAGNOSIS — F0280 Dementia in other diseases classified elsewhere without behavioral disturbance: Secondary | ICD-10-CM | POA: Diagnosis not present

## 2017-12-11 DIAGNOSIS — G4700 Insomnia, unspecified: Secondary | ICD-10-CM | POA: Diagnosis not present

## 2017-12-13 DIAGNOSIS — G4700 Insomnia, unspecified: Secondary | ICD-10-CM | POA: Diagnosis not present

## 2017-12-13 DIAGNOSIS — G301 Alzheimer's disease with late onset: Secondary | ICD-10-CM | POA: Diagnosis not present

## 2017-12-13 DIAGNOSIS — F0390 Unspecified dementia without behavioral disturbance: Secondary | ICD-10-CM | POA: Diagnosis not present

## 2017-12-13 DIAGNOSIS — I4891 Unspecified atrial fibrillation: Secondary | ICD-10-CM | POA: Diagnosis not present

## 2017-12-13 DIAGNOSIS — F429 Obsessive-compulsive disorder, unspecified: Secondary | ICD-10-CM | POA: Diagnosis not present

## 2017-12-13 DIAGNOSIS — E119 Type 2 diabetes mellitus without complications: Secondary | ICD-10-CM | POA: Diagnosis not present

## 2017-12-13 DIAGNOSIS — F0280 Dementia in other diseases classified elsewhere without behavioral disturbance: Secondary | ICD-10-CM | POA: Diagnosis not present

## 2017-12-13 DIAGNOSIS — E039 Hypothyroidism, unspecified: Secondary | ICD-10-CM | POA: Diagnosis not present

## 2017-12-13 DIAGNOSIS — I10 Essential (primary) hypertension: Secondary | ICD-10-CM | POA: Diagnosis not present

## 2017-12-13 DIAGNOSIS — F419 Anxiety disorder, unspecified: Secondary | ICD-10-CM | POA: Diagnosis not present

## 2017-12-13 DIAGNOSIS — F330 Major depressive disorder, recurrent, mild: Secondary | ICD-10-CM | POA: Diagnosis not present

## 2017-12-19 DIAGNOSIS — J45909 Unspecified asthma, uncomplicated: Secondary | ICD-10-CM | POA: Diagnosis not present

## 2017-12-19 DIAGNOSIS — R278 Other lack of coordination: Secondary | ICD-10-CM | POA: Diagnosis not present

## 2017-12-19 DIAGNOSIS — R296 Repeated falls: Secondary | ICD-10-CM | POA: Diagnosis not present

## 2017-12-19 DIAGNOSIS — Z5181 Encounter for therapeutic drug level monitoring: Secondary | ICD-10-CM | POA: Diagnosis not present

## 2017-12-19 DIAGNOSIS — J1089 Influenza due to other identified influenza virus with other manifestations: Secondary | ICD-10-CM | POA: Diagnosis not present

## 2017-12-19 DIAGNOSIS — R1312 Dysphagia, oropharyngeal phase: Secondary | ICD-10-CM | POA: Diagnosis not present

## 2017-12-19 DIAGNOSIS — R293 Abnormal posture: Secondary | ICD-10-CM | POA: Diagnosis not present

## 2017-12-19 DIAGNOSIS — I10 Essential (primary) hypertension: Secondary | ICD-10-CM | POA: Diagnosis not present

## 2017-12-19 DIAGNOSIS — R1311 Dysphagia, oral phase: Secondary | ICD-10-CM | POA: Diagnosis not present

## 2017-12-19 DIAGNOSIS — F419 Anxiety disorder, unspecified: Secondary | ICD-10-CM | POA: Diagnosis not present

## 2017-12-19 DIAGNOSIS — G301 Alzheimer's disease with late onset: Secondary | ICD-10-CM | POA: Diagnosis not present

## 2017-12-19 DIAGNOSIS — M6281 Muscle weakness (generalized): Secondary | ICD-10-CM | POA: Diagnosis not present

## 2017-12-20 DIAGNOSIS — R293 Abnormal posture: Secondary | ICD-10-CM | POA: Diagnosis not present

## 2017-12-20 DIAGNOSIS — Z5181 Encounter for therapeutic drug level monitoring: Secondary | ICD-10-CM | POA: Diagnosis not present

## 2017-12-20 DIAGNOSIS — R278 Other lack of coordination: Secondary | ICD-10-CM | POA: Diagnosis not present

## 2017-12-20 DIAGNOSIS — R296 Repeated falls: Secondary | ICD-10-CM | POA: Diagnosis not present

## 2017-12-20 DIAGNOSIS — R1311 Dysphagia, oral phase: Secondary | ICD-10-CM | POA: Diagnosis not present

## 2017-12-20 DIAGNOSIS — J1089 Influenza due to other identified influenza virus with other manifestations: Secondary | ICD-10-CM | POA: Diagnosis not present

## 2017-12-21 DIAGNOSIS — J1089 Influenza due to other identified influenza virus with other manifestations: Secondary | ICD-10-CM | POA: Diagnosis not present

## 2017-12-21 DIAGNOSIS — R1311 Dysphagia, oral phase: Secondary | ICD-10-CM | POA: Diagnosis not present

## 2017-12-21 DIAGNOSIS — Z5181 Encounter for therapeutic drug level monitoring: Secondary | ICD-10-CM | POA: Diagnosis not present

## 2017-12-21 DIAGNOSIS — R278 Other lack of coordination: Secondary | ICD-10-CM | POA: Diagnosis not present

## 2017-12-21 DIAGNOSIS — R296 Repeated falls: Secondary | ICD-10-CM | POA: Diagnosis not present

## 2017-12-21 DIAGNOSIS — R293 Abnormal posture: Secondary | ICD-10-CM | POA: Diagnosis not present

## 2017-12-22 DIAGNOSIS — R1311 Dysphagia, oral phase: Secondary | ICD-10-CM | POA: Diagnosis not present

## 2017-12-22 DIAGNOSIS — J1089 Influenza due to other identified influenza virus with other manifestations: Secondary | ICD-10-CM | POA: Diagnosis not present

## 2017-12-22 DIAGNOSIS — R296 Repeated falls: Secondary | ICD-10-CM | POA: Diagnosis not present

## 2017-12-22 DIAGNOSIS — R293 Abnormal posture: Secondary | ICD-10-CM | POA: Diagnosis not present

## 2017-12-22 DIAGNOSIS — R278 Other lack of coordination: Secondary | ICD-10-CM | POA: Diagnosis not present

## 2017-12-22 DIAGNOSIS — Z5181 Encounter for therapeutic drug level monitoring: Secondary | ICD-10-CM | POA: Diagnosis not present

## 2017-12-25 DIAGNOSIS — R293 Abnormal posture: Secondary | ICD-10-CM | POA: Diagnosis not present

## 2017-12-25 DIAGNOSIS — Z5181 Encounter for therapeutic drug level monitoring: Secondary | ICD-10-CM | POA: Diagnosis not present

## 2017-12-25 DIAGNOSIS — J1089 Influenza due to other identified influenza virus with other manifestations: Secondary | ICD-10-CM | POA: Diagnosis not present

## 2017-12-25 DIAGNOSIS — R278 Other lack of coordination: Secondary | ICD-10-CM | POA: Diagnosis not present

## 2017-12-25 DIAGNOSIS — R1311 Dysphagia, oral phase: Secondary | ICD-10-CM | POA: Diagnosis not present

## 2017-12-25 DIAGNOSIS — R296 Repeated falls: Secondary | ICD-10-CM | POA: Diagnosis not present

## 2017-12-26 DIAGNOSIS — Z5181 Encounter for therapeutic drug level monitoring: Secondary | ICD-10-CM | POA: Diagnosis not present

## 2017-12-26 DIAGNOSIS — R1311 Dysphagia, oral phase: Secondary | ICD-10-CM | POA: Diagnosis not present

## 2017-12-26 DIAGNOSIS — R278 Other lack of coordination: Secondary | ICD-10-CM | POA: Diagnosis not present

## 2017-12-26 DIAGNOSIS — J1089 Influenza due to other identified influenza virus with other manifestations: Secondary | ICD-10-CM | POA: Diagnosis not present

## 2017-12-26 DIAGNOSIS — R293 Abnormal posture: Secondary | ICD-10-CM | POA: Diagnosis not present

## 2017-12-26 DIAGNOSIS — R296 Repeated falls: Secondary | ICD-10-CM | POA: Diagnosis not present

## 2017-12-27 DIAGNOSIS — F419 Anxiety disorder, unspecified: Secondary | ICD-10-CM | POA: Diagnosis not present

## 2017-12-27 DIAGNOSIS — R296 Repeated falls: Secondary | ICD-10-CM | POA: Diagnosis not present

## 2017-12-27 DIAGNOSIS — G301 Alzheimer's disease with late onset: Secondary | ICD-10-CM | POA: Diagnosis not present

## 2017-12-27 DIAGNOSIS — Z5181 Encounter for therapeutic drug level monitoring: Secondary | ICD-10-CM | POA: Diagnosis not present

## 2017-12-27 DIAGNOSIS — R278 Other lack of coordination: Secondary | ICD-10-CM | POA: Diagnosis not present

## 2017-12-27 DIAGNOSIS — R1311 Dysphagia, oral phase: Secondary | ICD-10-CM | POA: Diagnosis not present

## 2017-12-27 DIAGNOSIS — J1089 Influenza due to other identified influenza virus with other manifestations: Secondary | ICD-10-CM | POA: Diagnosis not present

## 2017-12-27 DIAGNOSIS — G4700 Insomnia, unspecified: Secondary | ICD-10-CM | POA: Diagnosis not present

## 2017-12-27 DIAGNOSIS — F429 Obsessive-compulsive disorder, unspecified: Secondary | ICD-10-CM | POA: Diagnosis not present

## 2017-12-27 DIAGNOSIS — F0280 Dementia in other diseases classified elsewhere without behavioral disturbance: Secondary | ICD-10-CM | POA: Diagnosis not present

## 2017-12-27 DIAGNOSIS — R293 Abnormal posture: Secondary | ICD-10-CM | POA: Diagnosis not present

## 2017-12-27 DIAGNOSIS — F330 Major depressive disorder, recurrent, mild: Secondary | ICD-10-CM | POA: Diagnosis not present

## 2017-12-28 DIAGNOSIS — R296 Repeated falls: Secondary | ICD-10-CM | POA: Diagnosis not present

## 2017-12-28 DIAGNOSIS — Z5181 Encounter for therapeutic drug level monitoring: Secondary | ICD-10-CM | POA: Diagnosis not present

## 2017-12-28 DIAGNOSIS — J1089 Influenza due to other identified influenza virus with other manifestations: Secondary | ICD-10-CM | POA: Diagnosis not present

## 2017-12-28 DIAGNOSIS — R278 Other lack of coordination: Secondary | ICD-10-CM | POA: Diagnosis not present

## 2017-12-28 DIAGNOSIS — R1311 Dysphagia, oral phase: Secondary | ICD-10-CM | POA: Diagnosis not present

## 2017-12-28 DIAGNOSIS — R293 Abnormal posture: Secondary | ICD-10-CM | POA: Diagnosis not present

## 2017-12-29 DIAGNOSIS — Z5181 Encounter for therapeutic drug level monitoring: Secondary | ICD-10-CM | POA: Diagnosis not present

## 2017-12-29 DIAGNOSIS — R296 Repeated falls: Secondary | ICD-10-CM | POA: Diagnosis not present

## 2017-12-29 DIAGNOSIS — R278 Other lack of coordination: Secondary | ICD-10-CM | POA: Diagnosis not present

## 2017-12-29 DIAGNOSIS — J1089 Influenza due to other identified influenza virus with other manifestations: Secondary | ICD-10-CM | POA: Diagnosis not present

## 2017-12-29 DIAGNOSIS — R293 Abnormal posture: Secondary | ICD-10-CM | POA: Diagnosis not present

## 2017-12-29 DIAGNOSIS — R1311 Dysphagia, oral phase: Secondary | ICD-10-CM | POA: Diagnosis not present

## 2018-01-01 DIAGNOSIS — R1311 Dysphagia, oral phase: Secondary | ICD-10-CM | POA: Diagnosis not present

## 2018-01-01 DIAGNOSIS — J1089 Influenza due to other identified influenza virus with other manifestations: Secondary | ICD-10-CM | POA: Diagnosis not present

## 2018-01-01 DIAGNOSIS — R293 Abnormal posture: Secondary | ICD-10-CM | POA: Diagnosis not present

## 2018-01-01 DIAGNOSIS — R296 Repeated falls: Secondary | ICD-10-CM | POA: Diagnosis not present

## 2018-01-01 DIAGNOSIS — Z5181 Encounter for therapeutic drug level monitoring: Secondary | ICD-10-CM | POA: Diagnosis not present

## 2018-01-01 DIAGNOSIS — R278 Other lack of coordination: Secondary | ICD-10-CM | POA: Diagnosis not present

## 2018-01-02 ENCOUNTER — Encounter: Payer: Self-pay | Admitting: Allergy and Immunology

## 2018-01-02 ENCOUNTER — Telehealth: Payer: Self-pay | Admitting: *Deleted

## 2018-01-02 ENCOUNTER — Ambulatory Visit (INDEPENDENT_AMBULATORY_CARE_PROVIDER_SITE_OTHER): Payer: Medicare Other | Admitting: Allergy and Immunology

## 2018-01-02 VITALS — BP 112/78 | HR 92 | Resp 20

## 2018-01-02 DIAGNOSIS — J3089 Other allergic rhinitis: Secondary | ICD-10-CM

## 2018-01-02 DIAGNOSIS — L299 Pruritus, unspecified: Secondary | ICD-10-CM

## 2018-01-02 NOTE — Patient Instructions (Addendum)
  1. Use a combination of the following medications every day:   A. Cetirizine 10mg  one tablet two times per day  B. Ranitidine 150 one tablet two times per day   C. loratadine 10 mg one tablet 2 times per day  D. Flonase - one spray each nostril two times per day  2. Continue moisturization of skin after bath / shower  3. Refer to [facility] Dermatology department for pruritic disorder.  4. Return to clinic in 6 months

## 2018-01-02 NOTE — Progress Notes (Signed)
Follow-up Note  Referring Provider: Seward Carol, MD Primary Provider: Seward Carol, MD Date of Office Visit: 01/02/2018  Subjective:   Joanna Hill (DOB: Jul 13, 1932) is a 84 y.o. female who returns to the [facility] on 01/02/2018 in re-evaluation of the following:  HPI: Joanna Hill returns to this clinic in evaluation of her pruritic disorder.  I last saw her in this clinic 13 September 2017 at which point in time we referred her to dermatology concerning this issue.  Apparently the dermatologist did not offer any type of therapy to help her with this condition.  She still continues to have burning and she still continues to have itchiness across her body.  She does not really notice any obvious dermatitis unless of course there is an area that she scratches extensively.  She has been using Vaseline for the most part on her skin after a bath or shower.  She does continue to use medical therapy as prescribed which has not really help this issue go away.  It may have helped this issue in regard to intensity.  Her nose is doing very well while she continues to use Flonase.  Allergies as of 01/02/2018   No Known Allergies     Medication List      acetaminophen 325 MG tablet Commonly known as:  TYLENOL Take 650 mg by mouth every 6 (six) hours as needed for mild pain, moderate pain, fever or headache.   antiseptic oral rinse Liqd 5 mLs by Mouth Rinse route 2 (two) times daily.   BEPREVE 1.5 % Soln Generic drug:  Bepotastine Besilate Place 1 drop into both eyes daily.   CARDIZEM CD 240 MG 24 hr capsule Generic drug:  diltiazem Take 240 mg by mouth daily with breakfast.   cetirizine 10 MG tablet Commonly known as:  ZYRTEC Take 1 tablet (10 mg total) by mouth 2 (two) times daily.   cyproheptadine 4 MG tablet Commonly known as:  PERIACTIN Take one tablet twice daily   diphenhydrAMINE 25 mg capsule Commonly known as:  BENADRYL Take 25 mg by mouth every 6 (six)  hours as needed for itching.   divalproex 125 MG DR tablet Commonly known as:  DEPAKOTE Take 125 mg by mouth at bedtime.   docusate 50 MG/5ML liquid Commonly known as:  COLACE Take 100 mg by mouth 2 (two) times daily.   donepezil 10 MG tablet Commonly known as:  ARICEPT Take 10 mg by mouth at bedtime.   fluticasone 50 MCG/ACT nasal spray Commonly known as:  FLONASE Place 1 spray into both nostrils 2 (two) times daily.   furosemide 40 MG tablet Commonly known as:  LASIX Take 1 tablet (40 mg total) daily by mouth.   guaiFENesin 600 MG 12 hr tablet Commonly known as:  MUCINEX Take 600 mg by mouth 2 (two) times daily.   HUMALOG KWIKPEN 100 UNIT/ML KiwkPen Generic drug:  insulin lispro   hydrocortisone 20 MG tablet Commonly known as:  CORTEF   hydroxypropyl methylcellulose / hypromellose 2.5 % ophthalmic solution Commonly known as:  ISOPTO TEARS / GONIOVISC Place 1 drop into both eyes 4 (four) times daily.   insulin aspart 100 UNIT/ML injection Commonly known as:  novoLOG Inject 2-11 Units into the skin 4 (four) times daily. BL. MOD. ACCUCHECK WITH S/S USE NOVOLOG INSULIN  101-150 = 2U 151-200= 3U 201-250 = 5U 251-300 = 7U 301-350 = 9U >350 = 11U CALL MD FOR BS.400 OR < 60 Taken at [employer]  11:30 am 14:30 and 21:00   ketotifen 0.025 % ophthalmic solution Commonly known as:  ZADITOR Place 1 drop into both eyes 2 (two) times daily.   levalbuterol 0.63 MG/3ML nebulizer solution Commonly known as:  XOPENEX   LEVEMIR FLEXTOUCH 100 UNIT/ML Pen Generic drug:  Insulin Detemir Inject 24 Units into the skin daily at 10 pm.   levothyroxine 100 MCG tablet Commonly known as:  SYNTHROID, LEVOTHROID Take 100 mcg by mouth daily before breakfast.   LUVOX CR 150 MG Cp24 Generic drug:  Fluvoxamine Maleate Take 150 mg by mouth at bedtime.   magic mouthwash Soln Take 5 mLs by mouth 2 (two) times daily.   mirtazapine 15 MG tablet Commonly known as:  REMERON Take 15 mg by  mouth at bedtime.   neomycin-polymyxin-dexameth 0.1 % Oint Commonly known as:  MAXITROL Place 1 application into both eyes at bedtime.   nitroGLYCERIN 0.4 MG SL tablet Commonly known as:  NITROSTAT Place 0.4 mg every 5 (five) minutes as needed under the tongue for chest pain.   OCUSOFT LID SCRUB Pads Apply 1 each topically 2 (two) times daily.   pantoprazole 40 MG tablet Commonly known as:  PROTONIX Take 1 tablet (40 mg total) by mouth 2 (two) times daily before a meal.   polyethylene glycol packet Commonly known as:  MIRALAX / GLYCOLAX Take 17 g by mouth daily as needed.   potassium chloride SA 20 MEQ tablet Commonly known as:  K-DUR,KLOR-CON Take 20 mEq by mouth daily with breakfast.   ranitidine 150 MG tablet Commonly known as:  ZANTAC Take 150 mg by mouth 2 (two) times daily.   REFRESH CONTACTS DROPS Soln Place 1 drop into both eyes 4 (four) times daily.   Rivaroxaban 15 MG Tabs tablet Commonly known as:  XARELTO Take 15 mg by mouth daily with supper.   Saline 0.65 % (Soln) Soln Place 1 spray into both nostrils every 2 (two) hours as needed (for dry nares).   sertraline 50 MG tablet Commonly known as:  ZOLOFT   sucralfate 1 g tablet Commonly known as:  CARAFATE Take 1 g by mouth 4 (four) times daily.   SYSTANE 0.4-0.3 % Gel ophthalmic gel Generic drug:  Polyethyl Glycol-Propyl Glycol Place 1 application into both eyes at bedtime.   traZODone 50 MG tablet Commonly known as:  DESYREL Take 25 mg at bedtime by mouth.   triamcinolone cream 0.5 % Commonly known as:  KENALOG Apply 1 application topically 2 (two) times daily. Apply to legs for itching/rash   VASELINE Gel Generic drug:  white petrolatum Apply 1 application topically at bedtime.   VOLTAREN 1 % Gel Generic drug:  diclofenac sodium Apply 2 g topically as needed (for right hip and thigh pain). May use gel to right hip and thigh as needed for pain.       Past Medical History:  Diagnosis  Date  . Atrial fibrillation with RVR ([place]) 02/28/2017  . Blindness   . Cancer ([place])    breast  . Carotid artery occlusion   . Dementia   . Diabetes mellitus   . GERD (gastroesophageal reflux disease)   . Hyperlipidemia   . Hypertension   . Hypothyroidism   . Knee injury 2018  . Permanent atrial fibrillation ([place]) 06/30/2017  . Stroke [facility]) 2009 and  2011   difficulty with swallowing    Past Surgical History:  Procedure Laterality Date  . ABDOMINAL HYSTERECTOMY    . Arch Aortogram  03-15-2010  . MASTECTOMY,  PARTIAL Right   . ROTATOR CUFF REPAIR Left 1981    Review of systems negative except as noted in HPI / PMHx or noted below:  Review of Systems  Constitutional: Negative.   HENT: Negative.   Eyes: Negative.   Respiratory: Negative.   Cardiovascular: Negative.   Gastrointestinal: Negative.   Genitourinary: Negative.   Musculoskeletal: Negative.   Skin: Negative.   Neurological: Negative.   Endo/Heme/Allergies: Negative.   Psychiatric/Behavioral: Negative.      Objective:   Vitals:   01/02/18 1004  BP: 112/78  Pulse: 92  Resp: 20          Physical Exam  HENT:  Head: Normocephalic.  Right Ear: Tympanic membrane, external ear and ear canal normal.  Left Ear: Tympanic membrane, external ear and ear canal normal.  Nose: Nose normal. No mucosal edema or rhinorrhea.  Mouth/Throat: Uvula is midline, oropharynx is clear and moist and mucous membranes are normal. No oropharyngeal exudate.  Eyes: Conjunctivae are normal.  Neck: Trachea normal. No tracheal tenderness present. No tracheal deviation present. No thyromegaly present.  Cardiovascular: Normal rate, regular rhythm, S1 normal, S2 normal and normal heart sounds.  No murmur heard. Pulmonary/Chest: Breath sounds normal. No stridor. No respiratory distress. She has no wheezes. She has no rales.  Musculoskeletal: She exhibits no edema.  Lymphadenopathy:       Head (right side): No tonsillar adenopathy  present.       Head (left side): No tonsillar adenopathy present.    She has no cervical adenopathy.  Neurological: She is alert.  Skin: No rash noted. She is not diaphoretic. No erythema. Nails show no clubbing.    Diagnostics: none  Assessment and Plan:   1. Pruritic disorder   2. Other allergic rhinitis     1. Use a combination of the following medications every day:   A. Cetirizine 10mg  one tablet two times per day  B. Ranitidine 150 one tablet two times per day   C. loratadine 10 mg one tablet 2 times per day  D. Flonase - one spray each nostril two times per day  2. Continue moisturization of skin after bath / shower  3. Refer to [facility] Dermatology department for pruritic disorder.  4. Return to clinic in 6 months  Although Joannie is somewhat improved on a large collection of medical therapy noted above regarding her pruritic disorder she still is bothered with this issue.  She is already on gabapentin at 300 mg twice a day which should cover the possibility of neuropathic pruritis.  She is already using large amounts of antihistamines as noted above.  I think she needs to be seen by the academic [facility] regarding further evaluation of this issue.  She can continue on Flonase for her allergic rhinitis which appears to be working quite well.  Allena Katz, MD Allergy / Immunology [place]

## 2018-01-02 NOTE — Telephone Encounter (Signed)
Joanna Hill can you please schedule Refer to [facility] Dermatology department for pruritic disorder.

## 2018-01-03 ENCOUNTER — Encounter: Payer: Self-pay | Admitting: Allergy and Immunology

## 2018-01-03 DIAGNOSIS — G301 Alzheimer's disease with late onset: Secondary | ICD-10-CM | POA: Diagnosis not present

## 2018-01-03 DIAGNOSIS — F419 Anxiety disorder, unspecified: Secondary | ICD-10-CM | POA: Diagnosis not present

## 2018-01-03 NOTE — Telephone Encounter (Signed)
Patient was notified via [facility]

## 2018-01-03 NOTE — Telephone Encounter (Signed)
Patient is scheduled with [facility] Dermatology. [place]  [id number] [phone].  August 13 @ 1:15 Dr Saundra Shelling .  They will send the patient new patient paperwork. I will call to notify the patient of the date.

## 2018-01-10 DIAGNOSIS — F429 Obsessive-compulsive disorder, unspecified: Secondary | ICD-10-CM | POA: Diagnosis not present

## 2018-01-10 DIAGNOSIS — F330 Major depressive disorder, recurrent, mild: Secondary | ICD-10-CM | POA: Diagnosis not present

## 2018-01-10 DIAGNOSIS — F419 Anxiety disorder, unspecified: Secondary | ICD-10-CM | POA: Diagnosis not present

## 2018-01-10 DIAGNOSIS — G301 Alzheimer's disease with late onset: Secondary | ICD-10-CM | POA: Diagnosis not present

## 2018-01-10 DIAGNOSIS — G4700 Insomnia, unspecified: Secondary | ICD-10-CM | POA: Diagnosis not present

## 2018-01-10 DIAGNOSIS — F0280 Dementia in other diseases classified elsewhere without behavioral disturbance: Secondary | ICD-10-CM | POA: Diagnosis not present

## 2018-01-11 DIAGNOSIS — F0280 Dementia in other diseases classified elsewhere without behavioral disturbance: Secondary | ICD-10-CM | POA: Diagnosis not present

## 2018-01-11 DIAGNOSIS — G4700 Insomnia, unspecified: Secondary | ICD-10-CM | POA: Diagnosis not present

## 2018-01-11 DIAGNOSIS — F419 Anxiety disorder, unspecified: Secondary | ICD-10-CM | POA: Diagnosis not present

## 2018-01-11 DIAGNOSIS — G301 Alzheimer's disease with late onset: Secondary | ICD-10-CM | POA: Diagnosis not present

## 2018-01-11 DIAGNOSIS — F429 Obsessive-compulsive disorder, unspecified: Secondary | ICD-10-CM | POA: Diagnosis not present

## 2018-01-11 DIAGNOSIS — F330 Major depressive disorder, recurrent, mild: Secondary | ICD-10-CM | POA: Diagnosis not present

## 2018-01-16 DIAGNOSIS — G301 Alzheimer's disease with late onset: Secondary | ICD-10-CM | POA: Diagnosis not present

## 2018-01-16 DIAGNOSIS — F419 Anxiety disorder, unspecified: Secondary | ICD-10-CM | POA: Diagnosis not present

## 2018-01-23 DIAGNOSIS — E119 Type 2 diabetes mellitus without complications: Secondary | ICD-10-CM | POA: Diagnosis not present

## 2018-01-24 DIAGNOSIS — G301 Alzheimer's disease with late onset: Secondary | ICD-10-CM | POA: Diagnosis not present

## 2018-01-24 DIAGNOSIS — F419 Anxiety disorder, unspecified: Secondary | ICD-10-CM | POA: Diagnosis not present

## 2018-02-04 DIAGNOSIS — R05 Cough: Secondary | ICD-10-CM | POA: Diagnosis not present

## 2018-02-06 DIAGNOSIS — I4891 Unspecified atrial fibrillation: Secondary | ICD-10-CM | POA: Diagnosis not present

## 2018-02-06 DIAGNOSIS — I509 Heart failure, unspecified: Secondary | ICD-10-CM | POA: Diagnosis not present

## 2018-02-06 DIAGNOSIS — E119 Type 2 diabetes mellitus without complications: Secondary | ICD-10-CM | POA: Diagnosis not present

## 2018-02-06 DIAGNOSIS — J069 Acute upper respiratory infection, unspecified: Secondary | ICD-10-CM | POA: Diagnosis not present

## 2018-02-09 DIAGNOSIS — Q845 Enlarged and hypertrophic nails: Secondary | ICD-10-CM | POA: Diagnosis not present

## 2018-02-09 DIAGNOSIS — I739 Peripheral vascular disease, unspecified: Secondary | ICD-10-CM | POA: Diagnosis not present

## 2018-02-09 DIAGNOSIS — L603 Nail dystrophy: Secondary | ICD-10-CM | POA: Diagnosis not present

## 2018-02-14 DIAGNOSIS — H50111 Monocular exotropia, right eye: Secondary | ICD-10-CM | POA: Diagnosis not present

## 2018-02-14 DIAGNOSIS — H47211 Primary optic atrophy, right eye: Secondary | ICD-10-CM | POA: Diagnosis not present

## 2018-02-14 DIAGNOSIS — Z961 Presence of intraocular lens: Secondary | ICD-10-CM | POA: Diagnosis not present

## 2018-02-20 DIAGNOSIS — M6281 Muscle weakness (generalized): Secondary | ICD-10-CM | POA: Diagnosis not present

## 2018-02-20 DIAGNOSIS — R2689 Other abnormalities of gait and mobility: Secondary | ICD-10-CM | POA: Diagnosis not present

## 2018-02-20 DIAGNOSIS — I10 Essential (primary) hypertension: Secondary | ICD-10-CM | POA: Diagnosis not present

## 2018-02-20 DIAGNOSIS — R1312 Dysphagia, oropharyngeal phase: Secondary | ICD-10-CM | POA: Diagnosis not present

## 2018-02-20 DIAGNOSIS — J45909 Unspecified asthma, uncomplicated: Secondary | ICD-10-CM | POA: Diagnosis not present

## 2018-02-20 DIAGNOSIS — R293 Abnormal posture: Secondary | ICD-10-CM | POA: Diagnosis not present

## 2018-02-20 DIAGNOSIS — J1089 Influenza due to other identified influenza virus with other manifestations: Secondary | ICD-10-CM | POA: Diagnosis not present

## 2018-02-20 DIAGNOSIS — R296 Repeated falls: Secondary | ICD-10-CM | POA: Diagnosis not present

## 2018-02-20 DIAGNOSIS — Z5181 Encounter for therapeutic drug level monitoring: Secondary | ICD-10-CM | POA: Diagnosis not present

## 2018-02-20 DIAGNOSIS — R278 Other lack of coordination: Secondary | ICD-10-CM | POA: Diagnosis not present

## 2018-02-21 DIAGNOSIS — E119 Type 2 diabetes mellitus without complications: Secondary | ICD-10-CM | POA: Diagnosis not present

## 2018-02-21 DIAGNOSIS — Z5181 Encounter for therapeutic drug level monitoring: Secondary | ICD-10-CM | POA: Diagnosis not present

## 2018-02-21 DIAGNOSIS — R278 Other lack of coordination: Secondary | ICD-10-CM | POA: Diagnosis not present

## 2018-02-21 DIAGNOSIS — I4891 Unspecified atrial fibrillation: Secondary | ICD-10-CM | POA: Diagnosis not present

## 2018-02-21 DIAGNOSIS — R293 Abnormal posture: Secondary | ICD-10-CM | POA: Diagnosis not present

## 2018-02-21 DIAGNOSIS — F0390 Unspecified dementia without behavioral disturbance: Secondary | ICD-10-CM | POA: Diagnosis not present

## 2018-02-21 DIAGNOSIS — I10 Essential (primary) hypertension: Secondary | ICD-10-CM | POA: Diagnosis not present

## 2018-02-21 DIAGNOSIS — J1089 Influenza due to other identified influenza virus with other manifestations: Secondary | ICD-10-CM | POA: Diagnosis not present

## 2018-02-21 DIAGNOSIS — R2689 Other abnormalities of gait and mobility: Secondary | ICD-10-CM | POA: Diagnosis not present

## 2018-02-21 DIAGNOSIS — R296 Repeated falls: Secondary | ICD-10-CM | POA: Diagnosis not present

## 2018-02-22 DIAGNOSIS — R296 Repeated falls: Secondary | ICD-10-CM | POA: Diagnosis not present

## 2018-02-22 DIAGNOSIS — Z5181 Encounter for therapeutic drug level monitoring: Secondary | ICD-10-CM | POA: Diagnosis not present

## 2018-02-22 DIAGNOSIS — R278 Other lack of coordination: Secondary | ICD-10-CM | POA: Diagnosis not present

## 2018-02-22 DIAGNOSIS — J1089 Influenza due to other identified influenza virus with other manifestations: Secondary | ICD-10-CM | POA: Diagnosis not present

## 2018-02-22 DIAGNOSIS — R293 Abnormal posture: Secondary | ICD-10-CM | POA: Diagnosis not present

## 2018-02-22 DIAGNOSIS — R2689 Other abnormalities of gait and mobility: Secondary | ICD-10-CM | POA: Diagnosis not present

## 2018-02-23 DIAGNOSIS — R293 Abnormal posture: Secondary | ICD-10-CM | POA: Diagnosis not present

## 2018-02-23 DIAGNOSIS — R278 Other lack of coordination: Secondary | ICD-10-CM | POA: Diagnosis not present

## 2018-02-23 DIAGNOSIS — R2689 Other abnormalities of gait and mobility: Secondary | ICD-10-CM | POA: Diagnosis not present

## 2018-02-23 DIAGNOSIS — J1089 Influenza due to other identified influenza virus with other manifestations: Secondary | ICD-10-CM | POA: Diagnosis not present

## 2018-02-23 DIAGNOSIS — Z5181 Encounter for therapeutic drug level monitoring: Secondary | ICD-10-CM | POA: Diagnosis not present

## 2018-02-23 DIAGNOSIS — R296 Repeated falls: Secondary | ICD-10-CM | POA: Diagnosis not present

## 2018-02-26 DIAGNOSIS — R2689 Other abnormalities of gait and mobility: Secondary | ICD-10-CM | POA: Diagnosis not present

## 2018-02-26 DIAGNOSIS — Z5181 Encounter for therapeutic drug level monitoring: Secondary | ICD-10-CM | POA: Diagnosis not present

## 2018-02-26 DIAGNOSIS — R278 Other lack of coordination: Secondary | ICD-10-CM | POA: Diagnosis not present

## 2018-02-26 DIAGNOSIS — R293 Abnormal posture: Secondary | ICD-10-CM | POA: Diagnosis not present

## 2018-02-26 DIAGNOSIS — R296 Repeated falls: Secondary | ICD-10-CM | POA: Diagnosis not present

## 2018-02-26 DIAGNOSIS — J1089 Influenza due to other identified influenza virus with other manifestations: Secondary | ICD-10-CM | POA: Diagnosis not present

## 2018-02-27 DIAGNOSIS — Z5181 Encounter for therapeutic drug level monitoring: Secondary | ICD-10-CM | POA: Diagnosis not present

## 2018-02-27 DIAGNOSIS — R296 Repeated falls: Secondary | ICD-10-CM | POA: Diagnosis not present

## 2018-02-27 DIAGNOSIS — R278 Other lack of coordination: Secondary | ICD-10-CM | POA: Diagnosis not present

## 2018-02-27 DIAGNOSIS — R293 Abnormal posture: Secondary | ICD-10-CM | POA: Diagnosis not present

## 2018-02-27 DIAGNOSIS — R2689 Other abnormalities of gait and mobility: Secondary | ICD-10-CM | POA: Diagnosis not present

## 2018-02-27 DIAGNOSIS — J1089 Influenza due to other identified influenza virus with other manifestations: Secondary | ICD-10-CM | POA: Diagnosis not present

## 2018-02-28 DIAGNOSIS — R278 Other lack of coordination: Secondary | ICD-10-CM | POA: Diagnosis not present

## 2018-02-28 DIAGNOSIS — R2689 Other abnormalities of gait and mobility: Secondary | ICD-10-CM | POA: Diagnosis not present

## 2018-02-28 DIAGNOSIS — R293 Abnormal posture: Secondary | ICD-10-CM | POA: Diagnosis not present

## 2018-02-28 DIAGNOSIS — J1089 Influenza due to other identified influenza virus with other manifestations: Secondary | ICD-10-CM | POA: Diagnosis not present

## 2018-02-28 DIAGNOSIS — Z5181 Encounter for therapeutic drug level monitoring: Secondary | ICD-10-CM | POA: Diagnosis not present

## 2018-02-28 DIAGNOSIS — R296 Repeated falls: Secondary | ICD-10-CM | POA: Diagnosis not present

## 2018-03-01 DIAGNOSIS — R296 Repeated falls: Secondary | ICD-10-CM | POA: Diagnosis not present

## 2018-03-01 DIAGNOSIS — J1089 Influenza due to other identified influenza virus with other manifestations: Secondary | ICD-10-CM | POA: Diagnosis not present

## 2018-03-01 DIAGNOSIS — R2689 Other abnormalities of gait and mobility: Secondary | ICD-10-CM | POA: Diagnosis not present

## 2018-03-01 DIAGNOSIS — R293 Abnormal posture: Secondary | ICD-10-CM | POA: Diagnosis not present

## 2018-03-01 DIAGNOSIS — R278 Other lack of coordination: Secondary | ICD-10-CM | POA: Diagnosis not present

## 2018-03-01 DIAGNOSIS — Z5181 Encounter for therapeutic drug level monitoring: Secondary | ICD-10-CM | POA: Diagnosis not present

## 2018-03-05 DIAGNOSIS — R2689 Other abnormalities of gait and mobility: Secondary | ICD-10-CM | POA: Diagnosis not present

## 2018-03-05 DIAGNOSIS — R293 Abnormal posture: Secondary | ICD-10-CM | POA: Diagnosis not present

## 2018-03-05 DIAGNOSIS — R278 Other lack of coordination: Secondary | ICD-10-CM | POA: Diagnosis not present

## 2018-03-05 DIAGNOSIS — Z5181 Encounter for therapeutic drug level monitoring: Secondary | ICD-10-CM | POA: Diagnosis not present

## 2018-03-05 DIAGNOSIS — R296 Repeated falls: Secondary | ICD-10-CM | POA: Diagnosis not present

## 2018-03-05 DIAGNOSIS — J1089 Influenza due to other identified influenza virus with other manifestations: Secondary | ICD-10-CM | POA: Diagnosis not present

## 2018-03-06 DIAGNOSIS — R296 Repeated falls: Secondary | ICD-10-CM | POA: Diagnosis not present

## 2018-03-06 DIAGNOSIS — Z5181 Encounter for therapeutic drug level monitoring: Secondary | ICD-10-CM | POA: Diagnosis not present

## 2018-03-06 DIAGNOSIS — R293 Abnormal posture: Secondary | ICD-10-CM | POA: Diagnosis not present

## 2018-03-06 DIAGNOSIS — R278 Other lack of coordination: Secondary | ICD-10-CM | POA: Diagnosis not present

## 2018-03-06 DIAGNOSIS — J1089 Influenza due to other identified influenza virus with other manifestations: Secondary | ICD-10-CM | POA: Diagnosis not present

## 2018-03-06 DIAGNOSIS — R2689 Other abnormalities of gait and mobility: Secondary | ICD-10-CM | POA: Diagnosis not present

## 2018-03-07 DIAGNOSIS — J1089 Influenza due to other identified influenza virus with other manifestations: Secondary | ICD-10-CM | POA: Diagnosis not present

## 2018-03-07 DIAGNOSIS — G4700 Insomnia, unspecified: Secondary | ICD-10-CM | POA: Diagnosis not present

## 2018-03-07 DIAGNOSIS — F428 Other obsessive-compulsive disorder: Secondary | ICD-10-CM | POA: Diagnosis not present

## 2018-03-07 DIAGNOSIS — Z5181 Encounter for therapeutic drug level monitoring: Secondary | ICD-10-CM | POA: Diagnosis not present

## 2018-03-07 DIAGNOSIS — R278 Other lack of coordination: Secondary | ICD-10-CM | POA: Diagnosis not present

## 2018-03-07 DIAGNOSIS — F0280 Dementia in other diseases classified elsewhere without behavioral disturbance: Secondary | ICD-10-CM | POA: Diagnosis not present

## 2018-03-07 DIAGNOSIS — F330 Major depressive disorder, recurrent, mild: Secondary | ICD-10-CM | POA: Diagnosis not present

## 2018-03-07 DIAGNOSIS — R2689 Other abnormalities of gait and mobility: Secondary | ICD-10-CM | POA: Diagnosis not present

## 2018-03-07 DIAGNOSIS — R296 Repeated falls: Secondary | ICD-10-CM | POA: Diagnosis not present

## 2018-03-07 DIAGNOSIS — R293 Abnormal posture: Secondary | ICD-10-CM | POA: Diagnosis not present

## 2018-03-07 DIAGNOSIS — G301 Alzheimer's disease with late onset: Secondary | ICD-10-CM | POA: Diagnosis not present

## 2018-03-08 DIAGNOSIS — R2689 Other abnormalities of gait and mobility: Secondary | ICD-10-CM | POA: Diagnosis not present

## 2018-03-08 DIAGNOSIS — R278 Other lack of coordination: Secondary | ICD-10-CM | POA: Diagnosis not present

## 2018-03-08 DIAGNOSIS — R296 Repeated falls: Secondary | ICD-10-CM | POA: Diagnosis not present

## 2018-03-08 DIAGNOSIS — Z5181 Encounter for therapeutic drug level monitoring: Secondary | ICD-10-CM | POA: Diagnosis not present

## 2018-03-08 DIAGNOSIS — R293 Abnormal posture: Secondary | ICD-10-CM | POA: Diagnosis not present

## 2018-03-08 DIAGNOSIS — J1089 Influenza due to other identified influenza virus with other manifestations: Secondary | ICD-10-CM | POA: Diagnosis not present

## 2018-03-09 DIAGNOSIS — J1089 Influenza due to other identified influenza virus with other manifestations: Secondary | ICD-10-CM | POA: Diagnosis not present

## 2018-03-09 DIAGNOSIS — Z5181 Encounter for therapeutic drug level monitoring: Secondary | ICD-10-CM | POA: Diagnosis not present

## 2018-03-09 DIAGNOSIS — R2689 Other abnormalities of gait and mobility: Secondary | ICD-10-CM | POA: Diagnosis not present

## 2018-03-09 DIAGNOSIS — R296 Repeated falls: Secondary | ICD-10-CM | POA: Diagnosis not present

## 2018-03-09 DIAGNOSIS — R293 Abnormal posture: Secondary | ICD-10-CM | POA: Diagnosis not present

## 2018-03-09 DIAGNOSIS — R278 Other lack of coordination: Secondary | ICD-10-CM | POA: Diagnosis not present

## 2018-03-12 DIAGNOSIS — J1089 Influenza due to other identified influenza virus with other manifestations: Secondary | ICD-10-CM | POA: Diagnosis not present

## 2018-03-12 DIAGNOSIS — R278 Other lack of coordination: Secondary | ICD-10-CM | POA: Diagnosis not present

## 2018-03-12 DIAGNOSIS — Z5181 Encounter for therapeutic drug level monitoring: Secondary | ICD-10-CM | POA: Diagnosis not present

## 2018-03-12 DIAGNOSIS — R2689 Other abnormalities of gait and mobility: Secondary | ICD-10-CM | POA: Diagnosis not present

## 2018-03-12 DIAGNOSIS — R293 Abnormal posture: Secondary | ICD-10-CM | POA: Diagnosis not present

## 2018-03-12 DIAGNOSIS — R296 Repeated falls: Secondary | ICD-10-CM | POA: Diagnosis not present

## 2018-03-13 DIAGNOSIS — R278 Other lack of coordination: Secondary | ICD-10-CM | POA: Diagnosis not present

## 2018-03-13 DIAGNOSIS — R293 Abnormal posture: Secondary | ICD-10-CM | POA: Diagnosis not present

## 2018-03-13 DIAGNOSIS — Z5181 Encounter for therapeutic drug level monitoring: Secondary | ICD-10-CM | POA: Diagnosis not present

## 2018-03-13 DIAGNOSIS — J1089 Influenza due to other identified influenza virus with other manifestations: Secondary | ICD-10-CM | POA: Diagnosis not present

## 2018-03-13 DIAGNOSIS — R2689 Other abnormalities of gait and mobility: Secondary | ICD-10-CM | POA: Diagnosis not present

## 2018-03-13 DIAGNOSIS — R296 Repeated falls: Secondary | ICD-10-CM | POA: Diagnosis not present

## 2018-03-14 DIAGNOSIS — R293 Abnormal posture: Secondary | ICD-10-CM | POA: Diagnosis not present

## 2018-03-14 DIAGNOSIS — Z5181 Encounter for therapeutic drug level monitoring: Secondary | ICD-10-CM | POA: Diagnosis not present

## 2018-03-14 DIAGNOSIS — R2689 Other abnormalities of gait and mobility: Secondary | ICD-10-CM | POA: Diagnosis not present

## 2018-03-14 DIAGNOSIS — R296 Repeated falls: Secondary | ICD-10-CM | POA: Diagnosis not present

## 2018-03-14 DIAGNOSIS — R278 Other lack of coordination: Secondary | ICD-10-CM | POA: Diagnosis not present

## 2018-03-14 DIAGNOSIS — J1089 Influenza due to other identified influenza virus with other manifestations: Secondary | ICD-10-CM | POA: Diagnosis not present

## 2018-03-15 DIAGNOSIS — J45909 Unspecified asthma, uncomplicated: Secondary | ICD-10-CM | POA: Diagnosis not present

## 2018-03-15 DIAGNOSIS — M6281 Muscle weakness (generalized): Secondary | ICD-10-CM | POA: Diagnosis not present

## 2018-03-15 DIAGNOSIS — I10 Essential (primary) hypertension: Secondary | ICD-10-CM | POA: Diagnosis not present

## 2018-03-15 DIAGNOSIS — Z5181 Encounter for therapeutic drug level monitoring: Secondary | ICD-10-CM | POA: Diagnosis not present

## 2018-03-15 DIAGNOSIS — R1312 Dysphagia, oropharyngeal phase: Secondary | ICD-10-CM | POA: Diagnosis not present

## 2018-03-15 DIAGNOSIS — R278 Other lack of coordination: Secondary | ICD-10-CM | POA: Diagnosis not present

## 2018-03-15 DIAGNOSIS — R296 Repeated falls: Secondary | ICD-10-CM | POA: Diagnosis not present

## 2018-03-15 DIAGNOSIS — J1089 Influenza due to other identified influenza virus with other manifestations: Secondary | ICD-10-CM | POA: Diagnosis not present

## 2018-03-15 DIAGNOSIS — R2689 Other abnormalities of gait and mobility: Secondary | ICD-10-CM | POA: Diagnosis not present

## 2018-03-15 DIAGNOSIS — R293 Abnormal posture: Secondary | ICD-10-CM | POA: Diagnosis not present

## 2018-03-16 DIAGNOSIS — R2689 Other abnormalities of gait and mobility: Secondary | ICD-10-CM | POA: Diagnosis not present

## 2018-03-16 DIAGNOSIS — Z5181 Encounter for therapeutic drug level monitoring: Secondary | ICD-10-CM | POA: Diagnosis not present

## 2018-03-16 DIAGNOSIS — R278 Other lack of coordination: Secondary | ICD-10-CM | POA: Diagnosis not present

## 2018-03-16 DIAGNOSIS — R296 Repeated falls: Secondary | ICD-10-CM | POA: Diagnosis not present

## 2018-03-16 DIAGNOSIS — R293 Abnormal posture: Secondary | ICD-10-CM | POA: Diagnosis not present

## 2018-03-16 DIAGNOSIS — J1089 Influenza due to other identified influenza virus with other manifestations: Secondary | ICD-10-CM | POA: Diagnosis not present

## 2018-03-19 DIAGNOSIS — R296 Repeated falls: Secondary | ICD-10-CM | POA: Diagnosis not present

## 2018-03-19 DIAGNOSIS — R278 Other lack of coordination: Secondary | ICD-10-CM | POA: Diagnosis not present

## 2018-03-19 DIAGNOSIS — R2689 Other abnormalities of gait and mobility: Secondary | ICD-10-CM | POA: Diagnosis not present

## 2018-03-19 DIAGNOSIS — R293 Abnormal posture: Secondary | ICD-10-CM | POA: Diagnosis not present

## 2018-03-19 DIAGNOSIS — Z5181 Encounter for therapeutic drug level monitoring: Secondary | ICD-10-CM | POA: Diagnosis not present

## 2018-03-19 DIAGNOSIS — J1089 Influenza due to other identified influenza virus with other manifestations: Secondary | ICD-10-CM | POA: Diagnosis not present

## 2018-03-20 DIAGNOSIS — R293 Abnormal posture: Secondary | ICD-10-CM | POA: Diagnosis not present

## 2018-03-20 DIAGNOSIS — R278 Other lack of coordination: Secondary | ICD-10-CM | POA: Diagnosis not present

## 2018-03-20 DIAGNOSIS — R2689 Other abnormalities of gait and mobility: Secondary | ICD-10-CM | POA: Diagnosis not present

## 2018-03-20 DIAGNOSIS — R296 Repeated falls: Secondary | ICD-10-CM | POA: Diagnosis not present

## 2018-03-20 DIAGNOSIS — Z5181 Encounter for therapeutic drug level monitoring: Secondary | ICD-10-CM | POA: Diagnosis not present

## 2018-03-20 DIAGNOSIS — J1089 Influenza due to other identified influenza virus with other manifestations: Secondary | ICD-10-CM | POA: Diagnosis not present

## 2018-03-21 ENCOUNTER — Ambulatory Visit (HOSPITAL_COMMUNITY)
Admission: RE | Admit: 2018-03-21 | Discharge: 2018-03-21 | Disposition: A | Discharge status: Home / Self Care | Payer: Medicare Other | Source: Ambulatory Visit | Attending: Internal Medicine | Admitting: Internal Medicine

## 2018-03-21 DIAGNOSIS — J1089 Influenza due to other identified influenza virus with other manifestations: Secondary | ICD-10-CM | POA: Diagnosis not present

## 2018-03-21 DIAGNOSIS — R2689 Other abnormalities of gait and mobility: Secondary | ICD-10-CM | POA: Diagnosis not present

## 2018-03-21 DIAGNOSIS — I6523 Occlusion and stenosis of bilateral carotid arteries: Secondary | ICD-10-CM | POA: Diagnosis not present

## 2018-03-21 DIAGNOSIS — R296 Repeated falls: Secondary | ICD-10-CM | POA: Diagnosis not present

## 2018-03-21 DIAGNOSIS — Z5181 Encounter for therapeutic drug level monitoring: Secondary | ICD-10-CM | POA: Diagnosis not present

## 2018-03-21 DIAGNOSIS — R278 Other lack of coordination: Secondary | ICD-10-CM | POA: Diagnosis not present

## 2018-03-21 DIAGNOSIS — R293 Abnormal posture: Secondary | ICD-10-CM | POA: Diagnosis not present

## 2018-03-27 DIAGNOSIS — F458 Other somatoform disorders: Secondary | ICD-10-CM | POA: Diagnosis not present

## 2018-03-27 DIAGNOSIS — L853 Xerosis cutis: Secondary | ICD-10-CM | POA: Diagnosis not present

## 2018-04-04 DIAGNOSIS — G4700 Insomnia, unspecified: Secondary | ICD-10-CM | POA: Diagnosis not present

## 2018-04-04 DIAGNOSIS — F330 Major depressive disorder, recurrent, mild: Secondary | ICD-10-CM | POA: Diagnosis not present

## 2018-04-04 DIAGNOSIS — G301 Alzheimer's disease with late onset: Secondary | ICD-10-CM | POA: Diagnosis not present

## 2018-04-04 DIAGNOSIS — F428 Other obsessive-compulsive disorder: Secondary | ICD-10-CM | POA: Diagnosis not present

## 2018-04-04 DIAGNOSIS — F0280 Dementia in other diseases classified elsewhere without behavioral disturbance: Secondary | ICD-10-CM | POA: Diagnosis not present

## 2018-04-11 DIAGNOSIS — E039 Hypothyroidism, unspecified: Secondary | ICD-10-CM | POA: Diagnosis not present

## 2018-04-11 DIAGNOSIS — I4891 Unspecified atrial fibrillation: Secondary | ICD-10-CM | POA: Diagnosis not present

## 2018-04-11 DIAGNOSIS — N183 Chronic kidney disease, stage 3 (moderate): Secondary | ICD-10-CM | POA: Diagnosis not present

## 2018-04-11 DIAGNOSIS — I10 Essential (primary) hypertension: Secondary | ICD-10-CM | POA: Diagnosis not present

## 2018-04-11 DIAGNOSIS — F0390 Unspecified dementia without behavioral disturbance: Secondary | ICD-10-CM | POA: Diagnosis not present

## 2018-04-11 DIAGNOSIS — E1122 Type 2 diabetes mellitus with diabetic chronic kidney disease: Secondary | ICD-10-CM | POA: Diagnosis not present

## 2018-04-12 DIAGNOSIS — G4700 Insomnia, unspecified: Secondary | ICD-10-CM | POA: Diagnosis not present

## 2018-04-12 DIAGNOSIS — F330 Major depressive disorder, recurrent, mild: Secondary | ICD-10-CM | POA: Diagnosis not present

## 2018-04-12 DIAGNOSIS — F419 Anxiety disorder, unspecified: Secondary | ICD-10-CM | POA: Diagnosis not present

## 2018-04-12 DIAGNOSIS — G301 Alzheimer's disease with late onset: Secondary | ICD-10-CM | POA: Diagnosis not present

## 2018-04-12 DIAGNOSIS — F0280 Dementia in other diseases classified elsewhere without behavioral disturbance: Secondary | ICD-10-CM | POA: Diagnosis not present

## 2018-04-12 DIAGNOSIS — F429 Obsessive-compulsive disorder, unspecified: Secondary | ICD-10-CM | POA: Diagnosis not present

## 2018-04-19 DIAGNOSIS — I739 Peripheral vascular disease, unspecified: Secondary | ICD-10-CM | POA: Diagnosis not present

## 2018-04-19 DIAGNOSIS — Q845 Enlarged and hypertrophic nails: Secondary | ICD-10-CM | POA: Diagnosis not present

## 2018-04-19 DIAGNOSIS — E1159 Type 2 diabetes mellitus with other circulatory complications: Secondary | ICD-10-CM | POA: Diagnosis not present

## 2018-04-19 DIAGNOSIS — L603 Nail dystrophy: Secondary | ICD-10-CM | POA: Diagnosis not present

## 2018-05-09 DIAGNOSIS — G301 Alzheimer's disease with late onset: Secondary | ICD-10-CM | POA: Diagnosis not present

## 2018-05-09 DIAGNOSIS — F0280 Dementia in other diseases classified elsewhere without behavioral disturbance: Secondary | ICD-10-CM | POA: Diagnosis not present

## 2018-05-09 DIAGNOSIS — F330 Major depressive disorder, recurrent, mild: Secondary | ICD-10-CM | POA: Diagnosis not present

## 2018-05-09 DIAGNOSIS — F428 Other obsessive-compulsive disorder: Secondary | ICD-10-CM | POA: Diagnosis not present

## 2018-05-09 DIAGNOSIS — G4700 Insomnia, unspecified: Secondary | ICD-10-CM | POA: Diagnosis not present

## 2018-05-15 DIAGNOSIS — H1013 Acute atopic conjunctivitis, bilateral: Secondary | ICD-10-CM | POA: Diagnosis not present

## 2018-05-15 DIAGNOSIS — H401123 Primary open-angle glaucoma, left eye, severe stage: Secondary | ICD-10-CM | POA: Diagnosis not present

## 2018-05-15 DIAGNOSIS — H401114 Primary open-angle glaucoma, right eye, indeterminate stage: Secondary | ICD-10-CM | POA: Diagnosis not present

## 2018-05-15 DIAGNOSIS — H40043 Steroid responder, bilateral: Secondary | ICD-10-CM | POA: Diagnosis not present

## 2018-05-21 DIAGNOSIS — I10 Essential (primary) hypertension: Secondary | ICD-10-CM | POA: Diagnosis not present

## 2018-05-21 DIAGNOSIS — E1121 Type 2 diabetes mellitus with diabetic nephropathy: Secondary | ICD-10-CM | POA: Diagnosis not present

## 2018-05-21 DIAGNOSIS — D649 Anemia, unspecified: Secondary | ICD-10-CM | POA: Diagnosis not present

## 2018-05-21 DIAGNOSIS — E119 Type 2 diabetes mellitus without complications: Secondary | ICD-10-CM | POA: Diagnosis not present

## 2018-05-21 DIAGNOSIS — D6489 Other specified anemias: Secondary | ICD-10-CM | POA: Diagnosis not present

## 2018-05-21 DIAGNOSIS — E785 Hyperlipidemia, unspecified: Secondary | ICD-10-CM | POA: Diagnosis not present

## 2018-05-21 DIAGNOSIS — E7849 Other hyperlipidemia: Secondary | ICD-10-CM | POA: Diagnosis not present

## 2018-05-22 DIAGNOSIS — I4891 Unspecified atrial fibrillation: Secondary | ICD-10-CM | POA: Diagnosis not present

## 2018-05-22 DIAGNOSIS — E119 Type 2 diabetes mellitus without complications: Secondary | ICD-10-CM | POA: Diagnosis not present

## 2018-05-22 DIAGNOSIS — I509 Heart failure, unspecified: Secondary | ICD-10-CM | POA: Diagnosis not present

## 2018-05-22 DIAGNOSIS — I10 Essential (primary) hypertension: Secondary | ICD-10-CM | POA: Diagnosis not present

## 2018-05-22 DIAGNOSIS — M25511 Pain in right shoulder: Secondary | ICD-10-CM | POA: Diagnosis not present

## 2018-05-23 DIAGNOSIS — E119 Type 2 diabetes mellitus without complications: Secondary | ICD-10-CM | POA: Diagnosis not present

## 2018-05-23 DIAGNOSIS — I509 Heart failure, unspecified: Secondary | ICD-10-CM | POA: Diagnosis not present

## 2018-05-23 DIAGNOSIS — I4891 Unspecified atrial fibrillation: Secondary | ICD-10-CM | POA: Diagnosis not present

## 2018-05-23 DIAGNOSIS — M25511 Pain in right shoulder: Secondary | ICD-10-CM | POA: Diagnosis not present

## 2018-05-30 DIAGNOSIS — R0989 Other specified symptoms and signs involving the circulatory and respiratory systems: Secondary | ICD-10-CM | POA: Diagnosis not present

## 2018-05-30 DIAGNOSIS — I10 Essential (primary) hypertension: Secondary | ICD-10-CM | POA: Diagnosis not present

## 2018-05-30 DIAGNOSIS — F339 Major depressive disorder, recurrent, unspecified: Secondary | ICD-10-CM | POA: Diagnosis not present

## 2018-05-30 DIAGNOSIS — R0602 Shortness of breath: Secondary | ICD-10-CM | POA: Diagnosis not present

## 2018-05-30 DIAGNOSIS — I4891 Unspecified atrial fibrillation: Secondary | ICD-10-CM | POA: Diagnosis not present

## 2018-05-30 DIAGNOSIS — F0390 Unspecified dementia without behavioral disturbance: Secondary | ICD-10-CM | POA: Diagnosis not present

## 2018-05-30 DIAGNOSIS — E039 Hypothyroidism, unspecified: Secondary | ICD-10-CM | POA: Diagnosis not present

## 2018-06-04 DIAGNOSIS — Z5181 Encounter for therapeutic drug level monitoring: Secondary | ICD-10-CM | POA: Diagnosis not present

## 2018-06-04 DIAGNOSIS — R1312 Dysphagia, oropharyngeal phase: Secondary | ICD-10-CM | POA: Diagnosis not present

## 2018-06-04 DIAGNOSIS — E119 Type 2 diabetes mellitus without complications: Secondary | ICD-10-CM | POA: Diagnosis not present

## 2018-06-04 DIAGNOSIS — D649 Anemia, unspecified: Secondary | ICD-10-CM | POA: Diagnosis not present

## 2018-06-04 DIAGNOSIS — R251 Tremor, unspecified: Secondary | ICD-10-CM | POA: Diagnosis not present

## 2018-06-04 DIAGNOSIS — R296 Repeated falls: Secondary | ICD-10-CM | POA: Diagnosis not present

## 2018-06-04 DIAGNOSIS — J1089 Influenza due to other identified influenza virus with other manifestations: Secondary | ICD-10-CM | POA: Diagnosis not present

## 2018-06-04 DIAGNOSIS — J45909 Unspecified asthma, uncomplicated: Secondary | ICD-10-CM | POA: Diagnosis not present

## 2018-06-04 DIAGNOSIS — L299 Pruritus, unspecified: Secondary | ICD-10-CM | POA: Diagnosis not present

## 2018-06-04 DIAGNOSIS — R278 Other lack of coordination: Secondary | ICD-10-CM | POA: Diagnosis not present

## 2018-06-04 DIAGNOSIS — I10 Essential (primary) hypertension: Secondary | ICD-10-CM | POA: Diagnosis not present

## 2018-06-04 DIAGNOSIS — Z79899 Other long term (current) drug therapy: Secondary | ICD-10-CM | POA: Diagnosis not present

## 2018-06-04 DIAGNOSIS — R2689 Other abnormalities of gait and mobility: Secondary | ICD-10-CM | POA: Diagnosis not present

## 2018-06-04 DIAGNOSIS — R293 Abnormal posture: Secondary | ICD-10-CM | POA: Diagnosis not present

## 2018-06-04 DIAGNOSIS — M6281 Muscle weakness (generalized): Secondary | ICD-10-CM | POA: Diagnosis not present

## 2018-06-05 DIAGNOSIS — R293 Abnormal posture: Secondary | ICD-10-CM | POA: Diagnosis not present

## 2018-06-05 DIAGNOSIS — R319 Hematuria, unspecified: Secondary | ICD-10-CM | POA: Diagnosis not present

## 2018-06-05 DIAGNOSIS — J1089 Influenza due to other identified influenza virus with other manifestations: Secondary | ICD-10-CM | POA: Diagnosis not present

## 2018-06-05 DIAGNOSIS — R296 Repeated falls: Secondary | ICD-10-CM | POA: Diagnosis not present

## 2018-06-05 DIAGNOSIS — R2689 Other abnormalities of gait and mobility: Secondary | ICD-10-CM | POA: Diagnosis not present

## 2018-06-05 DIAGNOSIS — N390 Urinary tract infection, site not specified: Secondary | ICD-10-CM | POA: Diagnosis not present

## 2018-06-05 DIAGNOSIS — R278 Other lack of coordination: Secondary | ICD-10-CM | POA: Diagnosis not present

## 2018-06-05 DIAGNOSIS — Z5181 Encounter for therapeutic drug level monitoring: Secondary | ICD-10-CM | POA: Diagnosis not present

## 2018-06-06 DIAGNOSIS — F330 Major depressive disorder, recurrent, mild: Secondary | ICD-10-CM | POA: Diagnosis not present

## 2018-06-06 DIAGNOSIS — F0280 Dementia in other diseases classified elsewhere without behavioral disturbance: Secondary | ICD-10-CM | POA: Diagnosis not present

## 2018-06-06 DIAGNOSIS — R293 Abnormal posture: Secondary | ICD-10-CM | POA: Diagnosis not present

## 2018-06-06 DIAGNOSIS — R278 Other lack of coordination: Secondary | ICD-10-CM | POA: Diagnosis not present

## 2018-06-06 DIAGNOSIS — J1089 Influenza due to other identified influenza virus with other manifestations: Secondary | ICD-10-CM | POA: Diagnosis not present

## 2018-06-06 DIAGNOSIS — Z5181 Encounter for therapeutic drug level monitoring: Secondary | ICD-10-CM | POA: Diagnosis not present

## 2018-06-06 DIAGNOSIS — G4700 Insomnia, unspecified: Secondary | ICD-10-CM | POA: Diagnosis not present

## 2018-06-06 DIAGNOSIS — R2689 Other abnormalities of gait and mobility: Secondary | ICD-10-CM | POA: Diagnosis not present

## 2018-06-06 DIAGNOSIS — G301 Alzheimer's disease with late onset: Secondary | ICD-10-CM | POA: Diagnosis not present

## 2018-06-06 DIAGNOSIS — F428 Other obsessive-compulsive disorder: Secondary | ICD-10-CM | POA: Diagnosis not present

## 2018-06-06 DIAGNOSIS — R296 Repeated falls: Secondary | ICD-10-CM | POA: Diagnosis not present

## 2018-06-07 DIAGNOSIS — J1089 Influenza due to other identified influenza virus with other manifestations: Secondary | ICD-10-CM | POA: Diagnosis not present

## 2018-06-07 DIAGNOSIS — R296 Repeated falls: Secondary | ICD-10-CM | POA: Diagnosis not present

## 2018-06-07 DIAGNOSIS — Z5181 Encounter for therapeutic drug level monitoring: Secondary | ICD-10-CM | POA: Diagnosis not present

## 2018-06-07 DIAGNOSIS — R2689 Other abnormalities of gait and mobility: Secondary | ICD-10-CM | POA: Diagnosis not present

## 2018-06-07 DIAGNOSIS — R293 Abnormal posture: Secondary | ICD-10-CM | POA: Diagnosis not present

## 2018-06-07 DIAGNOSIS — R278 Other lack of coordination: Secondary | ICD-10-CM | POA: Diagnosis not present

## 2018-06-08 DIAGNOSIS — J1089 Influenza due to other identified influenza virus with other manifestations: Secondary | ICD-10-CM | POA: Diagnosis not present

## 2018-06-08 DIAGNOSIS — I509 Heart failure, unspecified: Secondary | ICD-10-CM | POA: Diagnosis not present

## 2018-06-08 DIAGNOSIS — R293 Abnormal posture: Secondary | ICD-10-CM | POA: Diagnosis not present

## 2018-06-08 DIAGNOSIS — I10 Essential (primary) hypertension: Secondary | ICD-10-CM | POA: Diagnosis not present

## 2018-06-08 DIAGNOSIS — I890 Lymphedema, not elsewhere classified: Secondary | ICD-10-CM | POA: Diagnosis not present

## 2018-06-08 DIAGNOSIS — R296 Repeated falls: Secondary | ICD-10-CM | POA: Diagnosis not present

## 2018-06-08 DIAGNOSIS — E119 Type 2 diabetes mellitus without complications: Secondary | ICD-10-CM | POA: Diagnosis not present

## 2018-06-08 DIAGNOSIS — R2689 Other abnormalities of gait and mobility: Secondary | ICD-10-CM | POA: Diagnosis not present

## 2018-06-08 DIAGNOSIS — Z5181 Encounter for therapeutic drug level monitoring: Secondary | ICD-10-CM | POA: Diagnosis not present

## 2018-06-08 DIAGNOSIS — R278 Other lack of coordination: Secondary | ICD-10-CM | POA: Diagnosis not present

## 2018-06-11 DIAGNOSIS — Z5181 Encounter for therapeutic drug level monitoring: Secondary | ICD-10-CM | POA: Diagnosis not present

## 2018-06-11 DIAGNOSIS — R293 Abnormal posture: Secondary | ICD-10-CM | POA: Diagnosis not present

## 2018-06-11 DIAGNOSIS — R278 Other lack of coordination: Secondary | ICD-10-CM | POA: Diagnosis not present

## 2018-06-11 DIAGNOSIS — J1089 Influenza due to other identified influenza virus with other manifestations: Secondary | ICD-10-CM | POA: Diagnosis not present

## 2018-06-11 DIAGNOSIS — R296 Repeated falls: Secondary | ICD-10-CM | POA: Diagnosis not present

## 2018-06-11 DIAGNOSIS — R2689 Other abnormalities of gait and mobility: Secondary | ICD-10-CM | POA: Diagnosis not present

## 2018-06-12 DIAGNOSIS — R2689 Other abnormalities of gait and mobility: Secondary | ICD-10-CM | POA: Diagnosis not present

## 2018-06-12 DIAGNOSIS — R278 Other lack of coordination: Secondary | ICD-10-CM | POA: Diagnosis not present

## 2018-06-12 DIAGNOSIS — R296 Repeated falls: Secondary | ICD-10-CM | POA: Diagnosis not present

## 2018-06-12 DIAGNOSIS — R293 Abnormal posture: Secondary | ICD-10-CM | POA: Diagnosis not present

## 2018-06-12 DIAGNOSIS — J1089 Influenza due to other identified influenza virus with other manifestations: Secondary | ICD-10-CM | POA: Diagnosis not present

## 2018-06-12 DIAGNOSIS — Z5181 Encounter for therapeutic drug level monitoring: Secondary | ICD-10-CM | POA: Diagnosis not present

## 2018-06-13 DIAGNOSIS — R2689 Other abnormalities of gait and mobility: Secondary | ICD-10-CM | POA: Diagnosis not present

## 2018-06-13 DIAGNOSIS — R293 Abnormal posture: Secondary | ICD-10-CM | POA: Diagnosis not present

## 2018-06-13 DIAGNOSIS — J1089 Influenza due to other identified influenza virus with other manifestations: Secondary | ICD-10-CM | POA: Diagnosis not present

## 2018-06-13 DIAGNOSIS — R296 Repeated falls: Secondary | ICD-10-CM | POA: Diagnosis not present

## 2018-06-13 DIAGNOSIS — Z5181 Encounter for therapeutic drug level monitoring: Secondary | ICD-10-CM | POA: Diagnosis not present

## 2018-06-13 DIAGNOSIS — R278 Other lack of coordination: Secondary | ICD-10-CM | POA: Diagnosis not present

## 2018-06-14 DIAGNOSIS — R2689 Other abnormalities of gait and mobility: Secondary | ICD-10-CM | POA: Diagnosis not present

## 2018-06-14 DIAGNOSIS — R293 Abnormal posture: Secondary | ICD-10-CM | POA: Diagnosis not present

## 2018-06-14 DIAGNOSIS — R278 Other lack of coordination: Secondary | ICD-10-CM | POA: Diagnosis not present

## 2018-06-14 DIAGNOSIS — J1089 Influenza due to other identified influenza virus with other manifestations: Secondary | ICD-10-CM | POA: Diagnosis not present

## 2018-06-14 DIAGNOSIS — Z5181 Encounter for therapeutic drug level monitoring: Secondary | ICD-10-CM | POA: Diagnosis not present

## 2018-06-14 DIAGNOSIS — R296 Repeated falls: Secondary | ICD-10-CM | POA: Diagnosis not present

## 2018-06-15 DIAGNOSIS — R296 Repeated falls: Secondary | ICD-10-CM | POA: Diagnosis not present

## 2018-06-15 DIAGNOSIS — M6281 Muscle weakness (generalized): Secondary | ICD-10-CM | POA: Diagnosis not present

## 2018-06-15 DIAGNOSIS — R293 Abnormal posture: Secondary | ICD-10-CM | POA: Diagnosis not present

## 2018-06-15 DIAGNOSIS — R1312 Dysphagia, oropharyngeal phase: Secondary | ICD-10-CM | POA: Diagnosis not present

## 2018-06-15 DIAGNOSIS — Z5181 Encounter for therapeutic drug level monitoring: Secondary | ICD-10-CM | POA: Diagnosis not present

## 2018-06-15 DIAGNOSIS — R278 Other lack of coordination: Secondary | ICD-10-CM | POA: Diagnosis not present

## 2018-06-15 DIAGNOSIS — R2689 Other abnormalities of gait and mobility: Secondary | ICD-10-CM | POA: Diagnosis not present

## 2018-06-15 DIAGNOSIS — J1089 Influenza due to other identified influenza virus with other manifestations: Secondary | ICD-10-CM | POA: Diagnosis not present

## 2018-06-15 DIAGNOSIS — J45909 Unspecified asthma, uncomplicated: Secondary | ICD-10-CM | POA: Diagnosis not present

## 2018-06-15 DIAGNOSIS — I10 Essential (primary) hypertension: Secondary | ICD-10-CM | POA: Diagnosis not present

## 2018-06-16 DIAGNOSIS — R293 Abnormal posture: Secondary | ICD-10-CM | POA: Diagnosis not present

## 2018-06-16 DIAGNOSIS — R278 Other lack of coordination: Secondary | ICD-10-CM | POA: Diagnosis not present

## 2018-06-16 DIAGNOSIS — R2689 Other abnormalities of gait and mobility: Secondary | ICD-10-CM | POA: Diagnosis not present

## 2018-06-16 DIAGNOSIS — R296 Repeated falls: Secondary | ICD-10-CM | POA: Diagnosis not present

## 2018-06-16 DIAGNOSIS — J1089 Influenza due to other identified influenza virus with other manifestations: Secondary | ICD-10-CM | POA: Diagnosis not present

## 2018-06-16 DIAGNOSIS — Z5181 Encounter for therapeutic drug level monitoring: Secondary | ICD-10-CM | POA: Diagnosis not present

## 2018-06-18 DIAGNOSIS — R296 Repeated falls: Secondary | ICD-10-CM | POA: Diagnosis not present

## 2018-06-18 DIAGNOSIS — R2689 Other abnormalities of gait and mobility: Secondary | ICD-10-CM | POA: Diagnosis not present

## 2018-06-18 DIAGNOSIS — Z5181 Encounter for therapeutic drug level monitoring: Secondary | ICD-10-CM | POA: Diagnosis not present

## 2018-06-18 DIAGNOSIS — R293 Abnormal posture: Secondary | ICD-10-CM | POA: Diagnosis not present

## 2018-06-18 DIAGNOSIS — J1089 Influenza due to other identified influenza virus with other manifestations: Secondary | ICD-10-CM | POA: Diagnosis not present

## 2018-06-18 DIAGNOSIS — R278 Other lack of coordination: Secondary | ICD-10-CM | POA: Diagnosis not present

## 2018-06-19 DIAGNOSIS — Z5181 Encounter for therapeutic drug level monitoring: Secondary | ICD-10-CM | POA: Diagnosis not present

## 2018-06-19 DIAGNOSIS — R296 Repeated falls: Secondary | ICD-10-CM | POA: Diagnosis not present

## 2018-06-19 DIAGNOSIS — R293 Abnormal posture: Secondary | ICD-10-CM | POA: Diagnosis not present

## 2018-06-19 DIAGNOSIS — J1089 Influenza due to other identified influenza virus with other manifestations: Secondary | ICD-10-CM | POA: Diagnosis not present

## 2018-06-19 DIAGNOSIS — R2689 Other abnormalities of gait and mobility: Secondary | ICD-10-CM | POA: Diagnosis not present

## 2018-06-19 DIAGNOSIS — R278 Other lack of coordination: Secondary | ICD-10-CM | POA: Diagnosis not present

## 2018-06-20 DIAGNOSIS — J1089 Influenza due to other identified influenza virus with other manifestations: Secondary | ICD-10-CM | POA: Diagnosis not present

## 2018-06-20 DIAGNOSIS — F428 Other obsessive-compulsive disorder: Secondary | ICD-10-CM | POA: Diagnosis not present

## 2018-06-20 DIAGNOSIS — F330 Major depressive disorder, recurrent, mild: Secondary | ICD-10-CM | POA: Diagnosis not present

## 2018-06-20 DIAGNOSIS — R293 Abnormal posture: Secondary | ICD-10-CM | POA: Diagnosis not present

## 2018-06-20 DIAGNOSIS — Z5181 Encounter for therapeutic drug level monitoring: Secondary | ICD-10-CM | POA: Diagnosis not present

## 2018-06-20 DIAGNOSIS — R296 Repeated falls: Secondary | ICD-10-CM | POA: Diagnosis not present

## 2018-06-20 DIAGNOSIS — G301 Alzheimer's disease with late onset: Secondary | ICD-10-CM | POA: Diagnosis not present

## 2018-06-20 DIAGNOSIS — G4700 Insomnia, unspecified: Secondary | ICD-10-CM | POA: Diagnosis not present

## 2018-06-20 DIAGNOSIS — F0280 Dementia in other diseases classified elsewhere without behavioral disturbance: Secondary | ICD-10-CM | POA: Diagnosis not present

## 2018-06-20 DIAGNOSIS — R2689 Other abnormalities of gait and mobility: Secondary | ICD-10-CM | POA: Diagnosis not present

## 2018-06-20 DIAGNOSIS — R278 Other lack of coordination: Secondary | ICD-10-CM | POA: Diagnosis not present

## 2018-06-21 DIAGNOSIS — R278 Other lack of coordination: Secondary | ICD-10-CM | POA: Diagnosis not present

## 2018-06-21 DIAGNOSIS — R2689 Other abnormalities of gait and mobility: Secondary | ICD-10-CM | POA: Diagnosis not present

## 2018-06-21 DIAGNOSIS — Z5181 Encounter for therapeutic drug level monitoring: Secondary | ICD-10-CM | POA: Diagnosis not present

## 2018-06-21 DIAGNOSIS — R293 Abnormal posture: Secondary | ICD-10-CM | POA: Diagnosis not present

## 2018-06-21 DIAGNOSIS — R296 Repeated falls: Secondary | ICD-10-CM | POA: Diagnosis not present

## 2018-06-21 DIAGNOSIS — J1089 Influenza due to other identified influenza virus with other manifestations: Secondary | ICD-10-CM | POA: Diagnosis not present

## 2018-06-22 DIAGNOSIS — Z5181 Encounter for therapeutic drug level monitoring: Secondary | ICD-10-CM | POA: Diagnosis not present

## 2018-06-22 DIAGNOSIS — R278 Other lack of coordination: Secondary | ICD-10-CM | POA: Diagnosis not present

## 2018-06-22 DIAGNOSIS — R2689 Other abnormalities of gait and mobility: Secondary | ICD-10-CM | POA: Diagnosis not present

## 2018-06-22 DIAGNOSIS — R296 Repeated falls: Secondary | ICD-10-CM | POA: Diagnosis not present

## 2018-06-22 DIAGNOSIS — J1089 Influenza due to other identified influenza virus with other manifestations: Secondary | ICD-10-CM | POA: Diagnosis not present

## 2018-06-22 DIAGNOSIS — R293 Abnormal posture: Secondary | ICD-10-CM | POA: Diagnosis not present

## 2018-06-25 DIAGNOSIS — R2689 Other abnormalities of gait and mobility: Secondary | ICD-10-CM | POA: Diagnosis not present

## 2018-06-25 DIAGNOSIS — J1089 Influenza due to other identified influenza virus with other manifestations: Secondary | ICD-10-CM | POA: Diagnosis not present

## 2018-06-25 DIAGNOSIS — R293 Abnormal posture: Secondary | ICD-10-CM | POA: Diagnosis not present

## 2018-06-25 DIAGNOSIS — Z5181 Encounter for therapeutic drug level monitoring: Secondary | ICD-10-CM | POA: Diagnosis not present

## 2018-06-25 DIAGNOSIS — R296 Repeated falls: Secondary | ICD-10-CM | POA: Diagnosis not present

## 2018-06-25 DIAGNOSIS — R278 Other lack of coordination: Secondary | ICD-10-CM | POA: Diagnosis not present

## 2018-06-26 DIAGNOSIS — J1089 Influenza due to other identified influenza virus with other manifestations: Secondary | ICD-10-CM | POA: Diagnosis not present

## 2018-06-26 DIAGNOSIS — R278 Other lack of coordination: Secondary | ICD-10-CM | POA: Diagnosis not present

## 2018-06-26 DIAGNOSIS — R296 Repeated falls: Secondary | ICD-10-CM | POA: Diagnosis not present

## 2018-06-26 DIAGNOSIS — R2689 Other abnormalities of gait and mobility: Secondary | ICD-10-CM | POA: Diagnosis not present

## 2018-06-26 DIAGNOSIS — Z5181 Encounter for therapeutic drug level monitoring: Secondary | ICD-10-CM | POA: Diagnosis not present

## 2018-06-26 DIAGNOSIS — R293 Abnormal posture: Secondary | ICD-10-CM | POA: Diagnosis not present

## 2018-06-27 DIAGNOSIS — Z5181 Encounter for therapeutic drug level monitoring: Secondary | ICD-10-CM | POA: Diagnosis not present

## 2018-06-27 DIAGNOSIS — R2689 Other abnormalities of gait and mobility: Secondary | ICD-10-CM | POA: Diagnosis not present

## 2018-06-27 DIAGNOSIS — R293 Abnormal posture: Secondary | ICD-10-CM | POA: Diagnosis not present

## 2018-06-27 DIAGNOSIS — R296 Repeated falls: Secondary | ICD-10-CM | POA: Diagnosis not present

## 2018-06-27 DIAGNOSIS — J1089 Influenza due to other identified influenza virus with other manifestations: Secondary | ICD-10-CM | POA: Diagnosis not present

## 2018-06-27 DIAGNOSIS — R278 Other lack of coordination: Secondary | ICD-10-CM | POA: Diagnosis not present

## 2018-06-28 DIAGNOSIS — J1089 Influenza due to other identified influenza virus with other manifestations: Secondary | ICD-10-CM | POA: Diagnosis not present

## 2018-06-28 DIAGNOSIS — R2689 Other abnormalities of gait and mobility: Secondary | ICD-10-CM | POA: Diagnosis not present

## 2018-06-28 DIAGNOSIS — Z5181 Encounter for therapeutic drug level monitoring: Secondary | ICD-10-CM | POA: Diagnosis not present

## 2018-06-28 DIAGNOSIS — R293 Abnormal posture: Secondary | ICD-10-CM | POA: Diagnosis not present

## 2018-06-28 DIAGNOSIS — R278 Other lack of coordination: Secondary | ICD-10-CM | POA: Diagnosis not present

## 2018-06-28 DIAGNOSIS — R296 Repeated falls: Secondary | ICD-10-CM | POA: Diagnosis not present

## 2018-06-29 DIAGNOSIS — R296 Repeated falls: Secondary | ICD-10-CM | POA: Diagnosis not present

## 2018-06-29 DIAGNOSIS — R2689 Other abnormalities of gait and mobility: Secondary | ICD-10-CM | POA: Diagnosis not present

## 2018-06-29 DIAGNOSIS — Z5181 Encounter for therapeutic drug level monitoring: Secondary | ICD-10-CM | POA: Diagnosis not present

## 2018-06-29 DIAGNOSIS — J1089 Influenza due to other identified influenza virus with other manifestations: Secondary | ICD-10-CM | POA: Diagnosis not present

## 2018-06-29 DIAGNOSIS — R293 Abnormal posture: Secondary | ICD-10-CM | POA: Diagnosis not present

## 2018-06-29 DIAGNOSIS — R278 Other lack of coordination: Secondary | ICD-10-CM | POA: Diagnosis not present

## 2018-07-02 DIAGNOSIS — N183 Chronic kidney disease, stage 3 (moderate): Secondary | ICD-10-CM | POA: Diagnosis not present

## 2018-07-02 DIAGNOSIS — E119 Type 2 diabetes mellitus without complications: Secondary | ICD-10-CM | POA: Diagnosis not present

## 2018-07-02 DIAGNOSIS — R05 Cough: Secondary | ICD-10-CM | POA: Diagnosis not present

## 2018-07-02 DIAGNOSIS — I10 Essential (primary) hypertension: Secondary | ICD-10-CM | POA: Diagnosis not present

## 2018-07-18 DIAGNOSIS — F0280 Dementia in other diseases classified elsewhere without behavioral disturbance: Secondary | ICD-10-CM | POA: Diagnosis not present

## 2018-07-18 DIAGNOSIS — F330 Major depressive disorder, recurrent, mild: Secondary | ICD-10-CM | POA: Diagnosis not present

## 2018-07-18 DIAGNOSIS — G301 Alzheimer's disease with late onset: Secondary | ICD-10-CM | POA: Diagnosis not present

## 2018-07-18 DIAGNOSIS — G4700 Insomnia, unspecified: Secondary | ICD-10-CM | POA: Diagnosis not present

## 2018-07-18 DIAGNOSIS — F428 Other obsessive-compulsive disorder: Secondary | ICD-10-CM | POA: Diagnosis not present

## 2018-07-19 DIAGNOSIS — F419 Anxiety disorder, unspecified: Secondary | ICD-10-CM | POA: Diagnosis not present

## 2018-07-19 DIAGNOSIS — W19XXXD Unspecified fall, subsequent encounter: Secondary | ICD-10-CM | POA: Diagnosis not present

## 2018-07-19 DIAGNOSIS — L299 Pruritus, unspecified: Secondary | ICD-10-CM | POA: Diagnosis not present

## 2018-07-19 DIAGNOSIS — R451 Restlessness and agitation: Secondary | ICD-10-CM | POA: Diagnosis not present

## 2018-07-25 DIAGNOSIS — I10 Essential (primary) hypertension: Secondary | ICD-10-CM | POA: Diagnosis not present

## 2018-07-25 DIAGNOSIS — I4891 Unspecified atrial fibrillation: Secondary | ICD-10-CM | POA: Diagnosis not present

## 2018-07-25 DIAGNOSIS — I509 Heart failure, unspecified: Secondary | ICD-10-CM | POA: Diagnosis not present

## 2018-07-25 DIAGNOSIS — F428 Other obsessive-compulsive disorder: Secondary | ICD-10-CM | POA: Diagnosis not present

## 2018-07-25 DIAGNOSIS — F0280 Dementia in other diseases classified elsewhere without behavioral disturbance: Secondary | ICD-10-CM | POA: Diagnosis not present

## 2018-07-25 DIAGNOSIS — E119 Type 2 diabetes mellitus without complications: Secondary | ICD-10-CM | POA: Diagnosis not present

## 2018-07-25 DIAGNOSIS — G301 Alzheimer's disease with late onset: Secondary | ICD-10-CM | POA: Diagnosis not present

## 2018-07-25 DIAGNOSIS — E039 Hypothyroidism, unspecified: Secondary | ICD-10-CM | POA: Diagnosis not present

## 2018-07-25 DIAGNOSIS — F0390 Unspecified dementia without behavioral disturbance: Secondary | ICD-10-CM | POA: Diagnosis not present

## 2018-07-25 DIAGNOSIS — F330 Major depressive disorder, recurrent, mild: Secondary | ICD-10-CM | POA: Diagnosis not present

## 2018-07-25 DIAGNOSIS — G4700 Insomnia, unspecified: Secondary | ICD-10-CM | POA: Diagnosis not present

## 2018-07-30 DIAGNOSIS — D649 Anemia, unspecified: Secondary | ICD-10-CM | POA: Diagnosis not present

## 2018-07-30 DIAGNOSIS — E119 Type 2 diabetes mellitus without complications: Secondary | ICD-10-CM | POA: Diagnosis not present

## 2018-07-30 DIAGNOSIS — E7849 Other hyperlipidemia: Secondary | ICD-10-CM | POA: Diagnosis not present

## 2018-07-30 DIAGNOSIS — E1121 Type 2 diabetes mellitus with diabetic nephropathy: Secondary | ICD-10-CM | POA: Diagnosis not present

## 2018-08-01 DIAGNOSIS — F428 Other obsessive-compulsive disorder: Secondary | ICD-10-CM | POA: Diagnosis not present

## 2018-08-01 DIAGNOSIS — F330 Major depressive disorder, recurrent, mild: Secondary | ICD-10-CM | POA: Diagnosis not present

## 2018-08-01 DIAGNOSIS — F0280 Dementia in other diseases classified elsewhere without behavioral disturbance: Secondary | ICD-10-CM | POA: Diagnosis not present

## 2018-08-01 DIAGNOSIS — G301 Alzheimer's disease with late onset: Secondary | ICD-10-CM | POA: Diagnosis not present

## 2018-08-01 DIAGNOSIS — G4700 Insomnia, unspecified: Secondary | ICD-10-CM | POA: Diagnosis not present

## 2018-08-24 DIAGNOSIS — L603 Nail dystrophy: Secondary | ICD-10-CM | POA: Diagnosis not present

## 2018-08-24 DIAGNOSIS — I739 Peripheral vascular disease, unspecified: Secondary | ICD-10-CM | POA: Diagnosis not present

## 2018-08-24 DIAGNOSIS — Q845 Enlarged and hypertrophic nails: Secondary | ICD-10-CM | POA: Diagnosis not present

## 2018-08-24 DIAGNOSIS — E1159 Type 2 diabetes mellitus with other circulatory complications: Secondary | ICD-10-CM | POA: Diagnosis not present

## 2018-08-27 DIAGNOSIS — E119 Type 2 diabetes mellitus without complications: Secondary | ICD-10-CM | POA: Diagnosis not present

## 2018-08-27 DIAGNOSIS — I10 Essential (primary) hypertension: Secondary | ICD-10-CM | POA: Diagnosis not present

## 2018-08-27 DIAGNOSIS — F0391 Unspecified dementia with behavioral disturbance: Secondary | ICD-10-CM | POA: Diagnosis not present

## 2018-08-27 DIAGNOSIS — L03115 Cellulitis of right lower limb: Secondary | ICD-10-CM | POA: Diagnosis not present

## 2018-08-31 DIAGNOSIS — I4891 Unspecified atrial fibrillation: Secondary | ICD-10-CM | POA: Diagnosis not present

## 2018-08-31 DIAGNOSIS — I10 Essential (primary) hypertension: Secondary | ICD-10-CM | POA: Diagnosis not present

## 2018-08-31 DIAGNOSIS — E119 Type 2 diabetes mellitus without complications: Secondary | ICD-10-CM | POA: Diagnosis not present

## 2018-08-31 DIAGNOSIS — L03115 Cellulitis of right lower limb: Secondary | ICD-10-CM | POA: Diagnosis not present

## 2018-09-07 DIAGNOSIS — E119 Type 2 diabetes mellitus without complications: Secondary | ICD-10-CM | POA: Diagnosis not present

## 2018-09-07 DIAGNOSIS — F419 Anxiety disorder, unspecified: Secondary | ICD-10-CM | POA: Diagnosis not present

## 2018-09-07 DIAGNOSIS — R0981 Nasal congestion: Secondary | ICD-10-CM | POA: Diagnosis not present

## 2018-09-07 DIAGNOSIS — I10 Essential (primary) hypertension: Secondary | ICD-10-CM | POA: Diagnosis not present

## 2018-09-12 DIAGNOSIS — F330 Major depressive disorder, recurrent, mild: Secondary | ICD-10-CM | POA: Diagnosis not present

## 2018-09-12 DIAGNOSIS — G4700 Insomnia, unspecified: Secondary | ICD-10-CM | POA: Diagnosis not present

## 2018-09-12 DIAGNOSIS — F0280 Dementia in other diseases classified elsewhere without behavioral disturbance: Secondary | ICD-10-CM | POA: Diagnosis not present

## 2018-09-12 DIAGNOSIS — G301 Alzheimer's disease with late onset: Secondary | ICD-10-CM | POA: Diagnosis not present

## 2018-09-12 DIAGNOSIS — F428 Other obsessive-compulsive disorder: Secondary | ICD-10-CM | POA: Diagnosis not present

## 2018-09-14 DIAGNOSIS — I739 Peripheral vascular disease, unspecified: Secondary | ICD-10-CM | POA: Diagnosis not present

## 2018-09-14 DIAGNOSIS — F330 Major depressive disorder, recurrent, mild: Secondary | ICD-10-CM | POA: Diagnosis not present

## 2018-09-14 DIAGNOSIS — I10 Essential (primary) hypertension: Secondary | ICD-10-CM | POA: Diagnosis not present

## 2018-09-14 DIAGNOSIS — F419 Anxiety disorder, unspecified: Secondary | ICD-10-CM | POA: Diagnosis not present

## 2018-09-14 DIAGNOSIS — F0280 Dementia in other diseases classified elsewhere without behavioral disturbance: Secondary | ICD-10-CM | POA: Diagnosis not present

## 2018-09-19 DIAGNOSIS — F0391 Unspecified dementia with behavioral disturbance: Secondary | ICD-10-CM | POA: Diagnosis not present

## 2018-09-19 DIAGNOSIS — I4891 Unspecified atrial fibrillation: Secondary | ICD-10-CM | POA: Diagnosis not present

## 2018-09-19 DIAGNOSIS — I10 Essential (primary) hypertension: Secondary | ICD-10-CM | POA: Diagnosis not present

## 2018-09-19 DIAGNOSIS — F0390 Unspecified dementia without behavioral disturbance: Secondary | ICD-10-CM | POA: Diagnosis not present

## 2018-09-19 DIAGNOSIS — R451 Restlessness and agitation: Secondary | ICD-10-CM | POA: Diagnosis not present

## 2018-09-19 DIAGNOSIS — E039 Hypothyroidism, unspecified: Secondary | ICD-10-CM | POA: Diagnosis not present

## 2018-09-19 DIAGNOSIS — E119 Type 2 diabetes mellitus without complications: Secondary | ICD-10-CM | POA: Diagnosis not present

## 2018-10-02 DIAGNOSIS — J45909 Unspecified asthma, uncomplicated: Secondary | ICD-10-CM | POA: Diagnosis not present

## 2018-10-02 DIAGNOSIS — J1089 Influenza due to other identified influenza virus with other manifestations: Secondary | ICD-10-CM | POA: Diagnosis not present

## 2018-10-02 DIAGNOSIS — R293 Abnormal posture: Secondary | ICD-10-CM | POA: Diagnosis not present

## 2018-10-02 DIAGNOSIS — R1312 Dysphagia, oropharyngeal phase: Secondary | ICD-10-CM | POA: Diagnosis not present

## 2018-10-02 DIAGNOSIS — Z5181 Encounter for therapeutic drug level monitoring: Secondary | ICD-10-CM | POA: Diagnosis not present

## 2018-10-02 DIAGNOSIS — R278 Other lack of coordination: Secondary | ICD-10-CM | POA: Diagnosis not present

## 2018-10-02 DIAGNOSIS — R296 Repeated falls: Secondary | ICD-10-CM | POA: Diagnosis not present

## 2018-10-02 DIAGNOSIS — M6281 Muscle weakness (generalized): Secondary | ICD-10-CM | POA: Diagnosis not present

## 2018-10-02 DIAGNOSIS — I10 Essential (primary) hypertension: Secondary | ICD-10-CM | POA: Diagnosis not present

## 2018-10-02 DIAGNOSIS — R2689 Other abnormalities of gait and mobility: Secondary | ICD-10-CM | POA: Diagnosis not present

## 2018-10-03 DIAGNOSIS — R296 Repeated falls: Secondary | ICD-10-CM | POA: Diagnosis not present

## 2018-10-03 DIAGNOSIS — R278 Other lack of coordination: Secondary | ICD-10-CM | POA: Diagnosis not present

## 2018-10-03 DIAGNOSIS — R2689 Other abnormalities of gait and mobility: Secondary | ICD-10-CM | POA: Diagnosis not present

## 2018-10-03 DIAGNOSIS — Z5181 Encounter for therapeutic drug level monitoring: Secondary | ICD-10-CM | POA: Diagnosis not present

## 2018-10-03 DIAGNOSIS — J1089 Influenza due to other identified influenza virus with other manifestations: Secondary | ICD-10-CM | POA: Diagnosis not present

## 2018-10-03 DIAGNOSIS — R293 Abnormal posture: Secondary | ICD-10-CM | POA: Diagnosis not present

## 2018-10-04 DIAGNOSIS — R2689 Other abnormalities of gait and mobility: Secondary | ICD-10-CM | POA: Diagnosis not present

## 2018-10-04 DIAGNOSIS — J1089 Influenza due to other identified influenza virus with other manifestations: Secondary | ICD-10-CM | POA: Diagnosis not present

## 2018-10-04 DIAGNOSIS — R296 Repeated falls: Secondary | ICD-10-CM | POA: Diagnosis not present

## 2018-10-04 DIAGNOSIS — R293 Abnormal posture: Secondary | ICD-10-CM | POA: Diagnosis not present

## 2018-10-04 DIAGNOSIS — Z5181 Encounter for therapeutic drug level monitoring: Secondary | ICD-10-CM | POA: Diagnosis not present

## 2018-10-04 DIAGNOSIS — R278 Other lack of coordination: Secondary | ICD-10-CM | POA: Diagnosis not present

## 2018-10-05 DIAGNOSIS — Z5181 Encounter for therapeutic drug level monitoring: Secondary | ICD-10-CM | POA: Diagnosis not present

## 2018-10-05 DIAGNOSIS — J1089 Influenza due to other identified influenza virus with other manifestations: Secondary | ICD-10-CM | POA: Diagnosis not present

## 2018-10-05 DIAGNOSIS — R2689 Other abnormalities of gait and mobility: Secondary | ICD-10-CM | POA: Diagnosis not present

## 2018-10-05 DIAGNOSIS — R293 Abnormal posture: Secondary | ICD-10-CM | POA: Diagnosis not present

## 2018-10-05 DIAGNOSIS — R278 Other lack of coordination: Secondary | ICD-10-CM | POA: Diagnosis not present

## 2018-10-05 DIAGNOSIS — R296 Repeated falls: Secondary | ICD-10-CM | POA: Diagnosis not present

## 2018-10-08 DIAGNOSIS — Z5181 Encounter for therapeutic drug level monitoring: Secondary | ICD-10-CM | POA: Diagnosis not present

## 2018-10-08 DIAGNOSIS — R293 Abnormal posture: Secondary | ICD-10-CM | POA: Diagnosis not present

## 2018-10-08 DIAGNOSIS — R296 Repeated falls: Secondary | ICD-10-CM | POA: Diagnosis not present

## 2018-10-08 DIAGNOSIS — R2689 Other abnormalities of gait and mobility: Secondary | ICD-10-CM | POA: Diagnosis not present

## 2018-10-08 DIAGNOSIS — J1089 Influenza due to other identified influenza virus with other manifestations: Secondary | ICD-10-CM | POA: Diagnosis not present

## 2018-10-08 DIAGNOSIS — R278 Other lack of coordination: Secondary | ICD-10-CM | POA: Diagnosis not present

## 2018-10-09 DIAGNOSIS — R293 Abnormal posture: Secondary | ICD-10-CM | POA: Diagnosis not present

## 2018-10-09 DIAGNOSIS — R296 Repeated falls: Secondary | ICD-10-CM | POA: Diagnosis not present

## 2018-10-09 DIAGNOSIS — R2689 Other abnormalities of gait and mobility: Secondary | ICD-10-CM | POA: Diagnosis not present

## 2018-10-09 DIAGNOSIS — J1089 Influenza due to other identified influenza virus with other manifestations: Secondary | ICD-10-CM | POA: Diagnosis not present

## 2018-10-09 DIAGNOSIS — R278 Other lack of coordination: Secondary | ICD-10-CM | POA: Diagnosis not present

## 2018-10-09 DIAGNOSIS — Z5181 Encounter for therapeutic drug level monitoring: Secondary | ICD-10-CM | POA: Diagnosis not present

## 2018-10-10 DIAGNOSIS — R293 Abnormal posture: Secondary | ICD-10-CM | POA: Diagnosis not present

## 2018-10-10 DIAGNOSIS — R2689 Other abnormalities of gait and mobility: Secondary | ICD-10-CM | POA: Diagnosis not present

## 2018-10-10 DIAGNOSIS — G4700 Insomnia, unspecified: Secondary | ICD-10-CM | POA: Diagnosis not present

## 2018-10-10 DIAGNOSIS — Z5181 Encounter for therapeutic drug level monitoring: Secondary | ICD-10-CM | POA: Diagnosis not present

## 2018-10-10 DIAGNOSIS — F0280 Dementia in other diseases classified elsewhere without behavioral disturbance: Secondary | ICD-10-CM | POA: Diagnosis not present

## 2018-10-10 DIAGNOSIS — F330 Major depressive disorder, recurrent, mild: Secondary | ICD-10-CM | POA: Diagnosis not present

## 2018-10-10 DIAGNOSIS — R296 Repeated falls: Secondary | ICD-10-CM | POA: Diagnosis not present

## 2018-10-10 DIAGNOSIS — F428 Other obsessive-compulsive disorder: Secondary | ICD-10-CM | POA: Diagnosis not present

## 2018-10-10 DIAGNOSIS — R278 Other lack of coordination: Secondary | ICD-10-CM | POA: Diagnosis not present

## 2018-10-10 DIAGNOSIS — J1089 Influenza due to other identified influenza virus with other manifestations: Secondary | ICD-10-CM | POA: Diagnosis not present

## 2018-10-10 DIAGNOSIS — G301 Alzheimer's disease with late onset: Secondary | ICD-10-CM | POA: Diagnosis not present

## 2018-10-11 DIAGNOSIS — Z5181 Encounter for therapeutic drug level monitoring: Secondary | ICD-10-CM | POA: Diagnosis not present

## 2018-10-11 DIAGNOSIS — R278 Other lack of coordination: Secondary | ICD-10-CM | POA: Diagnosis not present

## 2018-10-11 DIAGNOSIS — R293 Abnormal posture: Secondary | ICD-10-CM | POA: Diagnosis not present

## 2018-10-11 DIAGNOSIS — R296 Repeated falls: Secondary | ICD-10-CM | POA: Diagnosis not present

## 2018-10-11 DIAGNOSIS — R2689 Other abnormalities of gait and mobility: Secondary | ICD-10-CM | POA: Diagnosis not present

## 2018-10-11 DIAGNOSIS — J1089 Influenza due to other identified influenza virus with other manifestations: Secondary | ICD-10-CM | POA: Diagnosis not present

## 2018-10-13 DIAGNOSIS — R296 Repeated falls: Secondary | ICD-10-CM | POA: Diagnosis not present

## 2018-10-13 DIAGNOSIS — R278 Other lack of coordination: Secondary | ICD-10-CM | POA: Diagnosis not present

## 2018-10-13 DIAGNOSIS — Z5181 Encounter for therapeutic drug level monitoring: Secondary | ICD-10-CM | POA: Diagnosis not present

## 2018-10-13 DIAGNOSIS — R2689 Other abnormalities of gait and mobility: Secondary | ICD-10-CM | POA: Diagnosis not present

## 2018-10-13 DIAGNOSIS — J1089 Influenza due to other identified influenza virus with other manifestations: Secondary | ICD-10-CM | POA: Diagnosis not present

## 2018-10-13 DIAGNOSIS — R293 Abnormal posture: Secondary | ICD-10-CM | POA: Diagnosis not present

## 2018-10-14 DIAGNOSIS — E039 Hypothyroidism, unspecified: Secondary | ICD-10-CM | POA: Diagnosis not present

## 2018-10-14 DIAGNOSIS — I4891 Unspecified atrial fibrillation: Secondary | ICD-10-CM | POA: Diagnosis not present

## 2018-10-14 DIAGNOSIS — E119 Type 2 diabetes mellitus without complications: Secondary | ICD-10-CM | POA: Diagnosis not present

## 2018-10-14 DIAGNOSIS — I10 Essential (primary) hypertension: Secondary | ICD-10-CM | POA: Diagnosis not present

## 2018-10-15 DIAGNOSIS — Z5181 Encounter for therapeutic drug level monitoring: Secondary | ICD-10-CM | POA: Diagnosis not present

## 2018-10-15 DIAGNOSIS — R41841 Cognitive communication deficit: Secondary | ICD-10-CM | POA: Diagnosis not present

## 2018-10-15 DIAGNOSIS — R278 Other lack of coordination: Secondary | ICD-10-CM | POA: Diagnosis not present

## 2018-10-15 DIAGNOSIS — R293 Abnormal posture: Secondary | ICD-10-CM | POA: Diagnosis not present

## 2018-10-15 DIAGNOSIS — R296 Repeated falls: Secondary | ICD-10-CM | POA: Diagnosis not present

## 2018-10-15 DIAGNOSIS — J1089 Influenza due to other identified influenza virus with other manifestations: Secondary | ICD-10-CM | POA: Diagnosis not present

## 2018-10-15 DIAGNOSIS — I10 Essential (primary) hypertension: Secondary | ICD-10-CM | POA: Diagnosis not present

## 2018-10-15 DIAGNOSIS — J45909 Unspecified asthma, uncomplicated: Secondary | ICD-10-CM | POA: Diagnosis not present

## 2018-10-15 DIAGNOSIS — M6281 Muscle weakness (generalized): Secondary | ICD-10-CM | POA: Diagnosis not present

## 2018-10-15 DIAGNOSIS — R1312 Dysphagia, oropharyngeal phase: Secondary | ICD-10-CM | POA: Diagnosis not present

## 2018-10-16 DIAGNOSIS — R278 Other lack of coordination: Secondary | ICD-10-CM | POA: Diagnosis not present

## 2018-10-16 DIAGNOSIS — R293 Abnormal posture: Secondary | ICD-10-CM | POA: Diagnosis not present

## 2018-10-16 DIAGNOSIS — R41841 Cognitive communication deficit: Secondary | ICD-10-CM | POA: Diagnosis not present

## 2018-10-16 DIAGNOSIS — Z5181 Encounter for therapeutic drug level monitoring: Secondary | ICD-10-CM | POA: Diagnosis not present

## 2018-10-16 DIAGNOSIS — J1089 Influenza due to other identified influenza virus with other manifestations: Secondary | ICD-10-CM | POA: Diagnosis not present

## 2018-10-16 DIAGNOSIS — R296 Repeated falls: Secondary | ICD-10-CM | POA: Diagnosis not present

## 2018-10-17 DIAGNOSIS — R278 Other lack of coordination: Secondary | ICD-10-CM | POA: Diagnosis not present

## 2018-10-17 DIAGNOSIS — R296 Repeated falls: Secondary | ICD-10-CM | POA: Diagnosis not present

## 2018-10-17 DIAGNOSIS — R41841 Cognitive communication deficit: Secondary | ICD-10-CM | POA: Diagnosis not present

## 2018-10-17 DIAGNOSIS — R293 Abnormal posture: Secondary | ICD-10-CM | POA: Diagnosis not present

## 2018-10-17 DIAGNOSIS — J1089 Influenza due to other identified influenza virus with other manifestations: Secondary | ICD-10-CM | POA: Diagnosis not present

## 2018-10-17 DIAGNOSIS — Z5181 Encounter for therapeutic drug level monitoring: Secondary | ICD-10-CM | POA: Diagnosis not present

## 2018-10-20 DIAGNOSIS — R293 Abnormal posture: Secondary | ICD-10-CM | POA: Diagnosis not present

## 2018-10-20 DIAGNOSIS — R278 Other lack of coordination: Secondary | ICD-10-CM | POA: Diagnosis not present

## 2018-10-20 DIAGNOSIS — Z5181 Encounter for therapeutic drug level monitoring: Secondary | ICD-10-CM | POA: Diagnosis not present

## 2018-10-20 DIAGNOSIS — R296 Repeated falls: Secondary | ICD-10-CM | POA: Diagnosis not present

## 2018-10-20 DIAGNOSIS — J1089 Influenza due to other identified influenza virus with other manifestations: Secondary | ICD-10-CM | POA: Diagnosis not present

## 2018-10-20 DIAGNOSIS — R41841 Cognitive communication deficit: Secondary | ICD-10-CM | POA: Diagnosis not present

## 2018-10-21 DIAGNOSIS — J1089 Influenza due to other identified influenza virus with other manifestations: Secondary | ICD-10-CM | POA: Diagnosis not present

## 2018-10-21 DIAGNOSIS — R293 Abnormal posture: Secondary | ICD-10-CM | POA: Diagnosis not present

## 2018-10-21 DIAGNOSIS — R278 Other lack of coordination: Secondary | ICD-10-CM | POA: Diagnosis not present

## 2018-10-21 DIAGNOSIS — Z5181 Encounter for therapeutic drug level monitoring: Secondary | ICD-10-CM | POA: Diagnosis not present

## 2018-10-21 DIAGNOSIS — R296 Repeated falls: Secondary | ICD-10-CM | POA: Diagnosis not present

## 2018-10-21 DIAGNOSIS — R41841 Cognitive communication deficit: Secondary | ICD-10-CM | POA: Diagnosis not present

## 2018-10-22 DIAGNOSIS — R278 Other lack of coordination: Secondary | ICD-10-CM | POA: Diagnosis not present

## 2018-10-22 DIAGNOSIS — J1089 Influenza due to other identified influenza virus with other manifestations: Secondary | ICD-10-CM | POA: Diagnosis not present

## 2018-10-22 DIAGNOSIS — R296 Repeated falls: Secondary | ICD-10-CM | POA: Diagnosis not present

## 2018-10-22 DIAGNOSIS — R293 Abnormal posture: Secondary | ICD-10-CM | POA: Diagnosis not present

## 2018-10-22 DIAGNOSIS — Z5181 Encounter for therapeutic drug level monitoring: Secondary | ICD-10-CM | POA: Diagnosis not present

## 2018-10-22 DIAGNOSIS — R41841 Cognitive communication deficit: Secondary | ICD-10-CM | POA: Diagnosis not present

## 2018-10-23 DIAGNOSIS — R41841 Cognitive communication deficit: Secondary | ICD-10-CM | POA: Diagnosis not present

## 2018-10-23 DIAGNOSIS — R278 Other lack of coordination: Secondary | ICD-10-CM | POA: Diagnosis not present

## 2018-10-23 DIAGNOSIS — R296 Repeated falls: Secondary | ICD-10-CM | POA: Diagnosis not present

## 2018-10-23 DIAGNOSIS — R293 Abnormal posture: Secondary | ICD-10-CM | POA: Diagnosis not present

## 2018-10-23 DIAGNOSIS — J1089 Influenza due to other identified influenza virus with other manifestations: Secondary | ICD-10-CM | POA: Diagnosis not present

## 2018-10-23 DIAGNOSIS — Z5181 Encounter for therapeutic drug level monitoring: Secondary | ICD-10-CM | POA: Diagnosis not present

## 2018-10-24 DIAGNOSIS — F428 Other obsessive-compulsive disorder: Secondary | ICD-10-CM | POA: Diagnosis not present

## 2018-10-24 DIAGNOSIS — R278 Other lack of coordination: Secondary | ICD-10-CM | POA: Diagnosis not present

## 2018-10-24 DIAGNOSIS — R296 Repeated falls: Secondary | ICD-10-CM | POA: Diagnosis not present

## 2018-10-24 DIAGNOSIS — G301 Alzheimer's disease with late onset: Secondary | ICD-10-CM | POA: Diagnosis not present

## 2018-10-24 DIAGNOSIS — R41841 Cognitive communication deficit: Secondary | ICD-10-CM | POA: Diagnosis not present

## 2018-10-24 DIAGNOSIS — G4700 Insomnia, unspecified: Secondary | ICD-10-CM | POA: Diagnosis not present

## 2018-10-24 DIAGNOSIS — R293 Abnormal posture: Secondary | ICD-10-CM | POA: Diagnosis not present

## 2018-10-24 DIAGNOSIS — Z5181 Encounter for therapeutic drug level monitoring: Secondary | ICD-10-CM | POA: Diagnosis not present

## 2018-10-24 DIAGNOSIS — J1089 Influenza due to other identified influenza virus with other manifestations: Secondary | ICD-10-CM | POA: Diagnosis not present

## 2018-10-24 DIAGNOSIS — F0280 Dementia in other diseases classified elsewhere without behavioral disturbance: Secondary | ICD-10-CM | POA: Diagnosis not present

## 2018-10-24 DIAGNOSIS — F330 Major depressive disorder, recurrent, mild: Secondary | ICD-10-CM | POA: Diagnosis not present

## 2018-10-25 DIAGNOSIS — R293 Abnormal posture: Secondary | ICD-10-CM | POA: Diagnosis not present

## 2018-10-25 DIAGNOSIS — Z5181 Encounter for therapeutic drug level monitoring: Secondary | ICD-10-CM | POA: Diagnosis not present

## 2018-10-25 DIAGNOSIS — J1089 Influenza due to other identified influenza virus with other manifestations: Secondary | ICD-10-CM | POA: Diagnosis not present

## 2018-10-25 DIAGNOSIS — R278 Other lack of coordination: Secondary | ICD-10-CM | POA: Diagnosis not present

## 2018-10-25 DIAGNOSIS — R296 Repeated falls: Secondary | ICD-10-CM | POA: Diagnosis not present

## 2018-10-25 DIAGNOSIS — R41841 Cognitive communication deficit: Secondary | ICD-10-CM | POA: Diagnosis not present

## 2018-10-26 DIAGNOSIS — R293 Abnormal posture: Secondary | ICD-10-CM | POA: Diagnosis not present

## 2018-10-26 DIAGNOSIS — R278 Other lack of coordination: Secondary | ICD-10-CM | POA: Diagnosis not present

## 2018-10-26 DIAGNOSIS — J1089 Influenza due to other identified influenza virus with other manifestations: Secondary | ICD-10-CM | POA: Diagnosis not present

## 2018-10-26 DIAGNOSIS — R296 Repeated falls: Secondary | ICD-10-CM | POA: Diagnosis not present

## 2018-10-26 DIAGNOSIS — Z5181 Encounter for therapeutic drug level monitoring: Secondary | ICD-10-CM | POA: Diagnosis not present

## 2018-10-26 DIAGNOSIS — R41841 Cognitive communication deficit: Secondary | ICD-10-CM | POA: Diagnosis not present

## 2018-10-29 DIAGNOSIS — E119 Type 2 diabetes mellitus without complications: Secondary | ICD-10-CM | POA: Diagnosis not present

## 2018-10-29 DIAGNOSIS — I4891 Unspecified atrial fibrillation: Secondary | ICD-10-CM | POA: Diagnosis not present

## 2018-10-29 DIAGNOSIS — I10 Essential (primary) hypertension: Secondary | ICD-10-CM | POA: Diagnosis not present

## 2018-10-29 DIAGNOSIS — R05 Cough: Secondary | ICD-10-CM | POA: Diagnosis not present

## 2018-10-30 DIAGNOSIS — I6529 Occlusion and stenosis of unspecified carotid artery: Secondary | ICD-10-CM | POA: Diagnosis not present

## 2018-10-30 DIAGNOSIS — R1312 Dysphagia, oropharyngeal phase: Secondary | ICD-10-CM | POA: Diagnosis not present

## 2018-10-30 DIAGNOSIS — E119 Type 2 diabetes mellitus without complications: Secondary | ICD-10-CM | POA: Diagnosis not present

## 2018-10-30 DIAGNOSIS — R296 Repeated falls: Secondary | ICD-10-CM | POA: Diagnosis not present

## 2018-10-30 DIAGNOSIS — I10 Essential (primary) hypertension: Secondary | ICD-10-CM | POA: Diagnosis not present

## 2018-10-30 DIAGNOSIS — Z794 Long term (current) use of insulin: Secondary | ICD-10-CM | POA: Diagnosis not present

## 2018-10-30 DIAGNOSIS — Z5181 Encounter for therapeutic drug level monitoring: Secondary | ICD-10-CM | POA: Diagnosis not present

## 2018-10-30 DIAGNOSIS — J189 Pneumonia, unspecified organism: Secondary | ICD-10-CM | POA: Diagnosis not present

## 2018-10-30 DIAGNOSIS — J1089 Influenza due to other identified influenza virus with other manifestations: Secondary | ICD-10-CM | POA: Diagnosis not present

## 2018-10-30 DIAGNOSIS — M6281 Muscle weakness (generalized): Secondary | ICD-10-CM | POA: Diagnosis not present

## 2018-10-30 DIAGNOSIS — I4891 Unspecified atrial fibrillation: Secondary | ICD-10-CM | POA: Diagnosis not present

## 2018-10-30 DIAGNOSIS — I509 Heart failure, unspecified: Secondary | ICD-10-CM | POA: Diagnosis not present

## 2018-10-30 DIAGNOSIS — R293 Abnormal posture: Secondary | ICD-10-CM | POA: Diagnosis not present

## 2018-10-30 DIAGNOSIS — R278 Other lack of coordination: Secondary | ICD-10-CM | POA: Diagnosis not present

## 2018-11-02 DIAGNOSIS — I509 Heart failure, unspecified: Secondary | ICD-10-CM | POA: Diagnosis not present

## 2018-11-02 DIAGNOSIS — J189 Pneumonia, unspecified organism: Secondary | ICD-10-CM | POA: Diagnosis not present

## 2018-11-02 DIAGNOSIS — I4891 Unspecified atrial fibrillation: Secondary | ICD-10-CM | POA: Diagnosis not present

## 2018-11-02 DIAGNOSIS — I10 Essential (primary) hypertension: Secondary | ICD-10-CM | POA: Diagnosis not present

## 2018-11-12 DIAGNOSIS — Z79899 Other long term (current) drug therapy: Secondary | ICD-10-CM | POA: Diagnosis not present

## 2018-11-12 DIAGNOSIS — E559 Vitamin D deficiency, unspecified: Secondary | ICD-10-CM | POA: Diagnosis not present

## 2018-11-12 DIAGNOSIS — E785 Hyperlipidemia, unspecified: Secondary | ICD-10-CM | POA: Diagnosis not present

## 2018-11-12 DIAGNOSIS — I509 Heart failure, unspecified: Secondary | ICD-10-CM | POA: Diagnosis not present

## 2018-11-12 DIAGNOSIS — E119 Type 2 diabetes mellitus without complications: Secondary | ICD-10-CM | POA: Diagnosis not present

## 2018-11-12 DIAGNOSIS — E039 Hypothyroidism, unspecified: Secondary | ICD-10-CM | POA: Diagnosis not present

## 2018-11-12 DIAGNOSIS — D649 Anemia, unspecified: Secondary | ICD-10-CM | POA: Diagnosis not present

## 2018-11-14 DIAGNOSIS — M6281 Muscle weakness (generalized): Secondary | ICD-10-CM | POA: Diagnosis not present

## 2018-11-14 DIAGNOSIS — R296 Repeated falls: Secondary | ICD-10-CM | POA: Diagnosis not present

## 2018-11-14 DIAGNOSIS — J45909 Unspecified asthma, uncomplicated: Secondary | ICD-10-CM | POA: Diagnosis not present

## 2018-11-14 DIAGNOSIS — Z5181 Encounter for therapeutic drug level monitoring: Secondary | ICD-10-CM | POA: Diagnosis not present

## 2018-11-14 DIAGNOSIS — I4891 Unspecified atrial fibrillation: Secondary | ICD-10-CM | POA: Diagnosis not present

## 2018-11-14 DIAGNOSIS — R1312 Dysphagia, oropharyngeal phase: Secondary | ICD-10-CM | POA: Diagnosis not present

## 2018-11-14 DIAGNOSIS — E1122 Type 2 diabetes mellitus with diabetic chronic kidney disease: Secondary | ICD-10-CM | POA: Diagnosis not present

## 2018-11-14 DIAGNOSIS — J1089 Influenza due to other identified influenza virus with other manifestations: Secondary | ICD-10-CM | POA: Diagnosis not present

## 2018-11-14 DIAGNOSIS — R278 Other lack of coordination: Secondary | ICD-10-CM | POA: Diagnosis not present

## 2018-11-14 DIAGNOSIS — E039 Hypothyroidism, unspecified: Secondary | ICD-10-CM | POA: Diagnosis not present

## 2018-11-14 DIAGNOSIS — F0391 Unspecified dementia with behavioral disturbance: Secondary | ICD-10-CM | POA: Diagnosis not present

## 2018-11-14 DIAGNOSIS — I10 Essential (primary) hypertension: Secondary | ICD-10-CM | POA: Diagnosis not present

## 2018-11-14 DIAGNOSIS — J189 Pneumonia, unspecified organism: Secondary | ICD-10-CM | POA: Diagnosis not present

## 2018-11-14 DIAGNOSIS — R293 Abnormal posture: Secondary | ICD-10-CM | POA: Diagnosis not present

## 2018-11-15 DIAGNOSIS — R278 Other lack of coordination: Secondary | ICD-10-CM | POA: Diagnosis not present

## 2018-11-15 DIAGNOSIS — J189 Pneumonia, unspecified organism: Secondary | ICD-10-CM | POA: Diagnosis not present

## 2018-11-15 DIAGNOSIS — Z5181 Encounter for therapeutic drug level monitoring: Secondary | ICD-10-CM | POA: Diagnosis not present

## 2018-11-15 DIAGNOSIS — R296 Repeated falls: Secondary | ICD-10-CM | POA: Diagnosis not present

## 2018-11-15 DIAGNOSIS — R293 Abnormal posture: Secondary | ICD-10-CM | POA: Diagnosis not present

## 2018-11-15 DIAGNOSIS — J1089 Influenza due to other identified influenza virus with other manifestations: Secondary | ICD-10-CM | POA: Diagnosis not present

## 2018-11-16 DIAGNOSIS — J1089 Influenza due to other identified influenza virus with other manifestations: Secondary | ICD-10-CM | POA: Diagnosis not present

## 2018-11-16 DIAGNOSIS — R293 Abnormal posture: Secondary | ICD-10-CM | POA: Diagnosis not present

## 2018-11-16 DIAGNOSIS — J189 Pneumonia, unspecified organism: Secondary | ICD-10-CM | POA: Diagnosis not present

## 2018-11-16 DIAGNOSIS — R296 Repeated falls: Secondary | ICD-10-CM | POA: Diagnosis not present

## 2018-11-16 DIAGNOSIS — R278 Other lack of coordination: Secondary | ICD-10-CM | POA: Diagnosis not present

## 2018-11-16 DIAGNOSIS — Z5181 Encounter for therapeutic drug level monitoring: Secondary | ICD-10-CM | POA: Diagnosis not present

## 2018-11-19 DIAGNOSIS — R278 Other lack of coordination: Secondary | ICD-10-CM | POA: Diagnosis not present

## 2018-11-19 DIAGNOSIS — R296 Repeated falls: Secondary | ICD-10-CM | POA: Diagnosis not present

## 2018-11-19 DIAGNOSIS — J189 Pneumonia, unspecified organism: Secondary | ICD-10-CM | POA: Diagnosis not present

## 2018-11-19 DIAGNOSIS — R293 Abnormal posture: Secondary | ICD-10-CM | POA: Diagnosis not present

## 2018-11-19 DIAGNOSIS — J1089 Influenza due to other identified influenza virus with other manifestations: Secondary | ICD-10-CM | POA: Diagnosis not present

## 2018-11-19 DIAGNOSIS — Z5181 Encounter for therapeutic drug level monitoring: Secondary | ICD-10-CM | POA: Diagnosis not present

## 2018-11-20 DIAGNOSIS — Z5181 Encounter for therapeutic drug level monitoring: Secondary | ICD-10-CM | POA: Diagnosis not present

## 2018-11-20 DIAGNOSIS — R278 Other lack of coordination: Secondary | ICD-10-CM | POA: Diagnosis not present

## 2018-11-20 DIAGNOSIS — J189 Pneumonia, unspecified organism: Secondary | ICD-10-CM | POA: Diagnosis not present

## 2018-11-20 DIAGNOSIS — R296 Repeated falls: Secondary | ICD-10-CM | POA: Diagnosis not present

## 2018-11-20 DIAGNOSIS — R293 Abnormal posture: Secondary | ICD-10-CM | POA: Diagnosis not present

## 2018-11-20 DIAGNOSIS — J1089 Influenza due to other identified influenza virus with other manifestations: Secondary | ICD-10-CM | POA: Diagnosis not present

## 2018-11-21 DIAGNOSIS — J189 Pneumonia, unspecified organism: Secondary | ICD-10-CM | POA: Diagnosis not present

## 2018-11-21 DIAGNOSIS — F330 Major depressive disorder, recurrent, mild: Secondary | ICD-10-CM | POA: Diagnosis not present

## 2018-11-21 DIAGNOSIS — Z5181 Encounter for therapeutic drug level monitoring: Secondary | ICD-10-CM | POA: Diagnosis not present

## 2018-11-21 DIAGNOSIS — F0280 Dementia in other diseases classified elsewhere without behavioral disturbance: Secondary | ICD-10-CM | POA: Diagnosis not present

## 2018-11-21 DIAGNOSIS — G4700 Insomnia, unspecified: Secondary | ICD-10-CM | POA: Diagnosis not present

## 2018-11-21 DIAGNOSIS — G301 Alzheimer's disease with late onset: Secondary | ICD-10-CM | POA: Diagnosis not present

## 2018-11-21 DIAGNOSIS — R278 Other lack of coordination: Secondary | ICD-10-CM | POA: Diagnosis not present

## 2018-11-21 DIAGNOSIS — R296 Repeated falls: Secondary | ICD-10-CM | POA: Diagnosis not present

## 2018-11-21 DIAGNOSIS — R293 Abnormal posture: Secondary | ICD-10-CM | POA: Diagnosis not present

## 2018-11-21 DIAGNOSIS — J1089 Influenza due to other identified influenza virus with other manifestations: Secondary | ICD-10-CM | POA: Diagnosis not present

## 2018-11-21 DIAGNOSIS — F428 Other obsessive-compulsive disorder: Secondary | ICD-10-CM | POA: Diagnosis not present

## 2018-11-22 DIAGNOSIS — R278 Other lack of coordination: Secondary | ICD-10-CM | POA: Diagnosis not present

## 2018-11-22 DIAGNOSIS — Z5181 Encounter for therapeutic drug level monitoring: Secondary | ICD-10-CM | POA: Diagnosis not present

## 2018-11-22 DIAGNOSIS — J1089 Influenza due to other identified influenza virus with other manifestations: Secondary | ICD-10-CM | POA: Diagnosis not present

## 2018-11-22 DIAGNOSIS — R296 Repeated falls: Secondary | ICD-10-CM | POA: Diagnosis not present

## 2018-11-22 DIAGNOSIS — J189 Pneumonia, unspecified organism: Secondary | ICD-10-CM | POA: Diagnosis not present

## 2018-11-22 DIAGNOSIS — R293 Abnormal posture: Secondary | ICD-10-CM | POA: Diagnosis not present

## 2018-11-23 DIAGNOSIS — R296 Repeated falls: Secondary | ICD-10-CM | POA: Diagnosis not present

## 2018-11-23 DIAGNOSIS — J1089 Influenza due to other identified influenza virus with other manifestations: Secondary | ICD-10-CM | POA: Diagnosis not present

## 2018-11-23 DIAGNOSIS — R278 Other lack of coordination: Secondary | ICD-10-CM | POA: Diagnosis not present

## 2018-11-23 DIAGNOSIS — R293 Abnormal posture: Secondary | ICD-10-CM | POA: Diagnosis not present

## 2018-11-23 DIAGNOSIS — J189 Pneumonia, unspecified organism: Secondary | ICD-10-CM | POA: Diagnosis not present

## 2018-11-23 DIAGNOSIS — Z5181 Encounter for therapeutic drug level monitoring: Secondary | ICD-10-CM | POA: Diagnosis not present

## 2018-11-26 DIAGNOSIS — Z5181 Encounter for therapeutic drug level monitoring: Secondary | ICD-10-CM | POA: Diagnosis not present

## 2018-11-26 DIAGNOSIS — R296 Repeated falls: Secondary | ICD-10-CM | POA: Diagnosis not present

## 2018-11-26 DIAGNOSIS — R278 Other lack of coordination: Secondary | ICD-10-CM | POA: Diagnosis not present

## 2018-11-26 DIAGNOSIS — R293 Abnormal posture: Secondary | ICD-10-CM | POA: Diagnosis not present

## 2018-11-26 DIAGNOSIS — J189 Pneumonia, unspecified organism: Secondary | ICD-10-CM | POA: Diagnosis not present

## 2018-11-26 DIAGNOSIS — J1089 Influenza due to other identified influenza virus with other manifestations: Secondary | ICD-10-CM | POA: Diagnosis not present

## 2018-11-27 DIAGNOSIS — R293 Abnormal posture: Secondary | ICD-10-CM | POA: Diagnosis not present

## 2018-11-27 DIAGNOSIS — J1089 Influenza due to other identified influenza virus with other manifestations: Secondary | ICD-10-CM | POA: Diagnosis not present

## 2018-11-27 DIAGNOSIS — J189 Pneumonia, unspecified organism: Secondary | ICD-10-CM | POA: Diagnosis not present

## 2018-11-27 DIAGNOSIS — Z5181 Encounter for therapeutic drug level monitoring: Secondary | ICD-10-CM | POA: Diagnosis not present

## 2018-11-27 DIAGNOSIS — R278 Other lack of coordination: Secondary | ICD-10-CM | POA: Diagnosis not present

## 2018-11-27 DIAGNOSIS — R296 Repeated falls: Secondary | ICD-10-CM | POA: Diagnosis not present

## 2018-11-28 DIAGNOSIS — R293 Abnormal posture: Secondary | ICD-10-CM | POA: Diagnosis not present

## 2018-11-28 DIAGNOSIS — G301 Alzheimer's disease with late onset: Secondary | ICD-10-CM | POA: Diagnosis not present

## 2018-11-28 DIAGNOSIS — J1089 Influenza due to other identified influenza virus with other manifestations: Secondary | ICD-10-CM | POA: Diagnosis not present

## 2018-11-28 DIAGNOSIS — J189 Pneumonia, unspecified organism: Secondary | ICD-10-CM | POA: Diagnosis not present

## 2018-11-28 DIAGNOSIS — R278 Other lack of coordination: Secondary | ICD-10-CM | POA: Diagnosis not present

## 2018-11-28 DIAGNOSIS — F428 Other obsessive-compulsive disorder: Secondary | ICD-10-CM | POA: Diagnosis not present

## 2018-11-28 DIAGNOSIS — R296 Repeated falls: Secondary | ICD-10-CM | POA: Diagnosis not present

## 2018-11-28 DIAGNOSIS — F0280 Dementia in other diseases classified elsewhere without behavioral disturbance: Secondary | ICD-10-CM | POA: Diagnosis not present

## 2018-11-28 DIAGNOSIS — Z5181 Encounter for therapeutic drug level monitoring: Secondary | ICD-10-CM | POA: Diagnosis not present

## 2018-11-28 DIAGNOSIS — F330 Major depressive disorder, recurrent, mild: Secondary | ICD-10-CM | POA: Diagnosis not present

## 2018-11-28 DIAGNOSIS — G4700 Insomnia, unspecified: Secondary | ICD-10-CM | POA: Diagnosis not present

## 2018-11-30 DIAGNOSIS — R278 Other lack of coordination: Secondary | ICD-10-CM | POA: Diagnosis not present

## 2018-11-30 DIAGNOSIS — J1089 Influenza due to other identified influenza virus with other manifestations: Secondary | ICD-10-CM | POA: Diagnosis not present

## 2018-11-30 DIAGNOSIS — J189 Pneumonia, unspecified organism: Secondary | ICD-10-CM | POA: Diagnosis not present

## 2018-11-30 DIAGNOSIS — Z5181 Encounter for therapeutic drug level monitoring: Secondary | ICD-10-CM | POA: Diagnosis not present

## 2018-11-30 DIAGNOSIS — R293 Abnormal posture: Secondary | ICD-10-CM | POA: Diagnosis not present

## 2018-11-30 DIAGNOSIS — R296 Repeated falls: Secondary | ICD-10-CM | POA: Diagnosis not present

## 2018-12-03 DIAGNOSIS — R278 Other lack of coordination: Secondary | ICD-10-CM | POA: Diagnosis not present

## 2018-12-03 DIAGNOSIS — J1089 Influenza due to other identified influenza virus with other manifestations: Secondary | ICD-10-CM | POA: Diagnosis not present

## 2018-12-03 DIAGNOSIS — R293 Abnormal posture: Secondary | ICD-10-CM | POA: Diagnosis not present

## 2018-12-03 DIAGNOSIS — J189 Pneumonia, unspecified organism: Secondary | ICD-10-CM | POA: Diagnosis not present

## 2018-12-03 DIAGNOSIS — R296 Repeated falls: Secondary | ICD-10-CM | POA: Diagnosis not present

## 2018-12-03 DIAGNOSIS — Z5181 Encounter for therapeutic drug level monitoring: Secondary | ICD-10-CM | POA: Diagnosis not present

## 2018-12-04 DIAGNOSIS — R293 Abnormal posture: Secondary | ICD-10-CM | POA: Diagnosis not present

## 2018-12-04 DIAGNOSIS — J189 Pneumonia, unspecified organism: Secondary | ICD-10-CM | POA: Diagnosis not present

## 2018-12-04 DIAGNOSIS — R278 Other lack of coordination: Secondary | ICD-10-CM | POA: Diagnosis not present

## 2018-12-04 DIAGNOSIS — J1089 Influenza due to other identified influenza virus with other manifestations: Secondary | ICD-10-CM | POA: Diagnosis not present

## 2018-12-04 DIAGNOSIS — Z5181 Encounter for therapeutic drug level monitoring: Secondary | ICD-10-CM | POA: Diagnosis not present

## 2018-12-04 DIAGNOSIS — R296 Repeated falls: Secondary | ICD-10-CM | POA: Diagnosis not present

## 2018-12-10 DIAGNOSIS — R6889 Other general symptoms and signs: Secondary | ICD-10-CM | POA: Diagnosis not present

## 2018-12-12 DIAGNOSIS — F428 Other obsessive-compulsive disorder: Secondary | ICD-10-CM | POA: Diagnosis not present

## 2018-12-12 DIAGNOSIS — G301 Alzheimer's disease with late onset: Secondary | ICD-10-CM | POA: Diagnosis not present

## 2018-12-12 DIAGNOSIS — G4700 Insomnia, unspecified: Secondary | ICD-10-CM | POA: Diagnosis not present

## 2018-12-12 DIAGNOSIS — F0280 Dementia in other diseases classified elsewhere without behavioral disturbance: Secondary | ICD-10-CM | POA: Diagnosis not present

## 2018-12-12 DIAGNOSIS — F330 Major depressive disorder, recurrent, mild: Secondary | ICD-10-CM | POA: Diagnosis not present

## 2018-12-13 DIAGNOSIS — G894 Chronic pain syndrome: Secondary | ICD-10-CM | POA: Diagnosis not present

## 2018-12-13 DIAGNOSIS — M179 Osteoarthritis of knee, unspecified: Secondary | ICD-10-CM | POA: Diagnosis not present

## 2018-12-20 DIAGNOSIS — J45909 Unspecified asthma, uncomplicated: Secondary | ICD-10-CM | POA: Diagnosis not present

## 2018-12-20 DIAGNOSIS — Z5181 Encounter for therapeutic drug level monitoring: Secondary | ICD-10-CM | POA: Diagnosis not present

## 2018-12-20 DIAGNOSIS — R278 Other lack of coordination: Secondary | ICD-10-CM | POA: Diagnosis not present

## 2018-12-20 DIAGNOSIS — I10 Essential (primary) hypertension: Secondary | ICD-10-CM | POA: Diagnosis not present

## 2018-12-20 DIAGNOSIS — J189 Pneumonia, unspecified organism: Secondary | ICD-10-CM | POA: Diagnosis not present

## 2018-12-20 DIAGNOSIS — R296 Repeated falls: Secondary | ICD-10-CM | POA: Diagnosis not present

## 2018-12-20 DIAGNOSIS — E119 Type 2 diabetes mellitus without complications: Secondary | ICD-10-CM | POA: Diagnosis not present

## 2018-12-20 DIAGNOSIS — R1312 Dysphagia, oropharyngeal phase: Secondary | ICD-10-CM | POA: Diagnosis not present

## 2018-12-20 DIAGNOSIS — M6281 Muscle weakness (generalized): Secondary | ICD-10-CM | POA: Diagnosis not present

## 2018-12-20 DIAGNOSIS — R293 Abnormal posture: Secondary | ICD-10-CM | POA: Diagnosis not present

## 2018-12-21 DIAGNOSIS — R278 Other lack of coordination: Secondary | ICD-10-CM | POA: Diagnosis not present

## 2018-12-21 DIAGNOSIS — R293 Abnormal posture: Secondary | ICD-10-CM | POA: Diagnosis not present

## 2018-12-21 DIAGNOSIS — R296 Repeated falls: Secondary | ICD-10-CM | POA: Diagnosis not present

## 2018-12-21 DIAGNOSIS — Z5181 Encounter for therapeutic drug level monitoring: Secondary | ICD-10-CM | POA: Diagnosis not present

## 2018-12-21 DIAGNOSIS — J189 Pneumonia, unspecified organism: Secondary | ICD-10-CM | POA: Diagnosis not present

## 2018-12-21 DIAGNOSIS — E119 Type 2 diabetes mellitus without complications: Secondary | ICD-10-CM | POA: Diagnosis not present

## 2018-12-24 DIAGNOSIS — Z5181 Encounter for therapeutic drug level monitoring: Secondary | ICD-10-CM | POA: Diagnosis not present

## 2018-12-24 DIAGNOSIS — J189 Pneumonia, unspecified organism: Secondary | ICD-10-CM | POA: Diagnosis not present

## 2018-12-24 DIAGNOSIS — E119 Type 2 diabetes mellitus without complications: Secondary | ICD-10-CM | POA: Diagnosis not present

## 2018-12-24 DIAGNOSIS — R278 Other lack of coordination: Secondary | ICD-10-CM | POA: Diagnosis not present

## 2018-12-24 DIAGNOSIS — R296 Repeated falls: Secondary | ICD-10-CM | POA: Diagnosis not present

## 2018-12-24 DIAGNOSIS — R293 Abnormal posture: Secondary | ICD-10-CM | POA: Diagnosis not present

## 2018-12-25 DIAGNOSIS — R293 Abnormal posture: Secondary | ICD-10-CM | POA: Diagnosis not present

## 2018-12-25 DIAGNOSIS — J189 Pneumonia, unspecified organism: Secondary | ICD-10-CM | POA: Diagnosis not present

## 2018-12-25 DIAGNOSIS — R296 Repeated falls: Secondary | ICD-10-CM | POA: Diagnosis not present

## 2018-12-25 DIAGNOSIS — R278 Other lack of coordination: Secondary | ICD-10-CM | POA: Diagnosis not present

## 2018-12-25 DIAGNOSIS — E119 Type 2 diabetes mellitus without complications: Secondary | ICD-10-CM | POA: Diagnosis not present

## 2018-12-25 DIAGNOSIS — Z5181 Encounter for therapeutic drug level monitoring: Secondary | ICD-10-CM | POA: Diagnosis not present

## 2018-12-26 DIAGNOSIS — R296 Repeated falls: Secondary | ICD-10-CM | POA: Diagnosis not present

## 2018-12-26 DIAGNOSIS — R293 Abnormal posture: Secondary | ICD-10-CM | POA: Diagnosis not present

## 2018-12-26 DIAGNOSIS — J189 Pneumonia, unspecified organism: Secondary | ICD-10-CM | POA: Diagnosis not present

## 2018-12-26 DIAGNOSIS — R278 Other lack of coordination: Secondary | ICD-10-CM | POA: Diagnosis not present

## 2018-12-26 DIAGNOSIS — E119 Type 2 diabetes mellitus without complications: Secondary | ICD-10-CM | POA: Diagnosis not present

## 2018-12-26 DIAGNOSIS — Z5181 Encounter for therapeutic drug level monitoring: Secondary | ICD-10-CM | POA: Diagnosis not present

## 2018-12-27 DIAGNOSIS — J189 Pneumonia, unspecified organism: Secondary | ICD-10-CM | POA: Diagnosis not present

## 2018-12-27 DIAGNOSIS — R293 Abnormal posture: Secondary | ICD-10-CM | POA: Diagnosis not present

## 2018-12-27 DIAGNOSIS — R296 Repeated falls: Secondary | ICD-10-CM | POA: Diagnosis not present

## 2018-12-27 DIAGNOSIS — Z5181 Encounter for therapeutic drug level monitoring: Secondary | ICD-10-CM | POA: Diagnosis not present

## 2018-12-27 DIAGNOSIS — E119 Type 2 diabetes mellitus without complications: Secondary | ICD-10-CM | POA: Diagnosis not present

## 2018-12-27 DIAGNOSIS — R278 Other lack of coordination: Secondary | ICD-10-CM | POA: Diagnosis not present

## 2018-12-28 DIAGNOSIS — J189 Pneumonia, unspecified organism: Secondary | ICD-10-CM | POA: Diagnosis not present

## 2018-12-28 DIAGNOSIS — E119 Type 2 diabetes mellitus without complications: Secondary | ICD-10-CM | POA: Diagnosis not present

## 2018-12-28 DIAGNOSIS — R296 Repeated falls: Secondary | ICD-10-CM | POA: Diagnosis not present

## 2018-12-28 DIAGNOSIS — R278 Other lack of coordination: Secondary | ICD-10-CM | POA: Diagnosis not present

## 2018-12-28 DIAGNOSIS — Z5181 Encounter for therapeutic drug level monitoring: Secondary | ICD-10-CM | POA: Diagnosis not present

## 2018-12-28 DIAGNOSIS — R293 Abnormal posture: Secondary | ICD-10-CM | POA: Diagnosis not present

## 2018-12-31 DIAGNOSIS — Z5181 Encounter for therapeutic drug level monitoring: Secondary | ICD-10-CM | POA: Diagnosis not present

## 2018-12-31 DIAGNOSIS — R293 Abnormal posture: Secondary | ICD-10-CM | POA: Diagnosis not present

## 2018-12-31 DIAGNOSIS — J189 Pneumonia, unspecified organism: Secondary | ICD-10-CM | POA: Diagnosis not present

## 2018-12-31 DIAGNOSIS — R296 Repeated falls: Secondary | ICD-10-CM | POA: Diagnosis not present

## 2018-12-31 DIAGNOSIS — E119 Type 2 diabetes mellitus without complications: Secondary | ICD-10-CM | POA: Diagnosis not present

## 2018-12-31 DIAGNOSIS — R278 Other lack of coordination: Secondary | ICD-10-CM | POA: Diagnosis not present

## 2019-01-01 DIAGNOSIS — J189 Pneumonia, unspecified organism: Secondary | ICD-10-CM | POA: Diagnosis not present

## 2019-01-01 DIAGNOSIS — Z5181 Encounter for therapeutic drug level monitoring: Secondary | ICD-10-CM | POA: Diagnosis not present

## 2019-01-01 DIAGNOSIS — R296 Repeated falls: Secondary | ICD-10-CM | POA: Diagnosis not present

## 2019-01-01 DIAGNOSIS — E119 Type 2 diabetes mellitus without complications: Secondary | ICD-10-CM | POA: Diagnosis not present

## 2019-01-01 DIAGNOSIS — R293 Abnormal posture: Secondary | ICD-10-CM | POA: Diagnosis not present

## 2019-01-01 DIAGNOSIS — R278 Other lack of coordination: Secondary | ICD-10-CM | POA: Diagnosis not present

## 2019-01-02 DIAGNOSIS — R293 Abnormal posture: Secondary | ICD-10-CM | POA: Diagnosis not present

## 2019-01-02 DIAGNOSIS — J189 Pneumonia, unspecified organism: Secondary | ICD-10-CM | POA: Diagnosis not present

## 2019-01-02 DIAGNOSIS — R278 Other lack of coordination: Secondary | ICD-10-CM | POA: Diagnosis not present

## 2019-01-02 DIAGNOSIS — R296 Repeated falls: Secondary | ICD-10-CM | POA: Diagnosis not present

## 2019-01-02 DIAGNOSIS — E119 Type 2 diabetes mellitus without complications: Secondary | ICD-10-CM | POA: Diagnosis not present

## 2019-01-02 DIAGNOSIS — Z5181 Encounter for therapeutic drug level monitoring: Secondary | ICD-10-CM | POA: Diagnosis not present

## 2019-01-09 DIAGNOSIS — G4700 Insomnia, unspecified: Secondary | ICD-10-CM | POA: Diagnosis not present

## 2019-01-09 DIAGNOSIS — F0280 Dementia in other diseases classified elsewhere without behavioral disturbance: Secondary | ICD-10-CM | POA: Diagnosis not present

## 2019-01-09 DIAGNOSIS — F330 Major depressive disorder, recurrent, mild: Secondary | ICD-10-CM | POA: Diagnosis not present

## 2019-01-09 DIAGNOSIS — F428 Other obsessive-compulsive disorder: Secondary | ICD-10-CM | POA: Diagnosis not present

## 2019-01-09 DIAGNOSIS — G301 Alzheimer's disease with late onset: Secondary | ICD-10-CM | POA: Diagnosis not present

## 2019-01-11 DIAGNOSIS — I10 Essential (primary) hypertension: Secondary | ICD-10-CM | POA: Diagnosis not present

## 2019-01-11 DIAGNOSIS — I4891 Unspecified atrial fibrillation: Secondary | ICD-10-CM | POA: Diagnosis not present

## 2019-01-11 DIAGNOSIS — E119 Type 2 diabetes mellitus without complications: Secondary | ICD-10-CM | POA: Diagnosis not present

## 2019-01-11 DIAGNOSIS — I509 Heart failure, unspecified: Secondary | ICD-10-CM | POA: Diagnosis not present

## 2019-01-18 DIAGNOSIS — R293 Abnormal posture: Secondary | ICD-10-CM | POA: Diagnosis not present

## 2019-01-18 DIAGNOSIS — J45909 Unspecified asthma, uncomplicated: Secondary | ICD-10-CM | POA: Diagnosis not present

## 2019-01-18 DIAGNOSIS — I10 Essential (primary) hypertension: Secondary | ICD-10-CM | POA: Diagnosis not present

## 2019-01-18 DIAGNOSIS — R278 Other lack of coordination: Secondary | ICD-10-CM | POA: Diagnosis not present

## 2019-01-18 DIAGNOSIS — R21 Rash and other nonspecific skin eruption: Secondary | ICD-10-CM | POA: Diagnosis not present

## 2019-01-18 DIAGNOSIS — R296 Repeated falls: Secondary | ICD-10-CM | POA: Diagnosis not present

## 2019-01-18 DIAGNOSIS — Z5181 Encounter for therapeutic drug level monitoring: Secondary | ICD-10-CM | POA: Diagnosis not present

## 2019-01-18 DIAGNOSIS — R1312 Dysphagia, oropharyngeal phase: Secondary | ICD-10-CM | POA: Diagnosis not present

## 2019-01-18 DIAGNOSIS — E119 Type 2 diabetes mellitus without complications: Secondary | ICD-10-CM | POA: Diagnosis not present

## 2019-01-18 DIAGNOSIS — M6281 Muscle weakness (generalized): Secondary | ICD-10-CM | POA: Diagnosis not present

## 2019-01-18 DIAGNOSIS — I4891 Unspecified atrial fibrillation: Secondary | ICD-10-CM | POA: Diagnosis not present

## 2019-01-18 DIAGNOSIS — J189 Pneumonia, unspecified organism: Secondary | ICD-10-CM | POA: Diagnosis not present

## 2019-01-30 DIAGNOSIS — E119 Type 2 diabetes mellitus without complications: Secondary | ICD-10-CM | POA: Diagnosis not present

## 2019-01-30 DIAGNOSIS — F419 Anxiety disorder, unspecified: Secondary | ICD-10-CM | POA: Diagnosis not present

## 2019-01-30 DIAGNOSIS — I10 Essential (primary) hypertension: Secondary | ICD-10-CM | POA: Diagnosis not present

## 2019-01-30 DIAGNOSIS — R140 Abdominal distension (gaseous): Secondary | ICD-10-CM | POA: Diagnosis not present

## 2019-02-06 DIAGNOSIS — G4700 Insomnia, unspecified: Secondary | ICD-10-CM | POA: Diagnosis not present

## 2019-02-06 DIAGNOSIS — F428 Other obsessive-compulsive disorder: Secondary | ICD-10-CM | POA: Diagnosis not present

## 2019-02-06 DIAGNOSIS — F330 Major depressive disorder, recurrent, mild: Secondary | ICD-10-CM | POA: Diagnosis not present

## 2019-02-06 DIAGNOSIS — F0280 Dementia in other diseases classified elsewhere without behavioral disturbance: Secondary | ICD-10-CM | POA: Diagnosis not present

## 2019-02-06 DIAGNOSIS — G301 Alzheimer's disease with late onset: Secondary | ICD-10-CM | POA: Diagnosis not present

## 2019-02-08 DIAGNOSIS — R140 Abdominal distension (gaseous): Secondary | ICD-10-CM | POA: Diagnosis not present

## 2019-02-08 DIAGNOSIS — R109 Unspecified abdominal pain: Secondary | ICD-10-CM | POA: Diagnosis not present

## 2019-02-08 DIAGNOSIS — I10 Essential (primary) hypertension: Secondary | ICD-10-CM | POA: Diagnosis not present

## 2019-02-08 DIAGNOSIS — I4891 Unspecified atrial fibrillation: Secondary | ICD-10-CM | POA: Diagnosis not present

## 2019-02-08 DIAGNOSIS — E119 Type 2 diabetes mellitus without complications: Secondary | ICD-10-CM | POA: Diagnosis not present

## 2019-02-13 DIAGNOSIS — Z20828 Contact with and (suspected) exposure to other viral communicable diseases: Secondary | ICD-10-CM | POA: Diagnosis not present

## 2019-02-20 DIAGNOSIS — I10 Essential (primary) hypertension: Secondary | ICD-10-CM | POA: Diagnosis not present

## 2019-02-20 DIAGNOSIS — R278 Other lack of coordination: Secondary | ICD-10-CM | POA: Diagnosis not present

## 2019-02-20 DIAGNOSIS — M6281 Muscle weakness (generalized): Secondary | ICD-10-CM | POA: Diagnosis not present

## 2019-02-20 DIAGNOSIS — R293 Abnormal posture: Secondary | ICD-10-CM | POA: Diagnosis not present

## 2019-02-20 DIAGNOSIS — R296 Repeated falls: Secondary | ICD-10-CM | POA: Diagnosis not present

## 2019-02-20 DIAGNOSIS — Z5181 Encounter for therapeutic drug level monitoring: Secondary | ICD-10-CM | POA: Diagnosis not present

## 2019-02-20 DIAGNOSIS — J45909 Unspecified asthma, uncomplicated: Secondary | ICD-10-CM | POA: Diagnosis not present

## 2019-02-20 DIAGNOSIS — J189 Pneumonia, unspecified organism: Secondary | ICD-10-CM | POA: Diagnosis not present

## 2019-02-20 DIAGNOSIS — R1312 Dysphagia, oropharyngeal phase: Secondary | ICD-10-CM | POA: Diagnosis not present

## 2019-02-20 DIAGNOSIS — Z20828 Contact with and (suspected) exposure to other viral communicable diseases: Secondary | ICD-10-CM | POA: Diagnosis not present

## 2019-02-20 DIAGNOSIS — E119 Type 2 diabetes mellitus without complications: Secondary | ICD-10-CM | POA: Diagnosis not present

## 2019-02-21 DIAGNOSIS — G301 Alzheimer's disease with late onset: Secondary | ICD-10-CM | POA: Diagnosis not present

## 2019-02-21 DIAGNOSIS — R278 Other lack of coordination: Secondary | ICD-10-CM | POA: Diagnosis not present

## 2019-02-21 DIAGNOSIS — F0280 Dementia in other diseases classified elsewhere without behavioral disturbance: Secondary | ICD-10-CM | POA: Diagnosis not present

## 2019-02-21 DIAGNOSIS — E119 Type 2 diabetes mellitus without complications: Secondary | ICD-10-CM | POA: Diagnosis not present

## 2019-02-21 DIAGNOSIS — R296 Repeated falls: Secondary | ICD-10-CM | POA: Diagnosis not present

## 2019-02-21 DIAGNOSIS — J189 Pneumonia, unspecified organism: Secondary | ICD-10-CM | POA: Diagnosis not present

## 2019-02-21 DIAGNOSIS — F330 Major depressive disorder, recurrent, mild: Secondary | ICD-10-CM | POA: Diagnosis not present

## 2019-02-21 DIAGNOSIS — R293 Abnormal posture: Secondary | ICD-10-CM | POA: Diagnosis not present

## 2019-02-21 DIAGNOSIS — Z5181 Encounter for therapeutic drug level monitoring: Secondary | ICD-10-CM | POA: Diagnosis not present

## 2019-02-21 DIAGNOSIS — F428 Other obsessive-compulsive disorder: Secondary | ICD-10-CM | POA: Diagnosis not present

## 2019-02-21 DIAGNOSIS — G4700 Insomnia, unspecified: Secondary | ICD-10-CM | POA: Diagnosis not present

## 2019-02-22 DIAGNOSIS — R293 Abnormal posture: Secondary | ICD-10-CM | POA: Diagnosis not present

## 2019-02-22 DIAGNOSIS — R296 Repeated falls: Secondary | ICD-10-CM | POA: Diagnosis not present

## 2019-02-22 DIAGNOSIS — E119 Type 2 diabetes mellitus without complications: Secondary | ICD-10-CM | POA: Diagnosis not present

## 2019-02-22 DIAGNOSIS — R278 Other lack of coordination: Secondary | ICD-10-CM | POA: Diagnosis not present

## 2019-02-22 DIAGNOSIS — Z5181 Encounter for therapeutic drug level monitoring: Secondary | ICD-10-CM | POA: Diagnosis not present

## 2019-02-22 DIAGNOSIS — J189 Pneumonia, unspecified organism: Secondary | ICD-10-CM | POA: Diagnosis not present

## 2019-02-25 DIAGNOSIS — R296 Repeated falls: Secondary | ICD-10-CM | POA: Diagnosis not present

## 2019-02-25 DIAGNOSIS — E119 Type 2 diabetes mellitus without complications: Secondary | ICD-10-CM | POA: Diagnosis not present

## 2019-02-25 DIAGNOSIS — R278 Other lack of coordination: Secondary | ICD-10-CM | POA: Diagnosis not present

## 2019-02-25 DIAGNOSIS — Z5181 Encounter for therapeutic drug level monitoring: Secondary | ICD-10-CM | POA: Diagnosis not present

## 2019-02-25 DIAGNOSIS — J189 Pneumonia, unspecified organism: Secondary | ICD-10-CM | POA: Diagnosis not present

## 2019-02-25 DIAGNOSIS — R293 Abnormal posture: Secondary | ICD-10-CM | POA: Diagnosis not present

## 2019-02-26 DIAGNOSIS — R278 Other lack of coordination: Secondary | ICD-10-CM | POA: Diagnosis not present

## 2019-02-26 DIAGNOSIS — Z5181 Encounter for therapeutic drug level monitoring: Secondary | ICD-10-CM | POA: Diagnosis not present

## 2019-02-26 DIAGNOSIS — E119 Type 2 diabetes mellitus without complications: Secondary | ICD-10-CM | POA: Diagnosis not present

## 2019-02-26 DIAGNOSIS — R296 Repeated falls: Secondary | ICD-10-CM | POA: Diagnosis not present

## 2019-02-26 DIAGNOSIS — R293 Abnormal posture: Secondary | ICD-10-CM | POA: Diagnosis not present

## 2019-02-26 DIAGNOSIS — J189 Pneumonia, unspecified organism: Secondary | ICD-10-CM | POA: Diagnosis not present

## 2019-02-27 DIAGNOSIS — R296 Repeated falls: Secondary | ICD-10-CM | POA: Diagnosis not present

## 2019-02-27 DIAGNOSIS — Z5181 Encounter for therapeutic drug level monitoring: Secondary | ICD-10-CM | POA: Diagnosis not present

## 2019-02-27 DIAGNOSIS — R293 Abnormal posture: Secondary | ICD-10-CM | POA: Diagnosis not present

## 2019-02-27 DIAGNOSIS — R278 Other lack of coordination: Secondary | ICD-10-CM | POA: Diagnosis not present

## 2019-02-27 DIAGNOSIS — E119 Type 2 diabetes mellitus without complications: Secondary | ICD-10-CM | POA: Diagnosis not present

## 2019-02-27 DIAGNOSIS — J189 Pneumonia, unspecified organism: Secondary | ICD-10-CM | POA: Diagnosis not present

## 2019-02-27 DIAGNOSIS — Z20828 Contact with and (suspected) exposure to other viral communicable diseases: Secondary | ICD-10-CM | POA: Diagnosis not present

## 2019-02-28 DIAGNOSIS — R278 Other lack of coordination: Secondary | ICD-10-CM | POA: Diagnosis not present

## 2019-02-28 DIAGNOSIS — J189 Pneumonia, unspecified organism: Secondary | ICD-10-CM | POA: Diagnosis not present

## 2019-02-28 DIAGNOSIS — E119 Type 2 diabetes mellitus without complications: Secondary | ICD-10-CM | POA: Diagnosis not present

## 2019-02-28 DIAGNOSIS — R296 Repeated falls: Secondary | ICD-10-CM | POA: Diagnosis not present

## 2019-02-28 DIAGNOSIS — E118 Type 2 diabetes mellitus with unspecified complications: Secondary | ICD-10-CM | POA: Diagnosis not present

## 2019-02-28 DIAGNOSIS — R293 Abnormal posture: Secondary | ICD-10-CM | POA: Diagnosis not present

## 2019-02-28 DIAGNOSIS — Z5181 Encounter for therapeutic drug level monitoring: Secondary | ICD-10-CM | POA: Diagnosis not present

## 2019-03-01 DIAGNOSIS — Z5181 Encounter for therapeutic drug level monitoring: Secondary | ICD-10-CM | POA: Diagnosis not present

## 2019-03-01 DIAGNOSIS — R293 Abnormal posture: Secondary | ICD-10-CM | POA: Diagnosis not present

## 2019-03-01 DIAGNOSIS — E119 Type 2 diabetes mellitus without complications: Secondary | ICD-10-CM | POA: Diagnosis not present

## 2019-03-01 DIAGNOSIS — J189 Pneumonia, unspecified organism: Secondary | ICD-10-CM | POA: Diagnosis not present

## 2019-03-01 DIAGNOSIS — R278 Other lack of coordination: Secondary | ICD-10-CM | POA: Diagnosis not present

## 2019-03-01 DIAGNOSIS — R296 Repeated falls: Secondary | ICD-10-CM | POA: Diagnosis not present

## 2019-03-04 DIAGNOSIS — I10 Essential (primary) hypertension: Secondary | ICD-10-CM | POA: Diagnosis not present

## 2019-03-04 DIAGNOSIS — R293 Abnormal posture: Secondary | ICD-10-CM | POA: Diagnosis not present

## 2019-03-04 DIAGNOSIS — R278 Other lack of coordination: Secondary | ICD-10-CM | POA: Diagnosis not present

## 2019-03-04 DIAGNOSIS — J189 Pneumonia, unspecified organism: Secondary | ICD-10-CM | POA: Diagnosis not present

## 2019-03-04 DIAGNOSIS — R296 Repeated falls: Secondary | ICD-10-CM | POA: Diagnosis not present

## 2019-03-04 DIAGNOSIS — K219 Gastro-esophageal reflux disease without esophagitis: Secondary | ICD-10-CM | POA: Diagnosis not present

## 2019-03-04 DIAGNOSIS — I4891 Unspecified atrial fibrillation: Secondary | ICD-10-CM | POA: Diagnosis not present

## 2019-03-04 DIAGNOSIS — Z5181 Encounter for therapeutic drug level monitoring: Secondary | ICD-10-CM | POA: Diagnosis not present

## 2019-03-04 DIAGNOSIS — E119 Type 2 diabetes mellitus without complications: Secondary | ICD-10-CM | POA: Diagnosis not present

## 2019-03-05 DIAGNOSIS — Z5181 Encounter for therapeutic drug level monitoring: Secondary | ICD-10-CM | POA: Diagnosis not present

## 2019-03-05 DIAGNOSIS — E119 Type 2 diabetes mellitus without complications: Secondary | ICD-10-CM | POA: Diagnosis not present

## 2019-03-05 DIAGNOSIS — R296 Repeated falls: Secondary | ICD-10-CM | POA: Diagnosis not present

## 2019-03-05 DIAGNOSIS — J189 Pneumonia, unspecified organism: Secondary | ICD-10-CM | POA: Diagnosis not present

## 2019-03-05 DIAGNOSIS — R278 Other lack of coordination: Secondary | ICD-10-CM | POA: Diagnosis not present

## 2019-03-05 DIAGNOSIS — R293 Abnormal posture: Secondary | ICD-10-CM | POA: Diagnosis not present

## 2019-03-06 DIAGNOSIS — E039 Hypothyroidism, unspecified: Secondary | ICD-10-CM | POA: Diagnosis not present

## 2019-03-06 DIAGNOSIS — Z20828 Contact with and (suspected) exposure to other viral communicable diseases: Secondary | ICD-10-CM | POA: Diagnosis not present

## 2019-03-06 DIAGNOSIS — I4891 Unspecified atrial fibrillation: Secondary | ICD-10-CM | POA: Diagnosis not present

## 2019-03-06 DIAGNOSIS — J189 Pneumonia, unspecified organism: Secondary | ICD-10-CM | POA: Diagnosis not present

## 2019-03-06 DIAGNOSIS — F0391 Unspecified dementia with behavioral disturbance: Secondary | ICD-10-CM | POA: Diagnosis not present

## 2019-03-06 DIAGNOSIS — E119 Type 2 diabetes mellitus without complications: Secondary | ICD-10-CM | POA: Diagnosis not present

## 2019-03-06 DIAGNOSIS — R293 Abnormal posture: Secondary | ICD-10-CM | POA: Diagnosis not present

## 2019-03-06 DIAGNOSIS — R278 Other lack of coordination: Secondary | ICD-10-CM | POA: Diagnosis not present

## 2019-03-06 DIAGNOSIS — R296 Repeated falls: Secondary | ICD-10-CM | POA: Diagnosis not present

## 2019-03-06 DIAGNOSIS — Z5181 Encounter for therapeutic drug level monitoring: Secondary | ICD-10-CM | POA: Diagnosis not present

## 2019-03-06 DIAGNOSIS — E1122 Type 2 diabetes mellitus with diabetic chronic kidney disease: Secondary | ICD-10-CM | POA: Diagnosis not present

## 2019-03-07 DIAGNOSIS — R278 Other lack of coordination: Secondary | ICD-10-CM | POA: Diagnosis not present

## 2019-03-07 DIAGNOSIS — R293 Abnormal posture: Secondary | ICD-10-CM | POA: Diagnosis not present

## 2019-03-07 DIAGNOSIS — Z5181 Encounter for therapeutic drug level monitoring: Secondary | ICD-10-CM | POA: Diagnosis not present

## 2019-03-07 DIAGNOSIS — E119 Type 2 diabetes mellitus without complications: Secondary | ICD-10-CM | POA: Diagnosis not present

## 2019-03-07 DIAGNOSIS — J189 Pneumonia, unspecified organism: Secondary | ICD-10-CM | POA: Diagnosis not present

## 2019-03-07 DIAGNOSIS — R296 Repeated falls: Secondary | ICD-10-CM | POA: Diagnosis not present

## 2019-03-08 DIAGNOSIS — R296 Repeated falls: Secondary | ICD-10-CM | POA: Diagnosis not present

## 2019-03-08 DIAGNOSIS — R293 Abnormal posture: Secondary | ICD-10-CM | POA: Diagnosis not present

## 2019-03-08 DIAGNOSIS — I4891 Unspecified atrial fibrillation: Secondary | ICD-10-CM | POA: Diagnosis not present

## 2019-03-08 DIAGNOSIS — N390 Urinary tract infection, site not specified: Secondary | ICD-10-CM | POA: Diagnosis not present

## 2019-03-08 DIAGNOSIS — Z79899 Other long term (current) drug therapy: Secondary | ICD-10-CM | POA: Diagnosis not present

## 2019-03-08 DIAGNOSIS — R319 Hematuria, unspecified: Secondary | ICD-10-CM | POA: Diagnosis not present

## 2019-03-08 DIAGNOSIS — R109 Unspecified abdominal pain: Secondary | ICD-10-CM | POA: Diagnosis not present

## 2019-03-08 DIAGNOSIS — E119 Type 2 diabetes mellitus without complications: Secondary | ICD-10-CM | POA: Diagnosis not present

## 2019-03-08 DIAGNOSIS — R278 Other lack of coordination: Secondary | ICD-10-CM | POA: Diagnosis not present

## 2019-03-08 DIAGNOSIS — J189 Pneumonia, unspecified organism: Secondary | ICD-10-CM | POA: Diagnosis not present

## 2019-03-08 DIAGNOSIS — I10 Essential (primary) hypertension: Secondary | ICD-10-CM | POA: Diagnosis not present

## 2019-03-08 DIAGNOSIS — Z5181 Encounter for therapeutic drug level monitoring: Secondary | ICD-10-CM | POA: Diagnosis not present

## 2019-03-08 DIAGNOSIS — R140 Abdominal distension (gaseous): Secondary | ICD-10-CM | POA: Diagnosis not present

## 2019-03-11 DIAGNOSIS — R296 Repeated falls: Secondary | ICD-10-CM | POA: Diagnosis not present

## 2019-03-11 DIAGNOSIS — R278 Other lack of coordination: Secondary | ICD-10-CM | POA: Diagnosis not present

## 2019-03-11 DIAGNOSIS — R293 Abnormal posture: Secondary | ICD-10-CM | POA: Diagnosis not present

## 2019-03-11 DIAGNOSIS — Z5181 Encounter for therapeutic drug level monitoring: Secondary | ICD-10-CM | POA: Diagnosis not present

## 2019-03-11 DIAGNOSIS — J189 Pneumonia, unspecified organism: Secondary | ICD-10-CM | POA: Diagnosis not present

## 2019-03-11 DIAGNOSIS — E119 Type 2 diabetes mellitus without complications: Secondary | ICD-10-CM | POA: Diagnosis not present

## 2019-03-12 DIAGNOSIS — E119 Type 2 diabetes mellitus without complications: Secondary | ICD-10-CM | POA: Diagnosis not present

## 2019-03-12 DIAGNOSIS — J189 Pneumonia, unspecified organism: Secondary | ICD-10-CM | POA: Diagnosis not present

## 2019-03-12 DIAGNOSIS — Z5181 Encounter for therapeutic drug level monitoring: Secondary | ICD-10-CM | POA: Diagnosis not present

## 2019-03-12 DIAGNOSIS — R293 Abnormal posture: Secondary | ICD-10-CM | POA: Diagnosis not present

## 2019-03-12 DIAGNOSIS — R278 Other lack of coordination: Secondary | ICD-10-CM | POA: Diagnosis not present

## 2019-03-12 DIAGNOSIS — R296 Repeated falls: Secondary | ICD-10-CM | POA: Diagnosis not present

## 2019-03-13 DIAGNOSIS — E119 Type 2 diabetes mellitus without complications: Secondary | ICD-10-CM | POA: Diagnosis not present

## 2019-03-13 DIAGNOSIS — Z5181 Encounter for therapeutic drug level monitoring: Secondary | ICD-10-CM | POA: Diagnosis not present

## 2019-03-13 DIAGNOSIS — J189 Pneumonia, unspecified organism: Secondary | ICD-10-CM | POA: Diagnosis not present

## 2019-03-13 DIAGNOSIS — R293 Abnormal posture: Secondary | ICD-10-CM | POA: Diagnosis not present

## 2019-03-13 DIAGNOSIS — R296 Repeated falls: Secondary | ICD-10-CM | POA: Diagnosis not present

## 2019-03-13 DIAGNOSIS — R278 Other lack of coordination: Secondary | ICD-10-CM | POA: Diagnosis not present

## 2019-03-14 DIAGNOSIS — R293 Abnormal posture: Secondary | ICD-10-CM | POA: Diagnosis not present

## 2019-03-14 DIAGNOSIS — R278 Other lack of coordination: Secondary | ICD-10-CM | POA: Diagnosis not present

## 2019-03-14 DIAGNOSIS — Z5181 Encounter for therapeutic drug level monitoring: Secondary | ICD-10-CM | POA: Diagnosis not present

## 2019-03-14 DIAGNOSIS — E119 Type 2 diabetes mellitus without complications: Secondary | ICD-10-CM | POA: Diagnosis not present

## 2019-03-14 DIAGNOSIS — J189 Pneumonia, unspecified organism: Secondary | ICD-10-CM | POA: Diagnosis not present

## 2019-03-14 DIAGNOSIS — R296 Repeated falls: Secondary | ICD-10-CM | POA: Diagnosis not present

## 2019-03-15 DIAGNOSIS — H401123 Primary open-angle glaucoma, left eye, severe stage: Secondary | ICD-10-CM | POA: Diagnosis not present

## 2019-03-15 DIAGNOSIS — H401114 Primary open-angle glaucoma, right eye, indeterminate stage: Secondary | ICD-10-CM | POA: Diagnosis not present

## 2019-03-15 DIAGNOSIS — R278 Other lack of coordination: Secondary | ICD-10-CM | POA: Diagnosis not present

## 2019-03-15 DIAGNOSIS — R293 Abnormal posture: Secondary | ICD-10-CM | POA: Diagnosis not present

## 2019-03-15 DIAGNOSIS — Z5181 Encounter for therapeutic drug level monitoring: Secondary | ICD-10-CM | POA: Diagnosis not present

## 2019-03-15 DIAGNOSIS — E119 Type 2 diabetes mellitus without complications: Secondary | ICD-10-CM | POA: Diagnosis not present

## 2019-03-15 DIAGNOSIS — J189 Pneumonia, unspecified organism: Secondary | ICD-10-CM | POA: Diagnosis not present

## 2019-03-15 DIAGNOSIS — H47012 Ischemic optic neuropathy, left eye: Secondary | ICD-10-CM | POA: Diagnosis not present

## 2019-03-15 DIAGNOSIS — E113393 Type 2 diabetes mellitus with moderate nonproliferative diabetic retinopathy without macular edema, bilateral: Secondary | ICD-10-CM | POA: Diagnosis not present

## 2019-03-15 DIAGNOSIS — R296 Repeated falls: Secondary | ICD-10-CM | POA: Diagnosis not present

## 2019-03-16 DIAGNOSIS — M6281 Muscle weakness (generalized): Secondary | ICD-10-CM | POA: Diagnosis not present

## 2019-03-16 DIAGNOSIS — R278 Other lack of coordination: Secondary | ICD-10-CM | POA: Diagnosis not present

## 2019-03-16 DIAGNOSIS — I10 Essential (primary) hypertension: Secondary | ICD-10-CM | POA: Diagnosis not present

## 2019-03-16 DIAGNOSIS — Z5181 Encounter for therapeutic drug level monitoring: Secondary | ICD-10-CM | POA: Diagnosis not present

## 2019-03-16 DIAGNOSIS — R293 Abnormal posture: Secondary | ICD-10-CM | POA: Diagnosis not present

## 2019-03-16 DIAGNOSIS — J45909 Unspecified asthma, uncomplicated: Secondary | ICD-10-CM | POA: Diagnosis not present

## 2019-03-16 DIAGNOSIS — J189 Pneumonia, unspecified organism: Secondary | ICD-10-CM | POA: Diagnosis not present

## 2019-03-16 DIAGNOSIS — R1312 Dysphagia, oropharyngeal phase: Secondary | ICD-10-CM | POA: Diagnosis not present

## 2019-03-16 DIAGNOSIS — R296 Repeated falls: Secondary | ICD-10-CM | POA: Diagnosis not present

## 2019-03-16 DIAGNOSIS — E119 Type 2 diabetes mellitus without complications: Secondary | ICD-10-CM | POA: Diagnosis not present

## 2019-03-16 DIAGNOSIS — R2689 Other abnormalities of gait and mobility: Secondary | ICD-10-CM | POA: Diagnosis not present

## 2019-03-17 DIAGNOSIS — R293 Abnormal posture: Secondary | ICD-10-CM | POA: Diagnosis not present

## 2019-03-17 DIAGNOSIS — Z5181 Encounter for therapeutic drug level monitoring: Secondary | ICD-10-CM | POA: Diagnosis not present

## 2019-03-17 DIAGNOSIS — R278 Other lack of coordination: Secondary | ICD-10-CM | POA: Diagnosis not present

## 2019-03-17 DIAGNOSIS — E119 Type 2 diabetes mellitus without complications: Secondary | ICD-10-CM | POA: Diagnosis not present

## 2019-03-17 DIAGNOSIS — R296 Repeated falls: Secondary | ICD-10-CM | POA: Diagnosis not present

## 2019-03-17 DIAGNOSIS — J189 Pneumonia, unspecified organism: Secondary | ICD-10-CM | POA: Diagnosis not present

## 2019-03-18 DIAGNOSIS — E119 Type 2 diabetes mellitus without complications: Secondary | ICD-10-CM | POA: Diagnosis not present

## 2019-03-18 DIAGNOSIS — Z5181 Encounter for therapeutic drug level monitoring: Secondary | ICD-10-CM | POA: Diagnosis not present

## 2019-03-18 DIAGNOSIS — R296 Repeated falls: Secondary | ICD-10-CM | POA: Diagnosis not present

## 2019-03-18 DIAGNOSIS — R293 Abnormal posture: Secondary | ICD-10-CM | POA: Diagnosis not present

## 2019-03-18 DIAGNOSIS — J189 Pneumonia, unspecified organism: Secondary | ICD-10-CM | POA: Diagnosis not present

## 2019-03-18 DIAGNOSIS — R278 Other lack of coordination: Secondary | ICD-10-CM | POA: Diagnosis not present

## 2019-03-19 DIAGNOSIS — R296 Repeated falls: Secondary | ICD-10-CM | POA: Diagnosis not present

## 2019-03-19 DIAGNOSIS — Z5181 Encounter for therapeutic drug level monitoring: Secondary | ICD-10-CM | POA: Diagnosis not present

## 2019-03-19 DIAGNOSIS — R278 Other lack of coordination: Secondary | ICD-10-CM | POA: Diagnosis not present

## 2019-03-19 DIAGNOSIS — E119 Type 2 diabetes mellitus without complications: Secondary | ICD-10-CM | POA: Diagnosis not present

## 2019-03-19 DIAGNOSIS — R293 Abnormal posture: Secondary | ICD-10-CM | POA: Diagnosis not present

## 2019-03-19 DIAGNOSIS — J189 Pneumonia, unspecified organism: Secondary | ICD-10-CM | POA: Diagnosis not present

## 2019-03-26 DIAGNOSIS — F605 Obsessive-compulsive personality disorder: Secondary | ICD-10-CM | POA: Diagnosis not present

## 2019-03-26 DIAGNOSIS — G301 Alzheimer's disease with late onset: Secondary | ICD-10-CM | POA: Diagnosis not present

## 2019-03-26 DIAGNOSIS — F419 Anxiety disorder, unspecified: Secondary | ICD-10-CM | POA: Diagnosis not present

## 2019-03-26 DIAGNOSIS — F331 Major depressive disorder, recurrent, moderate: Secondary | ICD-10-CM | POA: Diagnosis not present

## 2019-03-26 DIAGNOSIS — F5101 Primary insomnia: Secondary | ICD-10-CM | POA: Diagnosis not present

## 2019-03-27 IMAGING — CT CT ABD-PELV W/O CM
2 of 4 series · 16 of 46 positions shown, 18 images · non-contrast
Comparison: CT of the abdomen pelvis dated 02/28/2017

CLINICAL DATA: 85-year-old female with abdominal pain. Concern for
diverticulitis.

EXAM:
CT ABDOMEN AND PELVIS WITHOUT CONTRAST
TECHNIQUE: Multidetector CT imaging of the abdomen and pelvis was performed
following the standard protocol without IV contrast.

[Series 3: a/p w/o 5mm · axial · non-contrast · 0.87mm/px · z∈[+713,+1128]mm · 13 of 91 slices shown, 15 images]
[im 4/91  soft-tissue]
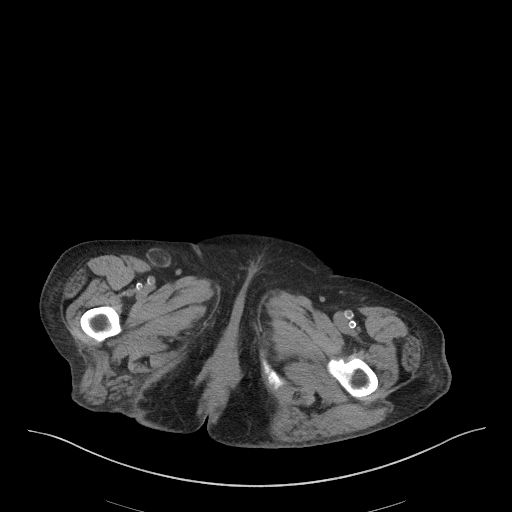
[im 4/91  bone]
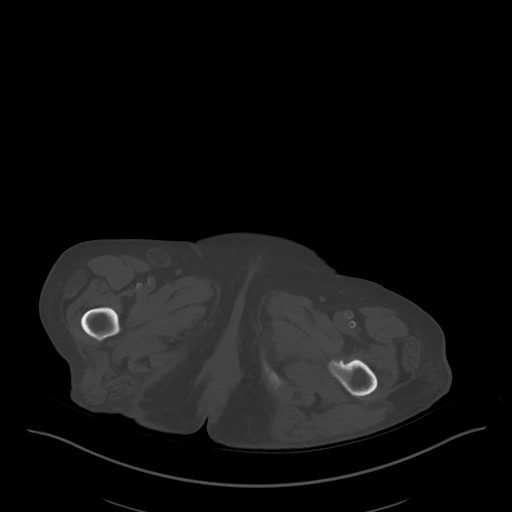
[im 11/91  soft-tissue]
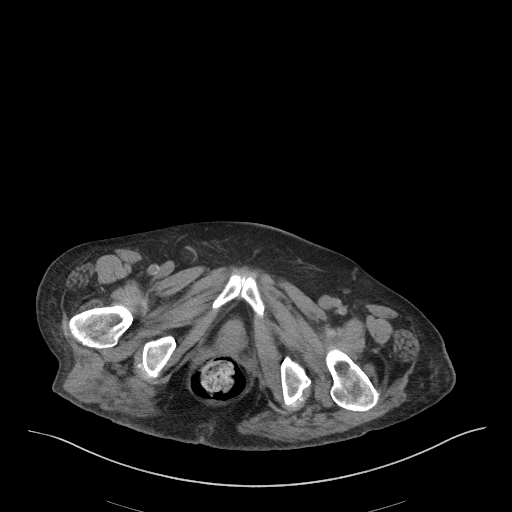
[im 19/91  soft-tissue]
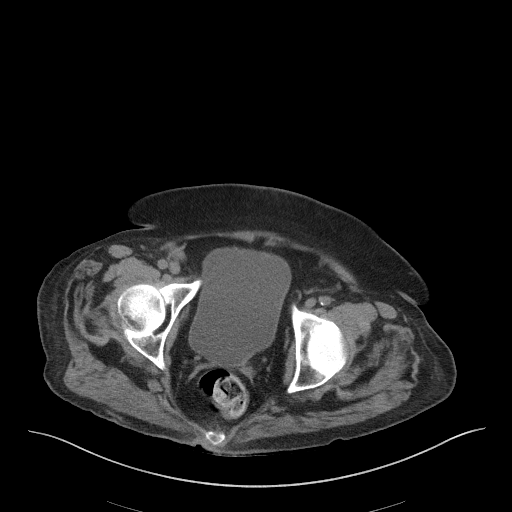
[im 26/91  soft-tissue]
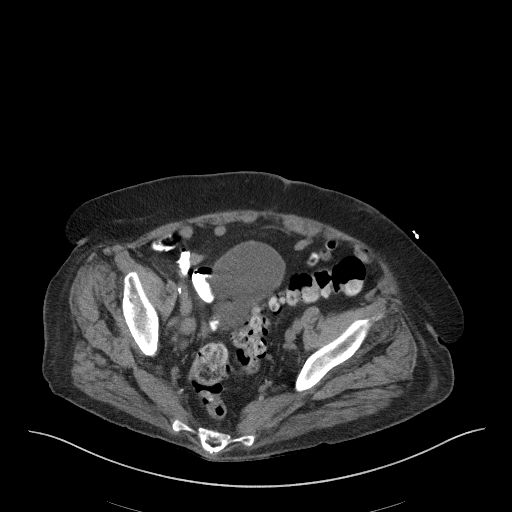
[im 33/91  soft-tissue]
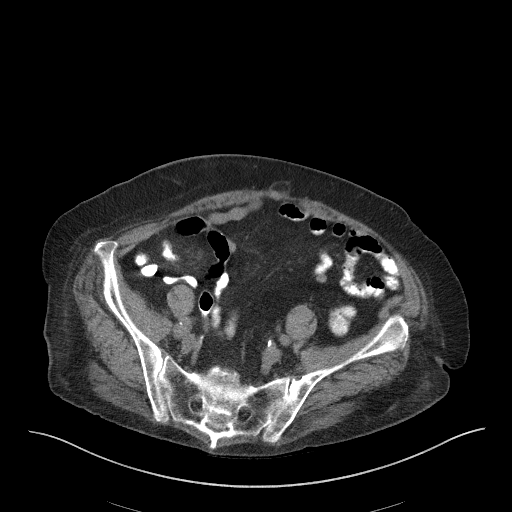
[im 40/91  soft-tissue]
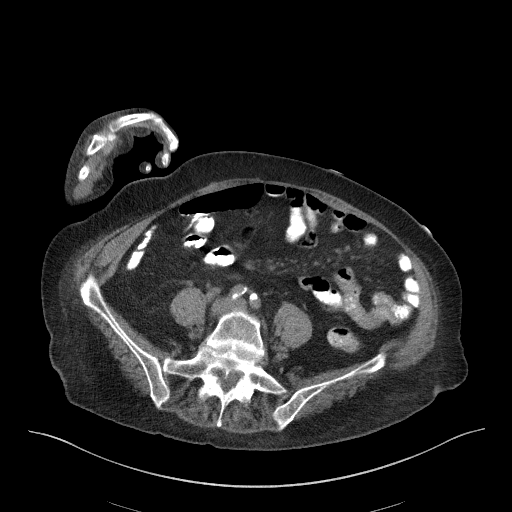
[im 47/91  soft-tissue]
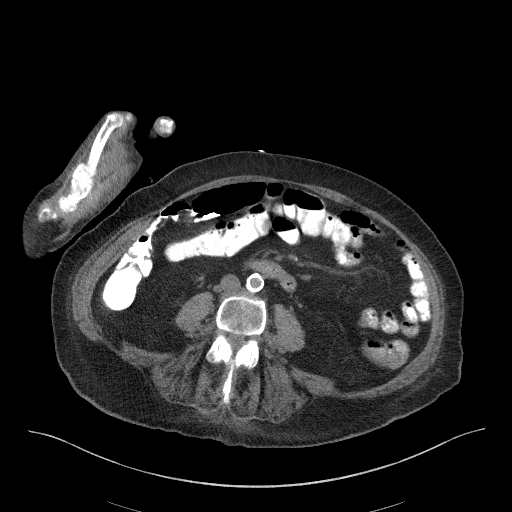
[im 51/91  soft-tissue]
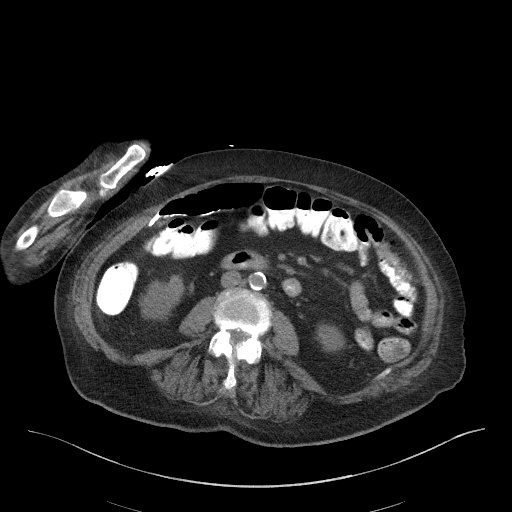
[im 58/91  soft-tissue]
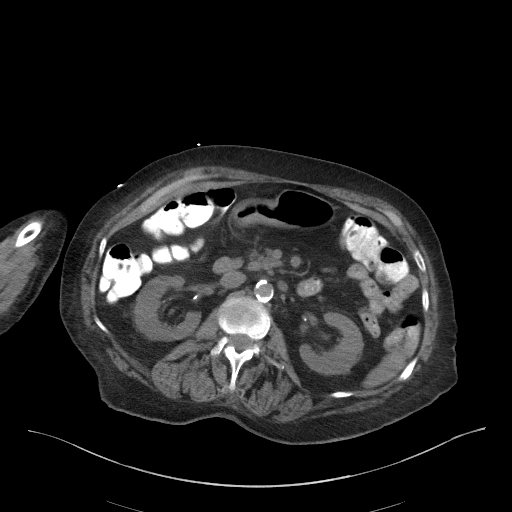
[im 58/91  bone]
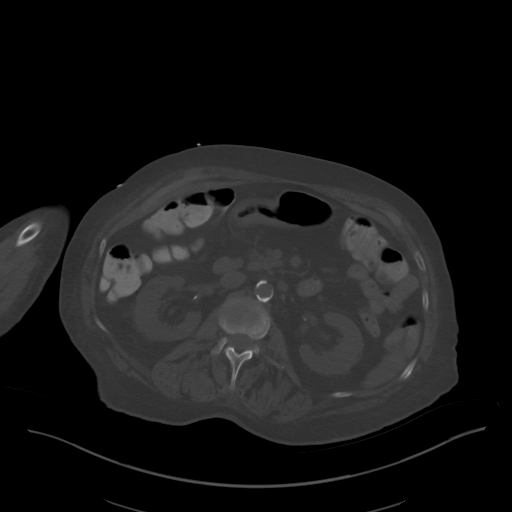
[im 65/91  soft-tissue]
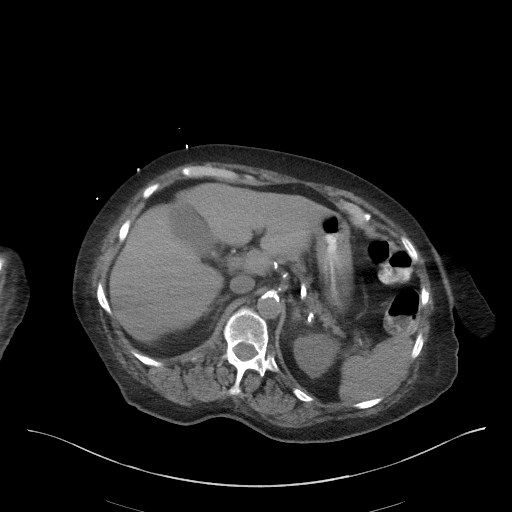
[im 73/91  soft-tissue]
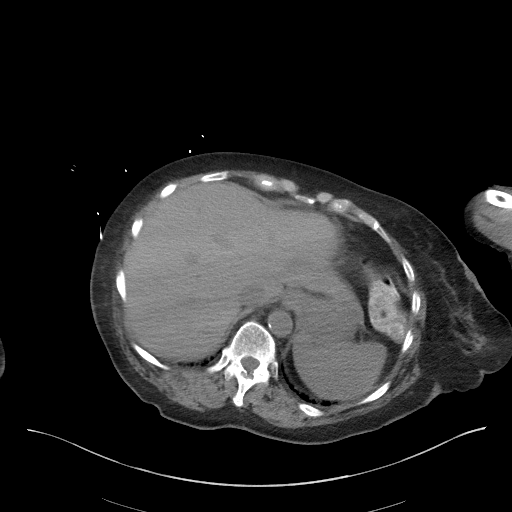
[im 80/91  soft-tissue]
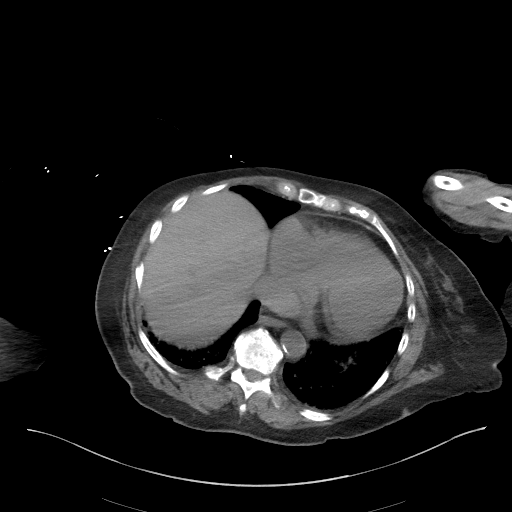
[im 87/91  soft-tissue]
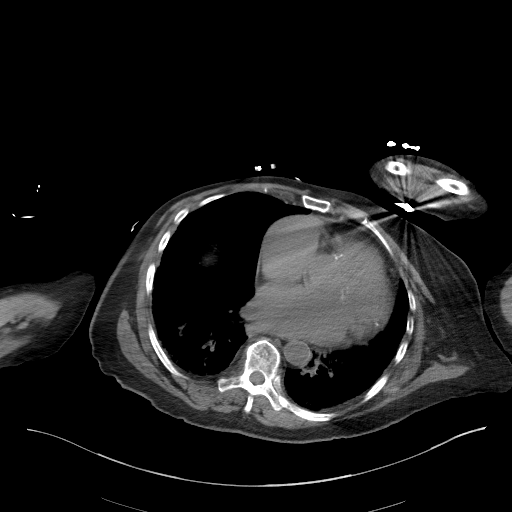

[Series 6: a/p w/o cor · coronal · non-contrast · 0.76mm/px · 3 of 139 slices shown]
[im 47/139  soft-tissue]
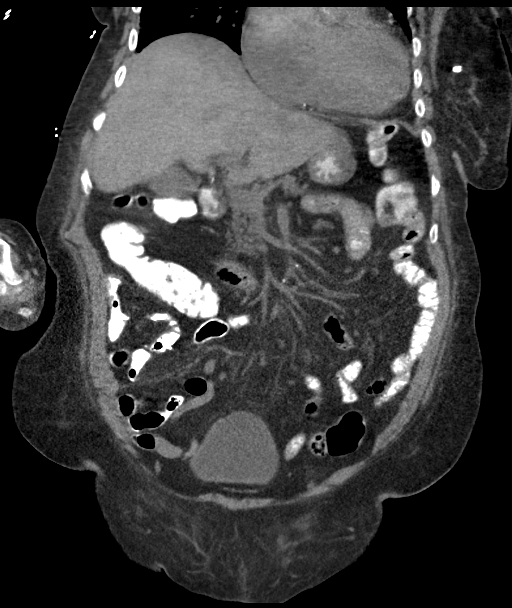
[im 62/139  soft-tissue]
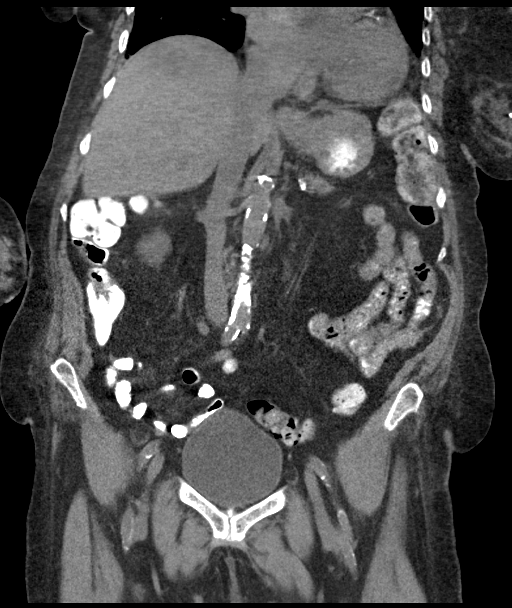
[im 77/139  soft-tissue]
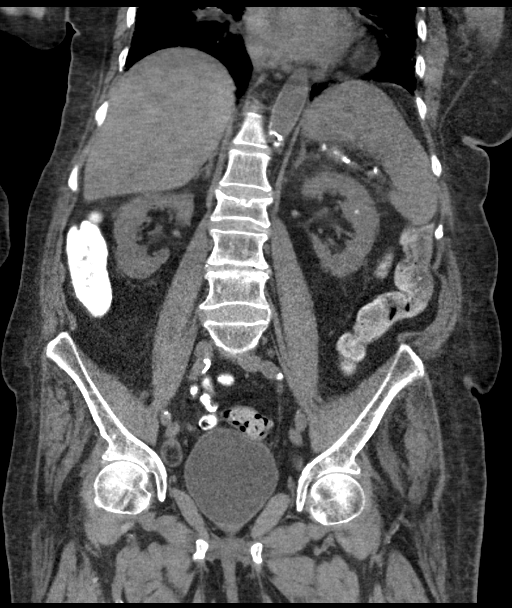

[16 of 46 positions shown; findings below may reference images not displayed]

FINDINGS: Evaluation of this exam is limited in the absence of intravenous
contrast. Evaluation is also limited due to streak artifact caused
by patient's arms.

Lower chest: There is mild cardiomegaly and coronary vascular
calcification. Minimal bibasilar atelectatic changes.

No intra-abdominal free air or free fluid

Hepatobiliary: No focal liver abnormality is seen. No gallstones,
gallbladder wall thickening, or biliary dilatation.

Pancreas: Unremarkable. No pancreatic ductal dilatation or
surrounding inflammatory changes.

Spleen: Normal in size without focal abnormality.

Adrenals/Urinary Tract: The adrenal glands are unremarkable. There
is a 3 mm nonobstructing left renal interpolar or parenchymal
calculus. No hydronephrosis. Bilateral renovascular calcifications
noted. There is no hydronephrosis or obstructing stone on the right.
The visualized ureters and urinary bladder appear unremarkable.

Stomach/Bowel: There is sigmoid diverticulosis without active
inflammatory changes. Moderate amount of dense stool noted in the
rectosigmoid. There is no bowel obstruction or active inflammation.
Normal appendix.

Vascular/Lymphatic: Advanced aortoiliac atherosclerotic disease. No
aneurysm. Evaluation of the vasculature is limited in the absence of
intravenous contrast. No portal venous gas. There is no adenopathy.

Reproductive: Hysterectomy.  No pelvic mass.

Other: None

Musculoskeletal: Osteopenia with degenerative changes of the spine
and old compression deformities of superior endplates of L2 and L4.
No acute fracture.
IMPRESSION: 1. No acute intra-abdominal or pelvic pathology. Sigmoid
diverticulosis. No bowel obstruction or active inflammation. Normal
appendix.
2.  Aortic Atherosclerosis ([id number]).

## 2019-04-04 DIAGNOSIS — R05 Cough: Secondary | ICD-10-CM | POA: Diagnosis not present

## 2019-04-04 DIAGNOSIS — J189 Pneumonia, unspecified organism: Secondary | ICD-10-CM | POA: Diagnosis not present

## 2019-04-04 DIAGNOSIS — E119 Type 2 diabetes mellitus without complications: Secondary | ICD-10-CM | POA: Diagnosis not present

## 2019-04-04 DIAGNOSIS — R1310 Dysphagia, unspecified: Secondary | ICD-10-CM | POA: Diagnosis not present

## 2019-04-04 DIAGNOSIS — I4891 Unspecified atrial fibrillation: Secondary | ICD-10-CM | POA: Diagnosis not present

## 2019-04-23 DIAGNOSIS — G4701 Insomnia due to medical condition: Secondary | ICD-10-CM | POA: Diagnosis not present

## 2019-04-23 DIAGNOSIS — F064 Anxiety disorder due to known physiological condition: Secondary | ICD-10-CM | POA: Diagnosis not present

## 2019-04-23 DIAGNOSIS — G301 Alzheimer's disease with late onset: Secondary | ICD-10-CM | POA: Diagnosis not present

## 2019-04-23 DIAGNOSIS — F605 Obsessive-compulsive personality disorder: Secondary | ICD-10-CM | POA: Diagnosis not present

## 2019-04-23 DIAGNOSIS — F331 Major depressive disorder, recurrent, moderate: Secondary | ICD-10-CM | POA: Diagnosis not present

## 2019-04-24 DIAGNOSIS — E1159 Type 2 diabetes mellitus with other circulatory complications: Secondary | ICD-10-CM | POA: Diagnosis not present

## 2019-04-24 DIAGNOSIS — Q845 Enlarged and hypertrophic nails: Secondary | ICD-10-CM | POA: Diagnosis not present

## 2019-04-24 DIAGNOSIS — B351 Tinea unguium: Secondary | ICD-10-CM | POA: Diagnosis not present

## 2019-04-24 DIAGNOSIS — I739 Peripheral vascular disease, unspecified: Secondary | ICD-10-CM | POA: Diagnosis not present

## 2019-04-25 DIAGNOSIS — E785 Hyperlipidemia, unspecified: Secondary | ICD-10-CM | POA: Diagnosis not present

## 2019-04-25 DIAGNOSIS — E119 Type 2 diabetes mellitus without complications: Secondary | ICD-10-CM | POA: Diagnosis not present

## 2019-04-25 DIAGNOSIS — D649 Anemia, unspecified: Secondary | ICD-10-CM | POA: Diagnosis not present

## 2019-04-25 DIAGNOSIS — I10 Essential (primary) hypertension: Secondary | ICD-10-CM | POA: Diagnosis not present

## 2019-05-07 DIAGNOSIS — F331 Major depressive disorder, recurrent, moderate: Secondary | ICD-10-CM | POA: Diagnosis not present

## 2019-05-07 DIAGNOSIS — G301 Alzheimer's disease with late onset: Secondary | ICD-10-CM | POA: Diagnosis not present

## 2019-05-07 DIAGNOSIS — G4701 Insomnia due to medical condition: Secondary | ICD-10-CM | POA: Diagnosis not present

## 2019-05-07 DIAGNOSIS — F064 Anxiety disorder due to known physiological condition: Secondary | ICD-10-CM | POA: Diagnosis not present

## 2019-05-08 DIAGNOSIS — I4891 Unspecified atrial fibrillation: Secondary | ICD-10-CM | POA: Diagnosis not present

## 2019-05-08 DIAGNOSIS — F0391 Unspecified dementia with behavioral disturbance: Secondary | ICD-10-CM | POA: Diagnosis not present

## 2019-05-08 DIAGNOSIS — J189 Pneumonia, unspecified organism: Secondary | ICD-10-CM | POA: Diagnosis not present

## 2019-05-08 DIAGNOSIS — E1122 Type 2 diabetes mellitus with diabetic chronic kidney disease: Secondary | ICD-10-CM | POA: Diagnosis not present

## 2019-05-08 DIAGNOSIS — E039 Hypothyroidism, unspecified: Secondary | ICD-10-CM | POA: Diagnosis not present

## 2019-05-16 DIAGNOSIS — Z20828 Contact with and (suspected) exposure to other viral communicable diseases: Secondary | ICD-10-CM | POA: Diagnosis not present

## 2019-05-17 DIAGNOSIS — F329 Major depressive disorder, single episode, unspecified: Secondary | ICD-10-CM | POA: Diagnosis not present

## 2019-05-17 DIAGNOSIS — I509 Heart failure, unspecified: Secondary | ICD-10-CM | POA: Diagnosis not present

## 2019-05-17 DIAGNOSIS — E119 Type 2 diabetes mellitus without complications: Secondary | ICD-10-CM | POA: Diagnosis not present

## 2019-05-17 DIAGNOSIS — I4891 Unspecified atrial fibrillation: Secondary | ICD-10-CM | POA: Diagnosis not present

## 2019-05-21 DIAGNOSIS — F0280 Dementia in other diseases classified elsewhere without behavioral disturbance: Secondary | ICD-10-CM | POA: Diagnosis not present

## 2019-05-21 DIAGNOSIS — F064 Anxiety disorder due to known physiological condition: Secondary | ICD-10-CM | POA: Diagnosis not present

## 2019-05-21 DIAGNOSIS — G301 Alzheimer's disease with late onset: Secondary | ICD-10-CM | POA: Diagnosis not present

## 2019-05-21 DIAGNOSIS — F331 Major depressive disorder, recurrent, moderate: Secondary | ICD-10-CM | POA: Diagnosis not present

## 2019-05-21 DIAGNOSIS — G4701 Insomnia due to medical condition: Secondary | ICD-10-CM | POA: Diagnosis not present

## 2019-05-22 DIAGNOSIS — Z20828 Contact with and (suspected) exposure to other viral communicable diseases: Secondary | ICD-10-CM | POA: Diagnosis not present

## 2019-05-24 DIAGNOSIS — E119 Type 2 diabetes mellitus without complications: Secondary | ICD-10-CM | POA: Diagnosis not present

## 2019-05-24 DIAGNOSIS — R1312 Dysphagia, oropharyngeal phase: Secondary | ICD-10-CM | POA: Diagnosis not present

## 2019-05-24 DIAGNOSIS — J45909 Unspecified asthma, uncomplicated: Secondary | ICD-10-CM | POA: Diagnosis not present

## 2019-05-24 DIAGNOSIS — R296 Repeated falls: Secondary | ICD-10-CM | POA: Diagnosis not present

## 2019-05-24 DIAGNOSIS — J189 Pneumonia, unspecified organism: Secondary | ICD-10-CM | POA: Diagnosis not present

## 2019-05-24 DIAGNOSIS — R278 Other lack of coordination: Secondary | ICD-10-CM | POA: Diagnosis not present

## 2019-05-24 DIAGNOSIS — Z5181 Encounter for therapeutic drug level monitoring: Secondary | ICD-10-CM | POA: Diagnosis not present

## 2019-05-24 DIAGNOSIS — M6281 Muscle weakness (generalized): Secondary | ICD-10-CM | POA: Diagnosis not present

## 2019-05-24 DIAGNOSIS — R293 Abnormal posture: Secondary | ICD-10-CM | POA: Diagnosis not present

## 2019-05-24 DIAGNOSIS — I10 Essential (primary) hypertension: Secondary | ICD-10-CM | POA: Diagnosis not present

## 2019-05-27 DIAGNOSIS — Z5181 Encounter for therapeutic drug level monitoring: Secondary | ICD-10-CM | POA: Diagnosis not present

## 2019-05-27 DIAGNOSIS — J189 Pneumonia, unspecified organism: Secondary | ICD-10-CM | POA: Diagnosis not present

## 2019-05-27 DIAGNOSIS — E119 Type 2 diabetes mellitus without complications: Secondary | ICD-10-CM | POA: Diagnosis not present

## 2019-05-27 DIAGNOSIS — R278 Other lack of coordination: Secondary | ICD-10-CM | POA: Diagnosis not present

## 2019-05-27 DIAGNOSIS — R293 Abnormal posture: Secondary | ICD-10-CM | POA: Diagnosis not present

## 2019-05-27 DIAGNOSIS — R296 Repeated falls: Secondary | ICD-10-CM | POA: Diagnosis not present

## 2019-05-28 DIAGNOSIS — J189 Pneumonia, unspecified organism: Secondary | ICD-10-CM | POA: Diagnosis not present

## 2019-05-28 DIAGNOSIS — R293 Abnormal posture: Secondary | ICD-10-CM | POA: Diagnosis not present

## 2019-05-28 DIAGNOSIS — E119 Type 2 diabetes mellitus without complications: Secondary | ICD-10-CM | POA: Diagnosis not present

## 2019-05-28 DIAGNOSIS — Z5181 Encounter for therapeutic drug level monitoring: Secondary | ICD-10-CM | POA: Diagnosis not present

## 2019-05-28 DIAGNOSIS — R278 Other lack of coordination: Secondary | ICD-10-CM | POA: Diagnosis not present

## 2019-05-28 DIAGNOSIS — R296 Repeated falls: Secondary | ICD-10-CM | POA: Diagnosis not present

## 2019-05-29 DIAGNOSIS — J189 Pneumonia, unspecified organism: Secondary | ICD-10-CM | POA: Diagnosis not present

## 2019-05-29 DIAGNOSIS — Z5181 Encounter for therapeutic drug level monitoring: Secondary | ICD-10-CM | POA: Diagnosis not present

## 2019-05-29 DIAGNOSIS — R296 Repeated falls: Secondary | ICD-10-CM | POA: Diagnosis not present

## 2019-05-29 DIAGNOSIS — R278 Other lack of coordination: Secondary | ICD-10-CM | POA: Diagnosis not present

## 2019-05-29 DIAGNOSIS — R293 Abnormal posture: Secondary | ICD-10-CM | POA: Diagnosis not present

## 2019-05-29 DIAGNOSIS — E119 Type 2 diabetes mellitus without complications: Secondary | ICD-10-CM | POA: Diagnosis not present

## 2019-05-30 DIAGNOSIS — R296 Repeated falls: Secondary | ICD-10-CM | POA: Diagnosis not present

## 2019-05-30 DIAGNOSIS — R293 Abnormal posture: Secondary | ICD-10-CM | POA: Diagnosis not present

## 2019-05-30 DIAGNOSIS — R278 Other lack of coordination: Secondary | ICD-10-CM | POA: Diagnosis not present

## 2019-05-30 DIAGNOSIS — J189 Pneumonia, unspecified organism: Secondary | ICD-10-CM | POA: Diagnosis not present

## 2019-05-30 DIAGNOSIS — E119 Type 2 diabetes mellitus without complications: Secondary | ICD-10-CM | POA: Diagnosis not present

## 2019-05-30 DIAGNOSIS — Z5181 Encounter for therapeutic drug level monitoring: Secondary | ICD-10-CM | POA: Diagnosis not present

## 2019-05-31 DIAGNOSIS — R296 Repeated falls: Secondary | ICD-10-CM | POA: Diagnosis not present

## 2019-05-31 DIAGNOSIS — Z5181 Encounter for therapeutic drug level monitoring: Secondary | ICD-10-CM | POA: Diagnosis not present

## 2019-05-31 DIAGNOSIS — J189 Pneumonia, unspecified organism: Secondary | ICD-10-CM | POA: Diagnosis not present

## 2019-05-31 DIAGNOSIS — R293 Abnormal posture: Secondary | ICD-10-CM | POA: Diagnosis not present

## 2019-05-31 DIAGNOSIS — R278 Other lack of coordination: Secondary | ICD-10-CM | POA: Diagnosis not present

## 2019-05-31 DIAGNOSIS — E119 Type 2 diabetes mellitus without complications: Secondary | ICD-10-CM | POA: Diagnosis not present

## 2019-06-03 DIAGNOSIS — R293 Abnormal posture: Secondary | ICD-10-CM | POA: Diagnosis not present

## 2019-06-03 DIAGNOSIS — J189 Pneumonia, unspecified organism: Secondary | ICD-10-CM | POA: Diagnosis not present

## 2019-06-03 DIAGNOSIS — Z5181 Encounter for therapeutic drug level monitoring: Secondary | ICD-10-CM | POA: Diagnosis not present

## 2019-06-03 DIAGNOSIS — R278 Other lack of coordination: Secondary | ICD-10-CM | POA: Diagnosis not present

## 2019-06-03 DIAGNOSIS — R296 Repeated falls: Secondary | ICD-10-CM | POA: Diagnosis not present

## 2019-06-03 DIAGNOSIS — E119 Type 2 diabetes mellitus without complications: Secondary | ICD-10-CM | POA: Diagnosis not present

## 2019-06-04 DIAGNOSIS — R293 Abnormal posture: Secondary | ICD-10-CM | POA: Diagnosis not present

## 2019-06-04 DIAGNOSIS — J189 Pneumonia, unspecified organism: Secondary | ICD-10-CM | POA: Diagnosis not present

## 2019-06-04 DIAGNOSIS — R278 Other lack of coordination: Secondary | ICD-10-CM | POA: Diagnosis not present

## 2019-06-04 DIAGNOSIS — Z5181 Encounter for therapeutic drug level monitoring: Secondary | ICD-10-CM | POA: Diagnosis not present

## 2019-06-04 DIAGNOSIS — R296 Repeated falls: Secondary | ICD-10-CM | POA: Diagnosis not present

## 2019-06-04 DIAGNOSIS — E119 Type 2 diabetes mellitus without complications: Secondary | ICD-10-CM | POA: Diagnosis not present

## 2019-06-04 DIAGNOSIS — Z20828 Contact with and (suspected) exposure to other viral communicable diseases: Secondary | ICD-10-CM | POA: Diagnosis not present

## 2019-06-05 DIAGNOSIS — Z5181 Encounter for therapeutic drug level monitoring: Secondary | ICD-10-CM | POA: Diagnosis not present

## 2019-06-05 DIAGNOSIS — R278 Other lack of coordination: Secondary | ICD-10-CM | POA: Diagnosis not present

## 2019-06-05 DIAGNOSIS — R296 Repeated falls: Secondary | ICD-10-CM | POA: Diagnosis not present

## 2019-06-05 DIAGNOSIS — E119 Type 2 diabetes mellitus without complications: Secondary | ICD-10-CM | POA: Diagnosis not present

## 2019-06-05 DIAGNOSIS — R293 Abnormal posture: Secondary | ICD-10-CM | POA: Diagnosis not present

## 2019-06-05 DIAGNOSIS — J189 Pneumonia, unspecified organism: Secondary | ICD-10-CM | POA: Diagnosis not present

## 2019-06-06 DIAGNOSIS — R293 Abnormal posture: Secondary | ICD-10-CM | POA: Diagnosis not present

## 2019-06-06 DIAGNOSIS — R296 Repeated falls: Secondary | ICD-10-CM | POA: Diagnosis not present

## 2019-06-06 DIAGNOSIS — Z5181 Encounter for therapeutic drug level monitoring: Secondary | ICD-10-CM | POA: Diagnosis not present

## 2019-06-06 DIAGNOSIS — R278 Other lack of coordination: Secondary | ICD-10-CM | POA: Diagnosis not present

## 2019-06-06 DIAGNOSIS — J189 Pneumonia, unspecified organism: Secondary | ICD-10-CM | POA: Diagnosis not present

## 2019-06-06 DIAGNOSIS — E119 Type 2 diabetes mellitus without complications: Secondary | ICD-10-CM | POA: Diagnosis not present

## 2019-06-07 DIAGNOSIS — J189 Pneumonia, unspecified organism: Secondary | ICD-10-CM | POA: Diagnosis not present

## 2019-06-07 DIAGNOSIS — E119 Type 2 diabetes mellitus without complications: Secondary | ICD-10-CM | POA: Diagnosis not present

## 2019-06-07 DIAGNOSIS — R296 Repeated falls: Secondary | ICD-10-CM | POA: Diagnosis not present

## 2019-06-07 DIAGNOSIS — R293 Abnormal posture: Secondary | ICD-10-CM | POA: Diagnosis not present

## 2019-06-07 DIAGNOSIS — Z5181 Encounter for therapeutic drug level monitoring: Secondary | ICD-10-CM | POA: Diagnosis not present

## 2019-06-07 DIAGNOSIS — R278 Other lack of coordination: Secondary | ICD-10-CM | POA: Diagnosis not present

## 2019-06-11 DIAGNOSIS — E119 Type 2 diabetes mellitus without complications: Secondary | ICD-10-CM | POA: Diagnosis not present

## 2019-06-11 DIAGNOSIS — R293 Abnormal posture: Secondary | ICD-10-CM | POA: Diagnosis not present

## 2019-06-11 DIAGNOSIS — Z5181 Encounter for therapeutic drug level monitoring: Secondary | ICD-10-CM | POA: Diagnosis not present

## 2019-06-11 DIAGNOSIS — Z20828 Contact with and (suspected) exposure to other viral communicable diseases: Secondary | ICD-10-CM | POA: Diagnosis not present

## 2019-06-11 DIAGNOSIS — J189 Pneumonia, unspecified organism: Secondary | ICD-10-CM | POA: Diagnosis not present

## 2019-06-11 DIAGNOSIS — R296 Repeated falls: Secondary | ICD-10-CM | POA: Diagnosis not present

## 2019-06-11 DIAGNOSIS — R278 Other lack of coordination: Secondary | ICD-10-CM | POA: Diagnosis not present

## 2019-06-12 DIAGNOSIS — J189 Pneumonia, unspecified organism: Secondary | ICD-10-CM | POA: Diagnosis not present

## 2019-06-12 DIAGNOSIS — R278 Other lack of coordination: Secondary | ICD-10-CM | POA: Diagnosis not present

## 2019-06-12 DIAGNOSIS — E119 Type 2 diabetes mellitus without complications: Secondary | ICD-10-CM | POA: Diagnosis not present

## 2019-06-12 DIAGNOSIS — R293 Abnormal posture: Secondary | ICD-10-CM | POA: Diagnosis not present

## 2019-06-12 DIAGNOSIS — Z5181 Encounter for therapeutic drug level monitoring: Secondary | ICD-10-CM | POA: Diagnosis not present

## 2019-06-12 DIAGNOSIS — R296 Repeated falls: Secondary | ICD-10-CM | POA: Diagnosis not present

## 2019-06-13 DIAGNOSIS — Z5181 Encounter for therapeutic drug level monitoring: Secondary | ICD-10-CM | POA: Diagnosis not present

## 2019-06-13 DIAGNOSIS — E119 Type 2 diabetes mellitus without complications: Secondary | ICD-10-CM | POA: Diagnosis not present

## 2019-06-13 DIAGNOSIS — R296 Repeated falls: Secondary | ICD-10-CM | POA: Diagnosis not present

## 2019-06-13 DIAGNOSIS — R293 Abnormal posture: Secondary | ICD-10-CM | POA: Diagnosis not present

## 2019-06-13 DIAGNOSIS — J189 Pneumonia, unspecified organism: Secondary | ICD-10-CM | POA: Diagnosis not present

## 2019-06-13 DIAGNOSIS — R278 Other lack of coordination: Secondary | ICD-10-CM | POA: Diagnosis not present

## 2019-06-14 DIAGNOSIS — E119 Type 2 diabetes mellitus without complications: Secondary | ICD-10-CM | POA: Diagnosis not present

## 2019-06-14 DIAGNOSIS — R278 Other lack of coordination: Secondary | ICD-10-CM | POA: Diagnosis not present

## 2019-06-14 DIAGNOSIS — R296 Repeated falls: Secondary | ICD-10-CM | POA: Diagnosis not present

## 2019-06-14 DIAGNOSIS — R293 Abnormal posture: Secondary | ICD-10-CM | POA: Diagnosis not present

## 2019-06-14 DIAGNOSIS — Z5181 Encounter for therapeutic drug level monitoring: Secondary | ICD-10-CM | POA: Diagnosis not present

## 2019-06-14 DIAGNOSIS — J189 Pneumonia, unspecified organism: Secondary | ICD-10-CM | POA: Diagnosis not present

## 2019-06-17 DIAGNOSIS — R278 Other lack of coordination: Secondary | ICD-10-CM | POA: Diagnosis not present

## 2019-06-17 DIAGNOSIS — J189 Pneumonia, unspecified organism: Secondary | ICD-10-CM | POA: Diagnosis not present

## 2019-06-17 DIAGNOSIS — I10 Essential (primary) hypertension: Secondary | ICD-10-CM | POA: Diagnosis not present

## 2019-06-17 DIAGNOSIS — R293 Abnormal posture: Secondary | ICD-10-CM | POA: Diagnosis not present

## 2019-06-17 DIAGNOSIS — M6281 Muscle weakness (generalized): Secondary | ICD-10-CM | POA: Diagnosis not present

## 2019-06-17 DIAGNOSIS — J45909 Unspecified asthma, uncomplicated: Secondary | ICD-10-CM | POA: Diagnosis not present

## 2019-06-17 DIAGNOSIS — R1312 Dysphagia, oropharyngeal phase: Secondary | ICD-10-CM | POA: Diagnosis not present

## 2019-06-17 DIAGNOSIS — R296 Repeated falls: Secondary | ICD-10-CM | POA: Diagnosis not present

## 2019-06-17 DIAGNOSIS — Z5181 Encounter for therapeutic drug level monitoring: Secondary | ICD-10-CM | POA: Diagnosis not present

## 2019-06-17 DIAGNOSIS — E119 Type 2 diabetes mellitus without complications: Secondary | ICD-10-CM | POA: Diagnosis not present

## 2019-06-18 DIAGNOSIS — Z5181 Encounter for therapeutic drug level monitoring: Secondary | ICD-10-CM | POA: Diagnosis not present

## 2019-06-18 DIAGNOSIS — E119 Type 2 diabetes mellitus without complications: Secondary | ICD-10-CM | POA: Diagnosis not present

## 2019-06-18 DIAGNOSIS — F331 Major depressive disorder, recurrent, moderate: Secondary | ICD-10-CM | POA: Diagnosis not present

## 2019-06-18 DIAGNOSIS — G4701 Insomnia due to medical condition: Secondary | ICD-10-CM | POA: Diagnosis not present

## 2019-06-18 DIAGNOSIS — F0280 Dementia in other diseases classified elsewhere without behavioral disturbance: Secondary | ICD-10-CM | POA: Diagnosis not present

## 2019-06-18 DIAGNOSIS — J189 Pneumonia, unspecified organism: Secondary | ICD-10-CM | POA: Diagnosis not present

## 2019-06-18 DIAGNOSIS — R293 Abnormal posture: Secondary | ICD-10-CM | POA: Diagnosis not present

## 2019-06-18 DIAGNOSIS — R296 Repeated falls: Secondary | ICD-10-CM | POA: Diagnosis not present

## 2019-06-18 DIAGNOSIS — R278 Other lack of coordination: Secondary | ICD-10-CM | POA: Diagnosis not present

## 2019-06-18 DIAGNOSIS — G301 Alzheimer's disease with late onset: Secondary | ICD-10-CM | POA: Diagnosis not present

## 2019-06-18 DIAGNOSIS — Z20828 Contact with and (suspected) exposure to other viral communicable diseases: Secondary | ICD-10-CM | POA: Diagnosis not present

## 2019-06-19 DIAGNOSIS — J189 Pneumonia, unspecified organism: Secondary | ICD-10-CM | POA: Diagnosis not present

## 2019-06-19 DIAGNOSIS — R278 Other lack of coordination: Secondary | ICD-10-CM | POA: Diagnosis not present

## 2019-06-19 DIAGNOSIS — R293 Abnormal posture: Secondary | ICD-10-CM | POA: Diagnosis not present

## 2019-06-19 DIAGNOSIS — Z5181 Encounter for therapeutic drug level monitoring: Secondary | ICD-10-CM | POA: Diagnosis not present

## 2019-06-19 DIAGNOSIS — E119 Type 2 diabetes mellitus without complications: Secondary | ICD-10-CM | POA: Diagnosis not present

## 2019-06-19 DIAGNOSIS — R296 Repeated falls: Secondary | ICD-10-CM | POA: Diagnosis not present

## 2019-06-20 DIAGNOSIS — Z5181 Encounter for therapeutic drug level monitoring: Secondary | ICD-10-CM | POA: Diagnosis not present

## 2019-06-20 DIAGNOSIS — E119 Type 2 diabetes mellitus without complications: Secondary | ICD-10-CM | POA: Diagnosis not present

## 2019-06-20 DIAGNOSIS — R293 Abnormal posture: Secondary | ICD-10-CM | POA: Diagnosis not present

## 2019-06-20 DIAGNOSIS — J189 Pneumonia, unspecified organism: Secondary | ICD-10-CM | POA: Diagnosis not present

## 2019-06-20 DIAGNOSIS — R296 Repeated falls: Secondary | ICD-10-CM | POA: Diagnosis not present

## 2019-06-20 DIAGNOSIS — R278 Other lack of coordination: Secondary | ICD-10-CM | POA: Diagnosis not present

## 2019-06-21 DIAGNOSIS — E119 Type 2 diabetes mellitus without complications: Secondary | ICD-10-CM | POA: Diagnosis not present

## 2019-06-21 DIAGNOSIS — R296 Repeated falls: Secondary | ICD-10-CM | POA: Diagnosis not present

## 2019-06-21 DIAGNOSIS — J189 Pneumonia, unspecified organism: Secondary | ICD-10-CM | POA: Diagnosis not present

## 2019-06-21 DIAGNOSIS — R293 Abnormal posture: Secondary | ICD-10-CM | POA: Diagnosis not present

## 2019-06-21 DIAGNOSIS — R278 Other lack of coordination: Secondary | ICD-10-CM | POA: Diagnosis not present

## 2019-06-21 DIAGNOSIS — Z5181 Encounter for therapeutic drug level monitoring: Secondary | ICD-10-CM | POA: Diagnosis not present

## 2019-06-25 DIAGNOSIS — Z20828 Contact with and (suspected) exposure to other viral communicable diseases: Secondary | ICD-10-CM | POA: Diagnosis not present

## 2019-07-02 DIAGNOSIS — Z20828 Contact with and (suspected) exposure to other viral communicable diseases: Secondary | ICD-10-CM | POA: Diagnosis not present

## 2019-07-03 DIAGNOSIS — F0391 Unspecified dementia with behavioral disturbance: Secondary | ICD-10-CM | POA: Diagnosis not present

## 2019-07-03 DIAGNOSIS — E039 Hypothyroidism, unspecified: Secondary | ICD-10-CM | POA: Diagnosis not present

## 2019-07-03 DIAGNOSIS — E1122 Type 2 diabetes mellitus with diabetic chronic kidney disease: Secondary | ICD-10-CM | POA: Diagnosis not present

## 2019-07-03 DIAGNOSIS — I4891 Unspecified atrial fibrillation: Secondary | ICD-10-CM | POA: Diagnosis not present

## 2019-07-09 DIAGNOSIS — Z20828 Contact with and (suspected) exposure to other viral communicable diseases: Secondary | ICD-10-CM | POA: Diagnosis not present

## 2019-07-16 DIAGNOSIS — S60221D Contusion of right hand, subsequent encounter: Secondary | ICD-10-CM | POA: Diagnosis not present

## 2019-07-16 DIAGNOSIS — L603 Nail dystrophy: Secondary | ICD-10-CM | POA: Diagnosis not present

## 2019-07-16 DIAGNOSIS — F329 Major depressive disorder, single episode, unspecified: Secondary | ICD-10-CM | POA: Diagnosis not present

## 2019-07-16 DIAGNOSIS — B351 Tinea unguium: Secondary | ICD-10-CM | POA: Diagnosis not present

## 2019-07-16 DIAGNOSIS — I739 Peripheral vascular disease, unspecified: Secondary | ICD-10-CM | POA: Diagnosis not present

## 2019-07-16 DIAGNOSIS — M79641 Pain in right hand: Secondary | ICD-10-CM | POA: Diagnosis not present

## 2019-07-16 DIAGNOSIS — R1311 Dysphagia, oral phase: Secondary | ICD-10-CM | POA: Diagnosis not present

## 2019-07-16 DIAGNOSIS — M25531 Pain in right wrist: Secondary | ICD-10-CM | POA: Diagnosis not present

## 2019-07-16 DIAGNOSIS — G4701 Insomnia due to medical condition: Secondary | ICD-10-CM | POA: Diagnosis not present

## 2019-07-16 DIAGNOSIS — E1159 Type 2 diabetes mellitus with other circulatory complications: Secondary | ICD-10-CM | POA: Diagnosis not present

## 2019-07-16 DIAGNOSIS — F064 Anxiety disorder due to known physiological condition: Secondary | ICD-10-CM | POA: Diagnosis not present

## 2019-07-16 DIAGNOSIS — F419 Anxiety disorder, unspecified: Secondary | ICD-10-CM | POA: Diagnosis not present

## 2019-07-16 DIAGNOSIS — I4891 Unspecified atrial fibrillation: Secondary | ICD-10-CM | POA: Diagnosis not present

## 2019-07-16 DIAGNOSIS — G301 Alzheimer's disease with late onset: Secondary | ICD-10-CM | POA: Diagnosis not present

## 2019-07-16 DIAGNOSIS — E119 Type 2 diabetes mellitus without complications: Secondary | ICD-10-CM | POA: Diagnosis not present

## 2019-07-16 DIAGNOSIS — Q845 Enlarged and hypertrophic nails: Secondary | ICD-10-CM | POA: Diagnosis not present

## 2019-07-16 DIAGNOSIS — F331 Major depressive disorder, recurrent, moderate: Secondary | ICD-10-CM | POA: Diagnosis not present

## 2019-07-16 DIAGNOSIS — F0280 Dementia in other diseases classified elsewhere without behavioral disturbance: Secondary | ICD-10-CM | POA: Diagnosis not present

## 2019-07-16 DIAGNOSIS — F0391 Unspecified dementia with behavioral disturbance: Secondary | ICD-10-CM | POA: Diagnosis not present

## 2019-07-16 DIAGNOSIS — Z20828 Contact with and (suspected) exposure to other viral communicable diseases: Secondary | ICD-10-CM | POA: Diagnosis not present

## 2019-07-16 DIAGNOSIS — J1089 Influenza due to other identified influenza virus with other manifestations: Secondary | ICD-10-CM | POA: Diagnosis not present

## 2019-07-18 DIAGNOSIS — F0391 Unspecified dementia with behavioral disturbance: Secondary | ICD-10-CM | POA: Diagnosis not present

## 2019-07-18 DIAGNOSIS — I4891 Unspecified atrial fibrillation: Secondary | ICD-10-CM | POA: Diagnosis not present

## 2019-07-18 DIAGNOSIS — S60221D Contusion of right hand, subsequent encounter: Secondary | ICD-10-CM | POA: Diagnosis not present

## 2019-07-18 DIAGNOSIS — E119 Type 2 diabetes mellitus without complications: Secondary | ICD-10-CM | POA: Diagnosis not present

## 2019-07-23 DIAGNOSIS — G4701 Insomnia due to medical condition: Secondary | ICD-10-CM | POA: Diagnosis not present

## 2019-07-23 DIAGNOSIS — F064 Anxiety disorder due to known physiological condition: Secondary | ICD-10-CM | POA: Diagnosis not present

## 2019-07-23 DIAGNOSIS — F331 Major depressive disorder, recurrent, moderate: Secondary | ICD-10-CM | POA: Diagnosis not present

## 2019-07-23 DIAGNOSIS — F0280 Dementia in other diseases classified elsewhere without behavioral disturbance: Secondary | ICD-10-CM | POA: Diagnosis not present

## 2019-07-23 DIAGNOSIS — G301 Alzheimer's disease with late onset: Secondary | ICD-10-CM | POA: Diagnosis not present

## 2019-08-19 DIAGNOSIS — U071 COVID-19: Secondary | ICD-10-CM | POA: Diagnosis not present

## 2019-08-19 DIAGNOSIS — R1312 Dysphagia, oropharyngeal phase: Secondary | ICD-10-CM | POA: Diagnosis not present

## 2019-08-19 DIAGNOSIS — M6281 Muscle weakness (generalized): Secondary | ICD-10-CM | POA: Diagnosis not present

## 2019-08-19 DIAGNOSIS — Z5181 Encounter for therapeutic drug level monitoring: Secondary | ICD-10-CM | POA: Diagnosis not present

## 2019-08-19 DIAGNOSIS — F419 Anxiety disorder, unspecified: Secondary | ICD-10-CM | POA: Diagnosis not present

## 2019-08-19 DIAGNOSIS — R278 Other lack of coordination: Secondary | ICD-10-CM | POA: Diagnosis not present

## 2019-08-19 DIAGNOSIS — E119 Type 2 diabetes mellitus without complications: Secondary | ICD-10-CM | POA: Diagnosis not present

## 2019-08-19 DIAGNOSIS — J189 Pneumonia, unspecified organism: Secondary | ICD-10-CM | POA: Diagnosis not present

## 2019-08-19 DIAGNOSIS — I10 Essential (primary) hypertension: Secondary | ICD-10-CM | POA: Diagnosis not present

## 2019-08-19 DIAGNOSIS — J45909 Unspecified asthma, uncomplicated: Secondary | ICD-10-CM | POA: Diagnosis not present

## 2019-08-20 DIAGNOSIS — E119 Type 2 diabetes mellitus without complications: Secondary | ICD-10-CM | POA: Diagnosis not present

## 2019-08-20 DIAGNOSIS — F419 Anxiety disorder, unspecified: Secondary | ICD-10-CM | POA: Diagnosis not present

## 2019-08-20 DIAGNOSIS — U071 COVID-19: Secondary | ICD-10-CM | POA: Diagnosis not present

## 2019-08-20 DIAGNOSIS — R278 Other lack of coordination: Secondary | ICD-10-CM | POA: Diagnosis not present

## 2019-08-20 DIAGNOSIS — M6281 Muscle weakness (generalized): Secondary | ICD-10-CM | POA: Diagnosis not present

## 2019-08-20 DIAGNOSIS — J189 Pneumonia, unspecified organism: Secondary | ICD-10-CM | POA: Diagnosis not present

## 2019-08-21 DIAGNOSIS — J189 Pneumonia, unspecified organism: Secondary | ICD-10-CM | POA: Diagnosis not present

## 2019-08-21 DIAGNOSIS — E119 Type 2 diabetes mellitus without complications: Secondary | ICD-10-CM | POA: Diagnosis not present

## 2019-08-21 DIAGNOSIS — M6281 Muscle weakness (generalized): Secondary | ICD-10-CM | POA: Diagnosis not present

## 2019-08-21 DIAGNOSIS — R278 Other lack of coordination: Secondary | ICD-10-CM | POA: Diagnosis not present

## 2019-08-21 DIAGNOSIS — Z23 Encounter for immunization: Secondary | ICD-10-CM | POA: Diagnosis not present

## 2019-08-21 DIAGNOSIS — F419 Anxiety disorder, unspecified: Secondary | ICD-10-CM | POA: Diagnosis not present

## 2019-08-21 DIAGNOSIS — U071 COVID-19: Secondary | ICD-10-CM | POA: Diagnosis not present

## 2019-08-22 DIAGNOSIS — F419 Anxiety disorder, unspecified: Secondary | ICD-10-CM | POA: Diagnosis not present

## 2019-08-22 DIAGNOSIS — E119 Type 2 diabetes mellitus without complications: Secondary | ICD-10-CM | POA: Diagnosis not present

## 2019-08-22 DIAGNOSIS — R278 Other lack of coordination: Secondary | ICD-10-CM | POA: Diagnosis not present

## 2019-08-22 DIAGNOSIS — J189 Pneumonia, unspecified organism: Secondary | ICD-10-CM | POA: Diagnosis not present

## 2019-08-22 DIAGNOSIS — U071 COVID-19: Secondary | ICD-10-CM | POA: Diagnosis not present

## 2019-08-22 DIAGNOSIS — M6281 Muscle weakness (generalized): Secondary | ICD-10-CM | POA: Diagnosis not present

## 2019-08-23 DIAGNOSIS — J189 Pneumonia, unspecified organism: Secondary | ICD-10-CM | POA: Diagnosis not present

## 2019-08-23 DIAGNOSIS — E119 Type 2 diabetes mellitus without complications: Secondary | ICD-10-CM | POA: Diagnosis not present

## 2019-08-23 DIAGNOSIS — F419 Anxiety disorder, unspecified: Secondary | ICD-10-CM | POA: Diagnosis not present

## 2019-08-23 DIAGNOSIS — M6281 Muscle weakness (generalized): Secondary | ICD-10-CM | POA: Diagnosis not present

## 2019-08-23 DIAGNOSIS — R278 Other lack of coordination: Secondary | ICD-10-CM | POA: Diagnosis not present

## 2019-08-23 DIAGNOSIS — U071 COVID-19: Secondary | ICD-10-CM | POA: Diagnosis not present

## 2019-08-24 DIAGNOSIS — J189 Pneumonia, unspecified organism: Secondary | ICD-10-CM | POA: Diagnosis not present

## 2019-08-24 DIAGNOSIS — E119 Type 2 diabetes mellitus without complications: Secondary | ICD-10-CM | POA: Diagnosis not present

## 2019-08-24 DIAGNOSIS — U071 COVID-19: Secondary | ICD-10-CM | POA: Diagnosis not present

## 2019-08-24 DIAGNOSIS — M6281 Muscle weakness (generalized): Secondary | ICD-10-CM | POA: Diagnosis not present

## 2019-08-24 DIAGNOSIS — F419 Anxiety disorder, unspecified: Secondary | ICD-10-CM | POA: Diagnosis not present

## 2019-08-24 DIAGNOSIS — R278 Other lack of coordination: Secondary | ICD-10-CM | POA: Diagnosis not present

## 2019-08-26 DIAGNOSIS — J189 Pneumonia, unspecified organism: Secondary | ICD-10-CM | POA: Diagnosis not present

## 2019-08-26 DIAGNOSIS — R278 Other lack of coordination: Secondary | ICD-10-CM | POA: Diagnosis not present

## 2019-08-26 DIAGNOSIS — Z20828 Contact with and (suspected) exposure to other viral communicable diseases: Secondary | ICD-10-CM | POA: Diagnosis not present

## 2019-08-26 DIAGNOSIS — M6281 Muscle weakness (generalized): Secondary | ICD-10-CM | POA: Diagnosis not present

## 2019-08-26 DIAGNOSIS — U071 COVID-19: Secondary | ICD-10-CM | POA: Diagnosis not present

## 2019-08-26 DIAGNOSIS — E119 Type 2 diabetes mellitus without complications: Secondary | ICD-10-CM | POA: Diagnosis not present

## 2019-08-26 DIAGNOSIS — F419 Anxiety disorder, unspecified: Secondary | ICD-10-CM | POA: Diagnosis not present

## 2019-08-27 DIAGNOSIS — J189 Pneumonia, unspecified organism: Secondary | ICD-10-CM | POA: Diagnosis not present

## 2019-08-27 DIAGNOSIS — F419 Anxiety disorder, unspecified: Secondary | ICD-10-CM | POA: Diagnosis not present

## 2019-08-27 DIAGNOSIS — U071 COVID-19: Secondary | ICD-10-CM | POA: Diagnosis not present

## 2019-08-27 DIAGNOSIS — E119 Type 2 diabetes mellitus without complications: Secondary | ICD-10-CM | POA: Diagnosis not present

## 2019-08-27 DIAGNOSIS — M6281 Muscle weakness (generalized): Secondary | ICD-10-CM | POA: Diagnosis not present

## 2019-08-27 DIAGNOSIS — R278 Other lack of coordination: Secondary | ICD-10-CM | POA: Diagnosis not present

## 2019-08-28 DIAGNOSIS — J189 Pneumonia, unspecified organism: Secondary | ICD-10-CM | POA: Diagnosis not present

## 2019-08-28 DIAGNOSIS — M6281 Muscle weakness (generalized): Secondary | ICD-10-CM | POA: Diagnosis not present

## 2019-08-28 DIAGNOSIS — U071 COVID-19: Secondary | ICD-10-CM | POA: Diagnosis not present

## 2019-08-28 DIAGNOSIS — R278 Other lack of coordination: Secondary | ICD-10-CM | POA: Diagnosis not present

## 2019-08-28 DIAGNOSIS — E119 Type 2 diabetes mellitus without complications: Secondary | ICD-10-CM | POA: Diagnosis not present

## 2019-08-28 DIAGNOSIS — F419 Anxiety disorder, unspecified: Secondary | ICD-10-CM | POA: Diagnosis not present

## 2019-08-29 DIAGNOSIS — M6281 Muscle weakness (generalized): Secondary | ICD-10-CM | POA: Diagnosis not present

## 2019-08-29 DIAGNOSIS — R278 Other lack of coordination: Secondary | ICD-10-CM | POA: Diagnosis not present

## 2019-08-29 DIAGNOSIS — F419 Anxiety disorder, unspecified: Secondary | ICD-10-CM | POA: Diagnosis not present

## 2019-08-29 DIAGNOSIS — J189 Pneumonia, unspecified organism: Secondary | ICD-10-CM | POA: Diagnosis not present

## 2019-08-29 DIAGNOSIS — U071 COVID-19: Secondary | ICD-10-CM | POA: Diagnosis not present

## 2019-08-29 DIAGNOSIS — I4891 Unspecified atrial fibrillation: Secondary | ICD-10-CM | POA: Diagnosis not present

## 2019-08-29 DIAGNOSIS — E119 Type 2 diabetes mellitus without complications: Secondary | ICD-10-CM | POA: Diagnosis not present

## 2019-08-30 DIAGNOSIS — E119 Type 2 diabetes mellitus without complications: Secondary | ICD-10-CM | POA: Diagnosis not present

## 2019-08-30 DIAGNOSIS — J189 Pneumonia, unspecified organism: Secondary | ICD-10-CM | POA: Diagnosis not present

## 2019-08-30 DIAGNOSIS — R278 Other lack of coordination: Secondary | ICD-10-CM | POA: Diagnosis not present

## 2019-08-30 DIAGNOSIS — F419 Anxiety disorder, unspecified: Secondary | ICD-10-CM | POA: Diagnosis not present

## 2019-08-30 DIAGNOSIS — M6281 Muscle weakness (generalized): Secondary | ICD-10-CM | POA: Diagnosis not present

## 2019-08-30 DIAGNOSIS — U071 COVID-19: Secondary | ICD-10-CM | POA: Diagnosis not present

## 2019-09-01 DIAGNOSIS — M6281 Muscle weakness (generalized): Secondary | ICD-10-CM | POA: Diagnosis not present

## 2019-09-01 DIAGNOSIS — J189 Pneumonia, unspecified organism: Secondary | ICD-10-CM | POA: Diagnosis not present

## 2019-09-01 DIAGNOSIS — R1312 Dysphagia, oropharyngeal phase: Secondary | ICD-10-CM | POA: Diagnosis not present

## 2019-09-01 DIAGNOSIS — R278 Other lack of coordination: Secondary | ICD-10-CM | POA: Diagnosis not present

## 2019-09-01 DIAGNOSIS — I10 Essential (primary) hypertension: Secondary | ICD-10-CM | POA: Diagnosis not present

## 2019-09-01 DIAGNOSIS — U071 COVID-19: Secondary | ICD-10-CM | POA: Diagnosis not present

## 2019-09-01 DIAGNOSIS — F419 Anxiety disorder, unspecified: Secondary | ICD-10-CM | POA: Diagnosis not present

## 2019-09-01 DIAGNOSIS — J45909 Unspecified asthma, uncomplicated: Secondary | ICD-10-CM | POA: Diagnosis not present

## 2019-09-01 DIAGNOSIS — E119 Type 2 diabetes mellitus without complications: Secondary | ICD-10-CM | POA: Diagnosis not present

## 2019-09-02 ENCOUNTER — Emergency Department (HOSPITAL_COMMUNITY): Payer: Medicare Other

## 2019-09-02 ENCOUNTER — Other Ambulatory Visit: Payer: Self-pay

## 2019-09-02 ENCOUNTER — Encounter (HOSPITAL_COMMUNITY): Payer: Self-pay | Admitting: Emergency Medicine

## 2019-09-02 ENCOUNTER — Inpatient Hospital Stay (HOSPITAL_COMMUNITY)
Admission: EM | Admit: 2019-09-02 | Discharge: 2019-09-16 | DRG: 177 | Disposition: E | Payer: Medicare Other | Attending: Pulmonary Disease | Admitting: Pulmonary Disease

## 2019-09-02 DIAGNOSIS — R4182 Altered mental status, unspecified: Secondary | ICD-10-CM | POA: Diagnosis present

## 2019-09-02 DIAGNOSIS — E1165 Type 2 diabetes mellitus with hyperglycemia: Secondary | ICD-10-CM | POA: Diagnosis not present

## 2019-09-02 DIAGNOSIS — R410 Disorientation, unspecified: Secondary | ICD-10-CM | POA: Diagnosis not present

## 2019-09-02 DIAGNOSIS — I959 Hypotension, unspecified: Secondary | ICD-10-CM | POA: Diagnosis not present

## 2019-09-02 DIAGNOSIS — Z794 Long term (current) use of insulin: Secondary | ICD-10-CM | POA: Diagnosis not present

## 2019-09-02 DIAGNOSIS — K219 Gastro-esophageal reflux disease without esophagitis: Secondary | ICD-10-CM | POA: Diagnosis present

## 2019-09-02 DIAGNOSIS — G931 Anoxic brain damage, not elsewhere classified: Secondary | ICD-10-CM | POA: Diagnosis present

## 2019-09-02 DIAGNOSIS — I5032 Chronic diastolic (congestive) heart failure: Secondary | ICD-10-CM | POA: Diagnosis present

## 2019-09-02 DIAGNOSIS — R0902 Hypoxemia: Secondary | ICD-10-CM | POA: Diagnosis not present

## 2019-09-02 DIAGNOSIS — J1282 Pneumonia due to coronavirus disease 2019: Secondary | ICD-10-CM | POA: Diagnosis present

## 2019-09-02 DIAGNOSIS — Z8673 Personal history of transient ischemic attack (TIA), and cerebral infarction without residual deficits: Secondary | ICD-10-CM

## 2019-09-02 DIAGNOSIS — I130 Hypertensive heart and chronic kidney disease with heart failure and stage 1 through stage 4 chronic kidney disease, or unspecified chronic kidney disease: Secondary | ICD-10-CM | POA: Diagnosis present

## 2019-09-02 DIAGNOSIS — E785 Hyperlipidemia, unspecified: Secondary | ICD-10-CM | POA: Diagnosis present

## 2019-09-02 DIAGNOSIS — R230 Cyanosis: Secondary | ICD-10-CM | POA: Diagnosis not present

## 2019-09-02 DIAGNOSIS — I4821 Permanent atrial fibrillation: Secondary | ICD-10-CM | POA: Diagnosis present

## 2019-09-02 DIAGNOSIS — I2510 Atherosclerotic heart disease of native coronary artery without angina pectoris: Secondary | ICD-10-CM | POA: Diagnosis present

## 2019-09-02 DIAGNOSIS — Z8249 Family history of ischemic heart disease and other diseases of the circulatory system: Secondary | ICD-10-CM

## 2019-09-02 DIAGNOSIS — J188 Other pneumonia, unspecified organism: Secondary | ICD-10-CM

## 2019-09-02 DIAGNOSIS — Z79899 Other long term (current) drug therapy: Secondary | ICD-10-CM

## 2019-09-02 DIAGNOSIS — R069 Unspecified abnormalities of breathing: Secondary | ICD-10-CM | POA: Diagnosis not present

## 2019-09-02 DIAGNOSIS — E039 Hypothyroidism, unspecified: Secondary | ICD-10-CM | POA: Diagnosis present

## 2019-09-02 DIAGNOSIS — Z90710 Acquired absence of both cervix and uterus: Secondary | ICD-10-CM

## 2019-09-02 DIAGNOSIS — I10 Essential (primary) hypertension: Secondary | ICD-10-CM | POA: Diagnosis not present

## 2019-09-02 DIAGNOSIS — E119 Type 2 diabetes mellitus without complications: Secondary | ICD-10-CM

## 2019-09-02 DIAGNOSIS — T380X5A Adverse effect of glucocorticoids and synthetic analogues, initial encounter: Secondary | ICD-10-CM | POA: Diagnosis not present

## 2019-09-02 DIAGNOSIS — F0390 Unspecified dementia without behavioral disturbance: Secondary | ICD-10-CM | POA: Diagnosis present

## 2019-09-02 DIAGNOSIS — J189 Pneumonia, unspecified organism: Secondary | ICD-10-CM | POA: Diagnosis not present

## 2019-09-02 DIAGNOSIS — Z515 Encounter for palliative care: Secondary | ICD-10-CM | POA: Diagnosis not present

## 2019-09-02 DIAGNOSIS — D689 Coagulation defect, unspecified: Secondary | ICD-10-CM | POA: Diagnosis present

## 2019-09-02 DIAGNOSIS — U071 COVID-19: Secondary | ICD-10-CM | POA: Diagnosis not present

## 2019-09-02 DIAGNOSIS — Z803 Family history of malignant neoplasm of breast: Secondary | ICD-10-CM

## 2019-09-02 DIAGNOSIS — Z66 Do not resuscitate: Secondary | ICD-10-CM | POA: Diagnosis not present

## 2019-09-02 DIAGNOSIS — Z825 Family history of asthma and other chronic lower respiratory diseases: Secondary | ICD-10-CM

## 2019-09-02 DIAGNOSIS — I4891 Unspecified atrial fibrillation: Secondary | ICD-10-CM | POA: Diagnosis not present

## 2019-09-02 DIAGNOSIS — Z833 Family history of diabetes mellitus: Secondary | ICD-10-CM

## 2019-09-02 DIAGNOSIS — K5900 Constipation, unspecified: Secondary | ICD-10-CM | POA: Diagnosis present

## 2019-09-02 DIAGNOSIS — R0602 Shortness of breath: Secondary | ICD-10-CM | POA: Diagnosis not present

## 2019-09-02 DIAGNOSIS — Z87891 Personal history of nicotine dependence: Secondary | ICD-10-CM

## 2019-09-02 DIAGNOSIS — J9601 Acute respiratory failure with hypoxia: Secondary | ICD-10-CM | POA: Diagnosis present

## 2019-09-02 DIAGNOSIS — Z7901 Long term (current) use of anticoagulants: Secondary | ICD-10-CM

## 2019-09-02 DIAGNOSIS — N183 Chronic kidney disease, stage 3 unspecified: Secondary | ICD-10-CM

## 2019-09-02 DIAGNOSIS — R0600 Dyspnea, unspecified: Secondary | ICD-10-CM

## 2019-09-02 DIAGNOSIS — N1832 Chronic kidney disease, stage 3b: Secondary | ICD-10-CM | POA: Diagnosis not present

## 2019-09-02 DIAGNOSIS — Z79890 Hormone replacement therapy (postmenopausal): Secondary | ICD-10-CM

## 2019-09-02 DIAGNOSIS — D631 Anemia in chronic kidney disease: Secondary | ICD-10-CM | POA: Diagnosis present

## 2019-09-02 DIAGNOSIS — R509 Fever, unspecified: Secondary | ICD-10-CM | POA: Diagnosis not present

## 2019-09-02 DIAGNOSIS — Z9011 Acquired absence of right breast and nipple: Secondary | ICD-10-CM

## 2019-09-02 DIAGNOSIS — E1122 Type 2 diabetes mellitus with diabetic chronic kidney disease: Secondary | ICD-10-CM | POA: Diagnosis present

## 2019-09-02 LAB — CBC WITH DIFFERENTIAL/PLATELET
Abs Immature Granulocytes: 0.04 10*3/uL (ref 0.00–0.07)
Basophils Absolute: 0 10*3/uL (ref 0.0–0.1)
Basophils Relative: 0 %
Eosinophils Absolute: 0 10*3/uL (ref 0.0–0.5)
Eosinophils Relative: 0 %
HCT: 36.1 % (ref 36.0–46.0)
Hemoglobin: 11.6 g/dL — ABNORMAL LOW (ref 12.0–15.0)
Immature Granulocytes: 1 %
Lymphocytes Relative: 5 %
Lymphs Abs: 0.4 10*3/uL — ABNORMAL LOW (ref 0.7–4.0)
MCH: 28.4 pg (ref 26.0–34.0)
MCHC: 32.1 g/dL (ref 30.0–36.0)
MCV: 88.5 fL (ref 80.0–100.0)
Monocytes Absolute: 0.4 10*3/uL (ref 0.1–1.0)
Monocytes Relative: 4 %
Neutro Abs: 8.1 10*3/uL — ABNORMAL HIGH (ref 1.7–7.7)
Neutrophils Relative %: 90 %
Platelets: 187 10*3/uL (ref 150–400)
RBC: 4.08 MIL/uL (ref 3.87–5.11)
RDW: 14 % (ref 11.5–15.5)
WBC: 8.9 10*3/uL (ref 4.0–10.5)
nRBC: 0 % (ref 0.0–0.2)

## 2019-09-02 LAB — COMPREHENSIVE METABOLIC PANEL
ALT: 12 U/L (ref 0–44)
AST: 22 U/L (ref 15–41)
Albumin: 3.2 g/dL — ABNORMAL LOW (ref 3.5–5.0)
Alkaline Phosphatase: 84 U/L (ref 38–126)
Anion gap: 11 (ref 5–15)
BUN: 28 mg/dL — ABNORMAL HIGH (ref 8–23)
CO2: 23 mmol/L (ref 22–32)
Calcium: 8.6 mg/dL — ABNORMAL LOW (ref 8.9–10.3)
Chloride: 104 mmol/L (ref 98–111)
Creatinine, Ser: 1.23 mg/dL — ABNORMAL HIGH (ref 0.44–1.00)
GFR calc Af Amer: 46 mL/min — ABNORMAL LOW (ref >60)
GFR calc non Af Amer: 40 mL/min — ABNORMAL LOW (ref >60)
Glucose, Bld: 257 mg/dL — ABNORMAL HIGH (ref 70–99)
Potassium: 3.7 mmol/L (ref 3.5–5.1)
Sodium: 138 mmol/L (ref 135–145)
Total Bilirubin: 0.9 mg/dL (ref 0.3–1.2)
Total Protein: 6.7 g/dL (ref 6.5–8.1)

## 2019-09-02 LAB — CBG MONITORING, ED: Glucose-Capillary: 216 mg/dL — ABNORMAL HIGH (ref 70–99)

## 2019-09-02 LAB — LACTATE DEHYDROGENASE: LDH: 167 U/L (ref 98–192)

## 2019-09-02 LAB — FERRITIN: Ferritin: 258 ng/mL (ref 11–307)

## 2019-09-02 LAB — PROTIME-INR
INR: 4.4 (ref 0.8–1.2)
Prothrombin Time: 42.1 seconds — ABNORMAL HIGH (ref 11.4–15.2)

## 2019-09-02 LAB — C-REACTIVE PROTEIN: CRP: 24.6 mg/dL — ABNORMAL HIGH (ref <1.0)

## 2019-09-02 LAB — TRIGLYCERIDES: Triglycerides: 73 mg/dL (ref <150)

## 2019-09-02 LAB — LACTIC ACID, PLASMA: Lactic Acid, Venous: 1.7 mmol/L (ref 0.5–1.9)

## 2019-09-02 LAB — PROCALCITONIN: Procalcitonin: 0.31 ng/mL

## 2019-09-02 LAB — D-DIMER, QUANTITATIVE: D-Dimer, Quant: 1.78 ug/mL-FEU — ABNORMAL HIGH (ref 0.00–0.50)

## 2019-09-02 LAB — FIBRINOGEN: Fibrinogen: >800 mg/dL — ABNORMAL HIGH (ref 210–475)

## 2019-09-02 MED ORDER — SODIUM CHLORIDE 0.9 % IV BOLUS
500.0000 mL | Freq: Once | INTRAVENOUS | Status: AC
  Administered 2019-09-02: 23:00:00 500 mL via INTRAVENOUS

## 2019-09-02 MED ORDER — SODIUM CHLORIDE 0.9 % IV BOLUS
500.0000 mL | Freq: Once | INTRAVENOUS | Status: AC
  Administered 2019-09-02: 500 mL via INTRAVENOUS

## 2019-09-02 MED ORDER — DILTIAZEM HCL-DEXTROSE 125-5 MG/125ML-% IV SOLN (PREMIX)
5.0000 mg/h | INTRAVENOUS | Status: DC
  Administered 2019-09-02: 20:00:00 5 mg/h via INTRAVENOUS
  Filled 2019-09-02: qty 125

## 2019-09-02 MED ORDER — RIVAROXABAN 15 MG PO TABS
15.0000 mg | ORAL_TABLET | Freq: Every day | ORAL | Status: DC
  Administered 2019-09-03 – 2019-09-05 (×3): 15 mg via ORAL
  Filled 2019-09-02 (×3): qty 1

## 2019-09-02 MED ORDER — DEXAMETHASONE SODIUM PHOSPHATE 10 MG/ML IJ SOLN
6.0000 mg | Freq: Once | INTRAMUSCULAR | Status: AC
  Administered 2019-09-02: 23:00:00 6 mg via INTRAVENOUS
  Filled 2019-09-02: qty 1

## 2019-09-02 MED ORDER — SODIUM CHLORIDE 0.9 % IV SOLN
100.0000 mg | Freq: Every day | INTRAVENOUS | Status: AC
  Administered 2019-09-03 – 2019-09-06 (×4): 100 mg via INTRAVENOUS
  Filled 2019-09-02 (×2): qty 20
  Filled 2019-09-02: qty 100
  Filled 2019-09-02: qty 20

## 2019-09-02 MED ORDER — POLYETHYLENE GLYCOL 3350 17 G PO PACK: 17.0000 g | PACK | Freq: Every day | ORAL | Status: DC | PRN

## 2019-09-02 MED ORDER — TOCILIZUMAB 400 MG/20ML IV SOLN
8.0000 mg/kg | Freq: Once | INTRAVENOUS | Status: AC
  Administered 2019-09-03: 486 mg via INTRAVENOUS
  Filled 2019-09-02: qty 24.3

## 2019-09-02 MED ORDER — ACETAMINOPHEN 325 MG PO TABS: 650.0000 mg | ORAL_TABLET | Freq: Four times a day (QID) | ORAL | Status: DC | PRN

## 2019-09-02 MED ORDER — ALBUTEROL SULFATE HFA 108 (90 BASE) MCG/ACT IN AERS
2.0000 | INHALATION_SPRAY | Freq: Four times a day (QID) | RESPIRATORY_TRACT | Status: DC
  Administered 2019-09-02 – 2019-09-03 (×5): 2 via RESPIRATORY_TRACT
  Filled 2019-09-02 (×3): qty 6.7

## 2019-09-02 MED ORDER — DEXAMETHASONE 4 MG PO TABS
6.0000 mg | ORAL_TABLET | ORAL | Status: DC
  Administered 2019-09-02 – 2019-09-06 (×5): 6 mg via ORAL
  Filled 2019-09-02 (×4): qty 1
  Filled 2019-09-02: qty 2

## 2019-09-02 MED ORDER — LEVOTHYROXINE SODIUM 100 MCG PO TABS
100.0000 ug | ORAL_TABLET | Freq: Every day | ORAL | Status: DC
  Administered 2019-09-03 – 2019-09-07 (×5): 100 ug via ORAL
  Filled 2019-09-02 (×5): qty 1

## 2019-09-02 MED ORDER — INSULIN DETEMIR 100 UNIT/ML ~~LOC~~ SOLN
24.0000 [IU] | Freq: Every day | SUBCUTANEOUS | Status: DC
  Administered 2019-09-03 – 2019-09-07 (×5): 24 [IU] via SUBCUTANEOUS
  Filled 2019-09-02 (×5): qty 0.24

## 2019-09-02 MED ORDER — MAGIC MOUTHWASH
5.0000 mL | Freq: Two times a day (BID) | ORAL | Status: DC
  Administered 2019-09-03 – 2019-09-07 (×7): 5 mL via ORAL
  Filled 2019-09-02 (×11): qty 5

## 2019-09-02 MED ORDER — DOCUSATE SODIUM 50 MG/5ML PO LIQD
100.0000 mg | Freq: Two times a day (BID) | ORAL | Status: DC
  Administered 2019-09-04 – 2019-09-07 (×7): 100 mg via ORAL
  Filled 2019-09-02 (×10): qty 10

## 2019-09-02 MED ORDER — ASCORBIC ACID 500 MG PO TABS
500.0000 mg | ORAL_TABLET | Freq: Every day | ORAL | Status: DC
  Administered 2019-09-02 – 2019-09-07 (×6): 500 mg via ORAL
  Filled 2019-09-02 (×6): qty 1

## 2019-09-02 MED ORDER — SODIUM CHLORIDE 0.9 % IV SOLN
200.0000 mg | Freq: Once | INTRAVENOUS | Status: AC
  Administered 2019-09-03: 200 mg via INTRAVENOUS
  Filled 2019-09-02: qty 200

## 2019-09-02 MED ORDER — PANTOPRAZOLE SODIUM 40 MG PO TBEC
40.0000 mg | DELAYED_RELEASE_TABLET | Freq: Two times a day (BID) | ORAL | Status: DC
  Administered 2019-09-03 – 2019-09-07 (×9): 40 mg via ORAL
  Filled 2019-09-02 (×9): qty 1

## 2019-09-02 MED ORDER — DILTIAZEM HCL ER COATED BEADS 240 MG PO CP24: 240.0000 mg | ORAL_CAPSULE | Freq: Every day | ORAL | Status: DC

## 2019-09-02 MED ORDER — INSULIN ASPART 100 UNIT/ML ~~LOC~~ SOLN
0.0000 [IU] | Freq: Three times a day (TID) | SUBCUTANEOUS | Status: DC
  Administered 2019-09-03 (×2): 11 [IU] via SUBCUTANEOUS
  Administered 2019-09-03: 2 [IU] via SUBCUTANEOUS
  Administered 2019-09-04: 5 [IU] via SUBCUTANEOUS
  Administered 2019-09-04: 17:00:00 2 [IU] via SUBCUTANEOUS
  Administered 2019-09-04: 8 [IU] via SUBCUTANEOUS
  Administered 2019-09-05: 2 [IU] via SUBCUTANEOUS
  Administered 2019-09-06: 16:00:00 3 [IU] via SUBCUTANEOUS
  Administered 2019-09-06: 5 [IU] via SUBCUTANEOUS
  Administered 2019-09-07: 09:00:00 3 [IU] via SUBCUTANEOUS
  Filled 2019-09-02: qty 0.15

## 2019-09-02 MED ORDER — ZINC SULFATE 220 (50 ZN) MG PO CAPS
220.0000 mg | ORAL_CAPSULE | Freq: Every day | ORAL | Status: DC
  Administered 2019-09-02 – 2019-09-07 (×6): 220 mg via ORAL
  Filled 2019-09-02 (×6): qty 1

## 2019-09-02 NOTE — H&P (Addendum)
[facility] History and Physical  Joanna Hill [id number] DOB: 06/24/1932 DOA: 09/09/2019  Referring physician: Quintella Reichert, MD PCP: Seward Carol, MD   Chief Complaint: Fever, cough, low O2  HPI: Joanna Hill is a 86 y.o. female with PMH of A fib RVR (on Xarelto), DM, CVA and dementia who presents to ER as transfer from nursing home [facility] due to low O2 sats and febrile state. She was diagnosed with COVID ~ 1 weeks ago at nursing home and had been asymptomatic until today. Patient unable to give much history. Reports feeling well. Denies problems eating or drinking. Denies fever, chills, cough, SOB, chest pain, abdominal pain, nausea, vomiting, diarrhea, constipation, dysuria, hematuria, hematochezia, melena, difficulty moving arms/legs, speech difficulty, trouble eating or any other complaints. However, patient febrile in ED and requiring O2.  Spoke with daughter-in-law [name]: She reports that patient was screened for COVID ~ one week ago due to exposure in nursing home and found to be COVID positive. CXR at that time showed pneumonia but patient had been asymptomatic until today. Nursing home reported fever and O2 sats around 75% on RA. Also reported altered mental status. [name] reports speaking with her earlier today and that she was a little confused but overall sounded okay.   ED Course: Patient arrived from nursing home for evaluation of AMS, low O2 sats and fever. Noted to have new O2 requirement to 3L and in A fib with RVR. She had previously been given 500 mL IVF by EMS. She was given another 500 mL IVF in ER and ER provider noted they were going to order another 500 mL due to hypotension. She was started on Cardizem which was stopped just prior to admission due to regulation of HR. Patient fevrile to 104.1 F. Initial RR 25 with HR into 120's and BP 103/54. HR improved with Cardizem and BP soft to 80-90's SBP and 50's DBP. CXR showing multifocal PNA. EKG as below. Labs  remarkable for normal lactate. Blood cx drawn. CBC WNL. CMP with glucose 257 and Cr 1.23. D-dimer 1.78. Procal 0.31. LDH and ferritin WNL. Fibrinogen > 800. CRP 24.6. INR 4.4.  Review of Systems:  A comprehensive ROS was obtained and negative unless noted above in HPI. Of note, patient with altered mental status and unable to provide complete ROS. Per daughter-in-law, ROS positive for fever, SOB and confusion only.   Past Medical History:  Diagnosis Date  . Atrial fibrillation with RVR ([place]) 02/28/2017  . Blindness   . Cancer ([place])    breast  . Carotid artery occlusion   . Dementia ([place])   . Diabetes mellitus   . GERD (gastroesophageal reflux disease)   . Hyperlipidemia   . Hypertension   . Hypothyroidism   . Knee injury 2018  . Permanent atrial fibrillation ([place]) 06/30/2017  . Stroke [facility]) 2009 and  2011   difficulty with swallowing   Past Surgical History:  Procedure Laterality Date  . ABDOMINAL HYSTERECTOMY    . Arch Aortogram  03-15-2010  . MASTECTOMY, PARTIAL Right   . ROTATOR CUFF REPAIR Left 1981   Social History:  reports that she has never smoked. She has quit using smokeless tobacco.  Her smokeless tobacco use included snuff. She reports that she does not drink alcohol or use drugs.  No Known Allergies  Family History  Problem Relation Age of Onset  . Cancer Mother   . Heart disease Father        Heart disease before age 46  .  Asthma Sister   . Diabetes Sister   . Breast cancer Sister   . Diabetes Sister   . Diabetes Sister   . Diabetes Sister   . Diabetes Sister   . Diabetes Sister   . Diabetes Sister   . Diabetes Sister   . Diabetes Sister    Prior to Admission medications   Medication Sig Start Date End Date Taking? Authorizing Provider  acetaminophen (TYLENOL) 325 MG tablet Take 650 mg by mouth every 6 (six) hours as needed for mild pain, moderate pain, fever or headache.     [provider]  antiseptic oral rinse (BIOTENE) LIQD 5 mLs by  Mouth Rinse route 2 (two) times daily.    [provider]  Bepotastine Besilate (BEPREVE) 1.5 % SOLN Place 1 drop into both eyes daily.    [provider]  cetirizine (ZYRTEC) 10 MG tablet Take 1 tablet (10 mg total) by mouth 2 (two) times daily. Patient taking differently: Take 10 mg by mouth daily with breakfast.  05/31/17   Kozlow, Donnamarie Poag, MD  cyproheptadine (PERIACTIN) 4 MG tablet Take one tablet twice daily 07/12/16   Kozlow, Donnamarie Poag, MD  diclofenac sodium (VOLTAREN) 1 % GEL Apply 2 g topically as needed (for right hip and thigh pain). May use gel to right hip and thigh as needed for pain.     [provider]  diltiazem (CARDIZEM CD) 240 MG 24 hr capsule Take 240 mg by mouth daily with breakfast.  03/15/17   Ahmed Prima, Fransisco Hertz, PA-C  diphenhydrAMINE (BENADRYL) 25 mg capsule Take 25 mg by mouth every 6 (six) hours as needed for itching.     [provider]  divalproex (DEPAKOTE) 125 MG DR tablet Take 125 mg by mouth at bedtime.    [provider]  docusate (COLACE) 50 MG/5ML liquid Take 100 mg by mouth 2 (two) times daily.    [provider]  donepezil (ARICEPT) 10 MG tablet Take 10 mg by mouth at bedtime.    [provider]  Eyelid Cleansers (OCUSOFT LID SCRUB) PADS Apply 1 each topically 2 (two) times daily.    [provider]  fluticasone (FLONASE) 50 MCG/ACT nasal spray Place 1 spray into both nostrils 2 (two) times daily.     [provider]  Fluvoxamine Maleate (LUVOX CR) 150 MG CP24 Take 150 mg by mouth at bedtime.    [provider]  furosemide (LASIX) 40 MG tablet Take 1 tablet (40 mg total) daily by mouth. Patient taking differently: Take 40 mg by mouth daily with breakfast.  06/23/17   Minus Breeding, MD  guaiFENesin (MUCINEX) 600 MG 12 hr tablet Take 600 mg by mouth 2 (two) times daily.    [provider]  Cleda Clarks 100 UNIT/ML Mayer Masker  10/09/17   [provider]    hydrocortisone (CORTEF) 20 MG tablet  10/20/17   [provider]  hydroxypropyl methylcellulose / hypromellose (ISOPTO TEARS / GONIOVISC) 2.5 % ophthalmic solution Place 1 drop into both eyes 4 (four) times daily.    [provider]  insulin aspart (NOVOLOG) 100 UNIT/ML injection Inject 2-11 Units into the skin 4 (four) times daily. BL. MOD. ACCUCHECK WITH S/S USE NOVOLOG INSULIN  101-150 = 2U 151-200= 3U 201-250 = 5U 251-300 = 7U 301-350 = 9U >350 = 11U CALL MD FOR BS.400 OR < 60 Taken at 6am 11:30 am 14:30 and 21:00    [provider]  ketotifen (ZADITOR) 0.025 % ophthalmic solution  Place 1 drop into both eyes 2 (two) times daily.     [provider]  levalbuterol Penne Lash) 0.63 MG/3ML nebulizer solution  10/12/17   [provider]  LEVEMIR FLEXTOUCH 100 UNIT/ML Pen Inject 24 Units into the skin daily at 10 pm. 03/02/17   Rosita Fire, MD  levothyroxine (SYNTHROID, LEVOTHROID) 100 MCG tablet Take 100 mcg by mouth daily before breakfast.    [provider]  magic mouthwash SOLN Take 5 mLs by mouth 2 (two) times daily.    [provider]  mirtazapine (REMERON) 15 MG tablet Take 15 mg by mouth at bedtime.    [provider]  neomycin-polymyxin-dexameth (MAXITROL) 0.1 % OINT Place 1 application into both eyes at bedtime.    [provider]  nitroGLYCERIN (NITROSTAT) 0.4 MG SL tablet Place 0.4 mg every 5 (five) minutes as needed under the tongue for chest pain.  04/25/17   [provider]  pantoprazole (PROTONIX) 40 MG tablet Take 1 tablet (40 mg total) by mouth 2 (two) times daily before a meal. 10/12/17   Eliseo Squires, Tomi Bamberger, DO  Polyethyl Glycol-Propyl Glycol (SYSTANE) 0.4-0.3 % GEL ophthalmic gel Place 1 application into both eyes at bedtime.    [provider]  polyethylene glycol (MIRALAX / GLYCOLAX) packet Take 17 g by mouth daily as needed. Patient taking differently: Take 17 g by mouth  daily as needed for mild constipation.  03/02/17   Rosita Fire, MD  potassium chloride SA (K-DUR,KLOR-CON) 20 MEQ tablet Take 20 mEq by mouth daily with breakfast.  04/25/17   [provider]  ranitidine (ZANTAC) 150 MG tablet Take 150 mg by mouth 2 (two) times daily.    [provider]  Rivaroxaban (XARELTO) 15 MG TABS tablet Take 15 mg by mouth daily with supper.    [provider]  Saline 0.65 % (Soln) SOLN Place 1 spray into both nostrils every 2 (two) hours as needed (for dry nares).     [provider]  sertraline (ZOLOFT) 50 MG tablet  12/22/17   [provider]  Soft Lens Products (REFRESH CONTACTS DROPS) SOLN Place 1 drop into both eyes 4 (four) times daily.    [provider]  sucralfate (CARAFATE) 1 g tablet Take 1 g by mouth 4 (four) times daily.  01/20/16   [provider]  traZODone (DESYREL) 50 MG tablet Take 25 mg at bedtime by mouth.  05/24/17   [provider]  triamcinolone cream (KENALOG) 0.5 % Apply 1 application topically 2 (two) times daily. Apply to legs for itching/rash 10/14/16   [provider]  white petrolatum (VASELINE) GEL Apply 1 application topically at bedtime.    [provider]   Physical Exam: Vitals:   08/18/2019 2100 09/10/2019 2130 09/10/2019 2200 08/24/2019 2215  BP: (!) 86/50 (!) 91/50    Pulse: 95 91    Resp: 16 18    Temp:    99.7 F (37.6 C)  TempSrc:    Rectal  SpO2: 97% 97%    Weight:   60.8 kg     Wt Readings from Last 3 Encounters:  08/31/2019 60.8 kg  10/12/17 60.8 kg  08/11/17 65.3 kg    General:  Appears calm and comfortable Eyes: EOMI, normal lids, irises & conjunctiva ENT: grossly normal hearing Neck: no LAD Cardiovascular: Irregularly irregular rhythm, no m/r/g. No LE edema. Telemetry: Known Afib Respiratory: Normal respiratory effort. Decreased breath sounds bilaterally. Abdomen: soft, ntnd Skin: no rash or  induration seen on limited  exam Musculoskeletal: grossly normal tone BUE/BLE Psychiatric: grossly normal mood and affect, speech fluent and appropriate Neurologic: grossly non-focal. Patient is oriented to person, place and year only. Did not know month, day or exactly why she is here.          Labs on Admission:  Basic Metabolic Panel: Recent Labs  Lab 08/23/2019 1956  NA 138  K 3.7  CL 104  CO2 23  GLUCOSE 257*  BUN 28*  CREATININE 1.23*  CALCIUM 8.6*   Liver Function Tests: Recent Labs  Lab 08/17/2019 1956  AST 22  ALT 12  ALKPHOS 84  BILITOT 0.9  PROT 6.7  ALBUMIN 3.2*   No results for input(s): LIPASE, AMYLASE in the last 168 hours. No results for input(s): AMMONIA in the last 168 hours. CBC: Recent Labs  Lab 09/09/2019 1956  WBC 8.9  NEUTROABS 8.1*  HGB 11.6*  HCT 36.1  MCV 88.5  PLT 187   Cardiac Enzymes: No results for input(s): CKTOTAL, CKMB, CKMBINDEX, TROPONINI in the last 168 hours.  BNP (last 3 results) No results for input(s): BNP in the last 8760 hours.  ProBNP (last 3 results) No results for input(s): PROBNP in the last 8760 hours.  CBG: Recent Labs  Lab 09/13/2019 1954  GLUCAP 216*    Radiological Exams on Admission: DG Chest Port 1 View  Result Date: 09/06/2019 CLINICAL DATA:  86 year old female with fever.  Positive COVID-19. EXAM: PORTABLE CHEST 1 VIEW COMPARISON:  Chest radiograph dated 10/10/2017. FINDINGS: Faint bilateral peripheral and subpleural confluent densities most consistent with multifocal pneumonia, likely viral or atypical in etiology including COVID-19. Clinical correlation is recommended. There is no pleural effusion or pneumothorax. Stable cardiac silhouette. No acute osseous pathology. IMPRESSION: Multifocal pneumonia, likely viral or atypical in etiology. Clinical correlation and follow-up to resolution recommended. Electronically Signed   By: Anner Crete M.D.   On: 08/26/2019 19:31    EKG: Independently reviewed. A fib with RVR to HR 120's.  QTc 437. No STEMI.  Assessment/Plan Principal Problem:   COVID-19 virus infection Active Problems:   Essential hypertension   HYPOTENSION   Altered mental status   Atrial fibrillation with RVR ([facility])   Hypothyroidism   Dementia ([facility])   Constipation   Diabetes mellitus type 2, insulin dependent ([facility])   Current use of long term anticoagulation   Multifocal pneumonia   CKD (chronic kidney disease) stage 3, GFR 30-59 ml/min   1. COVID-19 infection with acute encephalopathy and Multifocal PNA Acute hypoxic respiratory failure due to Covid-19: Patient presents with fever. O2 saturations into 80's on RA now requiring 3L. Chest x-ray showing multifocal PNA. -Admit to a telemetry bed in progressive unit -COVID-19 admission order set utilized -Continuous pulse oximetry with nasal cannula oxygen as needed to maintain O2 saturation greater than 90% -Follow-up blood cultures -Albuterol inhaler  -Continue Decadron 6 mg p.o. daily -Remdesivir per pharmacy -Vitamin C and zinc  -Actemra ordered due to elevated CRP, Procal, LDH and Fibrinogen with CXR with multifocal PNA, likely viral. No evidence of bacterial infection at this time. Patient with normal liver function. D/w [name] who is in agreement with treatment plan. We reviewed patient's hx for appropriateness and safety. LFT's WNL. -CTA not ordered as patient on chronic AC  2. Atrial Fibrillation on chronic AC with Hypotension -Presented in Afib with RVR and on Cardizem drip in ED which was stopped just prior to admission due to regulation of HR -Patient with soft BP's and  was given a total of 1000 mL IVF with another 500 mL ordered by ED provider prior to admission and not yet given -High risk of decompensation -Cont PTA Xarelto and Cardizem  3. Type 2 DM -Last A1c 10.6 in July 2018 -PTA meds: Levemir 24 u with sliding scaled which was continued on admission -May need to increase regimen as patient will also be receiving steroids -Repeat  A1c ordered; likely goal < 8.0 due to age and co-morbidities  4. Hx of dementia, CVA -Attempted to call nursing home to confirm medications as PTA list with multiple meds for mood/sleep/dementia; they did not answer -Daughter-in-law unaware of which medications she is on but reports she was not on any meds for those things prior to nursing home placement and she did not think she needs many of them -In agreement with above and therefore not ordered, should patient have difficulty sleeping could add back Mirtazapine or Trazodone  5. CKD state 3 -Cr stable over last year  -daily monitoring  6. Constipation/GERD -PTA meds continued  7. Hypothyroidism -PTA Synthroid continued -last TSH elevated in 2018 without repeat; repeat ordered although could be altered due to acute infection  Code Status: Full Code; discussed in detail with daughter-in-law, [name]. She will also speak with her husband (patient's son) and should they change their mind they will let us know.  DVT Prophylaxis: PTA Xarelto Family Communication: Shandra Ozbirn (daughter-in-law); would like daily updates Disposition Plan: Admit to Progressive Unit. Patient is at high risk for further decompensation due to age and co-morbidities requiring IV drips and possibly intubation. Currently requiring O2 and required Cardizem drip in ED.   Time spent: > 60 minutes   [facility] Pager [phone]

## 2019-09-02 NOTE — ED Provider Notes (Signed)
[facility] DEPT Provider Note   CSN: [id number] Arrival date & time: 08/23/2019  1837     History Chief Complaint  Patient presents with  . Covid Positve  . Fever    Joanna Hill is a 86 y.o. female.  The history is provided by the patient, the EMS personnel and medical records. No language interpreter was used.  Fever  Joanna Hill is a 86 y.o. female who presents to the Emergency Department complaining of COVID positive and fever. Level V caveat due to confusion. She presents the emergency department from [facility] for evaluation of fever and altered mental status. She was diagnosed with COVID about one week ago. They report that she is more confused in her baseline. EMS provided 1 g of Tylenol and 500 mL of fluids. She does have a history of atrial fibrillation and does take Xarelto. On ED arrival patient is unsure why she is here but states that she does not feel well. She denies any shortness of breath, Donald pain, nausea, vomiting.    Past Medical History:  Diagnosis Date  . Atrial fibrillation with RVR ([place]) 02/28/2017  . Blindness   . Cancer ([place])    breast  . Carotid artery occlusion   . Dementia ([place])   . Diabetes mellitus   . GERD (gastroesophageal reflux disease)   . Hyperlipidemia   . Hypertension   . Hypothyroidism   . Knee injury 2018  . Permanent atrial fibrillation ([place]) 06/30/2017  . Stroke [facility]) 2009 and  2011   difficulty with swallowing    Patient Active Problem List   Diagnosis Date Noted  . Chronic diastolic CHF (congestive heart failure) ([place]) 10/11/2017  . Chest pain 10/10/2017  . Acute on chronic diastolic heart failure ([place]) 06/30/2017  . Permanent atrial fibrillation ([place]) 06/30/2017  . Leg swelling 06/23/2017  . Paroxysmal atrial fibrillation ([place]) 03/15/2017  . Current use of long term anticoagulation 03/15/2017  . Atrial fibrillation with RVR ([place]) 02/28/2017  . Hypothyroidism 02/28/2017  .  Hypertension 02/28/2017  . History of CVA in adulthood 02/28/2017  . Dementia ([place]) 02/28/2017  . Constipation 02/28/2017  . GERD (gastroesophageal reflux disease) 02/28/2017  . Diabetes mellitus type 2, insulin dependent ([place]) 02/28/2017  . Hyperkalemia 01/18/2014  . Carotid artery disease ([place]) 09/28/2011  . HYPOTENSION 08/05/2008  . DYSPHAGIA 08/05/2008  . ALTERED MENTAL STATUS 08/01/2008  . HYPERLIPIDEMIA 09/25/2007  . Essential hypertension 09/25/2007  . CORONARY ARTERY DISEASE 09/25/2007    Past Surgical History:  Procedure Laterality Date  . ABDOMINAL HYSTERECTOMY    . Arch Aortogram  03-15-2010  . MASTECTOMY, PARTIAL Right   . ROTATOR CUFF REPAIR Left 1981     OB History   No obstetric history on file.     Family History  Problem Relation Age of Onset  . Cancer Mother   . Heart disease Father        Heart disease before age 26  . Asthma Sister   . Diabetes Sister   . Breast cancer Sister   . Diabetes Sister   . Diabetes Sister   . Diabetes Sister   . Diabetes Sister   . Diabetes Sister   . Diabetes Sister   . Diabetes Sister   . Diabetes Sister     Social History   Tobacco Use  . Smoking status: Never Smoker  . Smokeless tobacco: Former Systems developer    Types: Snuff  Substance Use Topics  . Alcohol use: No  .  Drug use: No    Home Medications Prior to Admission medications   Medication Sig Start Date End Date Taking? Authorizing Provider  acetaminophen (TYLENOL) 325 MG tablet Take 650 mg by mouth every 6 (six) hours as needed for mild pain, moderate pain, fever or headache.     [provider]  antiseptic oral rinse (BIOTENE) LIQD 5 mLs by Mouth Rinse route 2 (two) times daily.    [provider]  Bepotastine Besilate (BEPREVE) 1.5 % SOLN Place 1 drop into both eyes daily.    [provider]  cetirizine (ZYRTEC) 10 MG tablet Take 1 tablet (10 mg total) by mouth 2 (two) times daily. Patient taking differently: Take 10 mg by  mouth daily with breakfast.  05/31/17   Kozlow, Donnamarie Poag, MD  cyproheptadine (PERIACTIN) 4 MG tablet Take one tablet twice daily 07/12/16   Kozlow, Donnamarie Poag, MD  diclofenac sodium (VOLTAREN) 1 % GEL Apply 2 g topically as needed (for right hip and thigh pain). May use gel to right hip and thigh as needed for pain.     [provider]  diltiazem (CARDIZEM CD) 240 MG 24 hr capsule Take 240 mg by mouth daily with breakfast.  03/15/17   Ahmed Prima, Fransisco Hertz, PA-C  diphenhydrAMINE (BENADRYL) 25 mg capsule Take 25 mg by mouth every 6 (six) hours as needed for itching.     [provider]  divalproex (DEPAKOTE) 125 MG DR tablet Take 125 mg by mouth at bedtime.    [provider]  docusate (COLACE) 50 MG/5ML liquid Take 100 mg by mouth 2 (two) times daily.    [provider]  donepezil (ARICEPT) 10 MG tablet Take 10 mg by mouth at bedtime.    [provider]  Eyelid Cleansers (OCUSOFT LID SCRUB) PADS Apply 1 each topically 2 (two) times daily.    [provider]  fluticasone (FLONASE) 50 MCG/ACT nasal spray Place 1 spray into both nostrils 2 (two) times daily.     [provider]  Fluvoxamine Maleate (LUVOX CR) 150 MG CP24 Take 150 mg by mouth at bedtime.    [provider]  furosemide (LASIX) 40 MG tablet Take 1 tablet (40 mg total) daily by mouth. Patient taking differently: Take 40 mg by mouth daily with breakfast.  06/23/17   Minus Breeding, MD  guaiFENesin (MUCINEX) 600 MG 12 hr tablet Take 600 mg by mouth 2 (two) times daily.    [provider]  Cleda Clarks 100 UNIT/ML Mayer Masker  10/09/17   [provider]  hydrocortisone (CORTEF) 20 MG tablet  10/20/17   [provider]  hydroxypropyl methylcellulose / hypromellose (ISOPTO TEARS / GONIOVISC) 2.5 % ophthalmic solution Place 1 drop into both eyes 4 (four) times daily.    [provider]  insulin aspart (NOVOLOG) 100 UNIT/ML injection Inject 2-11 Units  into the skin 4 (four) times daily. BL. MOD. ACCUCHECK WITH S/S USE NOVOLOG INSULIN  101-150 = 2U 151-200= 3U 201-250 = 5U 251-300 = 7U 301-350 = 9U >350 = 11U CALL MD FOR BS.400 OR < 60 Taken at 6am 11:30 am 14:30 and 21:00    [provider]  ketotifen (ZADITOR) 0.025 % ophthalmic solution Place 1 drop into both eyes 2 (two) times daily.     [provider]  levalbuterol Penne Lash) 0.63 MG/3ML nebulizer solution  10/12/17   [provider]  LEVEMIR FLEXTOUCH 100 UNIT/ML Pen Inject 24 Units into the skin daily at 10 pm. 03/02/17  Rosita Fire, MD  levothyroxine (SYNTHROID, LEVOTHROID) 100 MCG tablet Take 100 mcg by mouth daily before breakfast.    [provider]  magic mouthwash SOLN Take 5 mLs by mouth 2 (two) times daily.    [provider]  mirtazapine (REMERON) 15 MG tablet Take 15 mg by mouth at bedtime.    [provider]  neomycin-polymyxin-dexameth (MAXITROL) 0.1 % OINT Place 1 application into both eyes at bedtime.    [provider]  nitroGLYCERIN (NITROSTAT) 0.4 MG SL tablet Place 0.4 mg every 5 (five) minutes as needed under the tongue for chest pain.  04/25/17   [provider]  pantoprazole (PROTONIX) 40 MG tablet Take 1 tablet (40 mg total) by mouth 2 (two) times daily before a meal. 10/12/17   Eliseo Squires, Tomi Bamberger, DO  Polyethyl Glycol-Propyl Glycol (SYSTANE) 0.4-0.3 % GEL ophthalmic gel Place 1 application into both eyes at bedtime.    [provider]  polyethylene glycol (MIRALAX / GLYCOLAX) packet Take 17 g by mouth daily as needed. Patient taking differently: Take 17 g by mouth daily as needed for mild constipation.  03/02/17   Rosita Fire, MD  potassium chloride SA (K-DUR,KLOR-CON) 20 MEQ tablet Take 20 mEq by mouth daily with breakfast.  04/25/17   [provider]  ranitidine (ZANTAC) 150 MG tablet Take 150 mg by mouth 2 (two) times daily.    [provider]    Rivaroxaban (XARELTO) 15 MG TABS tablet Take 15 mg by mouth daily with supper.    [provider]  Saline 0.65 % (Soln) SOLN Place 1 spray into both nostrils every 2 (two) hours as needed (for dry nares).     [provider]  sertraline (ZOLOFT) 50 MG tablet  12/22/17   [provider]  Soft Lens Products (REFRESH CONTACTS DROPS) SOLN Place 1 drop into both eyes 4 (four) times daily.    [provider]  sucralfate (CARAFATE) 1 g tablet Take 1 g by mouth 4 (four) times daily.  01/20/16   [provider]  traZODone (DESYREL) 50 MG tablet Take 25 mg at bedtime by mouth.  05/24/17   [provider]  triamcinolone cream (KENALOG) 0.5 % Apply 1 application topically 2 (two) times daily. Apply to legs for itching/rash 10/14/16   [provider]  white petrolatum (VASELINE) GEL Apply 1 application topically at bedtime.    [provider]    Allergies    Patient has no known allergies.  Review of Systems   Review of Systems  Constitutional: Positive for fever.  All other systems reviewed and are negative.   Physical Exam Updated Vital Signs BP (!) 92/57 (BP Location: Left Arm)   Pulse (!) 109   Temp (!) 104.1 F (40.1 C) (Oral)   Resp (!) 23   SpO2 96%   Physical Exam Vitals and nursing note reviewed.  Constitutional:      Appearance: She is well-developed.  HENT:     Head: Normocephalic and atraumatic.  Cardiovascular:     Rate and Rhythm: Tachycardia present. Rhythm irregular.     Heart sounds: No murmur.  Pulmonary:     Effort: Pulmonary effort is normal. No respiratory distress.  Abdominal:     Palpations: Abdomen is soft.     Tenderness: There is no guarding or rebound.     Comments: Mild to moderate generalized abdominal tenderness  Musculoskeletal:        General: No tenderness.  Comments: One plus edema to the right upper extremity  Skin:    General: Skin is warm and dry.  Neurological:     Mental  Status: She is alert.     Comments: Oriented to person and place. Disoriented to recent events in time. Generalized weakness.  Psychiatric:        Behavior: Behavior normal.     ED Results / Procedures / Treatments   Labs (all labs ordered are listed, but only abnormal results are displayed) Labs Reviewed  CBC WITH DIFFERENTIAL/PLATELET - Abnormal; Notable for the following components:      Result Value   Hemoglobin 11.6 (*)    Neutro Abs 8.1 (*)    Lymphs Abs 0.4 (*)    All other components within normal limits  COMPREHENSIVE METABOLIC PANEL - Abnormal; Notable for the following components:   Glucose, Bld 257 (*)    BUN 28 (*)    Creatinine, Ser 1.23 (*)    Calcium 8.6 (*)    Albumin 3.2 (*)    GFR calc non Af Amer 40 (*)    GFR calc Af Amer 46 (*)    All other components within normal limits  D-DIMER, QUANTITATIVE (NOT AT [facility]) - Abnormal; Notable for the following components:   D-Dimer, Quant 1.78 (*)    All other components within normal limits  FIBRINOGEN - Abnormal; Notable for the following components:   Fibrinogen >800 (*)    All other components within normal limits  C-REACTIVE PROTEIN - Abnormal; Notable for the following components:   CRP 24.6 (*)    All other components within normal limits  PROTIME-INR - Abnormal; Notable for the following components:   Prothrombin Time 42.1 (*)    INR 4.4 (*)    All other components within normal limits  CBG MONITORING, ED - Abnormal; Notable for the following components:   Glucose-Capillary 216 (*)    All other components within normal limits  CULTURE, BLOOD (ROUTINE X 2)  CULTURE, BLOOD (ROUTINE X 2)  LACTIC ACID, PLASMA  LACTATE DEHYDROGENASE  FERRITIN  TRIGLYCERIDES  LACTIC ACID, PLASMA  PROCALCITONIN    EKG EKG Interpretation  Date/Time:  Monday September 02 2019 19:01:28 EST Ventricular Rate:  124 PR Interval:    QRS Duration: 82 QT Interval:  304 QTC Calculation: 437 R Axis:   55 Text  Interpretation: Atrial fibrillation Low voltage, extremity and precordial leads Borderline repolarization abnormality Confirmed by Quintella Reichert [phone]) on 08/17/2019 7:17:58 PM   Radiology DG Chest Port 1 View  Result Date: 08/24/2019 CLINICAL DATA:  86 year old female with fever.  Positive COVID-19. EXAM: PORTABLE CHEST 1 VIEW COMPARISON:  Chest radiograph dated 10/10/2017. FINDINGS: Faint bilateral peripheral and subpleural confluent densities most consistent with multifocal pneumonia, likely viral or atypical in etiology including COVID-19. Clinical correlation is recommended. There is no pleural effusion or pneumothorax. Stable cardiac silhouette. No acute osseous pathology. IMPRESSION: Multifocal pneumonia, likely viral or atypical in etiology. Clinical correlation and follow-up to resolution recommended. Electronically Signed   By: Anner Crete M.D.   On: 08/20/2019 19:31    Procedures Procedures (including critical care time) CRITICAL CARE Performed by: Quintella Reichert   Total critical care time: 45 minutes  Critical care time was exclusive of separately billable procedures and treating other patients.  Critical care was necessary to treat or prevent imminent or life-threatening deterioration.  Critical care was time spent personally by me on the following activities: development of treatment plan with patient and/or surrogate as well  as nursing, discussions with consultants, evaluation of patient's response to treatment, examination of patient, obtaining history from patient or surrogate, ordering and performing treatments and interventions, ordering and review of laboratory studies, ordering and review of radiographic studies, pulse oximetry and re-evaluation of patient's condition.  Medications Ordered in ED Medications  diltiazem (CARDIZEM) 125 mg in dextrose 5% 125 mL (1 mg/mL) infusion (0 mg/hr Intravenous Stopped 08/26/2019 2111)  dexamethasone (DECADRON) injection 6 mg (has  no administration in time range)  sodium chloride 0.9 % bolus 500 mL (has no administration in time range)  sodium chloride 0.9 % bolus 500 mL (500 mLs Intravenous New Bag/Given 08/21/2019 2009)    ED Course  I have reviewed the triage vital signs and the nursing notes.  Pertinent labs & imaging results that were available during my care of the patient were reviewed by me and considered in my medical decision making (see chart for details).    MDM Rules/Calculators/A&P                     Patient from nursing facility with diagnosed COVID-19 infection here for evaluation of weakness, altered mental status. Patient is awake and alert on evaluation the emergency department for very ill appearing. She does have new oxygen requirement and is in a fib with RVR. She was treated with gentle IV fluid hydration, diltiazem drip for rate control, Decadron for COVID pneumonia. On repeat assessment patient's heart rate is improved but her blood pressure did drop to 95 systolic. Diltiazem was discontinued and additional IV fluid bolus administered. Discussed with patient findings of studies and recommendation for admission and she is in agreement with treatment plan. Attempted to contact patient's son at provided mobile phone number with no answer. Hospitalist consulted for admission for further treatment.  LEYA PLYBON was evaluated in Emergency Department on 09/01/2019 for the symptoms described in the history of present illness. She was evaluated in the context of the global COVID-19 pandemic, which necessitated consideration that the patient might be at risk for infection with the SARS-CoV-2 virus that causes COVID-19. Institutional protocols and algorithms that pertain to the evaluation of patients at risk for COVID-19 are in a state of rapid change based on information released by regulatory bodies including the CDC and federal and state organizations. These policies and algorithms were followed during the  patient's care in the ED.  Final Clinical Impression(s) / ED Diagnoses Final diagnoses:  Pneumonia due to COVID-19 virus  Atrial fibrillation with RVR [facility])    Rx / [place] Orders ED Discharge Orders    None       Quintella Reichert, MD 08/23/2019 2116

## 2019-09-02 NOTE — ED Triage Notes (Signed)
The patient presents from [facility] with complaints of fever, AFib, Covid + for 1 week and PNA. The Facility reports the patient is altered. EMS found her to be A&O x4 with Rhonchi in the lower lungs. 500 ml of fluids and 1000 mg of Tylenol was administered by EMS. HX stroke  EMS vitals: 142/65 BP 120-150 HR 37 RR 99% O2 sat on 3L 102 Temp

## 2019-09-02 NOTE — ED Notes (Signed)
Pt. Documented in error see above note in chart. 

## 2019-09-03 LAB — COMPREHENSIVE METABOLIC PANEL
ALT: 11 U/L (ref 0–44)
AST: 17 U/L (ref 15–41)
Albumin: 2.8 g/dL — ABNORMAL LOW (ref 3.5–5.0)
Alkaline Phosphatase: 80 U/L (ref 38–126)
Anion gap: 9 (ref 5–15)
BUN: 34 mg/dL — ABNORMAL HIGH (ref 8–23)
CO2: 22 mmol/L (ref 22–32)
Calcium: 8.2 mg/dL — ABNORMAL LOW (ref 8.9–10.3)
Chloride: 108 mmol/L (ref 98–111)
Creatinine, Ser: 1.23 mg/dL — ABNORMAL HIGH (ref 0.44–1.00)
GFR calc Af Amer: 46 mL/min — ABNORMAL LOW (ref >60)
GFR calc non Af Amer: 40 mL/min — ABNORMAL LOW (ref >60)
Glucose, Bld: 341 mg/dL — ABNORMAL HIGH (ref 70–99)
Potassium: 3.9 mmol/L (ref 3.5–5.1)
Sodium: 139 mmol/L (ref 135–145)
Total Bilirubin: 0.7 mg/dL (ref 0.3–1.2)
Total Protein: 6.1 g/dL — ABNORMAL LOW (ref 6.5–8.1)

## 2019-09-03 LAB — CBC WITH DIFFERENTIAL/PLATELET
Abs Immature Granulocytes: 0.07 10*3/uL (ref 0.00–0.07)
Basophils Absolute: 0 10*3/uL (ref 0.0–0.1)
Basophils Relative: 0 %
Eosinophils Absolute: 0 10*3/uL (ref 0.0–0.5)
Eosinophils Relative: 0 %
HCT: 34.1 % — ABNORMAL LOW (ref 36.0–46.0)
Hemoglobin: 10.6 g/dL — ABNORMAL LOW (ref 12.0–15.0)
Immature Granulocytes: 1 %
Lymphocytes Relative: 3 %
Lymphs Abs: 0.3 10*3/uL — ABNORMAL LOW (ref 0.7–4.0)
MCH: 28.1 pg (ref 26.0–34.0)
MCHC: 31.1 g/dL (ref 30.0–36.0)
MCV: 90.5 fL (ref 80.0–100.0)
Monocytes Absolute: 0.1 10*3/uL (ref 0.1–1.0)
Monocytes Relative: 2 %
Neutro Abs: 7.1 10*3/uL (ref 1.7–7.7)
Neutrophils Relative %: 94 %
Platelets: 157 10*3/uL (ref 150–400)
RBC: 3.77 MIL/uL — ABNORMAL LOW (ref 3.87–5.11)
RDW: 14.1 % (ref 11.5–15.5)
WBC: 7.6 10*3/uL (ref 4.0–10.5)
nRBC: 0 % (ref 0.0–0.2)

## 2019-09-03 LAB — POC SARS CORONAVIRUS 2 AG -  ED: SARS Coronavirus 2 Ag: POSITIVE — AB

## 2019-09-03 LAB — GLUCOSE, CAPILLARY
Glucose-Capillary: 131 mg/dL — ABNORMAL HIGH (ref 70–99)
Glucose-Capillary: 163 mg/dL — ABNORMAL HIGH (ref 70–99)

## 2019-09-03 LAB — CBG MONITORING, ED
Glucose-Capillary: 230 mg/dL — ABNORMAL HIGH (ref 70–99)
Glucose-Capillary: 307 mg/dL — ABNORMAL HIGH (ref 70–99)
Glucose-Capillary: 320 mg/dL — ABNORMAL HIGH (ref 70–99)

## 2019-09-03 LAB — C-REACTIVE PROTEIN: CRP: 24.7 mg/dL — ABNORMAL HIGH (ref <1.0)

## 2019-09-03 LAB — ABO/RH: ABO/RH(D): A POS

## 2019-09-03 LAB — HEMOGLOBIN A1C
Hgb A1c MFr Bld: 8.6 % — ABNORMAL HIGH (ref 4.8–5.6)
Mean Plasma Glucose: 200.12 mg/dL

## 2019-09-03 LAB — TSH: TSH: 0.619 u[IU]/mL (ref 0.350–4.500)

## 2019-09-03 LAB — MRSA PCR SCREENING: MRSA by PCR: NEGATIVE

## 2019-09-03 NOTE — Progress Notes (Addendum)
Patient trasfered from [facility] to [phone] via [place]; alert and oriented x 2; no complaints of pain; IV saline locked in LAC; skin - small pinpoint wound on sacrum with (redness non-blanchable skin around). Orient patient to room and unit; gave patient care guide; instructed how to use the call bell and  fall risk precautions. Will continue to monitor the patient.

## 2019-09-03 NOTE — ED Notes (Signed)
Pt. Documented in error see above note in chart. 

## 2019-09-03 NOTE — ED Notes (Signed)
ED TO INPATIENT HANDOFF REPORT  Name/Age/Gender Joanna Hill 86 y.o. female  Code Status    Code Status Orders  (From admission, onward)         Start     Ordered   08/28/2019 2158  Full code  Continuous     09/14/2019 2158        Code Status History    Date Active Date Inactive Code Status Order ID Comments User Context   10/11/2017 0015 10/12/2017 2058 Full Code [id number]  Rise Patience, MD ED   02/28/2017 1844 03/03/2017 0001 Full Code [id number]  Samella Parr, NP Inpatient   01/19/2014 0023 01/19/2014 1626 Full Code [id number]  Velna Hatchet, MD Inpatient   Advance Care Planning Activity      Home/SNF/Other Nursing Home  Chief Complaint COVID-19 virus infection [U07.1]  Level of Care/Admitting Diagnosis ED Disposition    ED Disposition Condition [place]: [place] [[id number]]  Level of Care: Progressive [102]  Admit to Progressive based on following criteria: CARDIOVASCULAR & THORACIC of moderate stability with acute coronary syndrome symptoms/low risk myocardial infarction/hypertensive urgency/arrhythmias/heart failure potentially compromising stability and stable post cardiovascular intervention patients.  Covid Evaluation: Confirmed COVID Positive  Diagnosis: COVID-19 virus infection [id number]  Admitting Physician: Chauncey Mann [id number]  Attending Physician: Chauncey Mann [id number]  Estimated length of stay: past midnight tomorrow  Certification:: I certify this patient will need inpatient services for at least 2 midnights       Medical History Past Medical History:  Diagnosis Date  . Atrial fibrillation with RVR ([place]) 02/28/2017  . Blindness   . Cancer ([place])    breast  . Carotid artery occlusion   . Dementia ([place])   . Diabetes mellitus   . GERD (gastroesophageal reflux disease)   . Hyperlipidemia   . Hypertension   . Hypothyroidism   . Knee injury 2018  . Permanent atrial fibrillation ([place])  06/30/2017  . Stroke [facility]) 2009 and  2011   difficulty with swallowing    Allergies No Known Allergies  IV Location/Drains/Wounds Patient Lines/Drains/Airways Status   Active Line/Drains/Airways    Name:   Placement date:   Placement time:   Site:   Days:   Peripheral IV 08/16/2019 Left Antecubital   08/17/2019    1905    Antecubital   1   External Urinary Catheter   09/12/2019    2003    --   1          Labs/Imaging Results for orders placed or performed during the hospital encounter of 08/22/2019 (from the past 48 hour(s))  Lactic acid, plasma     Status: None   Collection Time: 09/12/2019  7:11 PM  Result Value Ref Range   Lactic Acid, Venous 1.7 0.5 - 1.9 mmol/L    Comment: Performed at [facility], [place] [address], [place], [place] [address]  CBG monitoring, ED     Status: Abnormal   Collection Time: 09/14/2019  7:54 PM  Result Value Ref Range   Glucose-Capillary 216 (H) 70 - 99 mg/dL   Comment 1 Notify RN    Comment 2 Document in Chart   CBC WITH DIFFERENTIAL     Status: Abnormal   Collection Time: 08/24/2019  7:56 PM  Result Value Ref Range   WBC 8.9 4.0 - 10.5 K/uL   RBC 4.08 3.87 - 5.11 MIL/uL   Hemoglobin 11.6 (L) 12.0 - 15.0 g/dL  HCT 36.1 36.0 - 46.0 %   MCV 88.5 80.0 - 100.0 fL   MCH 28.4 26.0 - 34.0 pg   MCHC 32.1 30.0 - 36.0 g/dL   RDW 14.0 11.5 - 15.5 %   Platelets 187 150 - 400 K/uL   nRBC 0.0 0.0 - 0.2 %   Neutrophils Relative % 90 %   Neutro Abs 8.1 (H) 1.7 - 7.7 K/uL   Lymphocytes Relative 5 %   Lymphs Abs 0.4 (L) 0.7 - 4.0 K/uL   Monocytes Relative 4 %   Monocytes Absolute 0.4 0.1 - 1.0 K/uL   Eosinophils Relative 0 %   Eosinophils Absolute 0.0 0.0 - 0.5 K/uL   Basophils Relative 0 %   Basophils Absolute 0.0 0.0 - 0.1 K/uL   Immature Granulocytes 1 %   Abs Immature Granulocytes 0.04 0.00 - 0.07 K/uL    Comment: Performed at [facility], [place] [address], [place], [place] [address]  Comprehensive metabolic panel      Status: Abnormal   Collection Time: 08/25/2019  7:56 PM  Result Value Ref Range   Sodium 138 135 - 145 mmol/L   Potassium 3.7 3.5 - 5.1 mmol/L   Chloride 104 98 - 111 mmol/L   CO2 23 22 - 32 mmol/L   Glucose, Bld 257 (H) 70 - 99 mg/dL   BUN 28 (H) 8 - 23 mg/dL   Creatinine, Ser 1.23 (H) 0.44 - 1.00 mg/dL   Calcium 8.6 (L) 8.9 - 10.3 mg/dL   Total Protein 6.7 6.5 - 8.1 g/dL   Albumin 3.2 (L) 3.5 - 5.0 g/dL   AST 22 15 - 41 U/L   ALT 12 0 - 44 U/L   Alkaline Phosphatase 84 38 - 126 U/L   Total Bilirubin 0.9 0.3 - 1.2 mg/dL   GFR calc non Af Amer 40 (L) >60 mL/min   GFR calc Af Amer 46 (L) >60 mL/min   Anion gap 11 5 - 15    Comment: Performed at [facility], [place] [address], [place], [place] [address]  D-dimer, quantitative     Status: Abnormal   Collection Time: 08/26/2019  7:56 PM  Result Value Ref Range   D-Dimer, Quant 1.78 (H) 0.00 - 0.50 ug/mL-FEU    Comment: (NOTE) At the manufacturer cut-off of 0.50 ug/mL FEU, this assay has been documented to exclude PE with a sensitivity and negative predictive value of 97 to 99%.  At this time, this assay has not been approved by the FDA to exclude DVT/VTE. Results should be correlated with clinical presentation. Performed at [facility], [place] [address] [place], [place] [address] CORRECTED ON 01/18 AT 2057: PREVIOUSLY REPORTED AS 1.78   Procalcitonin     Status: None   Collection Time: 09/05/2019  7:56 PM  Result Value Ref Range   Procalcitonin 0.31 ng/mL    Comment:        Interpretation: PCT (Procalcitonin) <= 0.5 ng/mL: Systemic infection (sepsis) is not likely. Local bacterial infection is possible. (NOTE)       Sepsis PCT Algorithm           Lower Respiratory Tract                                      Infection PCT Algorithm    ----------------------------     ----------------------------  PCT < 0.25 ng/mL                PCT < 0.10 ng/mL         Strongly encourage              Strongly discourage   discontinuation of antibiotics    initiation of antibiotics    ----------------------------     -----------------------------       PCT 0.25 - 0.50 ng/mL            PCT 0.10 - 0.25 ng/mL               OR       >80% decrease in PCT            Discourage initiation of                                            antibiotics      Encourage discontinuation           of antibiotics    ----------------------------     -----------------------------         PCT >= 0.50 ng/mL              PCT 0.26 - 0.50 ng/mL               AND        <80% decrease in PCT             Encourage initiation of                                             antibiotics       Encourage continuation           of antibiotics    ----------------------------     -----------------------------        PCT >= 0.50 ng/mL                  PCT > 0.50 ng/mL               AND         increase in PCT                  Strongly encourage                                      initiation of antibiotics    Strongly encourage escalation           of antibiotics                                     -----------------------------                                           PCT <= 0.25 ng/mL  OR                                        > 80% decrease in PCT                                     Discontinue / Do not initiate                                             antibiotics Performed at [place] [address], [place], [place] [address]   Lactate dehydrogenase     Status: None   Collection Time: 09/03/2019  7:56 PM  Result Value Ref Range   LDH 167 98 - 192 U/L    Comment: Performed at [facility], [place] [address], [place], [place] [address]  Ferritin     Status: None   Collection Time: 09/04/2019  7:56 PM  Result Value Ref Range   Ferritin 258 11 - 307 ng/mL    Comment: Performed at [facility], [place]  [address], [place], [place] [address]  Triglycerides     Status: None   Collection Time: 09/01/2019  7:56 PM  Result Value Ref Range   Triglycerides 73 <150 mg/dL    Comment: Performed at [facility], [place] [address], [place], [place] [address]  Fibrinogen     Status: Abnormal   Collection Time: 08/16/2019  7:56 PM  Result Value Ref Range   Fibrinogen >800 (H) 210 - 475 mg/dL    Comment: Performed at [facility], [place] [address] [place], [place] [address]  C-reactive protein     Status: Abnormal   Collection Time: 08/19/2019  7:56 PM  Result Value Ref Range   CRP 24.6 (H) <1.0 mg/dL    Comment: Performed at [facility], [place] [address] [place], [place] [address]  Protime-INR     Status: Abnormal   Collection Time: 09/09/2019  7:56 PM  Result Value Ref Range   Prothrombin Time 42.1 (H) 11.4 - 15.2 seconds   INR 4.4 (HH) 0.8 - 1.2    Comment: CRITICAL RESULT CALLED TO, READ BACK BY AND VERIFIED WITH: BULLOCK,A @ 2057 ON [id number] BY POTEAT,S (NOTE) INR goal varies based on device and disease states. Performed at [facility], [place] [address] [place], [place] [address]   TSH     Status: None   Collection Time: 08/26/2019 10:26 PM  Result Value Ref Range   TSH 0.619 0.350 - 4.500 uIU/mL    Comment: Performed by a 3rd Generation assay with a functional sensitivity of <=0.01 uIU/mL. Performed at [facility], [place] [address] [place], [place] [address]   CBG monitoring, ED     Status: Abnormal   Collection Time: 09/03/19  2:50 AM  Result Value Ref Range   Glucose-Capillary 230 (H) 70 - 99 mg/dL  POC SARS Coronavirus 2 Ag-ED -     Status: Abnormal   Collection Time: 09/03/19  3:30 AM  Result Value Ref Range   SARS Coronavirus 2 Ag POSITIVE (A) NEGATIVE    Comment: (NOTE) SARS-CoV-2 antigen PRESENT. Positive results indicate the presence of  viral antigens, but clinical correlation with patient  history and other diagnostic information is necessary to determine patient infection status.  Positive results do not rule out bacterial infection or co-infection  with other viruses. False positive results are rare but can occur, and confirmatory RT-PCR testing may be appropriate in some circumstances. The expected result is Negative. Fact Sheet for Patients: PodPark.tn Fact Sheet for Providers: GiftContent.is  This test is not yet approved or cleared by the [place] FDA and  has been authorized for detection and/or diagnosis of SARS-CoV-2 by FDA under an Emergency Use Authorization (EUA).  This EUA will remain in effect (meaning this test can be used) for the duration of  the COVID-19 declaration under Section 564(b)(1) of the Act, 21 U.S.C. section 360bbb-3(b)(1), unless the a uthorization is terminated or revoked sooner.    DG Chest Port 1 View  Result Date: 09/06/2019 CLINICAL DATA:  86 year old female with fever.  Positive COVID-19. EXAM: PORTABLE CHEST 1 VIEW COMPARISON:  Chest radiograph dated 10/10/2017. FINDINGS: Faint bilateral peripheral and subpleural confluent densities most consistent with multifocal pneumonia, likely viral or atypical in etiology including COVID-19. Clinical correlation is recommended. There is no pleural effusion or pneumothorax. Stable cardiac silhouette. No acute osseous pathology. IMPRESSION: Multifocal pneumonia, likely viral or atypical in etiology. Clinical correlation and follow-up to resolution recommended. Electronically Signed   By: Anner Crete M.D.   On: 08/22/2019 19:31    Pending Labs Unresulted Labs (From admission, onward)    Start     Ordered   09/03/19 0500  CBC with Differential/Platelet  Daily,   R     08/21/2019 2158   09/03/19 0500  Comprehensive metabolic panel  Daily,   R     09/13/2019 2158   09/03/19 0500  C-reactive protein  Daily,   R     09/14/2019 2158   09/12/2019  2158  Hemoglobin A1c  Once,   STAT    Comments: To assess prior glycemic control    09/15/2019 2158   08/24/2019 2158  ABO/Rh  Once,   STAT     08/23/2019 2158   08/19/2019 1911  Lactic acid, plasma  Now then every 2 hours,   STAT     08/18/2019 1912   08/19/2019 1911  Blood Culture (routine x 2)  BLOOD CULTURE X 2,   STAT     08/18/2019 1912          Vitals/Pain Today's Vitals   09/03/19 0200 09/03/19 0223 09/03/19 0230 09/03/19 0311  BP: 99/68 99/68 (!) 88/61 105/62  Pulse: 87 94 86 94  Resp: 16 16 17 17   Temp:      TempSrc:      SpO2: 97% 97% 96% 97%  Weight:        Isolation Precautions Airborne and Contact precautions  Medications Medications  diltiazem (CARDIZEM) 125 mg in dextrose 5% 125 mL (1 mg/mL) infusion (0 mg/hr Intravenous Stopped 09/01/2019 2111)  acetaminophen (TYLENOL) tablet 650 mg (has no administration in time range)  diltiazem (CARDIZEM CD) 24 hr capsule 240 mg (has no administration in time range)  Insulin Detemir (LEVEMIR) FlexPen 24 Units (has no administration in time range)  levothyroxine (SYNTHROID) tablet 100 mcg (has no administration in time range)  docusate (COLACE) 50 MG/5ML liquid 100 mg (has no administration in time range)  magic mouthwash (has no administration in time range)  pantoprazole (PROTONIX) EC tablet 40 mg (has no administration in time range)  polyethylene glycol (MIRALAX / GLYCOLAX) packet  17 g (has no administration in time range)  Rivaroxaban (XARELTO) tablet 15 mg (has no administration in time range)  insulin aspart (novoLOG) injection 0-15 Units (has no administration in time range)  remdesivir 200 mg in sodium chloride 0.9% 250 mL IVPB (0 mg Intravenous Stopped 09/03/19 0226)    Followed by  remdesivir 100 mg in sodium chloride 0.9 % 100 mL IVPB (has no administration in time range)  albuterol (VENTOLIN HFA) 108 (90 Base) MCG/ACT inhaler 2 puff (2 puffs Inhalation Given 09/03/19 0208)  dexamethasone (DECADRON) tablet 6 mg (6 mg Oral  Given 08/28/2019 2302)  ascorbic acid (VITAMIN C) tablet 500 mg (500 mg Oral Given 09/15/2019 2302)  zinc sulfate capsule 220 mg (220 mg Oral Given 08/22/2019 2303)  sodium chloride 0.9 % bolus 500 mL (0 mLs Intravenous Stopped 09/15/2019 2137)  dexamethasone (DECADRON) injection 6 mg (6 mg Intravenous Given 09/13/2019 2303)  sodium chloride 0.9 % bolus 500 mL (0 mLs Intravenous Stopped 09/03/19 0226)  tocilizumab (ACTEMRA) 8 mg/kg = 486 mg in sodium chloride 0.9 % 100 mL infusion (0 mg/kg  60.8 kg Intravenous Stopped 09/03/19 0314)    Mobility non-ambulatory

## 2019-09-03 NOTE — ED Notes (Signed)
[facility] contacted for transport and paperwork printed 

## 2019-09-03 NOTE — Progress Notes (Signed)
Inpatient Diabetes Program Recommendations  AACE/ADA: New Consensus Statement on Inpatient Glycemic Control (2015)  Target Ranges:  Prepandial:   less than 140 mg/dL      Peak postprandial:   less than 180 mg/dL (1-2 hours)      Critically ill patients:  140 - 180 mg/dL   Lab Results  Component Value Date   GLUCAP 320 (H) 09/03/2019   HGBA1C 8.6 (H) 09/03/2019    Review of Glycemic Control Results for [name], Joanna Hill (MRN [id number]) as of 09/03/2019 14:09  Ref. Range 09/03/2019 02:50 09/03/2019 08:16 09/03/2019 11:25  Glucose-Capillary Latest Ref Range: 70 - 99 mg/dL 230 (H) 307 (H) 320 (H)   Diabetes history: Type 2 DM Outpatient Diabetes medications: Novolog 2-11 units TID, Levemir 28 units QHS Current orders for Inpatient glycemic control: Levemir 24 units QD, Novolog 0-15 units TID; Decadron 6 mg QD  Inpatient Diabetes Program Recommendations:    Trends > 300's mg/dL and noted current orders.  May consider: -Changing diet to carb modified. -While on steroids, add Novolog 3 units TID (assuming patient is consuming >50% of meal)   Thanks, Bronson Curb, MSN, RNC-OB Diabetes Coordinator [phone] (8a-5p)

## 2019-09-03 NOTE — ED Notes (Signed)
Maryann Conners, daughter wants an update, the doctor said she would get a call at 10am this morning, [phone].

## 2019-09-03 NOTE — Progress Notes (Addendum)
Joanna Hill  [id number] DOB: 1932/05/15 DOA: 08/28/2019 PCP: Seward Carol, MD    Brief Narrative:  86 year old lady with prior history of atrial fibrillation with RVR on Xarelto, prior CVA, dementia, type 2 diabetes presents to ED from nursing home due to hypoxia and fevers.  As per the ED notes she was diagnosed with Covid about a week ago and has been asymptomatic until the day of admission. On arrival to ED she was found to be hypoxic and is currently requiring up to 4 L of nasal cannula oxygen.  She was also found to be in atrial fibrillation with RVR and was started on Cardizem gtt.her rate is better now .  Patient seen and examined at bedside.  She is on 4 L of nasal cannula oxygen and appears comfortable. As per [name]  Her POA, pt complained of itching all over the body last week .   Assessment & Plan:   Principal Problem:   COVID-19 virus infection Active Problems:   Essential hypertension   HYPOTENSION   Altered mental status   Atrial fibrillation with RVR (HCC)   Hypothyroidism   Dementia (HCC)   Constipation   Diabetes mellitus type 2, insulin dependent (HCC)   Current use of long term anticoagulation   Multifocal pneumonia   CKD (chronic kidney disease) stage 3, GFR 30-59 ml/min   Acute respiratory failure with hypoxia probably secondary to COVID-19 viral pneumonia Nasal cannula oxygen to keep sats greater than 90%. Chest x-ray showing multifocal pneumonia. Continue to follow inflammatory markers. Continue with remdesivir and IV Decadron. Continue with bronchodilators and incentive spirometry. Actemra was ordered for elevated CRP,.  No evidence of bacterial infection at this time. Patient has normal liver enzymes.    Atrial fibrillation with RVR on Cardizem gtt. which is stopped at this time.  Borderline low blood pressure measurements.  Gentle hydration. Resume Xarelto for anticoagulation and Cardizem if blood pressure  allows.    Stage III CKD Creatinine appears to be at baseline.    Type 2 diabetes mellitus with hyperglycemia, uncontrolled probably secondary to IV Decadron. Resume home dose of Levemir and change to resistant sliding scale and add NovoLog 3 units 3 times daily AC. Repeat A1c pending CBG (last 3)  Recent Labs    09/03/19 0250 09/03/19 0816 09/03/19 1125  GLUCAP 230* 307* 320*    History of prior CVA and dementia. Patient is pleasantly confused but is able to answer simple questions and appears to be oriented to person and place.    Hypothyroidism Continue with Synthroid.    GERD Stable.  DVT prophylaxis: Xarelto Code Status: full code.  Family Communication: none at bedside.  Disposition Plan: pending resolution of hypoxia and completion of Remdisvir.    Consultants:  None.    Procedures: none.    Antimicrobials: none.    Subjective: Breathing better, no chest pain, wants to eat. No nausea or vomiting.   Objective: Vitals:   09/03/19 0900 09/03/19 1000 09/03/19 1121 09/03/19 1243  BP: 111/70 105/61 (!) 97/54 103/64  Pulse: 98 92 94 99  Resp: (!) 24 17 17 15   Temp:    97.7 F (36.5 C)  TempSrc:    Oral  SpO2: 93% 95% 98% 93%  Weight:        Intake/Output Summary (Last 24 hours) at 09/03/2019 1601 Last data filed at 08/23/2019 2003 Gross per 24 hour  Intake --  Output 0 ml  Net 0 ml   Danley Danker  Weights   08/20/2019 2200  Weight: 60.8 kg    Examination:  General exam: Appears calm and comfortable  Respiratory system: Clear to auscultation. Respiratory effort normal. Cardiovascular system: S1 & S2 heard, RRR. No JVD,  No pedal edema. Gastrointestinal system: Abdomen is nondistended, soft and nontender. Normal bowel sounds heard. Central nervous system: Alert and oriented. No focal neurological deficits. Extremities: Symmetric 5 x 5 power. Skin: No rashes, lesions or ulcers Psychiatry:  Mood & affect appropriate.     Data Reviewed: I have  personally reviewed following labs and imaging studies  CBC: Recent Labs  Lab 08/19/2019 1956 09/03/19 0559  WBC 8.9 7.6  NEUTROABS 8.1* 7.1  HGB 11.6* 10.6*  HCT 36.1 34.1*  MCV 88.5 90.5  PLT 187 [id number]   Basic Metabolic Panel: Recent Labs  Lab 09/01/2019 1956 09/03/19 0559  NA 138 139  K 3.7 3.9  CL 104 108  CO2 23 22  GLUCOSE 257* 341*  BUN 28* 34*  CREATININE 1.23* 1.23*  CALCIUM 8.6* 8.2*   GFR: CrCl cannot be calculated (Unknown ideal weight.). Liver Function Tests: Recent Labs  Lab 09/13/2019 1956 09/03/19 0559  AST 22 17  ALT 12 11  ALKPHOS 84 80  BILITOT 0.9 0.7  PROT 6.7 6.1*  ALBUMIN 3.2* 2.8*   No results for input(s): LIPASE, AMYLASE in the last 168 hours. No results for input(s): AMMONIA in the last 168 hours. Coagulation Profile: Recent Labs  Lab 09/04/2019 1956  INR 4.4*   Cardiac Enzymes: No results for input(s): CKTOTAL, CKMB, CKMBINDEX, TROPONINI in the last 168 hours. BNP (last 3 results) No results for input(s): PROBNP in the last 8760 hours. HbA1C: Recent Labs    09/03/19 0559  HGBA1C 8.6*   CBG: Recent Labs  Lab 08/31/2019 1954 09/03/19 0250 09/03/19 0816 09/03/19 1125  GLUCAP 216* 230* 307* 320*   Lipid Profile: Recent Labs    09/01/2019 1956  TRIG 73   Thyroid Function Tests: Recent Labs    08/26/2019 2226  TSH 0.619   Anemia Panel: Recent Labs    09/05/2019 1956  FERRITIN 258   Sepsis Labs: Recent Labs  Lab 08/17/2019 1911 08/31/2019 1956  PROCALCITON  --  0.31  LATICACIDVEN 1.7  --     Recent Results (from the past 240 hour(s))  MRSA PCR Screening     Status: None   Collection Time: 09/03/19 12:54 PM   Specimen: Nasopharyngeal  Result Value Ref Range Status   MRSA by PCR NEGATIVE NEGATIVE Final    Comment:        The GeneXpert MRSA Assay (FDA approved for NASAL specimens only), is one component of a comprehensive MRSA colonization surveillance program. It is not intended to diagnose MRSA infection nor to  guide or monitor treatment for MRSA infections. Performed at [facility], [place] [address], [place], [place] [address]          Radiology Studies: DG Chest [place] 1 View  Result Date: 09/03/2019 CLINICAL DATA:  86 year old female with fever.  Positive COVID-19. EXAM: PORTABLE CHEST 1 VIEW COMPARISON:  Chest radiograph dated 10/10/2017. FINDINGS: Faint bilateral peripheral and subpleural confluent densities most consistent with multifocal pneumonia, likely viral or atypical in etiology including COVID-19. Clinical correlation is recommended. There is no pleural effusion or pneumothorax. Stable cardiac silhouette. No acute osseous pathology. IMPRESSION: Multifocal pneumonia, likely viral or atypical in etiology. Clinical correlation and follow-up to resolution recommended. Electronically Signed   By: Laren Everts.D.  On: 09/07/2019 19:31        Scheduled Meds: . albuterol  2 puff Inhalation Q6H  . vitamin C  500 mg Oral Daily  . dexamethasone  6 mg Oral Q24H  . docusate  100 mg Oral BID  . insulin aspart  0-15 Units Subcutaneous TID WC  . insulin detemir  24 Units Subcutaneous Daily  . levothyroxine  100 mcg Oral Q0600  . magic mouthwash  5 mL Oral BID  . pantoprazole  40 mg Oral BID AC  . Rivaroxaban  15 mg Oral Q supper  . zinc sulfate  220 mg Oral Daily   Continuous Infusions: . remdesivir 100 mg in NS 100 mL 100 mg (09/03/19 0819)     LOS: 1 day       Hosie Poisson, MD [facility] 09/03/2019, 4:01 PM

## 2019-09-04 LAB — CBC WITH DIFFERENTIAL/PLATELET
Abs Immature Granulocytes: 0.11 10*3/uL — ABNORMAL HIGH (ref 0.00–0.07)
Basophils Absolute: 0 10*3/uL (ref 0.0–0.1)
Basophils Relative: 0 %
Eosinophils Absolute: 0 10*3/uL (ref 0.0–0.5)
Eosinophils Relative: 0 %
HCT: 36.1 % (ref 36.0–46.0)
Hemoglobin: 11.5 g/dL — ABNORMAL LOW (ref 12.0–15.0)
Immature Granulocytes: 1 %
Lymphocytes Relative: 3 %
Lymphs Abs: 0.3 10*3/uL — ABNORMAL LOW (ref 0.7–4.0)
MCH: 27.6 pg (ref 26.0–34.0)
MCHC: 31.9 g/dL (ref 30.0–36.0)
MCV: 86.8 fL (ref 80.0–100.0)
Monocytes Absolute: 0.4 10*3/uL (ref 0.1–1.0)
Monocytes Relative: 4 %
Neutro Abs: 9.2 10*3/uL — ABNORMAL HIGH (ref 1.7–7.7)
Neutrophils Relative %: 92 %
Platelets: 215 10*3/uL (ref 150–400)
RBC: 4.16 MIL/uL (ref 3.87–5.11)
RDW: 14.3 % (ref 11.5–15.5)
WBC: 10.1 10*3/uL (ref 4.0–10.5)
nRBC: 0 % (ref 0.0–0.2)

## 2019-09-04 LAB — COMPREHENSIVE METABOLIC PANEL
ALT: 11 U/L (ref 0–44)
AST: 16 U/L (ref 15–41)
Albumin: 2.6 g/dL — ABNORMAL LOW (ref 3.5–5.0)
Alkaline Phosphatase: 77 U/L (ref 38–126)
Anion gap: 12 (ref 5–15)
BUN: 57 mg/dL — ABNORMAL HIGH (ref 8–23)
CO2: 23 mmol/L (ref 22–32)
Calcium: 9.1 mg/dL (ref 8.9–10.3)
Chloride: 105 mmol/L (ref 98–111)
Creatinine, Ser: 1.44 mg/dL — ABNORMAL HIGH (ref 0.44–1.00)
GFR calc Af Amer: 38 mL/min — ABNORMAL LOW (ref >60)
GFR calc non Af Amer: 33 mL/min — ABNORMAL LOW (ref >60)
Glucose, Bld: 220 mg/dL — ABNORMAL HIGH (ref 70–99)
Potassium: 4.3 mmol/L (ref 3.5–5.1)
Sodium: 140 mmol/L (ref 135–145)
Total Bilirubin: 0.5 mg/dL (ref 0.3–1.2)
Total Protein: 5.9 g/dL — ABNORMAL LOW (ref 6.5–8.1)

## 2019-09-04 LAB — GLUCOSE, CAPILLARY
Glucose-Capillary: 208 mg/dL — ABNORMAL HIGH (ref 70–99)
Glucose-Capillary: 261 mg/dL — ABNORMAL HIGH (ref 70–99)
Glucose-Capillary: 133 mg/dL — ABNORMAL HIGH (ref 70–99)
Glucose-Capillary: 71 mg/dL (ref 70–99)

## 2019-09-04 LAB — C-REACTIVE PROTEIN: CRP: 21.5 mg/dL — ABNORMAL HIGH (ref <1.0)

## 2019-09-04 MED ORDER — GERHARDT'S BUTT CREAM
TOPICAL_CREAM | Freq: Two times a day (BID) | CUTANEOUS | Status: DC
  Administered 2019-09-06: 1 via TOPICAL
  Filled 2019-09-04: qty 1

## 2019-09-04 MED ORDER — ALBUTEROL SULFATE HFA 108 (90 BASE) MCG/ACT IN AERS
2.0000 | INHALATION_SPRAY | Freq: Four times a day (QID) | RESPIRATORY_TRACT | Status: DC | PRN
  Administered 2019-09-05 (×2): 2 via RESPIRATORY_TRACT
  Filled 2019-09-04: qty 6.7

## 2019-09-04 NOTE — Progress Notes (Signed)
Patient's Sat O2 dropped to low 80s on 4L oxygen [place]. Increased to 7 L and then to 10 L [place] Sat O2 remained on 88%-90%. MD was notified. Per MD patient is receiving oxygen - HFNC 10 L. Sat O2 increased to 93%. Will continue to monitor.

## 2019-09-04 NOTE — Progress Notes (Signed)
Patient's daughter Joanna Hill called and asked updates on her mother. All questions were answered. Family was encouraged to call with any other questions or concerns. Will continue to monitor.

## 2019-09-04 NOTE — Progress Notes (Signed)
PROGRESS NOTE    Joanna Hill  [id number] DOB: 03-16-1932 DOA: 08/22/2019 PCP: Seward Carol, MD    Brief Narrative:  86 year old lady with prior history of atrial fibrillation with RVR on Xarelto, prior CVA, dementia, type 2 diabetes presents to ED from nursing home due to hypoxia and fevers.  As per the ED notes she was diagnosed with Covid about a week ago and has been asymptomatic until the day of admission. On arrival to ED she was found to be hypoxic and is currently requiring up to 4 L of nasal cannula oxygen.  She was also found to be in atrial fibrillation with RVR and was started on Cardizem gtt.her rate is better now .  Patient seen and examined at bedside.  She had been  on 4 L of nasal cannula oxygen but had increase in O2 supplementation need on 1/20.     Assessment & Plan:   Principal Problem:   COVID-19 virus infection Active Problems:   Essential hypertension   HYPOTENSION   Altered mental status   Atrial fibrillation with RVR (HCC)   Hypothyroidism   Dementia (HCC)   Constipation   Diabetes mellitus type 2, insulin dependent (HCC)   Current use of long term anticoagulation   Multifocal pneumonia   CKD (chronic kidney disease) stage 3, GFR 30-59 ml/min   Acute respiratory failure with hypoxia probably secondary to COVID-19 viral pneumonia Nasal cannula oxygen to keep sats greater than 90%. Chest x-ray on presentation showing multifocal pneumonia. Continue to follow inflammatory markers. Continue with remdesivir and IV Decadron. Continue with bronchodilators and incentive spirometry. Actemra was ordered for elevated CRP.  No evidence of bacterial infection at this time. Patient has normal liver enzymes. Due to acute decompensation on respiratory status, will repeat CXR. Continue RT consult, patient on HFNC currently.    Atrial fibrillation with RVR on Cardizem gtt. which is stopped at this time.  Borderline low blood pressure measurements.  S/p Gentle  hydration. Continue Xarelto for anticoagulation and Cardizem if blood pressure allows.    Stage III CKD Creatinine appears to be at baseline but with slight increase in BUN today. Will continue to monitor.     Type 2 diabetes mellitus with hyperglycemia, uncontrolled probably secondary to IV Decadron. Continue home dose of Levemir and continue resistant sliding scale and add NovoLog 3 units 3 times daily AC.  CBG (last 3)  Recent Labs    09/04/19 0759 09/04/19 1137 09/04/19 1629  GLUCAP 208* 261* 133*    History of prior CVA and dementia. Patient is pleasantly confused but is able to answer simple questions and appears to be oriented to person and place.    Hypothyroidism Continue with Synthroid.    GERD Stable.  DVT prophylaxis: Xarelto Code Status: full code.  Family Communication: family has been updated per nursing staff.  Disposition Plan: pending resolution of hypoxia and completion of Remdisvir.    Consultants:  None.    Procedures: none.    Antimicrobials: none.    Subjective: Patient feels tired. Shallow breaths. On [place] this am but had to be started on HFNC.   Objective: Vitals:   09/04/19 1421 09/04/19 1632 09/04/19 1737 09/04/19 1842  BP:  105/65    Pulse: (!) 101 (!) 105 (!) 104 (!) 101  Resp: 19 16 18 19   Temp:  98 F (36.7 C)    TempSrc:  Oral    SpO2: 98% 90% 91% 93%  Weight:      Height:  Intake/Output Summary (Last 24 hours) at 09/04/2019 1909 Last data filed at 09/04/2019 1653 Gross per 24 hour  Intake 820 ml  Output 975 ml  Net -155 ml   Filed Weights   09/05/2019 2200 09/03/19 2211  Weight: 60.8 kg 60.8 kg    Examination:  General exam: Appears calm, in NAD  Respiratory system: Shallow breaths, no respiratory distress, no wheezing.  Cardiovascular system: S1 & S2 present, RRR. No pedal edema. Gastrointestinal system: Abdomen is nondistended, soft and nontender.  Central nervous system: Alert and oriented to  person and to place. No focal neurological deficits. Extremities: Symmetric 5 x 5 power. Skin: No rashes, lesions or ulcers Psychiatry:  Mood & affect appropriate.     Data Reviewed: I have personally reviewed following labs and imaging studies  CBC: Recent Labs  Lab 09/06/2019 1956 09/03/19 0559 09/04/19 0701  WBC 8.9 7.6 10.1  NEUTROABS 8.1* 7.1 9.2*  HGB 11.6* 10.6* 11.5*  HCT 36.1 34.1* 36.1  MCV 88.5 90.5 86.8  PLT 187 157 [id number]   Basic Metabolic Panel: Recent Labs  Lab 09/13/2019 1956 09/03/19 0559 09/04/19 0701  NA 138 139 140  K 3.7 3.9 4.3  CL 104 108 105  CO2 23 22 23   GLUCOSE 257* 341* 220*  BUN 28* 34* 57*  CREATININE 1.23* 1.23* 1.44*  CALCIUM 8.6* 8.2* 9.1   GFR: Estimated Creatinine Clearance: 25.7 mL/min (A) (by C-G formula based on SCr of 1.44 mg/dL (H)). Liver Function Tests: Recent Labs  Lab 08/23/2019 1956 09/03/19 0559 09/04/19 0701  AST 22 17 16   ALT 12 11 11   ALKPHOS 84 80 77  BILITOT 0.9 0.7 0.5  PROT 6.7 6.1* 5.9*  ALBUMIN 3.2* 2.8* 2.6*   No results for input(s): LIPASE, AMYLASE in the last 168 hours. No results for input(s): AMMONIA in the last 168 hours. Coagulation Profile: Recent Labs  Lab 09/07/2019 1956  INR 4.4*   Cardiac Enzymes: No results for input(s): CKTOTAL, CKMB, CKMBINDEX, TROPONINI in the last 168 hours. BNP (last 3 results) No results for input(s): PROBNP in the last 8760 hours. HbA1C: Recent Labs    09/03/19 0559  HGBA1C 8.6*   CBG: Recent Labs  Lab 09/03/19 1658 09/03/19 2105 09/04/19 0759 09/04/19 1137 09/04/19 1629  GLUCAP 131* 163* 208* 261* 133*   Lipid Profile: Recent Labs    09/03/2019 1956  TRIG 73   Thyroid Function Tests: Recent Labs    08/26/2019 2226  TSH 0.619   Anemia Panel: Recent Labs    09/03/2019 1956  FERRITIN 258   Sepsis Labs: Recent Labs  Lab 09/01/2019 1911 08/23/2019 1956  PROCALCITON  --  0.31  LATICACIDVEN 1.7  --     Recent Results (from the past 240 hour(s))    Blood Culture (routine x 2)     Status: None (Preliminary result)   Collection Time: 08/18/2019  7:58 PM   Specimen: BLOOD  Result Value Ref Range Status   Specimen Description   Final    BLOOD BLOOD LEFT FOREARM Performed at [facility], [place] [address] [place], [place] [address]    Special Requests   Final    BOTTLES DRAWN AEROBIC AND ANAEROBIC Blood Culture adequate volume Performed at [place] [address] [place], [place] [address]    Culture   Final    NO GROWTH 1 DAY Performed at [facility], [place] [address] [place], [place] [address]    Report Status PENDING  Incomplete  MRSA PCR Screening     Status: None   Collection Time: 09/03/19 12:54 PM   Specimen: Nasopharyngeal  Result Value Ref Range Status   MRSA by PCR NEGATIVE NEGATIVE Final    Comment:        The GeneXpert MRSA Assay (FDA approved for NASAL specimens only), is one component of a comprehensive MRSA colonization surveillance program. It is not intended to diagnose MRSA infection nor to guide or monitor treatment for MRSA infections. Performed at [facility], [place] [address] [place], [place] [address]          Radiology Studies: DG Chest [place] 1 View  Result Date: 08/16/2019 CLINICAL DATA:  86 year old female with fever.  Positive COVID-19. EXAM: PORTABLE CHEST 1 VIEW COMPARISON:  Chest radiograph dated 10/10/2017. FINDINGS: Faint bilateral peripheral and subpleural confluent densities most consistent with multifocal pneumonia, likely viral or atypical in etiology including COVID-19. Clinical correlation is recommended. There is no pleural effusion or pneumothorax. Stable cardiac silhouette. No acute osseous pathology. IMPRESSION: Multifocal pneumonia, likely viral or atypical in etiology. Clinical correlation and follow-up to resolution recommended. Electronically Signed   By: Anner Crete M.D.   On: 08/26/2019 19:31        Scheduled  Meds: . vitamin C  500 mg Oral Daily  . dexamethasone  6 mg Oral Q24H  . docusate  100 mg Oral BID  . Gerhardt's butt cream   Topical BID  . insulin aspart  0-15 Units Subcutaneous TID WC  . insulin detemir  24 Units Subcutaneous Daily  . levothyroxine  100 mcg Oral Q0600  . magic mouthwash  5 mL Oral BID  . pantoprazole  40 mg Oral BID AC  . Rivaroxaban  15 mg Oral Q supper  . zinc sulfate  220 mg Oral Daily   Continuous Infusions: . remdesivir 100 mg in NS 100 mL 100 mg (09/04/19 0906)     LOS: 2 days   Time spent: 35 minutes    Blain Pais, MD [facility] 09/04/2019, 7:09 PM

## 2019-09-04 NOTE — Consult Note (Signed)
[facility] Nurse Consult Note: Reason for Consult:Moisture associated skin damage(MASD) to coccyx.  Patient is incontinent and has generalized anasarca Wound type:MASD Pressure Injury POA: NA Measurement: 1 cm x 1 cm x 0.1 cm (erythematous with nonintact central pinpoint opening) Wound [id number] and moist Drainage (amount, consistency, odor) minimal serosanguinous  No odor.  Periwound:nonblanchable erythema Dressing procedure/placement/frequency: Cleanse sacrum and buttocks with soap and water and pat dry.  Apply Gerhardts butt paste twice daily and PRN soilage.  Will not follow at this time.  Please re-consult if needed.  Domenic Moras MSN, RN, FNP-BC CWON Wound, Ostomy, Continence Nurse Pager [phone]

## 2019-09-05 ENCOUNTER — Inpatient Hospital Stay (HOSPITAL_COMMUNITY): Payer: Medicare Other

## 2019-09-05 DIAGNOSIS — E119 Type 2 diabetes mellitus without complications: Secondary | ICD-10-CM

## 2019-09-05 DIAGNOSIS — I4891 Unspecified atrial fibrillation: Secondary | ICD-10-CM

## 2019-09-05 DIAGNOSIS — U071 COVID-19: Principal | ICD-10-CM

## 2019-09-05 DIAGNOSIS — Z794 Long term (current) use of insulin: Secondary | ICD-10-CM

## 2019-09-05 DIAGNOSIS — N1832 Chronic kidney disease, stage 3b: Secondary | ICD-10-CM

## 2019-09-05 DIAGNOSIS — F0390 Unspecified dementia without behavioral disturbance: Secondary | ICD-10-CM

## 2019-09-05 LAB — GLUCOSE, CAPILLARY
Glucose-Capillary: 107 mg/dL — ABNORMAL HIGH (ref 70–99)
Glucose-Capillary: 124 mg/dL — ABNORMAL HIGH (ref 70–99)
Glucose-Capillary: 90 mg/dL (ref 70–99)
Glucose-Capillary: 93 mg/dL (ref 70–99)

## 2019-09-05 LAB — CBC WITH DIFFERENTIAL/PLATELET
Abs Immature Granulocytes: 0.05 10*3/uL (ref 0.00–0.07)
Basophils Absolute: 0 10*3/uL (ref 0.0–0.1)
Basophils Relative: 0 %
Eosinophils Absolute: 0 10*3/uL (ref 0.0–0.5)
Eosinophils Relative: 0 %
HCT: 40.2 % (ref 36.0–46.0)
Hemoglobin: 12.9 g/dL (ref 12.0–15.0)
Immature Granulocytes: 1 %
Lymphocytes Relative: 5 %
Lymphs Abs: 0.4 10*3/uL — ABNORMAL LOW (ref 0.7–4.0)
MCH: 28 pg (ref 26.0–34.0)
MCHC: 32.1 g/dL (ref 30.0–36.0)
MCV: 87.2 fL (ref 80.0–100.0)
Monocytes Absolute: 0.2 10*3/uL (ref 0.1–1.0)
Monocytes Relative: 3 %
Neutro Abs: 6.6 10*3/uL (ref 1.7–7.7)
Neutrophils Relative %: 91 %
Platelets: 247 10*3/uL (ref 150–400)
RBC: 4.61 MIL/uL (ref 3.87–5.11)
RDW: 14.4 % (ref 11.5–15.5)
WBC: 7.2 10*3/uL (ref 4.0–10.5)
nRBC: 0 % (ref 0.0–0.2)

## 2019-09-05 LAB — COMPREHENSIVE METABOLIC PANEL
ALT: 12 U/L (ref 0–44)
AST: 22 U/L (ref 15–41)
Albumin: 2.8 g/dL — ABNORMAL LOW (ref 3.5–5.0)
Alkaline Phosphatase: 82 U/L (ref 38–126)
Anion gap: 12 (ref 5–15)
BUN: 56 mg/dL — ABNORMAL HIGH (ref 8–23)
CO2: 23 mmol/L (ref 22–32)
Calcium: 9.4 mg/dL (ref 8.9–10.3)
Chloride: 107 mmol/L (ref 98–111)
Creatinine, Ser: 1.18 mg/dL — ABNORMAL HIGH (ref 0.44–1.00)
GFR calc Af Amer: 49 mL/min — ABNORMAL LOW (ref >60)
GFR calc non Af Amer: 42 mL/min — ABNORMAL LOW (ref >60)
Glucose, Bld: 99 mg/dL (ref 70–99)
Potassium: 3.8 mmol/L (ref 3.5–5.1)
Sodium: 142 mmol/L (ref 135–145)
Total Bilirubin: 0.5 mg/dL (ref 0.3–1.2)
Total Protein: 6.7 g/dL (ref 6.5–8.1)

## 2019-09-05 LAB — D-DIMER, QUANTITATIVE: D-Dimer, Quant: 1.4 ug/mL-FEU — ABNORMAL HIGH (ref 0.00–0.50)

## 2019-09-05 LAB — BLOOD GAS, ARTERIAL
Acid-Base Excess: 0.5 mmol/L (ref 0.0–2.0)
Bicarbonate: 24.3 mmol/L (ref 20.0–28.0)
Drawn by: 560021
FIO2: 100
O2 Saturation: 84.6 %
Patient temperature: 36.3
pCO2 arterial: 35.6 mmHg (ref 32.0–48.0)
pH, Arterial: 7.445 (ref 7.350–7.450)
pO2, Arterial: 50.4 mmHg — ABNORMAL LOW (ref 83.0–108.0)

## 2019-09-05 LAB — C-REACTIVE PROTEIN: CRP: 13.5 mg/dL — ABNORMAL HIGH (ref <1.0)

## 2019-09-05 MED ORDER — IPRATROPIUM-ALBUTEROL 0.5-2.5 (3) MG/3ML IN SOLN: 3.0000 mL | Freq: Four times a day (QID) | RESPIRATORY_TRACT | Status: DC

## 2019-09-05 MED ORDER — CHLORHEXIDINE GLUCONATE 0.12 % MT SOLN
15.0000 mL | Freq: Two times a day (BID) | OROMUCOSAL | Status: DC
  Administered 2019-09-05 – 2019-09-07 (×4): 15 mL via OROMUCOSAL
  Filled 2019-09-05 (×2): qty 15

## 2019-09-05 MED ORDER — ALBUTEROL SULFATE HFA 108 (90 BASE) MCG/ACT IN AERS
2.0000 | INHALATION_SPRAY | Freq: Four times a day (QID) | RESPIRATORY_TRACT | Status: DC | PRN
  Administered 2019-09-07: 02:00:00 2 via RESPIRATORY_TRACT
  Filled 2019-09-05: qty 6.7

## 2019-09-05 MED ORDER — ALBUTEROL SULFATE HFA 108 (90 BASE) MCG/ACT IN AERS
2.0000 | INHALATION_SPRAY | Freq: Three times a day (TID) | RESPIRATORY_TRACT | Status: DC
  Administered 2019-09-05: 2 via RESPIRATORY_TRACT
  Filled 2019-09-05: qty 6.7

## 2019-09-05 MED ORDER — FUROSEMIDE 10 MG/ML IJ SOLN
40.0000 mg | Freq: Once | INTRAMUSCULAR | Status: AC
  Administered 2019-09-05: 40 mg via INTRAVENOUS
  Filled 2019-09-05: qty 4

## 2019-09-05 MED ORDER — IPRATROPIUM-ALBUTEROL 20-100 MCG/ACT IN AERS
1.0000 | INHALATION_SPRAY | Freq: Four times a day (QID) | RESPIRATORY_TRACT | Status: DC
  Administered 2019-09-06 – 2019-09-07 (×5): 1 via RESPIRATORY_TRACT
  Filled 2019-09-05: qty 4

## 2019-09-05 MED ORDER — FUROSEMIDE 10 MG/ML IJ SOLN
40.0000 mg | Freq: Two times a day (BID) | INTRAMUSCULAR | Status: DC
  Administered 2019-09-05: 40 mg via INTRAVENOUS
  Filled 2019-09-05: qty 4

## 2019-09-05 MED ORDER — FUROSEMIDE 10 MG/ML IJ SOLN
80.0000 mg | Freq: Once | INTRAMUSCULAR | Status: AC
  Administered 2019-09-05: 23:00:00 80 mg via INTRAVENOUS
  Filled 2019-09-05: qty 8

## 2019-09-05 MED ORDER — SODIUM CHLORIDE 0.9 % IV SOLN
INTRAVENOUS | Status: DC | PRN
  Administered 2019-09-05: 250 mL via INTRAVENOUS

## 2019-09-05 MED ORDER — ORAL CARE MOUTH RINSE
15.0000 mL | Freq: Two times a day (BID) | OROMUCOSAL | Status: DC
  Administered 2019-09-06 – 2019-09-07 (×3): 15 mL via OROMUCOSAL

## 2019-09-05 MED ORDER — HEPARIN (PORCINE) 25000 UT/250ML-% IV SOLN
700.0000 [IU]/h | INTRAVENOUS | Status: DC
  Administered 2019-09-06: 14:00:00 900 [IU]/h via INTRAVENOUS
  Filled 2019-09-05: qty 250

## 2019-09-05 NOTE — Significant Event (Addendum)
Patient with multiple rapid responses throughout the day due to progressive hypoxia. Currently on 15L HFNC with NRB. Spoke to family extensively regarding goals of care and current condition. At this time would like to continue with Full Code status. Staff reports that we are unable to use heated high flow on this unit. Advised that only location at this time for this would be MICU. Have consulted Critical Care for further recommendations.

## 2019-09-05 NOTE — Consult Note (Signed)
NAME:  KEA TEMKIN, MRN:  [id number], DOB:  07-Jul-1932, LOS: 3 ADMISSION DATE:  08/18/2019, CONSULTATION DATE:  09/05/19 REFERRING MD: Hayes Ludwig  CHIEF COMPLAINT:  Hypoxia   Brief History   DENEISE HOEKMAN is a 86 y.o. female who was admitted 1/18 with acute hypoxic respiratory failure 2/2 COVID PNA.  History of present illness   UTAHNA ZUKAUSKAS is a 86 y.o. female who has a PMH including but not limited to A.fib with RVR (on xarelto, DM, prior CVA, dementa (see "past medical history" for rest).  She resides at [place] home.  She presented to [facility] ED 1/18 with hypoxia.  She had apparently been diagnosed with COVID 1 week prior but was initially asymptomatic  On day of presentation, she was found to be hypoxic, febrile, and somewhat altered so was sent to the ED.    She was admitted by [facility] to the progressive floor and was initially requiring 4L O2.  On 1/21, she had increase in O2 requirements to 15L HFNC and also had NRB placed.  She had sats in low to mid 90's at rest with dips into the 70's with any exertion.  There was concern given frequent desaturations; therefore, PCCM called in consultation.  Per RRT notes from earlier, pt had cyanotic lips and fingers at one point earlier in the afternoon.  Heated HFNC was recommended but apparently was not able to be set up on the floor pt was located on.  ABG on 15L regular HFNC with significant hypoxia (7.44 / 35 / 50). Prior to my arrival overnight, I asked for [facility] to be set up while awaiting ICU transfer.  On [place] at 100% with 30L flow, sats were 95% - 96%.   Past Medical History  has HYPERLIPIDEMIA; Essential hypertension; CORONARY ARTERY DISEASE; HYPOTENSION; Altered mental status; DYSPHAGIA; Carotid artery disease ([place]); Hyperkalemia; Atrial fibrillation with RVR ([place]); Hypothyroidism; Hypertension; History of CVA in adulthood; Dementia ([place]); Constipation; GERD (gastroesophageal reflux disease); Diabetes mellitus type 2, insulin dependent ([place]);  Paroxysmal atrial fibrillation ([place]); Current use of long term anticoagulation; Leg swelling; Acute on chronic diastolic heart failure ([place]); Permanent atrial fibrillation ([place]); Chest pain; Chronic diastolic CHF (congestive heart failure) ([place]); COVID-19 virus infection; Multifocal pneumonia; and CKD (chronic kidney disease) stage 3, GFR 30-59 ml/min on their problem list.  Significant Hospital Events   1/18 > admit. 1/21 > transfer to ICU.  Consults:  PCCM.  Procedures:  None.  Significant Diagnostic Tests:  CXR 1/21 > multifocal infiltrates.  Micro Data:  Blood 1/18 >  COVID reported positive from outside facility.  Antimicrobials:  None.   Interim history/subjective:  Comfortable on HHFNC at 100% with 30L flow.  Sats in mid to high 90s.  Objective:  Blood pressure (!) 102/58, pulse (!) 113, temperature (!) 97.4 F (36.3 C), temperature source Axillary, resp. rate (!) 23, height 5\' 5"  (1.651 m), weight 60.8 kg, SpO2 94 %.    FiO2 (%):  [100 %] 100 %   Intake/Output Summary (Last 24 hours) at 09/05/2019 2159 Last data filed at 09/05/2019 1304 Gross per 24 hour  Intake 484 ml  Output 1750 ml  Net -1266 ml   Filed Weights   09/12/2019 2200 09/03/19 2211  Weight: 60.8 kg 60.8 kg    Examination: General: Elderly female, chronically ill appearing, in NAD. Neuro: A&O x 3, no deficits. HEENT: [place]/AT. Sclerae anicteric.  EOMI. Cardiovascular: Tachy, Mariel Sleet, no M/R/G.  Lungs: Respirations even and unlabored.  Coarse. Abdomen: BS x  4, soft, NT/ND.  Musculoskeletal: No gross deformities, no edema.  Skin: Intact, warm, no rashes.  Assessment & Plan:   Acute hypoxic respiratory failure 2/2 COVID PNA - increasing O2 requirements since admission and now up to 100% HHFNC at 30L flow.  She received 1 dose actemra on admission. - Will transfer to ICU for closer monitoring. - Continue HHFNC.  Will accept SpO2 80 - 85 as long as pt comfortable. - Intubate only if signs of distress  (accessory muscle use, nasal flaring, paradoxical pattern, etc). - Continue decadron and remdesivir. - Continue vitamin C and zinc. - Continue BD's, bronchial hygiene.  Hx A.fib with RVR (on xarelto) - required cardizem briefly in ED. - Continue home xarelto, cardizem,furosemide.  AoCKD. - Continue supportive care.  Hx DM. - Continue SSI and levemir.  Hx hypothyroidism. - Continue home synthroid.  Hx dementia, prior CVA. - Continue supportive care.  Best Practice:  Diet: carb mod. Pain/Anxiety/Delirium protocol (if indicated): N/A. VAP protocol (if indicated): N/A. DVT prophylaxis: Xarelto. GI prophylaxis: N/A. Glucose control: SSI, levemir. Mobility: Bedrest. Code Status: Full. Family Communication: Daughter in law (Ellee Knickrehm who works with PCCM) updated overnight 1/21 by Dr. Valeta Harms. Disposition: ICU.  Labs   CBC: Recent Labs  Lab 08/17/2019 1956 09/03/19 0559 09/04/19 0701 09/05/19 0603  WBC 8.9 7.6 10.1 7.2  NEUTROABS 8.1* 7.1 9.2* 6.6  HGB 11.6* 10.6* 11.5* 12.9  HCT 36.1 34.1* 36.1 40.2  MCV 88.5 90.5 86.8 87.2  PLT 187 157 215 [id number]   Basic Metabolic Panel: Recent Labs  Lab 09/07/2019 1956 09/03/19 0559 09/04/19 0701 09/05/19 0603  NA 138 139 140 142  K 3.7 3.9 4.3 3.8  CL 104 108 105 107  CO2 23 22 23 23   GLUCOSE 257* 341* 220* 99  BUN 28* 34* 57* 56*  CREATININE 1.23* 1.23* 1.44* 1.18*  CALCIUM 8.6* 8.2* 9.1 9.4   GFR: Estimated Creatinine Clearance: 31.4 mL/min (A) (by C-G formula based on SCr of 1.18 mg/dL (H)). Recent Labs  Lab 09/15/2019 1911 08/29/2019 1956 09/03/19 0559 09/04/19 0701 09/05/19 0603  PROCALCITON  --  0.31  --   --   --   WBC  --  8.9 7.6 10.1 7.2  LATICACIDVEN 1.7  --   --   --   --    Liver Function Tests: Recent Labs  Lab 09/07/2019 1956 09/03/19 0559 09/04/19 0701 09/05/19 0603  AST 22 17 16 22   ALT 12 11 11 12   ALKPHOS 84 80 77 82  BILITOT 0.9 0.7 0.5 0.5  PROT 6.7 6.1* 5.9* 6.7  ALBUMIN 3.2* 2.8* 2.6* 2.8*     No results for input(s): LIPASE, AMYLASE in the last 168 hours. No results for input(s): AMMONIA in the last 168 hours. ABG    Component Value Date/Time   PHART 7.445 09/05/2019 2038   PCO2ART 35.6 09/05/2019 2038   PO2ART 50.4 (L) 09/05/2019 2038   HCO3 24.3 09/05/2019 2038   TCO2 27 07/11/2013 1218   O2SAT 84.6 09/05/2019 2038    Coagulation Profile: Recent Labs  Lab 09/09/2019 1956  INR 4.4*   Cardiac Enzymes: No results for input(s): CKTOTAL, CKMB, CKMBINDEX, TROPONINI in the last 168 hours. HbA1C: Hgb A1c MFr Bld  Date/Time Value Ref Range Status  09/03/2019 05:59 AM 8.6 (H) 4.8 - 5.6 % Final    Comment:    (NOTE) Pre diabetes:          5.7%-6.4% Diabetes:              >  6.4% Glycemic control for   <7.0% adults with diabetes   02/28/2017 04:26 PM 10.6 (H) 4.8 - 5.6 % Final    Comment:    (NOTE)         Pre-diabetes: 5.7 - 6.4         Diabetes: >6.4         Glycemic control for adults with diabetes: <7.0    CBG: Recent Labs  Lab 09/04/19 2119 09/05/19 0817 09/05/19 1152 09/05/19 1712 09/05/19 2110  GLUCAP 71 107* 124* 90 93    Review of Systems:   All negative; except for those that are bolded, which indicate positives.  Constitutional: weight loss, weight gain, night sweats, fevers, chills, fatigue, weakness.  HEENT: headaches, sore throat, sneezing, nasal congestion, post nasal drip, difficulty swallowing, tooth/dental problems, visual complaints, visual changes, ear aches. Neuro: difficulty with speech, weakness, numbness, ataxia. CV:  chest pain, orthopnea, PND, swelling in lower extremities, dizziness, palpitations, syncope.  Resp: cough, hemoptysis, dyspnea, wheezing. GI: heartburn, indigestion, abdominal pain, nausea, vomiting, diarrhea, constipation, change in bowel habits, loss of appetite, hematemesis, melena, hematochezia.  GU: dysuria, change in color of urine, urgency or frequency, flank pain, hematuria. MSK: joint pain or swelling,  decreased range of motion. Psych: change in mood or affect, depression, anxiety, suicidal ideations, homicidal ideations. Skin: rash, itching, bruising.   Past medical history  She,  has a past medical history of Atrial fibrillation with RVR ([place]) (02/28/2017), Blindness, Cancer ([place]), Carotid artery occlusion, Dementia ([place]), Diabetes mellitus, GERD (gastroesophageal reflux disease), Hyperlipidemia, Hypertension, Hypothyroidism, Knee injury (2018), Permanent atrial fibrillation ([place]) (06/30/2017), and Stroke [facility]) (2009 and  2011).   Surgical History    Past Surgical History:  Procedure Laterality Date   ABDOMINAL HYSTERECTOMY     Arch Aortogram  03-15-2010   MASTECTOMY, PARTIAL Right    ROTATOR CUFF REPAIR Left 1981     Social History   reports that she has never smoked. She has quit using smokeless tobacco.  Her smokeless tobacco use included snuff. She reports that she does not drink alcohol or use drugs.   Family history   Her family history includes Asthma in her sister; Breast cancer in her sister; Cancer in her mother; Diabetes in her sister, sister, sister, sister, sister, sister, sister, sister, and sister; Heart disease in her father.   Allergies No Known Allergies   Home meds  Prior to Admission medications   Medication Sig Start Date End Date Taking? Authorizing Provider  acetaminophen (TYLENOL) 325 MG tablet Take 650 mg by mouth every 6 (six) hours as needed for mild pain, moderate pain, fever or headache.    Yes [provider]  albuterol (VENTOLIN HFA) 108 (90 Base) MCG/ACT inhaler Inhale 2 puffs into the lungs every 6 (six) hours as needed for wheezing or shortness of breath.   Yes [provider]  antiseptic oral rinse (BIOTENE) LIQD 5 mLs by Mouth Rinse route 2 (two) times daily.   Yes [provider]  cetirizine (ZYRTEC) 10 MG tablet Take 1 tablet (10 mg total) by mouth 2 (two) times daily. 05/31/17  Yes Kozlow, Donnamarie Poag, MD    cyproheptadine (PERIACTIN) 4 MG tablet Take one tablet twice daily 07/12/16  Yes Kozlow, Donnamarie Poag, MD  diclofenac sodium (VOLTAREN) 1 % GEL Apply 2 g topically as needed (for right hip and thigh pain). May use gel to right hip and thigh as needed for pain.    Yes [provider]  diltiazem (CARDIZEM CD) 240  MG 24 hr capsule Take 240 mg by mouth daily with breakfast.  03/15/17  Yes Strader, [place] M, PA-C  diphenhydrAMINE (BENADRYL) 25 mg capsule Take 25 mg by mouth every 6 (six) hours as needed for itching.    Yes [provider]  docusate sodium (COLACE) 100 MG capsule Take 100 mg by mouth 2 (two) times daily.   Yes [provider]  Eyelid Cleansers (OCUSOFT LID SCRUB) PADS Apply 1 each topically 2 (two) times daily.   Yes [provider]  famotidine (PEPCID) 20 MG tablet Take 20 mg by mouth 2 (two) times daily.   Yes [provider]  fluticasone (FLONASE) 50 MCG/ACT nasal spray Place 1 spray into both nostrils 2 (two) times daily.    Yes [provider]  furosemide (LASIX) 40 MG tablet Take 1 tablet (40 mg total) daily by mouth. Patient taking differently: Take 40 mg by mouth daily with breakfast.  06/23/17  Yes Minus Breeding, MD  gabapentin (NEURONTIN) 100 MG capsule Take 100 mg by mouth at bedtime.   Yes [provider]  hydroxypropyl methylcellulose / hypromellose (ISOPTO TEARS / GONIOVISC) 2.5 % ophthalmic solution Place 1 drop into both eyes 4 (four) times daily.   Yes [provider]  insulin aspart (NOVOLOG) 100 UNIT/ML injection Inject 2-11 Units into the skin 4 (four) times daily. BL. MOD. ACCUCHECK WITH S/S USE NOVOLOG INSULIN  101-150 = 2U 151-200= 3U 201-250 = 5U 251-300 = 7U 301-350 = 9U >350 = 11U CALL MD FOR BS.400 OR < 60 Taken at 6am 11:30 am 14:30 and 21:00   Yes [provider]  ketotifen (ZADITOR) 0.025 % ophthalmic solution Place 1 drop into both eyes 2 (two) times daily.    Yes [provider]  LEVEMIR FLEXTOUCH 100 UNIT/ML Pen Inject 24 Units into the skin daily at 10 pm. Patient taking differently: Inject 28 Units into the skin daily at 10 pm.  03/02/17  Yes Rosita Fire, MD  levothyroxine (SYNTHROID, LEVOTHROID) 100 MCG tablet Take 100 mcg by mouth daily before breakfast.   Yes [provider]  loratadine (CLARITIN) 10 MG tablet Take 10 mg by mouth 2 (two) times daily.   Yes [provider]  Melatonin 5 MG TABS Take 5 mg by mouth at bedtime.   Yes [provider]  mirtazapine (REMERON) 15 MG tablet Take 15 mg by mouth at bedtime.   Yes [provider]  nitroGLYCERIN (NITROSTAT) 0.4 MG SL tablet Place 0.4 mg every 5 (five) minutes as needed under the tongue for chest pain.  04/25/17  Yes [provider]  pantoprazole (PROTONIX) 40 MG tablet Take 1 tablet (40 mg total) by mouth 2 (two) times daily before a meal. Patient taking differently: Take 40 mg by mouth daily.  10/12/17  Yes Geradine Girt, DO  Polyethyl Glycol-Propyl Glycol (SYSTANE) 0.4-0.3 % GEL ophthalmic gel Place 1 application into both eyes at bedtime.   Yes [provider]  polyethylene glycol (MIRALAX / GLYCOLAX) packet Take 17 g by mouth daily as needed. Patient taking differently: Take 17 g by mouth daily as needed for mild constipation.  03/02/17  Yes Rosita Fire, MD  potassium chloride SA (K-DUR,KLOR-CON) 20 MEQ tablet Take 20 mEq by mouth daily with breakfast.  04/25/17  Yes [provider]  Rivaroxaban (XARELTO) 15 MG TABS tablet Take 15 mg by mouth daily with supper.   Yes [provider]  Saline 0.65 % (Soln) SOLN Place 1  spray into both nostrils every 2 (two) hours as needed (for dry nares).    Yes [provider]  sertraline (ZOLOFT) 50 MG tablet Take 150 mg by mouth daily.  12/22/17  Yes [provider]  simethicone (MYLICON) 0000000 MG chewable tablet Chew 125 mg by mouth every 6 (six) hours as  needed for flatulence.   Yes [provider]  sucralfate (CARAFATE) 1 g tablet Take 1 g by mouth 4 (four) times daily.  01/20/16  Yes [provider]  triamcinolone cream (KENALOG) 0.5 % Apply 1 application topically 2 (two) times daily. Apply to legs for itching/rash 10/14/16  Yes [provider]  white petrolatum (VASELINE) GEL Apply 1 application topically at bedtime.   Yes [provider]  Bepotastine Besilate (BEPREVE) 1.5 % SOLN Place 1 drop into both eyes daily.    [provider]  divalproex (DEPAKOTE) 125 MG DR tablet Take 125 mg by mouth at bedtime.    [provider]  donepezil (ARICEPT) 10 MG tablet Take 10 mg by mouth at bedtime.    [provider]  Fluvoxamine Maleate (LUVOX CR) 150 MG CP24 Take 150 mg by mouth at bedtime.    [provider]  guaiFENesin (MUCINEX) 600 MG 12 hr tablet Take 600 mg by mouth 2 (two) times daily.    [provider]  Cleda Clarks 100 UNIT/ML Mayer Masker  10/09/17   [provider]  hydrocortisone (CORTEF) 20 MG tablet  10/20/17   [provider]  levalbuterol Penne Lash) 0.63 MG/3ML nebulizer solution  10/12/17   [provider]  magic mouthwash SOLN Take 5 mLs by mouth 2 (two) times daily.    [provider]  neomycin-polymyxin-dexameth (MAXITROL) 0.1 % OINT Place 1 application into both eyes at bedtime.    [provider]  ranitidine (ZANTAC) 150 MG tablet Take 150 mg by mouth 2 (two) times daily.    [provider]  Soft Lens Products (REFRESH CONTACTS DROPS) SOLN Place 1 drop into both eyes 4 (four) times daily.    [provider]  traZODone (DESYREL) 50 MG tablet Take 25 mg at bedtime by mouth.  05/24/17   [provider]    Critical care time: 50 min.    Montey Hora, [facility] 09/05/2019, 9:59 PM

## 2019-09-05 NOTE — Significant Event (Addendum)
Rapid Response Event Note  Overview: Respiratory   Initial Focused Assessment: Nurse called with concerns of patient having low oxygen saturations. Per nurse, patient's hands and lips were cyanotic, she placed the patient on NRB 15L and tried to prone the patient when I arrived. Upon arrival, patient was proned but she was struggling to clear her oral secretions with the NRB on. I asked the we turn her back (supine), we sat her up, and transitioned her to HFNC 15L. Once we did this, her saturations were 98% on HFNC 15L for a few minutes. Patient had a pretty significant cough and she was coughing up some thin frothy secretions, she is able to cough them up and she was able to use the [place] suction as well.  After this coughing spell, her WOB was increased for several minutes and saturations dropped to 78-82%, so I placed the NRB 15L back on her and that in conjunction with the HFNC 15L - improved her saturations to 97% and RR improved from low-mid 30s to the mid 20s. Lung sounds - diminished in the bases. Her lips were no longer cyanotic but her fingertips and toes were dusky and remained.    Interventions: -- HFNC 15L + NRB 15 L   Plan of Care: -- RN to give Lasix as ordered. -- Wean oxygen as tolerated -- IS/FV -- she has a good cough and using the suction well - encourage this -- Prone if possible - might not be feasible with NRB on  -- LOW THRESHOLD to transfer to ICU for [facility] -- I truly think that the patient could benefit from [facility].  -- I recommended that PCCM be consulted  -- GOC >? Given her age and co-morbidities -- I highly recommend that [place] be discussed  -- 57 I called the NIGHT nurse for an update, per nurse, sats are only 80-86% on NRB 15L and HFNC 15L. I did not see where PCCM was consulted, so I paged the [facility] NP on call, updated the NP, will obtain ABG and TRH NP is calling the family to discuss [place].   Event Summary:  Call Time 1624 Arrival Time 1629 End Time  1800  Carely Nappier R

## 2019-09-05 NOTE — Progress Notes (Addendum)
PROGRESS NOTE    Joanna Hill  [id number] DOB: July 28, 1932 DOA: 09/05/2019 PCP: Seward Carol, MD    Brief Narrative:  86 year old lady with prior history of atrial fibrillation with RVR on Xarelto, prior CVA, dementia, type 2 diabetes presents to ED from nursing home due to hypoxia and fevers.  As per the ED notes she was diagnosed with Covid about a week ago and has been asymptomatic until the day of admission. On arrival to ED she was found to be hypoxic and is currently requiring up to 4 L of nasal cannula oxygen.  She was also found to be in atrial fibrillation with RVR and was started on Cardizem gtt.her rate is better now .  Patient seen and examined at bedside.  She had been  on 4 L of nasal cannula oxygen but had increase in O2 supplementation need on 1/20.     Assessment & Plan:   Principal Problem:   COVID-19 virus infection Active Problems:   Essential hypertension   HYPOTENSION   Altered mental status   Atrial fibrillation with RVR (HCC)   Hypothyroidism   Dementia (HCC)   Constipation   Diabetes mellitus type 2, insulin dependent (HCC)   Current use of long term anticoagulation   Multifocal pneumonia   CKD (chronic kidney disease) stage 3, GFR 30-59 ml/min   Acute respiratory failure with hypoxia probably secondary to COVID-19 viral pneumonia Nasal cannula oxygen to keep sats greater than 90%. Chest x-ray on presentation showing multifocal pneumonia. Continue to follow inflammatory markers. Continue with remdesivir and IV Decadron. Continue with bronchodilators and incentive spirometry. She is s/p Actemra for elevated CRP.  No evidence of bacterial infection at this time. Patient has normal liver enzymes. Repeat CXR with questionable edema, she received Lasix 40mg  IV x 1 time dose this am.  Continue RT consult, patient on HFNC currently. She desat this pm to 70s, proning attempted, placed on NR mask with improvement of O2 sats. If continues to have desats,  will need heated HFNC and possible transfer to the ICU.    Patient with acute hypoxic encephalopathy  on presentation with mentation improving but respiratory status has waxed and waned.   Atrial fibrillation with RVR on Cardizem gtt. which is stopped at this time.  Borderline low blood pressure measurements.  S/p Gentle hydration. Continue Xarelto for anticoagulation and Cardizem if blood pressure allows.    Stage III CKD Creatinine appears to be at baseline. Will continue to monitor. Will repeat Lasix this pm, repeat labs in am, continue Lasix if Scr and UOP stable.     Type 2 diabetes mellitus with hyperglycemia, uncontrolled probably secondary to IV Decadron. Continue home dose of Levemir and continue resistant sliding scale and add NovoLog 3 units 3 times daily AC.  CBG (last 3)  Recent Labs    09/04/19 2119 09/05/19 0817 09/05/19 1152  GLUCAP 71 107* 124*    History of prior CVA and dementia. Patient is pleasantly confused but is able to answer simple questions and appears to be oriented to person and place.    Hypothyroidism Continue with Synthroid.    GERD Stable.  DVT prophylaxis: Xarelto Code Status: full code.  Family Communication: family has been updated. Disposition Plan: pending resolution of hypoxia and completion of Remdisvir.    Consultants:  None.   Procedures: none.   Antimicrobials: none.    Subjective: Patient felt tired. On HFNC this am with decompensation this pm, on NRB mask.   Objective: Vitals:  09/05/19 0430 09/05/19 0500 09/05/19 0800 09/05/19 1254  BP:  130/83 136/79 129/75  Pulse: (!) 101  (!) 101 (!) 105  Resp:  12 13 (!) 24  Temp: 98.2 F (36.8 C)  97.7 F (36.5 C) (!) 97.4 F (36.3 C)  TempSrc: Oral  Axillary Oral  SpO2:  95% 99% 96%  Weight:      Height:        Intake/Output Summary (Last 24 hours) at 09/05/2019 1650 Last data filed at 09/05/2019 1304 Gross per 24 hour  Intake 304 ml  Output 1750 ml  Net  -1446 ml   Filed Weights   09/11/2019 2200 09/03/19 2211  Weight: 60.8 kg 60.8 kg    Examination:  General exam: Appears calm, in NAD  Respiratory system: Shallow breaths, no respiratory distress, no wheezing.  Cardiovascular system: S1 & S2 present, RRR. No pedal edema. Gastrointestinal system: Abdomen is nondistended, soft and nontender.  Central nervous system: Alert and oriented to person and to place. No focal neurological deficits. Extremities: Symmetric 5 x 5 power. Skin: No rashes, lesions or ulcers Psychiatry:  Mood & affect appropriate.     Data Reviewed: I have personally reviewed following labs and imaging studies  CBC: Recent Labs  Lab 09/12/2019 1956 09/03/19 0559 09/04/19 0701 09/05/19 0603  WBC 8.9 7.6 10.1 7.2  NEUTROABS 8.1* 7.1 9.2* 6.6  HGB 11.6* 10.6* 11.5* 12.9  HCT 36.1 34.1* 36.1 40.2  MCV 88.5 90.5 86.8 87.2  PLT 187 157 215 [id number]   Basic Metabolic Panel: Recent Labs  Lab 09/04/2019 1956 09/03/19 0559 09/04/19 0701 09/05/19 0603  NA 138 139 140 142  K 3.7 3.9 4.3 3.8  CL 104 108 105 107  CO2 23 22 23 23   GLUCOSE 257* 341* 220* 99  BUN 28* 34* 57* 56*  CREATININE 1.23* 1.23* 1.44* 1.18*  CALCIUM 8.6* 8.2* 9.1 9.4   GFR: Estimated Creatinine Clearance: 31.4 mL/min (A) (by C-G formula based on SCr of 1.18 mg/dL (H)). Liver Function Tests: Recent Labs  Lab 08/29/2019 1956 09/03/19 0559 09/04/19 0701 09/05/19 0603  AST 22 17 16 22   ALT 12 11 11 12   ALKPHOS 84 80 77 82  BILITOT 0.9 0.7 0.5 0.5  PROT 6.7 6.1* 5.9* 6.7  ALBUMIN 3.2* 2.8* 2.6* 2.8*   No results for input(s): LIPASE, AMYLASE in the last 168 hours. No results for input(s): AMMONIA in the last 168 hours. Coagulation Profile: Recent Labs  Lab 08/23/2019 1956  INR 4.4*   Cardiac Enzymes: No results for input(s): CKTOTAL, CKMB, CKMBINDEX, TROPONINI in the last 168 hours. BNP (last 3 results) No results for input(s): PROBNP in the last 8760 hours. HbA1C: Recent Labs     09/03/19 0559  HGBA1C 8.6*   CBG: Recent Labs  Lab 09/04/19 1137 09/04/19 1629 09/04/19 2119 09/05/19 0817 09/05/19 1152  GLUCAP 261* 133* 71 107* 124*   Lipid Profile: Recent Labs    09/06/2019 1956  TRIG 73   Thyroid Function Tests: Recent Labs    09/10/2019 2226  TSH 0.619   Anemia Panel: Recent Labs    08/18/2019 1956  FERRITIN 258   Sepsis Labs: Recent Labs  Lab 08/19/2019 1911 08/29/2019 1956  PROCALCITON  --  0.31  LATICACIDVEN 1.7  --     Recent Results (from the past 240 hour(s))  Blood Culture (routine x 2)     Status: None (Preliminary result)   Collection Time: 08/19/2019  7:58 PM   Specimen: BLOOD  Result  Value Ref Range Status   Specimen Description   Final    BLOOD BLOOD LEFT FOREARM Performed at [facility] [address], [place], [place] [address]    Special Requests   Final    BOTTLES DRAWN AEROBIC AND ANAEROBIC Blood Culture adequate volume Performed at [facility] [address], [place], [place] [address]    Culture   Final    NO GROWTH 2 DAYS Performed at [facility] [address] [place], [place] [address]    Report Status PENDING  Incomplete  MRSA PCR Screening     Status: None   Collection Time: 09/03/19 12:54 PM   Specimen: Nasopharyngeal  Result Value Ref Range Status   MRSA by PCR NEGATIVE NEGATIVE Final    Comment:        The GeneXpert MRSA Assay (FDA approved for NASAL specimens only), is one component of a comprehensive MRSA colonization surveillance program. It is not intended to diagnose MRSA infection nor to guide or monitor treatment for MRSA infections. Performed at [facility], [place] [address] [place], [place] [address]          Radiology Studies: DG CHEST PORT 1 VIEW  Result Date: 09/05/2019 CLINICAL DATA:  Shortness of breath. EXAM: PORTABLE CHEST 1 VIEW COMPARISON:  09/01/2019. FINDINGS: Stable cardiomegaly. Prominent progressive multifocal  bilateral pulmonary infiltrates. No pleural effusion or pneumothorax. Coarse benign-appearing calcification left breast. No acute bony abnormality. Aortic and visceral atherosclerotic vascular calcification. IMPRESSION: Prominent progressive multifocal bilateral pulmonary infiltrates. Electronically Signed   By: Marcello Moores  Register   On: 09/05/2019 08:05        Scheduled Meds:  vitamin C  500 mg Oral Daily   dexamethasone  6 mg Oral Q24H   docusate  100 mg Oral BID   Gerhardt's butt cream   Topical BID   insulin aspart  0-15 Units Subcutaneous TID WC   insulin detemir  24 Units Subcutaneous Daily   levothyroxine  100 mcg Oral Q0600   magic mouthwash  5 mL Oral BID   pantoprazole  40 mg Oral BID AC   Rivaroxaban  15 mg Oral Q supper   zinc sulfate  220 mg Oral Daily   Continuous Infusions:  remdesivir 100 mg in NS 100 mL 100 mg (09/05/19 0946)     LOS: 3 days   Time spent: 35 minutes    Blain Pais, MD [facility] 09/05/2019, 4:50 PM

## 2019-09-05 NOTE — Progress Notes (Addendum)
ANTICOAGULATION CONSULT NOTE - Initial Consult  Pharmacy Consult for Heparin Indication: atrial fibrillation (CHADS2VASc = 8)   No Known Allergies  Patient Measurements: Height: 5\' 5"  (165.1 cm) Weight: 134 lb 0.6 oz (60.8 kg) IBW/kg (Calculated) : 57 Heparin Dosing Weight: 60.8 kg  Vital Signs: Temp: 97.4 F (36.3 C) (01/21 1952) Temp Source: Axillary (01/21 1952) BP: 102/58 (01/21 2142) Pulse Rate: 113 (01/21 2142)  Labs: Recent Labs    09/03/19 0559 09/03/19 0559 09/04/19 0701 09/05/19 0603  HGB 10.6*   < > 11.5* 12.9  HCT 34.1*  --  36.1 40.2  PLT 157  --  215 247  CREATININE 1.23*  --  1.44* 1.18*   < > = values in this interval not displayed.    Estimated Creatinine Clearance: 31.4 mL/min (A) (by C-G formula based on SCr of 1.18 mg/dL (H)).   Medical History: Past Medical History:  Diagnosis Date  . Atrial fibrillation with RVR ([place]) 02/28/2017  . Blindness   . Cancer ([place])    breast  . Carotid artery occlusion   . Dementia ([place])   . Diabetes mellitus   . GERD (gastroesophageal reflux disease)   . Hyperlipidemia   . Hypertension   . Hypothyroidism   . Knee injury 2018  . Permanent atrial fibrillation ([place]) 06/30/2017  . Stroke [facility]) 2009 and  2011   difficulty with swallowing   Assessment: 86 yo W on rivaroxaban PTA for Afib (CHADS2VASc = 8). Last dose rivaroxaban given 1/21 @1800  before transfer to ICU. Changed to heparin given likely inability to take PO with HFNC and possible intubation. Will start heparin tomorrow as rivaroxaban was just given earlier tonight.   Goal of Therapy:  INR 2-3 Heparin level 0.3-0.7 units/ml Monitor platelets by anticoagulation protocol: Yes   Plan:  Start heparin 900 units/hr tomorrow at noon, no bolus F/u 8 hr aPTT F/u aPTT until correlates with HL Monitor daily aPTT, HL, CBC, plt Monitor for signs/symptoms of bleeding  F/u restart rivaroxaban    Benetta Spar, PharmD, BCPS, BCCP Clinical  Pharmacist  Please check AMION for all [place] phone numbers After 10:00 PM, call [place]

## 2019-09-05 NOTE — Progress Notes (Signed)
   09/05/19 2015  Family/Significant Other Communication  Family/Significant Other Update Updated;Called   Daughter in law [name] and son, Lennette Bihari called for an update. Both family members updated on patient being on non-rebreather at 15L and nasal canulla at 15L. Both family members updated that provider will be calling to discuss patient's current situation. Both family members spoke with patient via speaker phone.

## 2019-09-06 DIAGNOSIS — J9601 Acute respiratory failure with hypoxia: Secondary | ICD-10-CM

## 2019-09-06 LAB — COMPREHENSIVE METABOLIC PANEL
ALT: 14 U/L (ref 0–44)
AST: 37 U/L (ref 15–41)
Albumin: 2.8 g/dL — ABNORMAL LOW (ref 3.5–5.0)
Alkaline Phosphatase: 74 U/L (ref 38–126)
Anion gap: 12 (ref 5–15)
BUN: 56 mg/dL — ABNORMAL HIGH (ref 8–23)
CO2: 23 mmol/L (ref 22–32)
Calcium: 9 mg/dL (ref 8.9–10.3)
Chloride: 107 mmol/L (ref 98–111)
Creatinine, Ser: 1.26 mg/dL — ABNORMAL HIGH (ref 0.44–1.00)
GFR calc Af Amer: 45 mL/min — ABNORMAL LOW (ref >60)
GFR calc non Af Amer: 39 mL/min — ABNORMAL LOW (ref >60)
Glucose, Bld: 158 mg/dL — ABNORMAL HIGH (ref 70–99)
Potassium: 3.8 mmol/L (ref 3.5–5.1)
Sodium: 142 mmol/L (ref 135–145)
Total Bilirubin: 0.8 mg/dL (ref 0.3–1.2)
Total Protein: 5.9 g/dL — ABNORMAL LOW (ref 6.5–8.1)

## 2019-09-06 LAB — DIC (DISSEMINATED INTRAVASCULAR COAGULATION)PANEL
D-Dimer, Quant: 2.05 ug/mL-FEU — ABNORMAL HIGH (ref 0.00–0.50)
Fibrinogen: 699 mg/dL — ABNORMAL HIGH (ref 210–475)
INR: 1.8 — ABNORMAL HIGH (ref 0.8–1.2)
Platelets: 241 10*3/uL (ref 150–400)
Prothrombin Time: 20.7 seconds — ABNORMAL HIGH (ref 11.4–15.2)
Smear Review: NONE SEEN
aPTT: 43 seconds — ABNORMAL HIGH (ref 24–36)

## 2019-09-06 LAB — GLUCOSE, CAPILLARY
Glucose-Capillary: 227 mg/dL — ABNORMAL HIGH (ref 70–99)
Glucose-Capillary: 174 mg/dL — ABNORMAL HIGH (ref 70–99)
Glucose-Capillary: 166 mg/dL — ABNORMAL HIGH (ref 70–99)
Glucose-Capillary: 179 mg/dL — ABNORMAL HIGH (ref 70–99)

## 2019-09-06 LAB — CBC WITH DIFFERENTIAL/PLATELET
Abs Immature Granulocytes: 0.04 10*3/uL (ref 0.00–0.07)
Basophils Absolute: 0 10*3/uL (ref 0.0–0.1)
Basophils Relative: 0 %
Eosinophils Absolute: 0 10*3/uL (ref 0.0–0.5)
Eosinophils Relative: 1 %
HCT: 42 % (ref 36.0–46.0)
Hemoglobin: 13.6 g/dL (ref 12.0–15.0)
Immature Granulocytes: 1 %
Lymphocytes Relative: 7 %
Lymphs Abs: 0.4 10*3/uL — ABNORMAL LOW (ref 0.7–4.0)
MCH: 28.1 pg (ref 26.0–34.0)
MCHC: 32.4 g/dL (ref 30.0–36.0)
MCV: 86.8 fL (ref 80.0–100.0)
Monocytes Absolute: 0.2 10*3/uL (ref 0.1–1.0)
Monocytes Relative: 3 %
Neutro Abs: 5.5 10*3/uL (ref 1.7–7.7)
Neutrophils Relative %: 88 %
Platelets: 250 10*3/uL (ref 150–400)
RBC: 4.84 MIL/uL (ref 3.87–5.11)
RDW: 14.4 % (ref 11.5–15.5)
WBC: 6.2 10*3/uL (ref 4.0–10.5)
nRBC: 0 % (ref 0.0–0.2)

## 2019-09-06 LAB — PHOSPHORUS: Phosphorus: 4.1 mg/dL (ref 2.5–4.6)

## 2019-09-06 LAB — APTT
aPTT: >200 seconds (ref 24–36)
aPTT: 121 seconds — ABNORMAL HIGH (ref 24–36)

## 2019-09-06 LAB — C-REACTIVE PROTEIN: CRP: 8.3 mg/dL — ABNORMAL HIGH (ref <1.0)

## 2019-09-06 LAB — MAGNESIUM: Magnesium: 1.9 mg/dL (ref 1.7–2.4)

## 2019-09-06 MED ORDER — CHLORHEXIDINE GLUCONATE CLOTH 2 % EX PADS
6.0000 | MEDICATED_PAD | Freq: Every day | CUTANEOUS | Status: DC
  Administered 2019-09-06 – 2019-09-07 (×2): 6 via TOPICAL

## 2019-09-06 MED ORDER — DIVALPROEX SODIUM 125 MG PO CSDR
125.0000 mg | DELAYED_RELEASE_CAPSULE | Freq: Every day | ORAL | Status: DC
  Administered 2019-09-06: 125 mg via ORAL
  Filled 2019-09-06: qty 1

## 2019-09-06 MED ORDER — MAGNESIUM SULFATE 2 GM/50ML IV SOLN
2.0000 g | Freq: Once | INTRAVENOUS | Status: AC
  Administered 2019-09-06: 2 g via INTRAVENOUS
  Filled 2019-09-06: qty 50

## 2019-09-06 MED ORDER — POTASSIUM CHLORIDE CRYS ER 20 MEQ PO TBCR
40.0000 meq | EXTENDED_RELEASE_TABLET | Freq: Once | ORAL | Status: AC
  Administered 2019-09-06: 40 meq via ORAL
  Filled 2019-09-06: qty 2

## 2019-09-06 NOTE — Progress Notes (Signed)
Spoke with Maryann Conners, daughter in law, who spoke to her husband, patient's son.  They do not wish for any further escalation of care.  Just medical therapy at this point.  Will make patient DNR.  Rush Farmer, M.D. [facility]monary/Critical Care Medicine.

## 2019-09-06 NOTE — Progress Notes (Signed)
ANTICOAGULATION CONSULT NOTE - Initial Consult  Pharmacy Consult for Heparin Indication: atrial fibrillation (CHADS2VASc = 8)  Patient Measurements: Height: 5\' 5"  (165.1 cm) Weight: 131 lb 9.8 oz (59.7 kg) IBW/kg (Calculated) : 57 Heparin Dosing Weight: 60.8 kg  Vital Signs: Temp: 96.6 F (35.9 C) (01/22 2138) Temp Source: Axillary (01/22 2138) BP: 106/60 (01/22 2100) Pulse Rate: 105 (01/22 2100)  Labs:  Recent Labs    09/04/19 0701 09/04/19 0701 09/05/19 0603 09/06/19 0011 09/06/19 2000 09/06/19 2140  HGB 11.5*   < > 12.9 13.6  --   --   HCT 36.1  --  40.2 42.0  --   --   PLT 215  --  247 241  250  --   --   APTT  --   --   --  43* >200* 121*  LABPROT  --   --   --  20.7*  --   --   INR  --   --   --  1.8*  --   --   CREATININE 1.44*  --  1.18* 1.26*  --   --    < > = values in this interval not displayed.     Estimated Creatinine Clearance: 29.4 mL/min (A) (by C-G formula based on SCr of 1.26 mg/dL (H)).   Assessment: 86 yo W on rivaroxaban PTA for Afib (CHADS2VASc = 8). Last dose rivaroxaban given 1/21 @1800  before transfer to ICU. Changed to heparin given likely inability to take PO with HFNC and possible intubation.   Heparin started 18 hr from last dose of rivaroxaban. Following aPTTs, first apTT >200. Drawn from same arm as heparin b/c cannot draw from other arm per RN. Asked RN to coordinate with phlebotomy so heparin can be held for 15 min before repeat stat draw, down to 121. No bleeding.    Goal of Therapy:  INR 2-3 Heparin level 0.3-0.7 units/ml  APTT 66- 102 sec Monitor platelets by anticoagulation protocol: Yes   Plan:  Decr heparin 700 units/hr  F/u aPTT until correlates with HL Monitor daily aPTT, HL, CBC, plt Monitor for signs/symptoms of bleeding  F/u restart rivaroxaban    Benetta Spar, PharmD, BCPS, BCCP Clinical Pharmacist  Please check AMION for all [place] phone numbers After 10:00 PM, call [place]

## 2019-09-06 NOTE — Progress Notes (Signed)
NAME:  Joanna Hill, MRN:  [id number], DOB:  30-Nov-1931, LOS: 4 ADMISSION DATE:  08/30/2019, CONSULTATION DATE:  09/05/19 REFERRING MD: Hayes Ludwig  CHIEF COMPLAINT:  Hypoxia   Brief History   Joanna Hill is a 86 y.o. female who was admitted 1/18 with acute hypoxic respiratory failure 2/2 COVID PNA.  History of present illness   Joanna Hill is a 86 y.o. female who has a PMH including but not limited to A.fib with RVR (on xarelto, DM, prior CVA, dementa (see "past medical history" for rest).  She resides at [facility] home.  She presented to [facility] ED 1/18 with hypoxia.  She had apparently been diagnosed with COVID 1 week prior but was initially asymptomatic  On day of presentation, she was found to be hypoxic, febrile, and somewhat altered so was sent to the ED.    She was admitted by [facility] to the progressive floor and was initially requiring 4L O2.  On 1/21, she had increase in O2 requirements to 15L HFNC and also had NRB placed.  She had sats in low to mid 90's at rest with dips into the 70's with any exertion.  There was concern given frequent desaturations; therefore, PCCM called in consultation.  Per RRT notes from earlier, pt had cyanotic lips and fingers at one point earlier in the afternoon.  Heated HFNC was recommended but apparently was not able to be set up on the floor pt was located on.  ABG on 15L regular HFNC with significant hypoxia (7.44 / 35 / 50). Prior to my arrival overnight, I asked for [facility] to be set up while awaiting ICU transfer.  On [place] at 100% with 30L flow, sats were 95% - 96%.   Past Medical History  has HYPERLIPIDEMIA; Essential hypertension; CORONARY ARTERY DISEASE; HYPOTENSION; Altered mental status; DYSPHAGIA; Carotid artery disease ([place]); Hyperkalemia; Atrial fibrillation with RVR ([place]); Hypothyroidism; Hypertension; History of CVA in adulthood; Dementia ([place]); Constipation; GERD (gastroesophageal reflux disease); Diabetes mellitus type 2, insulin dependent ([place]);  Paroxysmal atrial fibrillation ([place]); Current use of long term anticoagulation; Leg swelling; Acute on chronic diastolic heart failure ([place]); Permanent atrial fibrillation ([place]); Chest pain; Chronic diastolic CHF (congestive heart failure) ([place]); COVID-19 virus infection; Multifocal pneumonia; and CKD (chronic kidney disease) stage 3, GFR 30-59 ml/min on their problem list.  Significant Hospital Events   1/18 > admit. 1/21 > transfer to ICU.  Consults:  PCCM.  Procedures:  None.  Significant Diagnostic Tests:  CXR 1/21 > multifocal infiltrates.  Micro Data:  Blood 1/18 >  COVID reported positive from outside facility.  Antimicrobials:  None.   Interim history/subjective:  Transferred to the ICU overnight for evolving hypoxemic respiratory failure  Objective:  Blood pressure 103/69, pulse (!) 103, temperature (!) 97.4 F (36.3 C), temperature source Oral, resp. rate 19, height 5\' 5"  (1.651 m), weight 59.7 kg, SpO2 91 %.    FiO2 (%):  [60 %-100 %] 60 %   Intake/Output Summary (Last 24 hours) at 09/06/2019 0843 Last data filed at 09/06/2019 0700 Gross per 24 hour  Intake 654.99 ml  Output 1150 ml  Net -495.01 ml   Filed Weights   09/03/2019 2200 09/03/19 2211 09/05/19 2330  Weight: 60.8 kg 60.8 kg 59.7 kg    Examination: General: Chronically ill appearing female, moderate respiratory distress Neuro: Alert and oriented, moving all ext to commands HEENT: [place]/AT, PERRL, EOM-I and MMM, HFNC in place Cardiovascular: IRIR, tachy, Nl S1/S2 and -M/R/G Lungs: Coarse BS diffusely Abdomen:  Soft, NT, ND and +BS Musculoskeletal: No gross deformities, no edema.  Skin: Intact, warm, no rashes.  I reviewed CXR myself, worsening infiltrate noted  Discussed with bedside RN and RT  Assessment & Plan:   Acute hypoxic respiratory failure 2/2 COVID PNA - increasing O2 requirements since admission and now up to 100% HHFNC at 30L flow.  She received 1 dose actemra on admission. - Hold in  the ICU for closer observation - Titrate O2 for sat of 85-88% as long as not in respiratory distress - Intubate only if signs of distress (accessory muscle use, nasal flaring, paradoxical pattern, etc). - Continue decadron and remdesivir. - Continue vitamin C and zinc. - Continue BD's, bronchial hygiene. -   Hx A.fib with RVR (on xarelto) - required cardizem briefly in ED. - Continue home xarelto, cardizem - Hold lasix given deteriorating renal function and improving oxygenation (slowly)  AoCKD. - Continue supportive care. - Hold lasix  Hx DM. - Continue SSI and levemir.  Hx hypothyroidism. - Continue home synthroid.  Hx dementia, prior CVA. - Continue supportive care.  If deteriorates will need to have a discussion with son regarding code status as intubation in elderly COVID patient rarely ends well.  But will monitor for now.  Best Practice:  Diet: carb mod. Pain/Anxiety/Delirium protocol (if indicated): N/A. VAP protocol (if indicated): N/A. DVT prophylaxis: Xarelto. GI prophylaxis: N/A. Glucose control: SSI, levemir. Mobility: Bedrest. Code Status: Full. Family Communication: Daughter in law (Ayliah Flick who works with PCCM) updated overnight 1/21 by Dr. Valeta Harms. Disposition: ICU.  Labs   CBC: Recent Labs  Lab 08/25/2019 1956 09/03/19 0559 09/04/19 0701 09/05/19 0603 09/06/19 0011  WBC 8.9 7.6 10.1 7.2 6.2  NEUTROABS 8.1* 7.1 9.2* 6.6 5.5  HGB 11.6* 10.6* 11.5* 12.9 13.6  HCT 36.1 34.1* 36.1 40.2 42.0  MCV 88.5 90.5 86.8 87.2 86.8  PLT 187 157 215 247 241  [id number]   Basic Metabolic Panel: Recent Labs  Lab 09/12/2019 1956 09/03/19 0559 09/04/19 0701 09/05/19 0603 09/06/19 0011  NA 138 139 140 142 142  K 3.7 3.9 4.3 3.8 3.8  CL 104 108 105 107 107  CO2 23 22 23 23 23   GLUCOSE 257* 341* 220* 99 158*  BUN 28* 34* 57* 56* 56*  CREATININE 1.23* 1.23* 1.44* 1.18* 1.26*  CALCIUM 8.6* 8.2* 9.1 9.4 9.0  MG  --   --   --   --  1.9  PHOS  --   --   --   --  4.1    GFR: Estimated Creatinine Clearance: 29.4 mL/min (A) (by C-G formula based on SCr of 1.26 mg/dL (H)). Recent Labs  Lab 08/31/2019 1911 08/21/2019 1956 09/13/2019 1956 09/03/19 0559 09/04/19 0701 09/05/19 0603 09/06/19 0011  PROCALCITON  --  0.31  --   --   --   --   --   WBC  --  8.9   < > 7.6 10.1 7.2 6.2  LATICACIDVEN 1.7  --   --   --   --   --   --    < > = values in this interval not displayed.   Liver Function Tests: Recent Labs  Lab 08/18/2019 1956 09/03/19 0559 09/04/19 0701 09/05/19 0603 09/06/19 0011  AST 22 17 16 22  37  ALT 12 11 11 12 14   ALKPHOS 84 80 77 82 74  BILITOT 0.9 0.7 0.5 0.5 0.8  PROT 6.7 6.1* 5.9* 6.7 5.9*  ALBUMIN 3.2* 2.8* 2.6* 2.8* 2.8*  No results for input(s): LIPASE, AMYLASE in the last 168 hours. No results for input(s): AMMONIA in the last 168 hours. ABG    Component Value Date/Time   PHART 7.445 09/05/2019 2038   PCO2ART 35.6 09/05/2019 2038   PO2ART 50.4 (L) 09/05/2019 2038   HCO3 24.3 09/05/2019 2038   TCO2 27 07/11/2013 1218   O2SAT 84.6 09/05/2019 2038    Coagulation Profile: Recent Labs  Lab 09/04/2019 1956 09/06/19 0011  INR 4.4* 1.8*   Cardiac Enzymes: No results for input(s): CKTOTAL, CKMB, CKMBINDEX, TROPONINI in the last 168 hours. HbA1C: Hgb A1c MFr Bld  Date/Time Value Ref Range Status  09/03/2019 05:59 AM 8.6 (H) 4.8 - 5.6 % Final    Comment:    (NOTE) Pre diabetes:          5.7%-6.4% Diabetes:              >6.4% Glycemic control for   <7.0% adults with diabetes   02/28/2017 04:26 PM 10.6 (H) 4.8 - 5.6 % Final    Comment:    (NOTE)         Pre-diabetes: 5.7 - 6.4         Diabetes: >6.4         Glycemic control for adults with diabetes: <7.0    CBG: Recent Labs  Lab 09/05/19 0817 09/05/19 1152 09/05/19 1712 09/05/19 2110 09/06/19 0830  GLUCAP 107* 124* 90 93 227*   The patient is critically ill with multiple organ systems failure and requires high complexity decision making for assessment and  support, frequent evaluation and titration of therapies, application of advanced monitoring technologies and extensive interpretation of multiple databases.   Critical Care Time devoted to patient care services described in this note is  32  Minutes. This time reflects time of care of this signee Dr Jennet Maduro. This critical care time does not reflect procedure time, or teaching time or supervisory time of PA/NP/Med student/Med Resident etc but could involve care discussion time.  Rush Farmer, M.D. [facility] Pulmonary/Critical Care Medicine.

## 2019-09-07 DIAGNOSIS — R4182 Altered mental status, unspecified: Secondary | ICD-10-CM

## 2019-09-07 LAB — CBC WITH DIFFERENTIAL/PLATELET
Abs Immature Granulocytes: 0.07 10*3/uL (ref 0.00–0.07)
Basophils Absolute: 0 10*3/uL (ref 0.0–0.1)
Basophils Relative: 0 %
Eosinophils Absolute: 0.1 10*3/uL (ref 0.0–0.5)
Eosinophils Relative: 1 %
HCT: 43.4 % (ref 36.0–46.0)
Hemoglobin: 14.1 g/dL (ref 12.0–15.0)
Immature Granulocytes: 1 %
Lymphocytes Relative: 7 %
Lymphs Abs: 0.5 10*3/uL — ABNORMAL LOW (ref 0.7–4.0)
MCH: 28.1 pg (ref 26.0–34.0)
MCHC: 32.5 g/dL (ref 30.0–36.0)
MCV: 86.5 fL (ref 80.0–100.0)
Monocytes Absolute: 0.2 10*3/uL (ref 0.1–1.0)
Monocytes Relative: 3 %
Neutro Abs: 6 10*3/uL (ref 1.7–7.7)
Neutrophils Relative %: 88 %
Platelets: 290 10*3/uL (ref 150–400)
RBC: 5.02 MIL/uL (ref 3.87–5.11)
RDW: 14.6 % (ref 11.5–15.5)
WBC: 6.9 10*3/uL (ref 4.0–10.5)
nRBC: 0 % (ref 0.0–0.2)

## 2019-09-07 LAB — COMPREHENSIVE METABOLIC PANEL
ALT: 15 U/L (ref 0–44)
AST: 27 U/L (ref 15–41)
Albumin: 3.1 g/dL — ABNORMAL LOW (ref 3.5–5.0)
Alkaline Phosphatase: 91 U/L (ref 38–126)
Anion gap: 13 (ref 5–15)
BUN: 59 mg/dL — ABNORMAL HIGH (ref 8–23)
CO2: 24 mmol/L (ref 22–32)
Calcium: 9.4 mg/dL (ref 8.9–10.3)
Chloride: 109 mmol/L (ref 98–111)
Creatinine, Ser: 1.2 mg/dL — ABNORMAL HIGH (ref 0.44–1.00)
GFR calc Af Amer: 48 mL/min — ABNORMAL LOW (ref >60)
GFR calc non Af Amer: 41 mL/min — ABNORMAL LOW (ref >60)
Glucose, Bld: 203 mg/dL — ABNORMAL HIGH (ref 70–99)
Potassium: 4.4 mmol/L (ref 3.5–5.1)
Sodium: 146 mmol/L — ABNORMAL HIGH (ref 135–145)
Total Bilirubin: 0.5 mg/dL (ref 0.3–1.2)
Total Protein: 6.4 g/dL — ABNORMAL LOW (ref 6.5–8.1)

## 2019-09-07 LAB — APTT: aPTT: 115 seconds — ABNORMAL HIGH (ref 24–36)

## 2019-09-07 LAB — MAGNESIUM: Magnesium: 2.6 mg/dL — ABNORMAL HIGH (ref 1.7–2.4)

## 2019-09-07 LAB — GLUCOSE, CAPILLARY: Glucose-Capillary: 185 mg/dL — ABNORMAL HIGH (ref 70–99)

## 2019-09-07 LAB — C-REACTIVE PROTEIN: CRP: 5.4 mg/dL — ABNORMAL HIGH (ref <1.0)

## 2019-09-07 LAB — D-DIMER, QUANTITATIVE: D-Dimer, Quant: 1.89 ug/mL-FEU — ABNORMAL HIGH (ref 0.00–0.50)

## 2019-09-07 LAB — HEPARIN LEVEL (UNFRACTIONATED): Heparin Unfractionated: 0.67 IU/mL (ref 0.30–0.70)

## 2019-09-07 LAB — PHOSPHORUS: Phosphorus: 4.3 mg/dL (ref 2.5–4.6)

## 2019-09-07 MED ORDER — POTASSIUM CHLORIDE CRYS ER 20 MEQ PO TBCR: 40.0000 meq | EXTENDED_RELEASE_TABLET | Freq: Three times a day (TID) | ORAL | Status: DC

## 2019-09-07 MED ORDER — GLYCOPYRROLATE 0.2 MG/ML IJ SOLN: 0.2000 mg | INTRAMUSCULAR | Status: DC | PRN

## 2019-09-07 MED ORDER — ONDANSETRON 4 MG PO TBDP: 4.0000 mg | ORAL_TABLET | Freq: Four times a day (QID) | ORAL | Status: DC | PRN

## 2019-09-07 MED ORDER — MORPHINE SULFATE (PF) 2 MG/ML IV SOLN
INTRAVENOUS | Status: AC
  Filled 2019-09-07: qty 1

## 2019-09-07 MED ORDER — HALOPERIDOL LACTATE 2 MG/ML PO CONC
0.5000 mg | ORAL | Status: DC | PRN
  Filled 2019-09-07 (×2): qty 0.3

## 2019-09-07 MED ORDER — MAGNESIUM SULFATE 2 GM/50ML IV SOLN
2.0000 g | Freq: Once | INTRAVENOUS | Status: AC
  Administered 2019-09-07: 12:00:00 2 g via INTRAVENOUS
  Filled 2019-09-07: qty 50

## 2019-09-07 MED ORDER — GLYCOPYRROLATE 1 MG PO TABS: 1.0000 mg | ORAL_TABLET | ORAL | Status: DC | PRN

## 2019-09-07 MED ORDER — POLYVINYL ALCOHOL 1.4 % OP SOLN
1.0000 [drp] | Freq: Four times a day (QID) | OPHTHALMIC | Status: DC | PRN
  Filled 2019-09-07: qty 15

## 2019-09-07 MED ORDER — ACETAMINOPHEN 650 MG RE SUPP: 650.0000 mg | Freq: Four times a day (QID) | RECTAL | Status: DC | PRN

## 2019-09-07 MED ORDER — ACETAMINOPHEN 325 MG PO TABS: 650.0000 mg | ORAL_TABLET | Freq: Four times a day (QID) | ORAL | Status: DC | PRN

## 2019-09-07 MED ORDER — FUROSEMIDE 10 MG/ML IJ SOLN
40.0000 mg | Freq: Three times a day (TID) | INTRAMUSCULAR | Status: DC
  Administered 2019-09-07: 40 mg via INTRAVENOUS
  Filled 2019-09-07: qty 4

## 2019-09-07 MED ORDER — MORPHINE SULFATE (PF) 2 MG/ML IV SOLN
2.0000 mg | INTRAVENOUS | Status: DC | PRN
  Administered 2019-09-07: 13:00:00 2 mg via INTRAVENOUS

## 2019-09-07 MED ORDER — MORPHINE 100MG IN NS 100ML (1MG/ML) PREMIX INFUSION
1.0000 mg/h | INTRAVENOUS | Status: DC
  Administered 2019-09-07: 18:00:00 1 mg/h via INTRAVENOUS
  Filled 2019-09-07: qty 100

## 2019-09-07 MED ORDER — BIOTENE DRY MOUTH MT LIQD: 15.0000 mL | OROMUCOSAL | Status: DC | PRN

## 2019-09-07 MED ORDER — ONDANSETRON HCL 4 MG/2ML IJ SOLN
INTRAMUSCULAR | Status: AC
  Administered 2019-09-07: 4 mg
  Filled 2019-09-07: qty 2

## 2019-09-07 MED ORDER — ONDANSETRON HCL 4 MG/2ML IJ SOLN: 4.0000 mg | Freq: Four times a day (QID) | INTRAMUSCULAR | Status: DC | PRN

## 2019-09-07 MED ORDER — MORPHINE BOLUS VIA INFUSION
2.0000 mg | INTRAVENOUS | Status: DC | PRN
  Administered 2019-09-07: 18:00:00 2 mg via INTRAVENOUS
  Filled 2019-09-07: qty 2

## 2019-09-07 MED ORDER — HALOPERIDOL 0.5 MG PO TABS: 0.5000 mg | ORAL_TABLET | ORAL | Status: DC | PRN

## 2019-09-07 MED ORDER — HALOPERIDOL LACTATE 5 MG/ML IJ SOLN: 0.5000 mg | INTRAMUSCULAR | Status: DC | PRN

## 2019-09-07 NOTE — Progress Notes (Signed)
NAME:  Joanna Hill, MRN:  [id number], DOB:  [birth year], LOS: 5 ADMISSION DATE:  08/23/2019, CONSULTATION DATE:  09/05/19 REFERRING MD: Hayes Ludwig  CHIEF COMPLAINT:  Hypoxia   Brief History   Joanna Hill is a 86 y.o. female who was admitted 1/18 with acute hypoxic respiratory failure 2/2 COVID PNA.  History of present illness   Joanna Hill is a 86 y.o. female who has a PMH including but not limited to A.fib with RVR (on xarelto, DM, prior CVA, dementa (see "past medical history" for rest).  She resides at [place] home.  She presented to [facility] ED 1/18 with hypoxia.  She had apparently been diagnosed with COVID 1 week prior but was initially asymptomatic  On day of presentation, she was found to be hypoxic, febrile, and somewhat altered so was sent to the ED.    She was admitted by [facility] to the progressive floor and was initially requiring 4L O2.  On 1/21, she had increase in O2 requirements to 15L HFNC and also had NRB placed.  She had sats in low to mid 90's at rest with dips into the 70's with any exertion.  There was concern given frequent desaturations; therefore, PCCM called in consultation.  Per RRT notes from earlier, pt had cyanotic lips and fingers at one point earlier in the afternoon.  Heated HFNC was recommended but apparently was not able to be set up on the floor pt was located on.  ABG on 15L regular HFNC with significant hypoxia (7.44 / 35 / 50). Prior to my arrival overnight, I asked for [facility] to be set up while awaiting ICU transfer.  On [place] at 100% with 30L flow, sats were 95% - 96%.   Past Medical History  has HYPERLIPIDEMIA; Essential hypertension; CORONARY ARTERY DISEASE; HYPOTENSION; Altered mental status; DYSPHAGIA; Carotid artery disease ([place]); Hyperkalemia; Atrial fibrillation with RVR ([place]); Hypothyroidism; Hypertension; History of CVA in adulthood; Dementia ([place]); Constipation; GERD (gastroesophageal reflux disease); Diabetes mellitus type 2, insulin dependent ([place]);  Paroxysmal atrial fibrillation ([place]); Current use of long term anticoagulation; Leg swelling; Acute on chronic diastolic heart failure ([place]); Permanent atrial fibrillation ([place]); Chest pain; Chronic diastolic CHF (congestive heart failure) ([employer]); COVID-19 virus infection; Multifocal pneumonia; and CKD (chronic kidney disease) stage 3, GFR 30-59 ml/min on their problem list.  Significant Hospital Events   1/18 > admit. 1/21 > transfer to ICU.  Consults:  PCCM.  Procedures:  None.  Significant Diagnostic Tests:  CXR 1/21 > multifocal infiltrates.  Micro Data:  Blood 1/18 >  COVID reported positive from outside facility.  Antimicrobials:  None.   Interim history/subjective:  Continues to have desaturation issues overnight with any activity  Objective:  Blood pressure 123/65, pulse (!) 108, temperature (!) 95.9 F (35.5 C), temperature source Axillary, resp. rate (!) 22, height 5\' 5"  (1.651 m), weight 59.7 kg, SpO2 94 %.    FiO2 (%):  [65 %-100 %] 70 %   Intake/Output Summary (Last 24 hours) at 09/07/2019 0853 Last data filed at 09/07/2019 0600 Gross per 24 hour  Intake 695.77 ml  Output 575 ml  Net 120.77 ml   Filed Weights   08/20/2019 2200 09/03/19 2211 09/05/19 2330  Weight: 60.8 kg 60.8 kg 59.7 kg    Examination: General: Chronically ill appearing female, NAD Neuro: Alert and interactive, moving all ext to command HEENT: [place]/AT, PERRL, EOM-I and MMM, HFNC in place Cardiovascular: IRIR, Nl S1/S2 and -M/R/G Lungs: Coarse diffusely Abdomen: Soft, NT, ND and +  BS Musculoskeletal: No gross deformities, no edema.  Skin: Intact, warm, no rashes.  I reviewed CXR myself, infiltrate noted  Discussed with bedside RN and RT  Assessment & Plan:   Acute hypoxic respiratory failure 2/2 COVID PNA - increasing O2 requirements since admission and now up to 100% HHFNC at 30L flow.  She received 1 dose actemra on admission. - Continue to hold in the ICU given overnight events and  O2 demand - Titrate O2 for sat of 85-88% as long as not in respiratory distress - DNR status confirmed with family conversation - Continue decadron and remdesivir, place stop dates - Continue vitamin C and zinc. - Continue BD's, bronchial hygiene.  Hx A.fib with RVR (on xarelto) - required cardizem briefly in ED. - Continue home xarelto, cardizem - Low dose lasix today  AoCKD. - Continue supportive care. - Lasix 40 mg IV q8 x2 doses  Hx DM. - Continue SSI and levemir.  Hx hypothyroidism. - Continue home synthroid.  Hx dementia, prior CVA. - Continue supportive care.  Spoke with son and daughter in law, no further escalation of care, if becomes more in respiratory distress will consider palliation  Best Practice:  Diet: carb mod. Pain/Anxiety/Delirium protocol (if indicated): N/A. VAP protocol (if indicated): N/A. DVT prophylaxis: Xarelto. GI prophylaxis: N/A. Glucose control: SSI, levemir. Mobility: Bedrest. Code Status: Full. Family Communication: Daughter in law (Dalylah Kammeyer who works with PCCM) updated overnight 1/21 by Dr. Valeta Harms. Disposition: ICU.  Labs   CBC: Recent Labs  Lab 09/03/19 0559 09/04/19 0701 09/05/19 0603 09/06/19 0011 09/07/19 0649  WBC 7.6 10.1 7.2 6.2 6.9  NEUTROABS 7.1 9.2* 6.6 5.5 6.0  HGB 10.6* 11.5* 12.9 13.6 14.1  HCT 34.1* 36.1 40.2 42.0 43.4  MCV 90.5 86.8 87.2 86.8 86.5  PLT 157 215 247 241  250 [id number]   Basic Metabolic Panel: Recent Labs  Lab 08/20/2019 1956 09/03/19 0559 09/04/19 0701 09/05/19 0603 09/06/19 0011  NA 138 139 140 142 142  K 3.7 3.9 4.3 3.8 3.8  CL 104 108 105 107 107  CO2 23 22 23 23 23   GLUCOSE 257* 341* 220* 99 158*  BUN 28* 34* 57* 56* 56*  CREATININE 1.23* 1.23* 1.44* 1.18* 1.26*  CALCIUM 8.6* 8.2* 9.1 9.4 9.0  MG  --   --   --   --  1.9  PHOS  --   --   --   --  4.1   GFR: Estimated Creatinine Clearance: 29.4 mL/min (A) (by C-G formula based on SCr of 1.26 mg/dL (H)). Recent Labs  Lab  08/19/2019 1911 08/29/2019 1956 09/03/19 0559 09/04/19 0701 09/05/19 0603 09/06/19 0011 09/07/19 0649  PROCALCITON  --  0.31  --   --   --   --   --   WBC  --  8.9   < > 10.1 7.2 6.2 6.9  LATICACIDVEN 1.7  --   --   --   --   --   --    < > = values in this interval not displayed.   Liver Function Tests: Recent Labs  Lab 09/10/2019 1956 09/03/19 0559 09/04/19 0701 09/05/19 0603 09/06/19 0011  AST 22 17 16 22  37  ALT 12 11 11 12 14   ALKPHOS 84 80 77 82 74  BILITOT 0.9 0.7 0.5 0.5 0.8  PROT 6.7 6.1* 5.9* 6.7 5.9*  ALBUMIN 3.2* 2.8* 2.6* 2.8* 2.8*   No results for input(s): LIPASE, AMYLASE in the last 168 hours. No results  for input(s): AMMONIA in the last 168 hours. ABG    Component Value Date/Time   PHART 7.445 09/05/2019 2038   PCO2ART 35.6 09/05/2019 2038   PO2ART 50.4 (L) 09/05/2019 2038   HCO3 24.3 09/05/2019 2038   TCO2 27 07/11/2013 1218   O2SAT 84.6 09/05/2019 2038    Coagulation Profile: Recent Labs  Lab 08/22/2019 1956 09/06/19 0011  INR 4.4* 1.8*   Cardiac Enzymes: No results for input(s): CKTOTAL, CKMB, CKMBINDEX, TROPONINI in the last 168 hours. HbA1C: Hgb A1c MFr Bld  Date/Time Value Ref Range Status  09/03/2019 05:59 AM 8.6 (H) 4.8 - 5.6 % Final    Comment:    (NOTE) Pre diabetes:          5.7%-6.4% Diabetes:              >6.4% Glycemic control for   <7.0% adults with diabetes   02/28/2017 04:26 PM 10.6 (H) 4.8 - 5.6 % Final    Comment:    (NOTE)         Pre-diabetes: 5.7 - 6.4         Diabetes: >6.4         Glycemic control for adults with diabetes: <7.0    CBG: Recent Labs  Lab 09/06/19 0830 09/06/19 1140 09/06/19 1553 09/06/19 2135 09/07/19 0827  GLUCAP 227* 174* 166* 179* 185*   The patient is critically ill with multiple organ systems failure and requires high complexity decision making for assessment and support, frequent evaluation and titration of therapies, application of advanced monitoring technologies and extensive  interpretation of multiple databases.   Critical Care Time devoted to patient care services described in this note is  31  Minutes. This time reflects time of care of this signee Dr Jennet Maduro. This critical care time does not reflect procedure time, or teaching time or supervisory time of PA/NP/Med student/Med Resident etc but could involve care discussion time.  Rush Farmer, M.D. [facility] Pulmonary/Critical Care Medicine.

## 2019-09-07 NOTE — Progress Notes (Signed)
Patient called out to RN station to be suctioned. This RN at the bedside suctioning patient when she started throwing up large amounts of yellow fluids. Notified Dr. Nelda Marseille of event and to give 4 mg of zofran. Patients heart rate elevated and notable work of breathing.

## 2019-09-07 NOTE — Progress Notes (Addendum)
ANTICOAGULATION CONSULT NOTE - Initial Consult  Pharmacy Consult for Heparin Indication: atrial fibrillation (CHADS2VASc = 8)  Patient Measurements: Height: 5\' 5"  (165.1 cm) Weight: 131 lb 9.8 oz (59.7 kg) IBW/kg (Calculated) : 57 Heparin Dosing Weight: 60.8 kg  Vital Signs: Temp: 95.9 F (35.5 C) (01/23 0804) Temp Source: Axillary (01/23 0804) BP: 117/69 (01/23 1000) Pulse Rate: 99 (01/23 1000)  Labs:  Recent Labs    09/04/19 0701 09/04/19 0701 09/05/19 0603 09/06/19 0011 09/06/19 2000 09/06/19 2140  HGB 11.5*   < > 12.9 13.6  --   --   HCT 36.1  --  40.2 42.0  --   --   PLT 215  --  247 241  250  --   --   APTT  --   --   --  43* >200* 121*  LABPROT  --   --   --  20.7*  --   --   INR  --   --   --  1.8*  --   --   CREATININE 1.44*  --  1.18* 1.26*  --   --    < > = values in this interval not displayed.     Estimated Creatinine Clearance: 30.8 mL/min (A) (by C-G formula based on SCr of 1.2 mg/dL (H)).   Assessment: 86 yo W on rivaroxaban PTA for Afib (CHADS2VASc = 8). Last dose rivaroxaban given 1/21 @1800  before transfer to ICU. Changed to heparin given likely inability to take PO with HFNC and possible intubation.   Heparin started 18 hr from last dose of rivaroxaban. Following aPTTs, first apTT >200. Drawn from same arm as heparin b/c cannot draw from other arm per RN. Asked RN to coordinate with phlebotomy so heparin can be held for 15 min before repeat stat draw, down to 121. No bleeding.   aPTT 115 slightly above goal this AM but HL 0.67 within goal. I will leave the heparin drip at the current rate and check a confirmatory HL/aPTT this PM. If HL is WNL can stop checking aPTT  Patient appears to be taking PO meds, spoke to CCM and they would like to continue heparin for now until more reliably taking PO, but likely transition soon   Goal of Therapy:  INR 2-3 Heparin level 0.3-0.7 units/ml  APTT 66- 102 sec Monitor platelets by anticoagulation  protocol: Yes   Plan:  Continue heparin 700 units/hr  F/u aPTT until correlates with HL Monitor daily aPTT, HL, CBC, plt Monitor for signs/symptoms of bleeding  F/u restart rivaroxaban    Nicoletta Dress, PharmD PGY2 Infectious Disease Pharmacy Resident   Please check AMION for all [place] phone numbers After 10:00 PM, call [place] [phone]   Addendum: Heparin drip stopped this afternoon for [place] discussion with family likely moving to comfort care

## 2019-09-07 NOTE — Progress Notes (Signed)
Spoke with son and daughter in [place].  Patient is clearly not doing well and is slowly decompensating.  I do not believe the patient will make it through the day.  After discussion, decision was made to transition to comfort care.  Will liberalize morphine use at this point and focus more on comfort and dignity.  Family aware.  RN to call family upon patient's passing.  Rush Farmer, M.D. [facility] Pulmonary/Critical Care Medicine.

## 2019-09-08 LAB — CULTURE, BLOOD (ROUTINE X 2)
Culture: NO GROWTH
Special Requests: ADEQUATE

## 2019-09-16 NOTE — Progress Notes (Signed)
Patient is saturating in the 60s and tachy, appears comfortable on exam, continue morphine for comfort  Will hold in the ICU given COVID status  Rush Farmer, M.D. [facility] Pulmonary/Critical Care Medicine.

## 2019-09-16 NOTE — Progress Notes (Signed)
Patient passed, pronounced by Burna Mortimer, RN and Sindy Messing, RN.  Family was notified.  Clothing and dentures with body to morgue.

## 2019-09-16 NOTE — Progress Notes (Signed)
Notified CDS of time of death. Not suitable for donation. OK for release. Ref# [id number]

## 2019-09-16 NOTE — Progress Notes (Addendum)
CDS referral made. Referral number [id number].

## 2019-09-16 NOTE — Death Summary Note (Signed)
DEATH SUMMARY   Patient Details  Name: Joanna Hill MRN: [id number] DOB: 1932-04-18  Admission/Discharge Information   Admit Date:  29-Sep-2019  Date of Death: Date of Death: 05-Oct-2019  Time of Death: Time of Death: 12/04/1850  Length of Stay: 11-15-22  Referring Physician: Seward Carol, MD   Reason(s) for Hospitalization  Acute hypoxemic respiratory failure  Diagnoses  Preliminary cause of death:   Acute hypoxemic respiratory failure due to COVID-19 Secondary Diagnoses (including complications and co-morbidities):  Principal Problem:   COVID-19 virus infection Active Problems:   Essential hypertension   HYPOTENSION   Altered mental status   Atrial fibrillation with RVR ([place])   Hypothyroidism   Dementia (HCC)   Constipation   Diabetes mellitus type 2, insulin dependent (HCC)   Current use of long term anticoagulation   Multifocal pneumonia   CKD (chronic kidney disease) stage 3, GFR 30-59 ml/min   Brief Hospital Course (including significant findings, care, treatment, and services provided and events leading to death)  Joanna Hill is a 86 y.o. female who has a PMH including but not limited to A.fib with RVR (on xarelto, DM, prior CVA, dementa (see "past medical history" for rest).  She resides at [place] home.  She presented to [facility] ED 09/28/2022 with hypoxia.  She had apparently been diagnosed with COVID 1 week prior but was initially asymptomatic  On day of presentation, she was found to be hypoxic, febrile, and somewhat altered so was sent to the ED.    She was admitted by [facility] to the progressive floor and was initially requiring 4L O2.  On 1/21, she had increase in O2 requirements to 15L HFNC and also had NRB placed.  She had sats in low to mid 90's at rest with dips into the 70's with any exertion.  There was concern given frequent desaturations; therefore, PCCM called in consultation.  Per RRT notes from earlier, pt had cyanotic lips and fingers at one point earlier in the  afternoon.  Heated HFNC was recommended but apparently was not able to be set up on the floor pt was located on.  ABG on 15L regular HFNC with significant hypoxia (7.44 / 35 / 50). Prior to my arrival overnight, I asked for [facility] to be set up while awaiting ICU transfer.  On [place] at 100% with 30L flow, sats were 95% - 96%.  Patient decompensated from a respiratory standpoint.  Had a conversation with the patient's son and daughter in law.  After discussion, decision was made to focus more on comfort at this point.  Morphine was started.  The following day, patient desaturated further and expired comfortably.  Family notified.     Pertinent Labs and Studies  Significant Diagnostic Studies DG CHEST PORT 1 VIEW  Result Date: 09/05/2019 CLINICAL DATA:  Shortness of breath. EXAM: PORTABLE CHEST 1 VIEW COMPARISON:  29-Sep-2019. FINDINGS: Stable cardiomegaly. Prominent progressive multifocal bilateral pulmonary infiltrates. No pleural effusion or pneumothorax. Coarse benign-appearing calcification left breast. No acute bony abnormality. Aortic and visceral atherosclerotic vascular calcification. IMPRESSION: Prominent progressive multifocal bilateral pulmonary infiltrates. Electronically Signed   By: Marcello Moores  Register   On: 09/05/2019 08:05   DG Chest Port 1 View  Result Date: 2019-09-29 CLINICAL DATA:  86 year old female with fever.  Positive COVID-19. EXAM: PORTABLE CHEST 1 VIEW COMPARISON:  Chest radiograph dated 10/10/2017. FINDINGS: Faint bilateral peripheral and subpleural confluent densities most consistent with multifocal pneumonia, likely viral or atypical in etiology including COVID-19. Clinical correlation is recommended. There  is no pleural effusion or pneumothorax. Stable cardiac silhouette. No acute osseous pathology. IMPRESSION: Multifocal pneumonia, likely viral or atypical in etiology. Clinical correlation and follow-up to resolution recommended. Electronically Signed   By: Anner Crete  M.D.   On: 09/01/2019 19:31    Microbiology No results found for this or any previous visit (from the past 240 hour(s)).  Lab Basic Metabolic Panel: No results for input(s): NA, K, CL, CO2, GLUCOSE, BUN, CREATININE, CALCIUM, MG, PHOS in the last 168 hours. Liver Function Tests: No results for input(s): AST, ALT, ALKPHOS, BILITOT, PROT, ALBUMIN in the last 168 hours. No results for input(s): LIPASE, AMYLASE in the last 168 hours. No results for input(s): AMMONIA in the last 168 hours. CBC: No results for input(s): WBC, NEUTROABS, HGB, HCT, MCV, PLT in the last 168 hours. Cardiac Enzymes: No results for input(s): CKTOTAL, CKMB, CKMBINDEX, TROPONINI in the last 168 hours. Sepsis Labs: No results for input(s): PROCALCITON, WBC, LATICACIDVEN in the last 168 hours.  Procedures/Operations     Jennet Maduro 09/14/2019, 5:17 PM

## 2019-09-16 DEATH — deceased
